# Patient Record
Sex: Female | Born: 1935
Health system: Southern US, Community
[De-identification: ages and names within clinical notes are randomized; demographics above are authoritative.]

## PROBLEM LIST (undated history)

## (undated) DIAGNOSIS — I10 Essential (primary) hypertension: Secondary | ICD-10-CM

## (undated) DIAGNOSIS — Z8619 Personal history of other infectious and parasitic diseases: Secondary | ICD-10-CM

## (undated) DIAGNOSIS — R351 Nocturia: Secondary | ICD-10-CM

## (undated) DIAGNOSIS — R3915 Urgency of urination: Secondary | ICD-10-CM

## (undated) DIAGNOSIS — E559 Vitamin D deficiency, unspecified: Secondary | ICD-10-CM

## (undated) DIAGNOSIS — E785 Hyperlipidemia, unspecified: Secondary | ICD-10-CM

## (undated) DIAGNOSIS — R102 Pelvic and perineal pain unspecified side: Secondary | ICD-10-CM

## (undated) DIAGNOSIS — M1990 Unspecified osteoarthritis, unspecified site: Secondary | ICD-10-CM

## (undated) DIAGNOSIS — Z973 Presence of spectacles and contact lenses: Secondary | ICD-10-CM

## (undated) DIAGNOSIS — Z860100 Personal history of colon polyps, unspecified: Secondary | ICD-10-CM

## (undated) DIAGNOSIS — M8580 Other specified disorders of bone density and structure, unspecified site: Secondary | ICD-10-CM

## (undated) DIAGNOSIS — R413 Other amnesia: Secondary | ICD-10-CM

## (undated) DIAGNOSIS — J309 Allergic rhinitis, unspecified: Secondary | ICD-10-CM

## (undated) DIAGNOSIS — I6523 Occlusion and stenosis of bilateral carotid arteries: Secondary | ICD-10-CM

## (undated) DIAGNOSIS — I480 Paroxysmal atrial fibrillation: Secondary | ICD-10-CM

## (undated) DIAGNOSIS — K219 Gastro-esophageal reflux disease without esophagitis: Secondary | ICD-10-CM

## (undated) DIAGNOSIS — K5790 Diverticulosis of intestine, part unspecified, without perforation or abscess without bleeding: Secondary | ICD-10-CM

## (undated) DIAGNOSIS — R350 Frequency of micturition: Secondary | ICD-10-CM

## (undated) DIAGNOSIS — Z8673 Personal history of transient ischemic attack (TIA), and cerebral infarction without residual deficits: Secondary | ICD-10-CM

## (undated) DIAGNOSIS — E611 Iron deficiency: Secondary | ICD-10-CM

## (undated) DIAGNOSIS — N3010 Interstitial cystitis (chronic) without hematuria: Secondary | ICD-10-CM

## (undated) HISTORY — PX: URETEROLITHOTOMY: SHX71

## (undated) HISTORY — DX: Iron deficiency: E61.1

## (undated) HISTORY — DX: Diverticulosis of intestine, part unspecified, without perforation or abscess without bleeding: K57.90

## (undated) HISTORY — DX: Other specified disorders of bone density and structure, unspecified site: M85.80

## (undated) HISTORY — DX: Vitamin D deficiency, unspecified: E55.9

## (undated) HISTORY — DX: Allergic rhinitis, unspecified: J30.9

## (undated) HISTORY — PX: OTHER SURGICAL HISTORY: SHX169

## (undated) HISTORY — PX: ABDOMINAL HYSTERECTOMY: SHX81

---

## 1997-07-25 ENCOUNTER — Emergency Department (HOSPITAL_COMMUNITY): Admission: EM | Admit: 1997-07-25 | Discharge: 1997-07-25 | Payer: Self-pay | Admitting: Emergency Medicine

## 1999-09-15 ENCOUNTER — Emergency Department (HOSPITAL_COMMUNITY): Admission: EM | Admit: 1999-09-15 | Discharge: 1999-09-15 | Payer: Self-pay | Admitting: Emergency Medicine

## 1999-09-15 ENCOUNTER — Encounter: Payer: Self-pay | Admitting: Emergency Medicine

## 2003-01-25 ENCOUNTER — Encounter: Payer: Self-pay | Admitting: Internal Medicine

## 2003-02-01 ENCOUNTER — Encounter: Payer: Self-pay | Admitting: Internal Medicine

## 2004-11-24 ENCOUNTER — Ambulatory Visit (HOSPITAL_COMMUNITY): Admission: RE | Admit: 2004-11-24 | Discharge: 2004-11-24 | Payer: Self-pay | Admitting: Urology

## 2004-11-24 ENCOUNTER — Ambulatory Visit (HOSPITAL_BASED_OUTPATIENT_CLINIC_OR_DEPARTMENT_OTHER): Admission: RE | Admit: 2004-11-24 | Discharge: 2004-11-24 | Payer: Self-pay | Admitting: Urology

## 2006-04-15 ENCOUNTER — Ambulatory Visit (HOSPITAL_BASED_OUTPATIENT_CLINIC_OR_DEPARTMENT_OTHER): Admission: RE | Admit: 2006-04-15 | Discharge: 2006-04-15 | Payer: Self-pay | Admitting: Urology

## 2006-04-15 HISTORY — PX: OTHER SURGICAL HISTORY: SHX169

## 2006-05-23 ENCOUNTER — Emergency Department (HOSPITAL_COMMUNITY): Admission: EM | Admit: 2006-05-23 | Discharge: 2006-05-23 | Payer: Self-pay | Admitting: Emergency Medicine

## 2006-05-23 ENCOUNTER — Encounter (INDEPENDENT_AMBULATORY_CARE_PROVIDER_SITE_OTHER): Payer: Self-pay | Admitting: *Deleted

## 2006-05-28 ENCOUNTER — Observation Stay (HOSPITAL_COMMUNITY): Admission: EM | Admit: 2006-05-28 | Discharge: 2006-05-29 | Payer: Self-pay | Admitting: Emergency Medicine

## 2007-01-06 HISTORY — PX: CATARACT EXTRACTION W/ INTRAOCULAR LENS  IMPLANT, BILATERAL: SHX1307

## 2007-01-17 ENCOUNTER — Encounter: Payer: Self-pay | Admitting: Internal Medicine

## 2007-01-17 ENCOUNTER — Ambulatory Visit (HOSPITAL_COMMUNITY): Admission: RE | Admit: 2007-01-17 | Discharge: 2007-01-17 | Payer: Self-pay | Admitting: Internal Medicine

## 2007-08-09 ENCOUNTER — Ambulatory Visit (HOSPITAL_COMMUNITY): Admission: RE | Admit: 2007-08-09 | Discharge: 2007-08-09 | Payer: Self-pay | Admitting: Internal Medicine

## 2007-12-06 ENCOUNTER — Telehealth: Payer: Self-pay | Admitting: Internal Medicine

## 2007-12-07 ENCOUNTER — Ambulatory Visit: Payer: Self-pay | Admitting: Gastroenterology

## 2007-12-07 DIAGNOSIS — R143 Flatulence: Secondary | ICD-10-CM

## 2007-12-07 DIAGNOSIS — K625 Hemorrhage of anus and rectum: Secondary | ICD-10-CM

## 2007-12-07 DIAGNOSIS — Z860100 Personal history of colon polyps, unspecified: Secondary | ICD-10-CM | POA: Insufficient documentation

## 2007-12-07 DIAGNOSIS — R141 Gas pain: Secondary | ICD-10-CM

## 2007-12-07 DIAGNOSIS — K644 Residual hemorrhoidal skin tags: Secondary | ICD-10-CM | POA: Insufficient documentation

## 2007-12-07 DIAGNOSIS — R634 Abnormal weight loss: Secondary | ICD-10-CM

## 2007-12-07 DIAGNOSIS — K219 Gastro-esophageal reflux disease without esophagitis: Secondary | ICD-10-CM | POA: Insufficient documentation

## 2007-12-07 DIAGNOSIS — K5900 Constipation, unspecified: Secondary | ICD-10-CM | POA: Insufficient documentation

## 2007-12-07 DIAGNOSIS — R110 Nausea: Secondary | ICD-10-CM

## 2007-12-07 DIAGNOSIS — R12 Heartburn: Secondary | ICD-10-CM

## 2007-12-07 DIAGNOSIS — R142 Eructation: Secondary | ICD-10-CM

## 2007-12-14 ENCOUNTER — Ambulatory Visit: Payer: Self-pay | Admitting: Internal Medicine

## 2007-12-16 ENCOUNTER — Encounter: Payer: Self-pay | Admitting: Internal Medicine

## 2007-12-21 ENCOUNTER — Telehealth: Payer: Self-pay | Admitting: Internal Medicine

## 2007-12-29 ENCOUNTER — Emergency Department (HOSPITAL_COMMUNITY): Admission: EM | Admit: 2007-12-29 | Discharge: 2007-12-29 | Payer: Self-pay | Admitting: Emergency Medicine

## 2008-01-17 DIAGNOSIS — K209 Esophagitis, unspecified without bleeding: Secondary | ICD-10-CM | POA: Insufficient documentation

## 2008-01-17 DIAGNOSIS — K648 Other hemorrhoids: Secondary | ICD-10-CM | POA: Insufficient documentation

## 2008-01-17 DIAGNOSIS — K5730 Diverticulosis of large intestine without perforation or abscess without bleeding: Secondary | ICD-10-CM | POA: Insufficient documentation

## 2008-01-17 DIAGNOSIS — D126 Benign neoplasm of colon, unspecified: Secondary | ICD-10-CM

## 2008-01-18 ENCOUNTER — Ambulatory Visit: Payer: Self-pay | Admitting: Internal Medicine

## 2008-01-18 DIAGNOSIS — R1013 Epigastric pain: Secondary | ICD-10-CM

## 2008-01-18 DIAGNOSIS — K3189 Other diseases of stomach and duodenum: Secondary | ICD-10-CM

## 2008-10-04 ENCOUNTER — Ambulatory Visit (HOSPITAL_BASED_OUTPATIENT_CLINIC_OR_DEPARTMENT_OTHER): Admission: RE | Admit: 2008-10-04 | Discharge: 2008-10-04 | Payer: Self-pay | Admitting: Urology

## 2009-03-26 DIAGNOSIS — D649 Anemia, unspecified: Secondary | ICD-10-CM | POA: Insufficient documentation

## 2009-05-02 ENCOUNTER — Ambulatory Visit (HOSPITAL_BASED_OUTPATIENT_CLINIC_OR_DEPARTMENT_OTHER): Admission: RE | Admit: 2009-05-02 | Discharge: 2009-05-02 | Payer: Self-pay | Admitting: Urology

## 2009-09-11 ENCOUNTER — Encounter (INDEPENDENT_AMBULATORY_CARE_PROVIDER_SITE_OTHER): Payer: Self-pay | Admitting: *Deleted

## 2009-09-17 ENCOUNTER — Observation Stay (HOSPITAL_COMMUNITY): Admission: RE | Admit: 2009-09-17 | Discharge: 2009-09-18 | Payer: Self-pay | Admitting: Urology

## 2009-09-17 HISTORY — PX: OTHER SURGICAL HISTORY: SHX169

## 2009-09-18 ENCOUNTER — Encounter (INDEPENDENT_AMBULATORY_CARE_PROVIDER_SITE_OTHER): Payer: Self-pay | Admitting: *Deleted

## 2010-01-03 ENCOUNTER — Telehealth: Payer: Self-pay | Admitting: Internal Medicine

## 2010-01-25 ENCOUNTER — Encounter: Payer: Self-pay | Admitting: Internal Medicine

## 2010-02-03 ENCOUNTER — Encounter: Payer: Self-pay | Admitting: Internal Medicine

## 2010-02-03 ENCOUNTER — Ambulatory Visit
Admission: RE | Admit: 2010-02-03 | Discharge: 2010-02-03 | Payer: Self-pay | Source: Home / Self Care | Attending: Internal Medicine | Admitting: Internal Medicine

## 2010-02-03 DIAGNOSIS — R109 Unspecified abdominal pain: Secondary | ICD-10-CM | POA: Insufficient documentation

## 2010-02-04 ENCOUNTER — Ambulatory Visit (HOSPITAL_COMMUNITY)
Admission: RE | Admit: 2010-02-04 | Discharge: 2010-02-04 | Payer: Self-pay | Source: Home / Self Care | Attending: Internal Medicine | Admitting: Internal Medicine

## 2010-02-06 NOTE — Progress Notes (Signed)
Summary: Sooner appt.  Phone Note Call from Patient Call back at [facility] Phone [phone]   Caller: Patient Call For: Dr. Marina Goodell Reason for Call: Talk to Nurse Summary of Call: having increased acid reflux...requesting sooner appt than 02-03-10 Initial call taken by: Karna Christmas,  January 03, 2010 11:39 AM  Follow-up for Phone Call        Spoke with patient to informed her she is scheduled for 02/03/10 @ 0830, but I will place her on Dr [facility] waiting list. Patient stated understanding. Follow-up by: Graciella Freer RN,  January 03, 2010 2:39 PM

## 2010-02-06 NOTE — Progress Notes (Signed)
Summary: "increased reflx"  Phone Note Call from Patient Call back at Home Phone [phone]   Caller: Patient Call For: Dr. Marina Goodell Reason for Call: Talk to Nurse Summary of Call: Burning and acid reflux....would like to discuss Initial call taken by: Karna Christmas,  January 03, 2010 8:19 AM  Follow-up for Phone Call        Dr.Perini put pt. on Protonix q.a.m. but it is not helping reflux.She stopped the Protonix and tried Zantac and it did not work so she resumed Protonix.Reflux increases  throughout the day but does   not bother her at night. Follow-up by: Teryl Lucy RN,  January 03, 2010 8:57 AM  Additional Follow-up for Phone Call Additional follow up Details #1::        Not sure that she even has reflux. Can make a routine f/u OV if she would like Additional Follow-up by: Hilarie Fredrickson MD,  January 03, 2010 9:30 AM    Additional Follow-up for Phone Call Additional follow up Details #2::    Given r.o.v. appt. Follow-up by: Teryl Lucy RN,  January 03, 2010 10:03 AM

## 2010-02-10 ENCOUNTER — Other Ambulatory Visit (AMBULATORY_SURGERY_CENTER): Payer: Medicare Other | Admitting: Internal Medicine

## 2010-02-10 ENCOUNTER — Telehealth (INDEPENDENT_AMBULATORY_CARE_PROVIDER_SITE_OTHER): Payer: Self-pay | Admitting: *Deleted

## 2010-02-10 ENCOUNTER — Encounter: Payer: Self-pay | Admitting: Internal Medicine

## 2010-02-10 DIAGNOSIS — K219 Gastro-esophageal reflux disease without esophagitis: Secondary | ICD-10-CM

## 2010-02-10 DIAGNOSIS — R109 Unspecified abdominal pain: Secondary | ICD-10-CM

## 2010-02-11 ENCOUNTER — Telehealth (INDEPENDENT_AMBULATORY_CARE_PROVIDER_SITE_OTHER): Payer: Self-pay | Admitting: *Deleted

## 2010-02-12 ENCOUNTER — Encounter: Payer: Self-pay | Admitting: Internal Medicine

## 2010-02-12 ENCOUNTER — Other Ambulatory Visit: Payer: Self-pay | Admitting: Internal Medicine

## 2010-02-12 NOTE — Assessment & Plan Note (Signed)
Summary: Abdominal burning   History of Present Illness Visit Type: Follow-up Consult Primary GI MD: Yancey Flemings MD Primary Provider: Rodrigo Ran, MD Requesting Provider: Rodrigo Ran, MD Chief Complaint: Patient c/o 2-3 months frequent reflux. She c/o excessive belching and epigastric burning. She c/o upper abdominal tenderness. History of Present Illness:   77 year old female with hypertension, cerebrovascular disease, kidney stones, anxiety disorder, and GERD. She presents today regarding "burning". She is accompanied by her daughter. The patient was seen in the office 2009/12/14for vague complaints of burning sensation, bloating, and constipation. She subsequently underwent colonoscopy and upper endoscopy. Colonoscopy revealed diverticulosis. MiraLax recommended for constipation. Upper endoscopy revealed mild gastritis. H. pylori positive, treated with Prevpac x1 week(stopped prematurely due to "funny feeling"). She now reports a several month history of burning sensation that begins in the chest and migrates to the abdomen and eventually resulting in dysuria. She sees a urologist for "cystitis". She states that the discomfort is worse after meals. She has lost 7 pounds since 12/19/22. Her husband died this past 09-19-2022. She also complains of bloating and belching. Constipation continues, but is managed with MiraLax. No bleeding. When asked about anxiety and depression, they state that she may have these symptoms due to not feeling well. She's been on multiple PPI is without improvement until most recently when Dexilant seems to help some. No dysphagia. Laboratories from 2022-09-19 revealed a normal basic embolic pellets of for glucose of 146 and normal CBC except for platelets 129,000.   GI Review of Systems    Reports abdominal pain, acid reflux, and  belching.     Location of  Abdominal pain: upper abdomen.    Denies bloating, chest pain, dysphagia with liquids, dysphagia with solids,  heartburn, loss of appetite, nausea, vomiting, vomiting blood, weight loss, and  weight gain.      Reports constipation.     Denies anal fissure, black tarry stools, change in bowel habit, diarrhea, diverticulosis, fecal incontinence, heme positive stool, hemorrhoids, irritable bowel syndrome, jaundice, light color stool, liver problems, rectal bleeding, and  rectal pain. Preventive Screening-Counseling & Management  Caffeine-Diet-Exercise     Does Patient Exercise: no    Current Medications (verified): 1)  Avapro 75 Mg Tabs (Irbesartan) .... Once Daily 2)  Premarin 1.25 Mg Tabs (Estrogens Conjugated) .... Once Daily 3)  Vitamin D 56387 Unit Caps (Ergocalciferol) .... Take 1 Tablet Weekly As Needed 4)  Vesicare 10 Mg Tabs (Solifenacin Succinate) .... Take 1 Tablet 5)  Dexilant 60 Mg Cpdr (Dexlansoprazole) .... Take 1 Tablet By Mouth Once A Day 6)  Phenazopyridine Hcl 200 Mg Tabs (Phenazopyridine Hcl) .... Take 1 Tablet By Mouth Every 6 Hours As Needed 7)  Lorazepam 1 Mg Tabs (Lorazepam) .... Take 1 Tablet By Mouth At Bedtime 8)  Tylenol Pm Extra Strength 500-25 Mg Tabs (Diphenhydramine-Apap (Sleep)) .... Take 1 Tab By Mouth At Bedtime As Needed For Sleep  Allergies (verified): 1)  ! * Gabapentin  Past History:  Past Medical History: Anxiety Disorder Hypertension Kidney Stones Stroke GERD  Past Surgical History: Hysterectomy Kidney stone removal Eye Surgery-Left  Social History: Occupation: Retired Patient has never smoked.  Alcohol Use - no Illicit Drug Use - no Patient does not get regular exercise.  Does Patient Exercise:  no  Review of Systems       The patient complains of allergy/sinus, sleeping problems, swelling of feet/legs, urination changes/pain, and voice change.  The patient denies anemia, anxiety-new, arthritis/joint pain, back pain, blood in urine, breast  changes/lumps, change in vision, confusion, cough, coughing up blood, depression-new, fainting,  fatigue, fever, headaches-new, hearing problems, heart murmur, heart rhythm changes, itching, menstrual pain, muscle pains/cramps, night sweats, pregnancy symptoms, shortness of breath, skin rash, sore throat, swollen lymph glands, thirst - excessive , urination - excessive , urine leakage, and vision changes.    Vital Signs:  Patient profile:   77 year old female Height:      64 inches Weight:      128.13 pounds BMI:     22.07 BSA:     1.62 Pulse rate:   84 / minute Pulse rhythm:   regular BP sitting:   132 / 76  (left arm)  Vitals Entered By: Lamona Curl CMA (AAMA) (February 03, 2010 10:12 AM)   Physical Exam  General:  Well developed, well nourished, no acute distress. Head:  Normocephalic and atraumatic. Eyes:  PERRLA, no icterus. Mouth:  No deformity or lesions. Neck:  Supple; no masses or thyromegaly. Lungs:  Clear throughout to auscultation. Heart:  Regular rate and rhythm; no murmurs, rubs,  or bruits. Abdomen:  Soft, nontender and nondistended. No masses, hepatosplenomegaly or hernias noted. Normal bowel sounds. Msk:  Symmetrical with no gross deformities. Normal posture. Pulses:  Normal pulses noted. Extremities:  No clubbing, cyanosis, edema or deformities noted. Neurologic:  Alert and  oriented x4. Skin:  Intact without significant lesions or rashes. Psych:  Alert and cooperative. Normal mood and affect.   Impression & Recommendations:  Problem # 1:  ABDOMINAL PAIN, UNSPECIFIED SITE (ICD-789.00) vague abdominal burning sensation, worse with meals. Etiology unclear. Maybe somewhat improved on Dexilant. Recent weight loss associated with food avoidance.  Plan: #1. Abdominal ultrasound to rule out gallstones #2. Upper endoscopy rule out gastric ulcer #3. Continue Dexilant as this seems to be helping some #4. Consider contrast enhanced CT scan with fine cuts through the pancreas if the above workup negative and symptoms continue  Problem # 2:  CONSTIPATION  (ICD-564.00) Assessment: Unchanged continue MiraLax p.r.n.  Problem # 3:  ADENOMATOUS COLONIC POLYP (ICD-211.3) negative most recent followup examination December 2009. Due for followup around December 2014 if medically fit  Other Orders: EGD (EGD) Ultrasound Abdomen (UAS)  Patient Instructions: 1)  EGD [facility] 02/10/10 10:30 am arrive at 9:30 am 2)  Upper Endoscopy brochure given.  3)  Abdominal Ultrasound scheduled at [facility] 02/04/10 9:00 am arrive at 8:45 am. 4)  Nothing to eat or drink after midnight. 5)  Copy sent to : Rodrigo Ran, MD 6)  The medication list was reviewed and reconciled.  All changed / newly prescribed medications were explained.  A complete medication list was provided to the patient / caregiver.

## 2010-02-12 NOTE — Consult Note (Signed)
Summary: [facility]   Imported By: Sherian Rein 02/04/2010 10:06:45  _____________________________________________________________________  External Attachment:    Type:   Image     Comment:   External Document

## 2010-02-12 NOTE — Letter (Signed)
Summary: EGD Instructions  [facility]  [address] [place], [place] [address]   Phone: [phone]  Fax: [phone]       Christine [name]    04-Jun-1933    MRN: [id number]       Procedure Day /Date:MONDAY, 02/10/10     Arrival Time: 9:30 AM     Procedure Time:10:30 AM     Location of Procedure:                    [facility] (4th Floor)   PREPARATION FOR ENDOSCOPY   On 02/10/10 THE DAY OF THE PROCEDURE:  1.   No solid foods, milk or milk products are allowed after midnight the night before your procedure.  2.   Do not drink anything colored red or purple.  Avoid juices with pulp.  No orange juice.  3.  You may drink clear liquids until 8:30 AM, which is 2 hours before your procedure.                                                                                                CLEAR LIQUIDS INCLUDE: Water Jello Ice Popsicles Tea (sugar ok, no milk/cream) Powdered fruit flavored drinks Coffee (sugar ok, no milk/cream) Gatorade Juice: apple, white grape, white cranberry  Lemonade Clear bullion, consomm, broth Carbonated beverages (any kind) Strained chicken noodle soup Hard Candy   MEDICATION INSTRUCTIONS  Unless otherwise instructed, you should take regular prescription medications with a small sip of water as early as possible the morning of your procedure.          OTHER INSTRUCTIONS  You will need a responsible adult at least 75 years of age to accompany you and drive you home.   This person must remain in the waiting room during your procedure.  Wear loose fitting clothing that is easily removed.  Leave jewelry and other valuables at home.  However, you may wish to bring a book to read or an iPod/MP3 player to listen to music as you wait for your procedure to start.  Remove all body piercing jewelry and leave at home.  Total time from sign-in until discharge is approximately 2-3 hours.  You should go home directly after  your procedure and rest.  You can resume normal activities the day after your procedure.  The day of your procedure you should not:   Drive   Make legal decisions   Operate machinery   Drink alcohol   Return to work  You will receive specific instructions about eating, activities and medications before you leave.    The above instructions have been reviewed and explained to me by   _______________________    I fully understand and can verbalize these instructions _____________________________ Date _________

## 2010-02-18 ENCOUNTER — Other Ambulatory Visit: Payer: Self-pay | Admitting: Internal Medicine

## 2010-02-18 ENCOUNTER — Other Ambulatory Visit: Payer: Medicare Other

## 2010-02-18 ENCOUNTER — Encounter (INDEPENDENT_AMBULATORY_CARE_PROVIDER_SITE_OTHER): Payer: Self-pay | Admitting: *Deleted

## 2010-02-18 ENCOUNTER — Ambulatory Visit (INDEPENDENT_AMBULATORY_CARE_PROVIDER_SITE_OTHER)
Admission: RE | Admit: 2010-02-18 | Discharge: 2010-02-18 | Disposition: A | Discharge status: Home / Self Care | Payer: Medicare Other | Source: Ambulatory Visit | Attending: Internal Medicine | Admitting: Internal Medicine

## 2010-02-18 DIAGNOSIS — R109 Unspecified abdominal pain: Secondary | ICD-10-CM

## 2010-02-18 MED ORDER — IOHEXOL 300 MG/ML  SOLN
100.0000 mL | Freq: Once | INTRAMUSCULAR | Status: AC | PRN
  Administered 2010-02-18: 90 mL via INTRAVENOUS

## 2010-02-20 NOTE — Miscellaneous (Signed)
Summary: C.T.  order for with and without contrast  Clinical Lists Changes  Orders: Added new Test order of CT Abdomen/Pelvis w & w/o Contrast (CT A/P w&w/o Cont) - Signed

## 2010-02-20 NOTE — Procedures (Signed)
Summary: Endoscopy   EGD  Procedure date:  02/10/2010  Findings:      Location: [facility]   ENDOSCOPY PROCEDURE REPORT  PATIENT:  Christine [name], Christine [name]  MR#:  [id number] BIRTHDATE:   December 01, 1933, 76 yrs. old   GENDER:   female  ENDOSCOPIST:   Wilhemina Bonito. Eda Keys, MD Referred by: Office   PROCEDURE DATE:  02/10/2010 PROCEDURE:  EGD, diagnostic 62952 ASA CLASS:   Class II INDICATIONS: abdominal pain   MEDICATIONS:    Fentanyl 50 mcg IV, Versed 6 mg IV TOPICAL ANESTHETIC:   Exactacain Spray  DESCRIPTION OF PROCEDURE:   After the risks benefits and alternatives of the procedure were thoroughly explained, informed consent was obtained.  The LB [id number] endoscope was introduced through the mouth and advanced to the second portion of the duodenum, without limitations.  The instrument was slowly withdrawn as the mucosa was fully examined. <<PROCEDUREIMAGES>>          <<OLD IMAGES>>  The upper, middle, and distal third of the esophagus were carefully inspected and no abnormalities were noted. The z-line was well seen at the GEJ. The endoscope was pushed into the fundus which was normal including a retroflexed view. The antrum,gastric body, first and second part of the duodenum were unremarkable.    Retroflexed views revealed no abnormalities.    The scope was then withdrawn from the patient and the procedure completed.  COMPLICATIONS:   None ENDOSCOPIC IMPRESSION:  1) Normal EGD  2) GERD  RECOMMENDATIONS:  1) Continue Dexilant  2) My office will arrange for you to have a contrast enhanced CT scan of your abdomen and pelvis with fine cuts through the pancreas "evaluate abdominal pain and weight loss".  _______________________________ Wilhemina Bonito. Eda Keys, MD    CC: Rodrigo Ran, MD; The Patient

## 2010-02-20 NOTE — Progress Notes (Signed)
  Phone Note Outgoing Call   Action Taken: Information Sent Reason for Call: Get patient information Details for Reason: Phone # of carepartner Summary of Call: Please call Gunnar Fusi..the patient's caretaker when scheduling CT as she needs to plan her schedule around it.    It is [phone]. Initial call taken by: Clide Cliff RN,  February 10, 2010 11:37 AM

## 2010-02-20 NOTE — Progress Notes (Signed)
  Phone Note Outgoing Call   Summary of Call: C.T.ordered after procedure yesterday and message left for pt's. caregiver Gunnar Fusi at [phone] to please call me back to schedule the c.t. Initial call taken by: Teryl Lucy RN,  February 11, 2010 10:06 AM  Follow-up for Phone Call        [name] (daughter) returning Taimur Fier's call from today. Going out of town on [place]. Call her back at [phone] Follow-up by: Karna Christmas,  February 11, 2010 4:00 PM  Additional Follow-up for Phone Call Additional follow up Details #1::        Pt. scheduled for C.T. of abd./pelvis at [facility] on2/14/12 with pancreatic protocol.Appt. is at 1:30 .Will have to be there at 1:00 p.m. N.p.o. 4 hrs. prior.Will need stat bun/creat. in the a.m. caregiver Gunnar Fusi ntfd. and instructed. Additional Follow-up by: Teryl Lucy RN,  February 12, 2010 9:09 AM

## 2010-03-03 DIAGNOSIS — R209 Unspecified disturbances of skin sensation: Secondary | ICD-10-CM | POA: Insufficient documentation

## 2010-03-20 LAB — ABO/RH: ABO/RH(D): A POS

## 2010-03-20 LAB — CBC
HCT: 40 % (ref 36.0–46.0)
Hemoglobin: 13.4 g/dL (ref 12.0–15.0)
MCH: 33 pg (ref 26.0–34.0)
MCHC: 33.5 g/dL (ref 30.0–36.0)
MCV: 98.5 fL (ref 78.0–100.0)

## 2010-03-20 LAB — TYPE AND SCREEN
ABO/RH(D): A POS
Antibody Screen: NEGATIVE

## 2010-03-20 LAB — BASIC METABOLIC PANEL
BUN: 11 mg/dL (ref 6–23)
BUN: 11 mg/dL (ref 6–23)
BUN: 15 mg/dL (ref 6–23)
CO2: 30 mEq/L (ref 19–32)
Calcium: 8.5 mg/dL (ref 8.4–10.5)
Chloride: 102 mEq/L (ref 96–112)
Chloride: 106 mEq/L (ref 96–112)
Chloride: 110 mEq/L (ref 96–112)
Creatinine, Ser: 1 mg/dL (ref 0.4–1.2)
Glucose, Bld: 146 mg/dL — ABNORMAL HIGH (ref 70–99)
Glucose, Bld: 96 mg/dL (ref 70–99)
Potassium: 4.1 mEq/L (ref 3.5–5.1)
Potassium: 4.4 mEq/L (ref 3.5–5.1)
Sodium: 137 mEq/L (ref 135–145)

## 2010-03-20 LAB — PROTIME-INR
INR: 0.95 (ref 0.00–1.49)
Prothrombin Time: 12.9 seconds (ref 11.6–15.2)

## 2010-03-20 LAB — APTT: aPTT: 30 seconds (ref 24–37)

## 2010-03-20 LAB — SURGICAL PCR SCREEN: MRSA, PCR: NEGATIVE

## 2010-03-20 LAB — HEMOGLOBIN AND HEMATOCRIT, BLOOD
HCT: 35.4 % — ABNORMAL LOW (ref 36.0–46.0)
Hemoglobin: 10.4 g/dL — ABNORMAL LOW (ref 12.0–15.0)

## 2010-03-25 LAB — POCT I-STAT 4, (NA,K, GLUC, HGB,HCT)
Glucose, Bld: 85 mg/dL (ref 70–99)
Potassium: 4.4 mEq/L (ref 3.5–5.1)

## 2010-04-11 LAB — POCT I-STAT 4, (NA,K, GLUC, HGB,HCT)
Hemoglobin: 12.9 g/dL (ref 12.0–15.0)
Sodium: 138 mEq/L (ref 135–145)

## 2010-05-20 NOTE — Op Note (Signed)
NAME:  [name], Christine               ACCOUNT NO.:  [id number]   MEDICAL RECORD NO.:  [id number]          PATIENT TYPE:  AMB   LOCATION:  DAY                          FACILITY:  [facility]   PHYSICIAN:  Sigmund I. Patsi Sears, M.D.DATE OF BIRTH:  12-23-1933   DATE OF PROCEDURE:  DATE OF DISCHARGE:  05/29/2006                               OPERATIVE REPORT   PREOPERATIVE DIAGNOSIS:  Right ureteral stone.   POSTOPERATIVE DIAGNOSIS:  Right ureteral stone.   OPERATION:  1. Cystourethroscopy.  2. Right retrograde pyelogram with interpretation.  3. Right ureteroscopy.  4. Basket extraction, right ureteral calculus.   SURGEON:  Sigmund I. Patsi Sears, M.D.   ANESTHESIA:  General LMA.   PREPARATION:  After appropriate preanesthesia, the patient was brought  to the operating room and placed on the operating table in the dorsal  supine position, where general LMA anesthesia was introduced.  She was  then replaced in dorsal lithotomy position, where the pubis was prepped  with Betadine solution and draped in the usual fashion.   REVIEW OF HISTORY:  Ms. Armstead is a 77 year old female with known right  ureteral calculus, which she has not able to pass.  Because of intense  right ureteral colic, the patient was admitted for observation to [facility] and is now for ureteroscopic basket extraction of stone  this morning.   PROCEDURE:  With the patient in supine position, general LMA anesthesia  was administered.  The patient was then replaced in dorsal lithotomy  position, where the pubis was prepped with Betadine solution and draped  in the usual fashion.   Cystourethroscopy was accomplished and shows a normal-appearing bladder  with an edematous right ureteral orifice and right hemitrigone.  The  right orifice was identified and a right retrograde pyelogram was  performed with no definite identification of stone, even under  magnification.  Right ureteroscopy was then accomplished  with the short  ureteroscope.  It is noted on retrograde pyelography that there was some  dilation of the upper ureter.  Ureteroscopy was accomplished and showed  that there was definite edema in the intramural portion of the ureter  and some blood-streaked ureter was also identified.  Mid ureter  ureteroscopy to upper ureter ureteroscopy yielded the stone, which  appeared as a spur, measuring 2-3 mm.  This was basket-extracted using  the ureteroscope.  It was elected to give the patient Toradol but to  avoid a double-J catheterization because once ureteroscope was  negotiated past the edematous area of the intramural ureter, the  ureteroscope passed freely.  Repeat ureteroscopy was  accomplished to ensure that there were no further stones, and none were  identified.  The ureteroscope passed freely past the intramural portion  of the ureter at this time.   The patient was awakened, given IV Toradol, and taken to the recovery  room in good condition.      Sigmund I. Patsi Sears, M.D.  Electronically Signed     SIT/MEDQ  D:  05/29/2006  T:  05/29/2006  Job:  [id number]   cc:   Bertram Millard. Dahlstedt, M.D.  Fax:  [phone]

## 2010-05-20 NOTE — H&P (Signed)
NAME:  Christine [name], Christine [name]               ACCOUNT NO.:  [id number]   MEDICAL RECORD NO.:  [id number]          PATIENT TYPE:  INP   LOCATION:  5741                         FACILITY:  [facility]   PHYSICIAN:  Bertram Millard. Dahlstedt, M.D.DATE OF BIRTH:  1933/10/28   DATE OF ADMISSION:  05/28/2006  DATE OF DISCHARGE:                              HISTORY & PHYSICAL   REASON FOR ADMISSION:  Kidney stone.   BRIEF HISTORY:  [age]-old female with a prior history of  urolithiasis, admitted this morning for persistent pain due to her right  distal ureteral stone.  She had been having intermittent pain for quite  a few weeks.  She has been on Flomax for medical expulsive therapy  with this not working.  She has had 2 emergency room visits over the  past week or so, and presents at this time for admission for pain  management.   PAST MEDICAL HISTORY:  Significant for:  1. Prior stone extractions.  2. A left ureteroscopy, which failed to show any stones within the      past 4 months.  3. Hypertension.  4. She has had a hysterectomy.  5. She has hypercholesterolemia.   MEDICATIONS:  Include Avapro, Cipro, Flomax, oxycodone, Premarin,  Vicodin, p.r.n. Zocor.   NO KNOWN DRUG ALLERGIES.   REVIEW OF SYSTEMS:  Is significant for intermittent flank pain.  No  fevers or chills.  She has intermittent low urinary tract symptoms.  No  gross hematuria.  No fever or chills.   PHYSICAL EXAMINATION:  GENERAL:  Revealed a pleasant, elderly female.  At the time of this examination, she was comfortable reclining.  VITAL SIGNS:  Blood pressure 149/80.  Pulse 73.  Temperature afebrile.  LUNGS:  Clear.  HEART:  Normal rate and rhythm.  ABDOMEN:  Mild right lower quadrant tenderness.  No rebound or guarding.  SKIN:  Warm and dry.   BMET revealed a glucose of 111, but otherwise normal.  Hemoglobin 15.0.  Urinalysis, large blood.  No evidence of infection.   I reviewed the patient's CT urogram from earlier  and the KUB.  This  shows a 1-2 mm right distal ureteral stone with moderate hydronephrosis.  There were no other renal or ureteral calculi noted.   IMPRESSION:  Persistent right ureteral calculus.   PLAN:  1. Admit for pain management.  2. I have talked with the patient this evening, and at this point,      with a longstanding history of the stone not passing, even with      medical expulsive therapy, we will proceed with ureteroscopy and      stone extraction in the morning.  3. We will get this arranged to be done at [facility], with the      [facility] set to transport the patient.  She should be able to go      home directly after that procedure.      Bertram Millard. Dahlstedt, M.D.  Electronically Signed     SMD/MEDQ  D:  05/28/2006  T:  05/29/2006  Job:  [id number]

## 2010-05-23 NOTE — Op Note (Signed)
NAME:  Christine [name], Christine [name]               ACCOUNT NO.:  [id number]   MEDICAL RECORD NO.:  [id number]          PATIENT TYPE:  AMB   LOCATION:  [facility]                         FACILITY:  [facility]   PHYSICIAN:  Bertram Millard. Dahlstedt, M.D.DATE OF BIRTH:  [birth year]   DATE OF PROCEDURE:  04/15/2006  DATE OF DISCHARGE:                               OPERATIVE REPORT   PREOPERATIVE DIAGNOSIS:  Left flank pain with history of stone.   POSTOPERATIVE DIAGNOSIS:  Left flank pain with history of stone.   PRINCIPAL PROCEDURE:  1. Left ureteroscopy.  2. Ureteral dilation.   SURGEON:  Bertram Millard. Dahlstedt, M.D.   ANESTHESIA:  General with LMA.   COMPLICATIONS:  None.   BRIEF HISTORY:  This 77 year old female has had a history of ureteral  stones.  She originally presented a few weeks ago with left flank pain  and bladder irritability.  She had a UTI at that time.  She was treated  for her UTI.  Symptoms did not resolve; she had persistent lower left-  sided discomfort and urgency and frequency.  Her last urinalysis was  clear.  CT urogram was performed earlier this week; this showed a  question of a left distal ureteral stone.  Although the final reading  revealed probable phlebolith, I reviewed this CT scan several times and  it looks like the one calcification is in a location consistent with the  distal ureter.  The patient was fairly insistent on having a procedure  done to alleviate her stone pain.  Risks and complications of the  procedure were discussed; alternatives of watchful waiting were also  discussed.  She has desire to proceed.   DESCRIPTION OF PROCEDURE:  The patient was identified in the holding  area, IV antibiotics were administered and the surgical side marked.  She was taken to the operating room, where a general anesthetic was  administered using the LMA.  She was placed in dorsal lithotomy  position.  Genitalia and perineum were prepped and draped.  Ureteroscope  was passed into  the bladder.  I was unable to enter the ureter.  I then  passed a guidewire and over the guidewire I dilated the distal ureter to  12-French with the ureteral dilator sheath (inner core).  I then passed  a 6-French ureteroscope to the upper ureter as far as it would go --  basically up to the renal pelvis.  No stones were encountered.  I did  see has a ureteral stricture towards the distal ureter; this was  obviously dilated with the dilator  sheath.  The scope was removed.  Rigid cystoscopy was then performed.  The bladder appeared normal.  The bladder was drained and the procedure  terminated.  She was given 15 mg of Toradol IV.  She will follow up with  me in approximately 1 week.  She was discharged on Urelle and Cipro 250  mg b.i.d. for 5 days.      Bertram Millard. Dahlstedt, M.D.  Electronically Signed     SMD/MEDQ  D:  04/15/2006  T:  04/15/2006  Job:  [id number]

## 2010-05-23 NOTE — Op Note (Signed)
NAME:  Christine [name], Christine [name]               ACCOUNT NO.:  [id number]   MEDICAL RECORD NO.:  [id number]          PATIENT TYPE:  AMB   LOCATION:  [facility]                         FACILITY:  [facility]   PHYSICIAN:  Bertram Millard. Dahlstedt, M.D.DATE OF BIRTH:  [birth year]   DATE OF PROCEDURE:  11/24/2004  DATE OF DISCHARGE:                                 OPERATIVE REPORT   PREOPERATIVE DIAGNOSIS:  History of left ureteral stone.   POSTOPERATIVE DIAGNOSIS:  Left ureteral stone.   SURGICAL PROCEDURES:  Cysto, left retrograde ureteral pyelogram, left  ureteroscopy with extraction of ureteral calculus.   SURGEON:  Bertram Millard. Dahlstedt, M.D.   ANESTHESIA:  General with LMA.   COMPLICATIONS:  None.   BRIEF HISTORY:  This middle-aged female presented to me several weeks ago  with a left ureteral stone. She was moderately symptomatic. We put her on  alpha blockers to help transit  the stone. Unfortunately, it is been unsuccessful and she remains  symptomatic. She called last week desiring stone extraction. She is aware of  the risks and complications of stone extraction/ureteroscopy and desires to  proceed.   DESCRIPTION OF PROCEDURE:  The patient was identified properly in the  holding area, administered IV antibiotics and taken to the operating room  where general anesthetic was administered using LMA. She was placed in the  dorsal lithotomy position. Genitalia and perineum were prepped and draped.  The cystoscope was advanced into her bladder which appeared normal. There  was moderate cystocele noted. The left ureteral orifice was cannulated with  a 6 open-ended catheter. Retrograde was performed. I saw no specific filling  defects, but she did have some widened ureter above the mid ureter. I then  passed a ureteroscope through her urethra and up into the left ureter. A  flat small stone was encountered in the mid ureter, grasped and extracted.  No other ureteral stones were seen. At this point, the  bladder was drained  and the procedure terminated. She was administered 30 mg of Toradol  intravenously. She was awakened and taken PACU in stable condition.      Bertram Millard. Dahlstedt, M.D.  Electronically Signed     SMD/MEDQ  D:  11/24/2004  T:  11/25/2004  Job:  [id number]

## 2010-10-09 LAB — COMPREHENSIVE METABOLIC PANEL
Alkaline Phosphatase: 32 U/L — ABNORMAL LOW (ref 39–117)
BUN: 10 mg/dL (ref 6–23)
CO2: 26 mEq/L (ref 19–32)
Chloride: 103 mEq/L (ref 96–112)
GFR calc non Af Amer: >60 mL/min (ref >60)
Glucose, Bld: 109 mg/dL — ABNORMAL HIGH (ref 70–99)
Potassium: 3.6 mEq/L (ref 3.5–5.1)
Total Bilirubin: 0.5 mg/dL (ref 0.3–1.2)
Total Protein: 5.8 g/dL — ABNORMAL LOW (ref 6.0–8.3)

## 2010-10-09 LAB — URINALYSIS, ROUTINE W REFLEX MICROSCOPIC
Nitrite: NEGATIVE
Protein, ur: NEGATIVE mg/dL
Specific Gravity, Urine: 1.006 (ref 1.005–1.030)
Urobilinogen, UA: 0.2 mg/dL (ref 0.0–1.0)

## 2010-10-09 LAB — DIFFERENTIAL
Basophils Absolute: 0 10*3/uL (ref 0.0–0.1)
Basophils Relative: 0 % (ref 0–1)
Eosinophils Absolute: 0 10*3/uL (ref 0.0–0.7)
Neutro Abs: 6.6 10*3/uL (ref 1.7–7.7)
Neutrophils Relative %: 74 % (ref 43–77)

## 2010-10-09 LAB — CBC
HCT: 39.2 % (ref 36.0–46.0)
Hemoglobin: 12.8 g/dL (ref 12.0–15.0)
RBC: 4.08 MIL/uL (ref 3.87–5.11)
RDW: 13.9 % (ref 11.5–15.5)

## 2010-10-09 LAB — POCT CARDIAC MARKERS: Myoglobin, poc: 33.3 ng/mL (ref 12–200)

## 2011-01-12 DIAGNOSIS — N3010 Interstitial cystitis (chronic) without hematuria: Secondary | ICD-10-CM | POA: Diagnosis not present

## 2011-01-13 DIAGNOSIS — N3010 Interstitial cystitis (chronic) without hematuria: Secondary | ICD-10-CM | POA: Diagnosis not present

## 2011-01-16 DIAGNOSIS — N3010 Interstitial cystitis (chronic) without hematuria: Secondary | ICD-10-CM | POA: Diagnosis not present

## 2011-01-19 DIAGNOSIS — L219 Seborrheic dermatitis, unspecified: Secondary | ICD-10-CM | POA: Diagnosis not present

## 2011-01-19 DIAGNOSIS — D485 Neoplasm of uncertain behavior of skin: Secondary | ICD-10-CM | POA: Diagnosis not present

## 2011-01-19 DIAGNOSIS — L570 Actinic keratosis: Secondary | ICD-10-CM | POA: Diagnosis not present

## 2011-01-28 DIAGNOSIS — N3010 Interstitial cystitis (chronic) without hematuria: Secondary | ICD-10-CM | POA: Diagnosis not present

## 2011-01-29 DIAGNOSIS — R3915 Urgency of urination: Secondary | ICD-10-CM | POA: Diagnosis not present

## 2011-01-29 DIAGNOSIS — R300 Dysuria: Secondary | ICD-10-CM | POA: Diagnosis not present

## 2011-01-29 DIAGNOSIS — N3010 Interstitial cystitis (chronic) without hematuria: Secondary | ICD-10-CM | POA: Diagnosis not present

## 2011-01-29 DIAGNOSIS — R109 Unspecified abdominal pain: Secondary | ICD-10-CM | POA: Diagnosis not present

## 2011-02-16 ENCOUNTER — Other Ambulatory Visit: Payer: Self-pay | Admitting: Urology

## 2011-02-18 ENCOUNTER — Encounter (HOSPITAL_BASED_OUTPATIENT_CLINIC_OR_DEPARTMENT_OTHER): Payer: Self-pay | Admitting: *Deleted

## 2011-02-19 ENCOUNTER — Encounter (HOSPITAL_BASED_OUTPATIENT_CLINIC_OR_DEPARTMENT_OTHER): Payer: Self-pay | Admitting: *Deleted

## 2011-02-20 ENCOUNTER — Encounter (HOSPITAL_BASED_OUTPATIENT_CLINIC_OR_DEPARTMENT_OTHER): Payer: Self-pay | Admitting: *Deleted

## 2011-02-20 NOTE — Progress Notes (Signed)
NPO AFTER MN. ARRIVES AT 1045. NEEDS ISTAT AND EKG. WILL TAKE PRILOSEC AM OF SURG. W/ SIP OF WATER

## 2011-02-24 ENCOUNTER — Ambulatory Visit (HOSPITAL_BASED_OUTPATIENT_CLINIC_OR_DEPARTMENT_OTHER)
Admission: RE | Admit: 2011-02-24 | Discharge: 2011-02-24 | Disposition: A | Discharge status: Home / Self Care | Payer: Medicare Other | Source: Ambulatory Visit | Attending: Urology | Admitting: Urology

## 2011-02-24 ENCOUNTER — Encounter (HOSPITAL_BASED_OUTPATIENT_CLINIC_OR_DEPARTMENT_OTHER): Payer: Self-pay | Admitting: Anesthesiology

## 2011-02-24 ENCOUNTER — Encounter (HOSPITAL_BASED_OUTPATIENT_CLINIC_OR_DEPARTMENT_OTHER)
Admission: RE | Disposition: A | Discharge status: Home / Self Care | Payer: Self-pay | Source: Ambulatory Visit | Attending: Urology

## 2011-02-24 ENCOUNTER — Other Ambulatory Visit: Payer: Self-pay

## 2011-02-24 ENCOUNTER — Encounter (HOSPITAL_BASED_OUTPATIENT_CLINIC_OR_DEPARTMENT_OTHER): Payer: Self-pay | Admitting: *Deleted

## 2011-02-24 ENCOUNTER — Ambulatory Visit (HOSPITAL_BASED_OUTPATIENT_CLINIC_OR_DEPARTMENT_OTHER): Payer: Medicare Other | Admitting: Anesthesiology

## 2011-02-24 DIAGNOSIS — Z8673 Personal history of transient ischemic attack (TIA), and cerebral infarction without residual deficits: Secondary | ICD-10-CM | POA: Diagnosis not present

## 2011-02-24 DIAGNOSIS — N3010 Interstitial cystitis (chronic) without hematuria: Secondary | ICD-10-CM | POA: Diagnosis not present

## 2011-02-24 DIAGNOSIS — I10 Essential (primary) hypertension: Secondary | ICD-10-CM | POA: Insufficient documentation

## 2011-02-24 DIAGNOSIS — N949 Unspecified condition associated with female genital organs and menstrual cycle: Secondary | ICD-10-CM | POA: Diagnosis not present

## 2011-02-24 DIAGNOSIS — K219 Gastro-esophageal reflux disease without esophagitis: Secondary | ICD-10-CM | POA: Insufficient documentation

## 2011-02-24 DIAGNOSIS — Z7982 Long term (current) use of aspirin: Secondary | ICD-10-CM | POA: Diagnosis not present

## 2011-02-24 DIAGNOSIS — Z79899 Other long term (current) drug therapy: Secondary | ICD-10-CM | POA: Insufficient documentation

## 2011-02-24 DIAGNOSIS — E7800 Pure hypercholesterolemia, unspecified: Secondary | ICD-10-CM | POA: Insufficient documentation

## 2011-02-24 HISTORY — DX: Interstitial cystitis (chronic) without hematuria: N30.10

## 2011-02-24 HISTORY — DX: Pelvic and perineal pain unspecified side: R10.20

## 2011-02-24 HISTORY — DX: Urgency of urination: R39.15

## 2011-02-24 HISTORY — DX: Unspecified osteoarthritis, unspecified site: M19.90

## 2011-02-24 HISTORY — DX: Gastro-esophageal reflux disease without esophagitis: K21.9

## 2011-02-24 HISTORY — DX: Nocturia: R35.1

## 2011-02-24 HISTORY — DX: Frequency of micturition: R35.0

## 2011-02-24 HISTORY — PX: CYSTO WITH HYDRODISTENSION: SHX5453

## 2011-02-24 HISTORY — DX: Pelvic and perineal pain: R10.2

## 2011-02-24 HISTORY — DX: Hyperlipidemia, unspecified: E78.5

## 2011-02-24 HISTORY — DX: Essential (primary) hypertension: I10

## 2011-02-24 LAB — POCT I-STAT 4, (NA,K, GLUC, HGB,HCT)
Glucose, Bld: 82 mg/dL (ref 70–99)
HCT: 33 % — ABNORMAL LOW (ref 36.0–46.0)
Hemoglobin: 11.2 g/dL — ABNORMAL LOW (ref 12.0–15.0)
Potassium: 3.7 mEq/L (ref 3.5–5.1)
Sodium: 145 mEq/L (ref 135–145)

## 2011-02-24 SURGERY — CYSTOSCOPY, WITH BLADDER HYDRODISTENSION
Anesthesia: General | Site: Bladder | Wound class: Clean Contaminated

## 2011-02-24 MED ORDER — FENTANYL CITRATE 0.05 MG/ML IJ SOLN
INTRAMUSCULAR | Status: DC | PRN
  Administered 2011-02-24: 50 ug via INTRAVENOUS

## 2011-02-24 MED ORDER — LACTATED RINGERS IV SOLN
INTRAVENOUS | Status: DC
  Administered 2011-02-24: 12:00:00 via INTRAVENOUS
  Administered 2011-02-24: 50 mL/h via INTRAVENOUS

## 2011-02-24 MED ORDER — PROMETHAZINE HCL 25 MG/ML IJ SOLN: 6.2500 mg | INTRAMUSCULAR | Status: DC | PRN

## 2011-02-24 MED ORDER — HYDROCODONE-ACETAMINOPHEN 5-500 MG PO TABS: 1.0000 | ORAL_TABLET | Freq: Four times a day (QID) | ORAL | Status: AC | PRN

## 2011-02-24 MED ORDER — LABETALOL HCL 5 MG/ML IV SOLN
INTRAVENOUS | Status: DC | PRN
  Administered 2011-02-24: 2.5 mg via INTRAVENOUS

## 2011-02-24 MED ORDER — STERILE WATER FOR IRRIGATION IR SOLN
Status: DC | PRN
  Administered 2011-02-24: 500 mL

## 2011-02-24 MED ORDER — PHENAZOPYRIDINE HCL 200 MG PO TABS
ORAL | Status: DC | PRN
  Administered 2011-02-24: 13:00:00 via INTRAVESICAL

## 2011-02-24 MED ORDER — ONDANSETRON HCL 4 MG/2ML IJ SOLN
INTRAMUSCULAR | Status: DC | PRN
  Administered 2011-02-24: 4 mg via INTRAVENOUS

## 2011-02-24 MED ORDER — STERILE WATER FOR IRRIGATION IR SOLN
Status: DC | PRN
  Administered 2011-02-24: 3000 mL

## 2011-02-24 MED ORDER — MEPERIDINE HCL 25 MG/ML IJ SOLN: 6.2500 mg | INTRAMUSCULAR | Status: DC | PRN

## 2011-02-24 MED ORDER — CIPROFLOXACIN IN D5W 400 MG/200ML IV SOLN
400.0000 mg | INTRAVENOUS | Status: AC
  Administered 2011-02-24: 400 mg via INTRAVENOUS

## 2011-02-24 MED ORDER — LACTATED RINGERS IV SOLN: INTRAVENOUS | Status: DC

## 2011-02-24 MED ORDER — LIDOCAINE HCL (CARDIAC) 20 MG/ML IV SOLN
INTRAVENOUS | Status: DC | PRN
  Administered 2011-02-24: 60 mg via INTRAVENOUS

## 2011-02-24 MED ORDER — CIPROFLOXACIN HCL 250 MG PO TABS: 250.0000 mg | ORAL_TABLET | Freq: Two times a day (BID) | ORAL | Status: AC

## 2011-02-24 MED ORDER — FENTANYL CITRATE 0.05 MG/ML IJ SOLN: 25.0000 ug | INTRAMUSCULAR | Status: DC | PRN

## 2011-02-24 MED ORDER — PROPOFOL 10 MG/ML IV EMUL
INTRAVENOUS | Status: DC | PRN
  Administered 2011-02-24: 150 mg via INTRAVENOUS

## 2011-02-24 SURGICAL SUPPLY — 23 items
BAG DRAIN URO-CYSTO SKYTR STRL (DRAIN) ×2 IMPLANT
CANISTER SUCT LVC 12 LTR MEDI- (MISCELLANEOUS) IMPLANT
CANISTER SUCTION 2500CC (MISCELLANEOUS) ×2 IMPLANT
CATH FOLEY 2WAY SLVR  5CC 18FR (CATHETERS)
CATH FOLEY 2WAY SLVR 5CC 18FR (CATHETERS) IMPLANT
CATH ROBINSON RED A/P 12FR (CATHETERS) IMPLANT
CATH ROBINSON RED A/P 14FR (CATHETERS) ×2 IMPLANT
CLOTH BEACON ORANGE TIMEOUT ST (SAFETY) ×2 IMPLANT
DRAPE CAMERA CLOSED 9X96 (DRAPES) ×2 IMPLANT
ELECT REM PT RETURN 9FT ADLT (ELECTROSURGICAL)
ELECTRODE REM PT RTRN 9FT ADLT (ELECTROSURGICAL) IMPLANT
GLOVE BIO SURGEON STRL SZ 6.5 (GLOVE) ×2 IMPLANT
GLOVE BIO SURGEON STRL SZ7.5 (GLOVE) ×2 IMPLANT
GLOVE ECLIPSE 6.5 STRL STRAW (GLOVE) ×2 IMPLANT
GOWN STRL REIN XL XLG (GOWN DISPOSABLE) ×2 IMPLANT
GOWN SURGICAL LARGE (GOWNS) ×2 IMPLANT
NDL SAFETY ECLIPSE 18X1.5 (NEEDLE) ×1 IMPLANT
NEEDLE HYPO 18GX1.5 SHARP (NEEDLE) ×1
PACK CYSTOSCOPY (CUSTOM PROCEDURE TRAY) ×2 IMPLANT
SUT SILK 0 TIES 10X30 (SUTURE) IMPLANT
SYR 20CC LL (SYRINGE) ×2 IMPLANT
WATER STERILE IRR 3000ML UROMA (IV SOLUTION) ×2 IMPLANT
WATER STERILE IRR 500ML POUR (IV SOLUTION) ×2 IMPLANT

## 2011-02-24 NOTE — H&P (Signed)
History of Present Illness   Christine [name] has had some infrequent urinary tract infections. Other times of course she has flare-ups of her interstitial cystitis that respond favorably to rescue treatments.  Today I repeated a urinalysis and she had a few bacteria and her urine was sent for culture.   Review of systems: No change in bowel or neurologic status.   Prolapse surgery went very well for her cystocele.   Christine [name] had an acute onset of suprapubic pain. She has some frequency. There is no other modifying factors or associated signs or symptoms. There is no other aggravating or relieving factors. She sometimes takes Art gallery manager. Rescue treatments usually help a lot. She thinks this is a flare-up and not an infection.    Past Medical History Problems  1. History of  Arthritis V13.4 2. History of  Bladder Irrigation 3. History of  Bladder Irrigation 4. History of  Esophageal Reflux 530.81 5. History of  Heartburn 787.1 6. History of  Hypercholesterolemia 272.0 7. History of  Hypertension 401.9 8. History of  Ischemic Stroke 434.90 9. History of  Nephrolithiasis V13.01  Surgical History Problems  1. History of  Anterior Colporrhaphy, Repair Of Cystocele 2. History of  Cystoscopy With Dilation Of Bladder 3. History of  Cystoscopy With Dilation Of Bladder 4. History of  Hysterectomy 5. History of  Sacrospinous Ligament Fixation For Posthysterectomy Prolapse 6. History of  Vaginal Surg Insertion Of Mesh For Pelvic Floor Repair  Current Meds 1. Aspirin 325 MG Oral Tablet; Therapy: (Recorded:22Oct2009) to 2. Avapro 150 MG Oral Tablet; Therapy: (Recorded:08Apr2008) to 3. Citalopram Hydrobromide 20 MG Oral Tablet; Therapy: 13Feb2012 to 4. Estrace 0.1 MG/GM Vaginal Cream; 1 gm 3x/week x 4 weeks, then 1x per week; Therapy:  16Sep2010 to (Last Rx:16Sep2010) 5. Gabapentin 300 MG Oral Capsule; Therapy: 01May2012 to 6. LORazepam TABS; Therapy: (Recorded:22Oct2009) to 7.  Losartan Potassium 50 MG Oral Tablet; Therapy: 22May2012 to 8. Omeprazole 20 MG Oral Capsule Delayed Release; Therapy: 01Aug2012 to 9. Phenazopyridine HCl 200 MG Oral Tablet; TAKE 1 TABLET BY MOUTH   EVERY 6 HOURS AS  NEEDED  FOR BLADDER PAIN; Therapy: 04May2010 to (Evaluate:14Dec2011); Last  Rx:17Jun2011 10. Pravastatin Sodium 40 MG Oral Tablet; Therapy: 14Dec2012 to 11. Premarin 1.25 MG Oral Tablet; Therapy: (Recorded:08Apr2008) to 12. Protonix PACK; Therapy: (Recorded:19Nov2009) to 13. Utira-C 81.6 MG Oral Tablet; TAKE 1 TABLET Every 8 hours prn urinary burning; Therapy:   10Sep2010 to (Last Rx:10Sep2010) 14. Vitamin D CAPS; Therapy: (Recorded:22Oct2009) to 15. Zolpidem Tartrate 10 MG Oral Tablet; Therapy: 20Sep2012 to  Allergies Medication  1. Gabapentin TABS 2. Gelnique GEL  Family History Problems  1. Family history of  Death In The Family Father 29 2. Family history of  Death In The Family Mother 45 3. Family history of  Family Health Status Number Of Children 1 son 1 daughter 4. Family history of  Urologic Disorder V18.7 Kidney stones  Social History Problems  1. Caffeine Use 1 per day 2. Exercise Habits Patient uses her Ab Lounger every other day and she walks to and from her mailbox daily. Note on a regular exercise. 3. Marital History - Currently Married 4. Never A Smoker 5. Occupation: Retired 6. Self-reliant In Usual Daily Activities Denied  7. Alcohol Use 8. Tobacco Use  Vitals Vital Signs [Data Includes: Last 1 Day]  21Dec2012 11:12AM  BMI Calculated: 23.32 BSA Calculated: 1.7 Height: 5 ft 5 in Weight: 140 lb  Blood Pressure: 136 / 78 Temperature: 97.9 F Heart  Rate: 64  Results/Data  Urine [Data Includes: Last 1 Day]   21Dec2012  COLOR ORANGE   APPEARANCE CLEAR   SPECIFIC GRAVITY 1.025   pH 6.0   GLUCOSE 100 mg/dL  BILIRUBIN NEG   KETONE TRACE mg/dL  BLOOD NEG   PROTEIN 30 mg/dL  UROBILINOGEN 4 mg/dL  NITRITE POS   LEUKOCYTE ESTERASE  NEG   SQUAMOUS EPITHELIAL/HPF FEW   WBC 0-3 WBC/hpf  RBC 0-3 RBC/hpf  BACTERIA FEW   CRYSTALS NONE SEEN   CASTS NONE SEEN   Other MICRO UNSPUN:QNS    Assessment Assessed  1. Abdominal Pain 789.00 2. Chronic Interstitial Cystitis 595.1  Plan   Discussion/Summary   Christine [name] lost her husband I believe in the last year. We talked about that briefly and hope Christmas goes well for her. She will have a treatment today and we will schedule one for Monday. She will call and cancel if she is feeling acutely better. We have been giving her Marcaine monotherapy.  After a thorough review of the management options for the patient's condition the patient  elected to proceed with surgical therapy as noted above. We have discussed the potential benefits and risks of the procedure, side effects of the proposed treatment, the likelihood of the patient achieving the goals of the procedure, and any potential problems that might occur during the procedure or recuperation. Informed consent has been obtained.

## 2011-02-24 NOTE — Anesthesia Preprocedure Evaluation (Addendum)
Anesthesia Evaluation  Patient identified by MRN, date of birth, ID band Patient awake    Reviewed: Allergy & Precautions, H&P , NPO status , Patient's Chart, lab work & pertinent test results  Airway Mallampati: II TM Distance: >3 FB Neck ROM: Full    Dental No notable dental hx.    Pulmonary neg pulmonary ROS,  clear to auscultation  Pulmonary exam normal       Cardiovascular hypertension, Pt. on medications neg cardio ROS Regular Normal    Neuro/Psych Negative Neurological ROS  Negative Psych ROS   GI/Hepatic negative GI ROS, Neg liver ROS, GERD-  Medicated,  Endo/Other  Negative Endocrine ROS  Renal/GU negative Renal ROS  Genitourinary negative   Musculoskeletal negative musculoskeletal ROS (+)   Abdominal   Peds negative pediatric ROS (+)  Hematology negative hematology ROS (+)   Anesthesia Other Findings Upper front cap   Reproductive/Obstetrics negative OB ROS                          Anesthesia Physical Anesthesia Plan  ASA: II  Anesthesia Plan: General   Post-op Pain Management:    Induction: Intravenous  Airway Management Planned:   Additional Equipment:   Intra-op Plan:   Post-operative Plan: Extubation in OR  Informed Consent: I have reviewed the patients History and Physical, chart, labs and discussed the procedure including the risks, benefits and alternatives for the proposed anesthesia with the patient or authorized representative who has indicated his/her understanding and acceptance.   Dental advisory given  Plan Discussed with: CRNA  Anesthesia Plan Comments:         Anesthesia Quick Evaluation
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
## 2011-02-24 NOTE — Transfer of Care (Signed)
Immediate Anesthesia Transfer of Care Note  Patient: Christine [name]  Procedure(s) Performed: Procedure(s) (LRB): CYSTOSCOPY/HYDRODISTENSION (N/A)  Patient Location: Patient transported to PACU with oxygen via face mask at 4 Liters / Min  Anesthesia Type: General  Level of Consciousness: awake and alert   Airway & Oxygen Therapy: Patient Spontanous Breathing and Patient connected to face mask oxygen  Post-op Assessment: Report given to PACU RN and Post -op Vital signs reviewed and stable  Post vital signs: Reviewed and stable  Dentition: Teeth and oropharynx remain in pre-op condition  Complications: No apparent anesthesia complications

## 2011-02-24 NOTE — Progress Notes (Signed)
Dr Sherron Monday notified of pt ready to be D/C. No new orders noted. Pt informed Dr MacDiarmid's nurse will call to check on pt w f/u appoint & to notify his office if complications. Pt's family voices understanding

## 2011-02-24 NOTE — Discharge Instructions (Signed)
I have reviewed discharge instructions in detail with the patient. They will follow-up with me or their physician as scheduled. My nurse will also be calling the patients as per protocol.   

## 2011-02-24 NOTE — Anesthesia Postprocedure Evaluation (Signed)
  Anesthesia Post-op Note  Patient: Christine [name]  Procedure(s) Performed: Procedure(s) (LRB): CYSTOSCOPY/HYDRODISTENSION (N/A)  Patient Location: PACU  Anesthesia Type: General  Level of Consciousness: awake and alert   Airway and Oxygen Therapy: Patient Spontanous Breathing  Post-op Pain: mild  Post-op Assessment: Post-op Vital signs reviewed, Patient's Cardiovascular Status Stable, Respiratory Function Stable, Patent Airway and No signs of Nausea or vomiting  Post-op Vital Signs: stable  Complications: No apparent anesthesia complications

## 2011-02-24 NOTE — Interval H&P Note (Signed)
History and Physical Interval Note:  02/24/2011 7:00 AM  Christine [name]  has presented today for surgery, with the diagnosis of PELVIC PAIN  The various methods of treatment have been discussed with the patient and family. After consideration of risks, benefits and other options for treatment, the patient has consented to  Procedure(s) (LRB): CYSTOSCOPY/HYDRODISTENSION (N/A) as a surgical intervention .  The patients' history has been reviewed, patient examined, no change in status, stable for surgery.  I have reviewed the patients' chart and labs.  Questions were answered to the patient's satisfaction.     Lamorris Knoblock A

## 2011-02-25 ENCOUNTER — Encounter (HOSPITAL_BASED_OUTPATIENT_CLINIC_OR_DEPARTMENT_OTHER): Payer: Self-pay | Admitting: Urology

## 2011-03-10 DIAGNOSIS — N8110 Cystocele, unspecified: Secondary | ICD-10-CM | POA: Diagnosis not present

## 2011-03-10 DIAGNOSIS — R109 Unspecified abdominal pain: Secondary | ICD-10-CM | POA: Diagnosis not present

## 2011-03-10 DIAGNOSIS — N3010 Interstitial cystitis (chronic) without hematuria: Secondary | ICD-10-CM | POA: Diagnosis not present

## 2011-03-19 DIAGNOSIS — N390 Urinary tract infection, site not specified: Secondary | ICD-10-CM | POA: Diagnosis not present

## 2011-04-20 DIAGNOSIS — E559 Vitamin D deficiency, unspecified: Secondary | ICD-10-CM | POA: Diagnosis not present

## 2011-04-20 DIAGNOSIS — M25569 Pain in unspecified knee: Secondary | ICD-10-CM | POA: Diagnosis not present

## 2011-04-20 DIAGNOSIS — I10 Essential (primary) hypertension: Secondary | ICD-10-CM | POA: Diagnosis not present

## 2011-04-20 DIAGNOSIS — M1710 Unilateral primary osteoarthritis, unspecified knee: Secondary | ICD-10-CM | POA: Diagnosis not present

## 2011-05-14 DIAGNOSIS — H5210 Myopia, unspecified eye: Secondary | ICD-10-CM | POA: Diagnosis not present

## 2011-05-14 DIAGNOSIS — H524 Presbyopia: Secondary | ICD-10-CM | POA: Diagnosis not present

## 2011-05-14 DIAGNOSIS — H52229 Regular astigmatism, unspecified eye: Secondary | ICD-10-CM | POA: Diagnosis not present

## 2011-05-14 DIAGNOSIS — H359 Unspecified retinal disorder: Secondary | ICD-10-CM | POA: Diagnosis not present

## 2011-05-26 DIAGNOSIS — Q828 Other specified congenital malformations of skin: Secondary | ICD-10-CM | POA: Diagnosis not present

## 2011-05-26 DIAGNOSIS — L851 Acquired keratosis [keratoderma] palmaris et plantaris: Secondary | ICD-10-CM | POA: Diagnosis not present

## 2011-06-02 ENCOUNTER — Other Ambulatory Visit: Payer: Self-pay | Admitting: *Deleted

## 2011-06-02 NOTE — Telephone Encounter (Signed)
Opened in Error.

## 2011-07-07 ENCOUNTER — Emergency Department (HOSPITAL_COMMUNITY): Payer: Medicare Other

## 2011-07-07 ENCOUNTER — Encounter (HOSPITAL_COMMUNITY): Payer: Self-pay | Admitting: *Deleted

## 2011-07-07 ENCOUNTER — Emergency Department (HOSPITAL_COMMUNITY)
Admission: EM | Admit: 2011-07-07 | Discharge: 2011-07-07 | Disposition: A | Discharge status: Home / Self Care | Payer: Medicare Other | Attending: Emergency Medicine | Admitting: Emergency Medicine

## 2011-07-07 DIAGNOSIS — M129 Arthropathy, unspecified: Secondary | ICD-10-CM | POA: Diagnosis not present

## 2011-07-07 DIAGNOSIS — I10 Essential (primary) hypertension: Secondary | ICD-10-CM | POA: Diagnosis not present

## 2011-07-07 DIAGNOSIS — K219 Gastro-esophageal reflux disease without esophagitis: Secondary | ICD-10-CM | POA: Insufficient documentation

## 2011-07-07 DIAGNOSIS — E785 Hyperlipidemia, unspecified: Secondary | ICD-10-CM | POA: Diagnosis not present

## 2011-07-07 DIAGNOSIS — R4182 Altered mental status, unspecified: Secondary | ICD-10-CM | POA: Diagnosis not present

## 2011-07-07 DIAGNOSIS — F29 Unspecified psychosis not due to a substance or known physiological condition: Secondary | ICD-10-CM | POA: Insufficient documentation

## 2011-07-07 DIAGNOSIS — N390 Urinary tract infection, site not specified: Secondary | ICD-10-CM | POA: Insufficient documentation

## 2011-07-07 DIAGNOSIS — Z043 Encounter for examination and observation following other accident: Secondary | ICD-10-CM | POA: Diagnosis not present

## 2011-07-07 DIAGNOSIS — R5381 Other malaise: Secondary | ICD-10-CM | POA: Insufficient documentation

## 2011-07-07 DIAGNOSIS — R5383 Other fatigue: Secondary | ICD-10-CM | POA: Diagnosis not present

## 2011-07-07 DIAGNOSIS — Z79899 Other long term (current) drug therapy: Secondary | ICD-10-CM | POA: Insufficient documentation

## 2011-07-07 DIAGNOSIS — R42 Dizziness and giddiness: Secondary | ICD-10-CM | POA: Diagnosis not present

## 2011-07-07 LAB — CBC WITH DIFFERENTIAL/PLATELET
Lymphocytes Relative: 27 % (ref 12–46)
Lymphs Abs: 1.7 10*3/uL (ref 0.7–4.0)
MCV: 93.3 fL (ref 78.0–100.0)
Neutro Abs: 3.9 10*3/uL (ref 1.7–7.7)
Neutrophils Relative %: 62 % (ref 43–77)
Platelets: 190 10*3/uL (ref 150–400)
RBC: 4.03 MIL/uL (ref 3.87–5.11)
WBC: 6.3 10*3/uL (ref 4.0–10.5)

## 2011-07-07 LAB — COMPREHENSIVE METABOLIC PANEL
ALT: 12 U/L (ref 0–35)
AST: 20 U/L (ref 0–37)
Alkaline Phosphatase: 53 U/L (ref 39–117)
CO2: 26 mEq/L (ref 19–32)
Chloride: 101 mEq/L (ref 96–112)
GFR calc Af Amer: 47 mL/min — ABNORMAL LOW (ref >90)
GFR calc non Af Amer: 40 mL/min — ABNORMAL LOW (ref >90)
Glucose, Bld: 80 mg/dL (ref 70–99)
Potassium: 3.5 mEq/L (ref 3.5–5.1)
Sodium: 136 mEq/L (ref 135–145)

## 2011-07-07 LAB — URINE MICROSCOPIC-ADD ON

## 2011-07-07 LAB — URINALYSIS, ROUTINE W REFLEX MICROSCOPIC
Bilirubin Urine: NEGATIVE
Hgb urine dipstick: NEGATIVE
Ketones, ur: NEGATIVE mg/dL
Nitrite: POSITIVE — AB
Urobilinogen, UA: 1 mg/dL (ref 0.0–1.0)

## 2011-07-07 MED ORDER — CIPROFLOXACIN HCL 500 MG PO TABS: 500.0000 mg | ORAL_TABLET | Freq: Two times a day (BID) | ORAL | Status: AC

## 2011-07-07 MED ORDER — DEXTROSE 5 % IV SOLN
1.0000 g | Freq: Once | INTRAVENOUS | Status: AC
  Administered 2011-07-07: 1 g via INTRAVENOUS
  Filled 2011-07-07: qty 10

## 2011-07-07 MED ORDER — SODIUM CHLORIDE 0.9 % IV BOLUS (SEPSIS): 1000.0000 mL | Freq: Once | INTRAVENOUS | Status: DC
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
## 2011-07-09 LAB — URINE CULTURE: Colony Count: NO GROWTH

## 2011-07-10 DIAGNOSIS — M5126 Other intervertebral disc displacement, lumbar region: Secondary | ICD-10-CM | POA: Diagnosis not present

## 2011-07-10 DIAGNOSIS — M47814 Spondylosis without myelopathy or radiculopathy, thoracic region: Secondary | ICD-10-CM | POA: Diagnosis not present

## 2011-07-10 DIAGNOSIS — IMO0002 Reserved for concepts with insufficient information to code with codable children: Secondary | ICD-10-CM | POA: Diagnosis not present

## 2011-07-13 DIAGNOSIS — R300 Dysuria: Secondary | ICD-10-CM | POA: Diagnosis not present

## 2011-07-13 DIAGNOSIS — R82998 Other abnormal findings in urine: Secondary | ICD-10-CM | POA: Diagnosis not present

## 2011-07-14 ENCOUNTER — Emergency Department (HOSPITAL_COMMUNITY)
Admission: EM | Admit: 2011-07-14 | Discharge: 2011-07-14 | Disposition: A | Discharge status: Home / Self Care | Payer: Medicare Other | Attending: Emergency Medicine | Admitting: Emergency Medicine

## 2011-07-14 ENCOUNTER — Emergency Department (HOSPITAL_COMMUNITY): Payer: Medicare Other

## 2011-07-14 ENCOUNTER — Encounter (HOSPITAL_COMMUNITY): Payer: Self-pay | Admitting: Emergency Medicine

## 2011-07-14 DIAGNOSIS — E785 Hyperlipidemia, unspecified: Secondary | ICD-10-CM | POA: Diagnosis not present

## 2011-07-14 DIAGNOSIS — Z79899 Other long term (current) drug therapy: Secondary | ICD-10-CM | POA: Insufficient documentation

## 2011-07-14 DIAGNOSIS — W19XXXA Unspecified fall, initial encounter: Secondary | ICD-10-CM

## 2011-07-14 DIAGNOSIS — R51 Headache: Secondary | ICD-10-CM | POA: Diagnosis not present

## 2011-07-14 DIAGNOSIS — I10 Essential (primary) hypertension: Secondary | ICD-10-CM | POA: Insufficient documentation

## 2011-07-14 DIAGNOSIS — S0180XA Unspecified open wound of other part of head, initial encounter: Secondary | ICD-10-CM | POA: Diagnosis not present

## 2011-07-14 DIAGNOSIS — M542 Cervicalgia: Secondary | ICD-10-CM | POA: Diagnosis not present

## 2011-07-14 DIAGNOSIS — N390 Urinary tract infection, site not specified: Secondary | ICD-10-CM | POA: Insufficient documentation

## 2011-07-14 DIAGNOSIS — W010XXA Fall on same level from slipping, tripping and stumbling without subsequent striking against object, initial encounter: Secondary | ICD-10-CM | POA: Insufficient documentation

## 2011-07-14 DIAGNOSIS — S0191XA Laceration without foreign body of unspecified part of head, initial encounter: Secondary | ICD-10-CM

## 2011-07-14 MED ORDER — TETANUS-DIPHTH-ACELL PERTUSSIS 5-2.5-18.5 LF-MCG/0.5 IM SUSP
0.5000 mL | Freq: Once | INTRAMUSCULAR | Status: AC
  Administered 2011-07-14: 0.5 mL via INTRAMUSCULAR

## 2011-07-14 MED ORDER — LIDOCAINE-EPINEPHRINE 2 %-1:100000 IJ SOLN
20.0000 mL | Freq: Once | INTRAMUSCULAR | Status: DC
  Filled 2011-07-14: qty 20

## 2011-07-14 MED ORDER — TETANUS-DIPHTH-ACELL PERTUSSIS 5-2.5-18.5 LF-MCG/0.5 IM SUSP
INTRAMUSCULAR | Status: AC
  Filled 2011-07-14: qty 0.5

## 2011-07-14 NOTE — ED Notes (Signed)
First contact with pt . Pt alert x4 respirations easy non labored. Skin w/d

## 2011-07-14 NOTE — ED Notes (Signed)
MD states that pt can have C-Collar removed due to negative c-spine CT.

## 2011-07-14 NOTE — ED Notes (Addendum)
Pt c/o fall x 2 today while walking outside; pt sts lost balance due to leaning to right which pt sts she has done for several years; pt denies any extremity weakness or neuro deficit; pt denies LOC; pt with abrasion to forehead and abrasion to hands; pt sts some neck pain; pt currently being treated for UTI
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
## 2011-07-14 NOTE — ED Notes (Signed)
Pt ambulated with resident.

## 2011-07-15 NOTE — ED Provider Notes (Signed)
I saw and evaluated the patient, reviewed the resident's note and I agree with the findings and plan.   Loren Racer, MD 07/15/11 [phone]

## 2011-07-28 DIAGNOSIS — R82998 Other abnormal findings in urine: Secondary | ICD-10-CM | POA: Diagnosis not present

## 2011-07-28 DIAGNOSIS — I10 Essential (primary) hypertension: Secondary | ICD-10-CM | POA: Diagnosis not present

## 2011-07-28 DIAGNOSIS — E559 Vitamin D deficiency, unspecified: Secondary | ICD-10-CM | POA: Diagnosis not present

## 2011-07-28 DIAGNOSIS — E785 Hyperlipidemia, unspecified: Secondary | ICD-10-CM | POA: Diagnosis not present

## 2011-07-29 DIAGNOSIS — D649 Anemia, unspecified: Secondary | ICD-10-CM | POA: Diagnosis not present

## 2011-08-04 DIAGNOSIS — I10 Essential (primary) hypertension: Secondary | ICD-10-CM | POA: Diagnosis not present

## 2011-08-04 DIAGNOSIS — Z124 Encounter for screening for malignant neoplasm of cervix: Secondary | ICD-10-CM | POA: Diagnosis not present

## 2011-08-04 DIAGNOSIS — E785 Hyperlipidemia, unspecified: Secondary | ICD-10-CM | POA: Diagnosis not present

## 2011-08-04 DIAGNOSIS — F411 Generalized anxiety disorder: Secondary | ICD-10-CM | POA: Diagnosis not present

## 2011-08-04 DIAGNOSIS — Z0000 Encounter for general adult medical examination without abnormal findings: Secondary | ICD-10-CM | POA: Diagnosis not present

## 2011-08-11 DIAGNOSIS — M899 Disorder of bone, unspecified: Secondary | ICD-10-CM | POA: Diagnosis not present

## 2011-08-11 DIAGNOSIS — M949 Disorder of cartilage, unspecified: Secondary | ICD-10-CM | POA: Diagnosis not present

## 2011-09-23 DIAGNOSIS — Z23 Encounter for immunization: Secondary | ICD-10-CM | POA: Diagnosis not present

## 2011-10-06 DIAGNOSIS — H43829 Vitreomacular adhesion, unspecified eye: Secondary | ICD-10-CM | POA: Diagnosis not present

## 2011-10-12 DIAGNOSIS — L578 Other skin changes due to chronic exposure to nonionizing radiation: Secondary | ICD-10-CM | POA: Diagnosis not present

## 2011-10-12 DIAGNOSIS — L570 Actinic keratosis: Secondary | ICD-10-CM | POA: Diagnosis not present

## 2011-10-30 DIAGNOSIS — R82998 Other abnormal findings in urine: Secondary | ICD-10-CM | POA: Diagnosis not present

## 2011-10-30 DIAGNOSIS — R300 Dysuria: Secondary | ICD-10-CM | POA: Diagnosis not present

## 2011-11-10 DIAGNOSIS — E785 Hyperlipidemia, unspecified: Secondary | ICD-10-CM | POA: Diagnosis not present

## 2011-11-10 DIAGNOSIS — D649 Anemia, unspecified: Secondary | ICD-10-CM | POA: Diagnosis not present

## 2011-11-18 DIAGNOSIS — R300 Dysuria: Secondary | ICD-10-CM | POA: Diagnosis not present

## 2011-11-18 DIAGNOSIS — R82998 Other abnormal findings in urine: Secondary | ICD-10-CM | POA: Diagnosis not present

## 2011-11-26 DIAGNOSIS — M722 Plantar fascial fibromatosis: Secondary | ICD-10-CM | POA: Diagnosis not present

## 2011-12-07 DIAGNOSIS — R209 Unspecified disturbances of skin sensation: Secondary | ICD-10-CM | POA: Diagnosis not present

## 2011-12-07 DIAGNOSIS — IMO0002 Reserved for concepts with insufficient information to code with codable children: Secondary | ICD-10-CM | POA: Diagnosis not present

## 2011-12-10 DIAGNOSIS — M4804 Spinal stenosis, thoracic region: Secondary | ICD-10-CM | POA: Diagnosis not present

## 2011-12-24 DIAGNOSIS — M722 Plantar fascial fibromatosis: Secondary | ICD-10-CM | POA: Diagnosis not present

## 2012-01-09 DIAGNOSIS — R110 Nausea: Secondary | ICD-10-CM | POA: Diagnosis not present

## 2012-01-09 DIAGNOSIS — R5381 Other malaise: Secondary | ICD-10-CM | POA: Diagnosis not present

## 2012-01-09 DIAGNOSIS — R300 Dysuria: Secondary | ICD-10-CM | POA: Diagnosis not present

## 2012-01-18 DIAGNOSIS — R197 Diarrhea, unspecified: Secondary | ICD-10-CM | POA: Diagnosis not present

## 2012-01-18 DIAGNOSIS — R110 Nausea: Secondary | ICD-10-CM | POA: Diagnosis not present

## 2012-01-18 DIAGNOSIS — I10 Essential (primary) hypertension: Secondary | ICD-10-CM | POA: Diagnosis not present

## 2012-01-18 DIAGNOSIS — R634 Abnormal weight loss: Secondary | ICD-10-CM | POA: Diagnosis not present

## 2012-01-18 DIAGNOSIS — R300 Dysuria: Secondary | ICD-10-CM | POA: Diagnosis not present

## 2012-01-28 DIAGNOSIS — N3010 Interstitial cystitis (chronic) without hematuria: Secondary | ICD-10-CM | POA: Diagnosis not present

## 2012-01-28 DIAGNOSIS — F411 Generalized anxiety disorder: Secondary | ICD-10-CM | POA: Diagnosis not present

## 2012-01-28 DIAGNOSIS — I10 Essential (primary) hypertension: Secondary | ICD-10-CM | POA: Diagnosis not present

## 2012-01-28 DIAGNOSIS — K644 Residual hemorrhoidal skin tags: Secondary | ICD-10-CM | POA: Diagnosis not present

## 2012-02-11 DIAGNOSIS — M5137 Other intervertebral disc degeneration, lumbosacral region: Secondary | ICD-10-CM | POA: Diagnosis not present

## 2012-02-11 DIAGNOSIS — M999 Biomechanical lesion, unspecified: Secondary | ICD-10-CM | POA: Diagnosis not present

## 2012-02-11 DIAGNOSIS — M25559 Pain in unspecified hip: Secondary | ICD-10-CM | POA: Diagnosis not present

## 2012-02-23 DIAGNOSIS — M25559 Pain in unspecified hip: Secondary | ICD-10-CM | POA: Diagnosis not present

## 2012-02-23 DIAGNOSIS — M999 Biomechanical lesion, unspecified: Secondary | ICD-10-CM | POA: Diagnosis not present

## 2012-02-23 DIAGNOSIS — M5137 Other intervertebral disc degeneration, lumbosacral region: Secondary | ICD-10-CM | POA: Diagnosis not present

## 2012-02-29 DIAGNOSIS — M5137 Other intervertebral disc degeneration, lumbosacral region: Secondary | ICD-10-CM | POA: Diagnosis not present

## 2012-02-29 DIAGNOSIS — M999 Biomechanical lesion, unspecified: Secondary | ICD-10-CM | POA: Diagnosis not present

## 2012-02-29 DIAGNOSIS — M25559 Pain in unspecified hip: Secondary | ICD-10-CM | POA: Diagnosis not present

## 2012-03-01 DIAGNOSIS — M5137 Other intervertebral disc degeneration, lumbosacral region: Secondary | ICD-10-CM | POA: Diagnosis not present

## 2012-03-01 DIAGNOSIS — M999 Biomechanical lesion, unspecified: Secondary | ICD-10-CM | POA: Diagnosis not present

## 2012-03-01 DIAGNOSIS — M25559 Pain in unspecified hip: Secondary | ICD-10-CM | POA: Diagnosis not present

## 2012-03-03 DIAGNOSIS — M999 Biomechanical lesion, unspecified: Secondary | ICD-10-CM | POA: Diagnosis not present

## 2012-03-03 DIAGNOSIS — M5137 Other intervertebral disc degeneration, lumbosacral region: Secondary | ICD-10-CM | POA: Diagnosis not present

## 2012-03-03 DIAGNOSIS — M25559 Pain in unspecified hip: Secondary | ICD-10-CM | POA: Diagnosis not present

## 2012-03-09 DIAGNOSIS — M999 Biomechanical lesion, unspecified: Secondary | ICD-10-CM | POA: Diagnosis not present

## 2012-03-09 DIAGNOSIS — M25559 Pain in unspecified hip: Secondary | ICD-10-CM | POA: Diagnosis not present

## 2012-03-09 DIAGNOSIS — M5137 Other intervertebral disc degeneration, lumbosacral region: Secondary | ICD-10-CM | POA: Diagnosis not present

## 2012-03-28 DIAGNOSIS — R634 Abnormal weight loss: Secondary | ICD-10-CM | POA: Diagnosis not present

## 2012-03-28 DIAGNOSIS — F411 Generalized anxiety disorder: Secondary | ICD-10-CM | POA: Diagnosis not present

## 2012-03-28 DIAGNOSIS — R82998 Other abnormal findings in urine: Secondary | ICD-10-CM | POA: Diagnosis not present

## 2012-03-28 DIAGNOSIS — I10 Essential (primary) hypertension: Secondary | ICD-10-CM | POA: Diagnosis not present

## 2012-03-28 DIAGNOSIS — R300 Dysuria: Secondary | ICD-10-CM | POA: Diagnosis not present

## 2012-03-28 DIAGNOSIS — G894 Chronic pain syndrome: Secondary | ICD-10-CM | POA: Diagnosis not present

## 2012-05-04 ENCOUNTER — Telehealth: Payer: Self-pay | Admitting: Internal Medicine

## 2012-05-04 NOTE — Telephone Encounter (Signed)
Pt scheduled to see Dr. Marina Goodell 05/18/12@8 :30am. Left message for Malachi Bonds to call back regarding appt.

## 2012-05-04 NOTE — Telephone Encounter (Signed)
Spoke with pt and she is aware. Malachi Bonds to fax notes and notify the pts daughter regarding the appt.

## 2012-05-17 DIAGNOSIS — IMO0002 Reserved for concepts with insufficient information to code with codable children: Secondary | ICD-10-CM | POA: Diagnosis not present

## 2012-05-18 ENCOUNTER — Encounter: Payer: Self-pay | Admitting: Internal Medicine

## 2012-05-18 ENCOUNTER — Ambulatory Visit (INDEPENDENT_AMBULATORY_CARE_PROVIDER_SITE_OTHER): Payer: Medicare Other | Admitting: Internal Medicine

## 2012-05-18 VITALS — BP 128/62 | HR 71 | Ht 64.0 in | Wt 130.0 lb

## 2012-05-18 DIAGNOSIS — Z86010 Personal history of colonic polyps: Secondary | ICD-10-CM

## 2012-05-18 DIAGNOSIS — K219 Gastro-esophageal reflux disease without esophagitis: Secondary | ICD-10-CM

## 2012-05-18 DIAGNOSIS — R1310 Dysphagia, unspecified: Secondary | ICD-10-CM | POA: Diagnosis not present

## 2012-05-18 DIAGNOSIS — R110 Nausea: Secondary | ICD-10-CM | POA: Diagnosis not present

## 2012-05-18 NOTE — Patient Instructions (Addendum)
You have been scheduled for an abdominal ultrasound at [facility] (1st floor of hospital) on 05-24-12 at 9:30am. Please arrive 15 minutes prior to your appointment for registration. Make certain not to have anything to eat or drink 6 hours prior to your appointment. Should you need to reschedule your appointment, please contact radiology at [phone]. This test typically takes about 30 minutes to perform.  You have been scheduled for an endoscopy with propofol. Please follow written instructions given to you at your visit today. If you use inhalers (even only as needed), please bring them with you on the day of your procedure.  Your physician has requested that you go to www.startemmi.com and enter the access code given to you at your visit today. This web site gives a general overview about your procedure. However, you should still follow specific instructions given to you by our office regarding your preparation for the procedure.  We have given you some samples of Dexilant to try, take one a day

## 2012-05-18 NOTE — Progress Notes (Signed)
HISTORY OF PRESENT ILLNESS:  Christine [name] is a 79 y.o. female with multiple medical problems as outlined below. She has been seen here previously for dyspeptic complaints and colon cancer screening. She does have a history of adenomatous colon polyps (index exam 2005). Patient was last seen in this office January 2012 regarding "burning". She apparently has burning sensation different parts of her body, without cause found. In any event, chief complaint today is constant nausea 2 months duration. The symptoms worse after meals. No vomiting. No weight loss. She was started on Lexapro January. Apparently stopped yesterday. She has been on daily Protonix until yesterday. She does describe burning sensation in the epigastric and substernal region. No regurgitation. She has been given Phenergan for nausea. Not sure she's tried it yet. She is accompanied by her daughter. They are pleasant, but not particularly good with details. Some vague dysphagia. Belching. Decreased appetite. GI review of systems is otherwise negative. Abdominal ultrasound January 2012 revealed 4 mm gallbladder polyp versus non-shadowing adherent stone. Otherwise negative. Contrast CT scan of the abdomen and pelvis February 2012 was unremarkable. Upper endoscopy February 2012 was normal. Her last colonoscopy in December 2009 revealed diverticulosis only. No polyps. She takes tramadol for pain, but states she's been on this for several years. Review of laboratories from March 2014 she is unremarkable CBC and comprehensive metabolic panel.  REVIEW OF SYSTEMS:  All non-GI ROS negative except for anxiety, back pain, fatigue, insomnia, excessive urination, urinary frequency  Past Medical History  Diagnosis Date  . Pelvic pain in female   . Hypertension   . Hyperlipidemia   . Arthritis   . GERD (gastroesophageal reflux disease)   . Interstitial cystitis   . Urgency of urination   . Frequency of urination   . Nocturia   . Colon polyps      adenomatous  . Diverticulosis   . H. pylori infection   . TIA (transient ischemic attack)   . Osteopenia   . Vitamin D deficiency   . Shingles   . Iron deficiency   . AR (allergic rhinitis)     Past Surgical History  Procedure Laterality Date  . Vault suspension with graft/ cystocele repair  09-17-2009  . Ureterolithotomy  2006  &  2008  . Abdominal hysterectomy    . Cysto/ hod/ instillation therapy  2010  &  2011  . Cataract extraction w/ intraocular lens  implant, bilateral  2009  . Cysto with hydrodistension  02/24/2011    Procedure: CYSTOSCOPY/HYDRODISTENSION;  Surgeon: Martina Sinner, MD;  Location: [facility];  Service: Urology;  Laterality: N/A;  INSTILLATION OF marcaine and  pyridium    Social History Christine [name]  reports that she has never smoked. She has never used smokeless tobacco. She reports that she does not drink alcohol or use illicit drugs.  family history is not on file.  Allergies  Allergen Reactions  . Gelnique (Oxybutynin) Other (See Comments)    UNKNOWN  . Pentosan Polysulfate Sodium Other (See Comments)    BLURRED VISION/ HEADACHE       PHYSICAL EXAMINATION: Vital signs: BP 128/62  Pulse 71  Ht 5\' 4"  (1.626 m)  Wt 130 lb (58.968 kg)  BMI 22.3 kg/m2 General: Well-developed, well-nourished, no acute distress HEENT: Sclerae are anicteric, conjunctiva pink. Oral mucosa intact Lungs: Clear Heart: Regular Abdomen: soft, nontender, nondistended, no obvious ascites, no peritoneal signs, normal bowel sounds. No organomegaly. No succussion splash Extremities: No edema Psychiatric: Flat affect,  alert and oriented x3. Cooperative   ASSESSMENT:  #1. Chronic nausea. Possible etiologies include medication, functional, primary GI disorder. #2. Chronic burning sensation. Etiology unclear. Not sure of substernal burning complaints are related to reflux refractory to Protonix. #3. Question gallbladder polyp versus stone 2012.  Reevaluate #4. History of adenomatous colon polyp 2005. Negative examination 2009. No lower GI complaints  PLAN:  #1. Provided samples of Dexilant 60 mg daily. #2. Abdominal ultrasound. Reassess gallbladder #3. Diagnostic upper endoscopy to evaluate chronic nausea, burning refractory to PPI, and vague dysphagia.The nature of the procedure, as well as the risks, benefits, and alternatives were carefully and thoroughly reviewed with the patient. Ample time for discussion and questions allowed. The patient understood, was satisfied, and agreed to proceed. #4. Okay to use antiemetics as needed. If Phenergan to sedating, consider Zofran. #5. If GI workup negative and no response to PPI then likely medication or functional cause for symptoms. If that is the case, she will return to Dr. Waynard Edwards for further management

## 2012-05-19 ENCOUNTER — Ambulatory Visit (AMBULATORY_SURGERY_CENTER): Payer: Medicare Other | Admitting: Internal Medicine

## 2012-05-19 ENCOUNTER — Encounter: Payer: Self-pay | Admitting: Internal Medicine

## 2012-05-19 VITALS — BP 135/71 | HR 67 | Temp 97.3°F | Resp 21 | Ht 64.0 in | Wt 130.0 lb

## 2012-05-19 DIAGNOSIS — R1110 Vomiting, unspecified: Secondary | ICD-10-CM | POA: Diagnosis not present

## 2012-05-19 DIAGNOSIS — R110 Nausea: Secondary | ICD-10-CM | POA: Diagnosis not present

## 2012-05-19 DIAGNOSIS — K219 Gastro-esophageal reflux disease without esophagitis: Secondary | ICD-10-CM | POA: Diagnosis not present

## 2012-05-19 DIAGNOSIS — R1310 Dysphagia, unspecified: Secondary | ICD-10-CM | POA: Diagnosis not present

## 2012-05-19 DIAGNOSIS — R109 Unspecified abdominal pain: Secondary | ICD-10-CM

## 2012-05-19 DIAGNOSIS — I10 Essential (primary) hypertension: Secondary | ICD-10-CM | POA: Diagnosis not present

## 2012-05-19 MED ORDER — SODIUM CHLORIDE 0.9 % IV SOLN: 500.0000 mL | INTRAVENOUS | Status: DC

## 2012-05-19 NOTE — Op Note (Signed)
[facility] [address] [place], [address]   ENDOSCOPY PROCEDURE REPORT  PATIENT: Christine [name], Christine [name]  MR#: [id number] BIRTHDATE: [birth year] , 79  yrs. old GENDER: Female ENDOSCOPIST: Roxy Cedar, MD REFERRED BY:  Rodrigo Ran, M.D. PROCEDURE DATE:  05/19/2012 PROCEDURE:  EGD, diagnostic ASA CLASS:     Class II INDICATIONS:  Nausea.   Epigastric "burning" pain.  Vague dysphagia MEDICATIONS: propofol (Diprivan) 80mg  IV TOPICAL ANESTHETIC: Cetacaine Spray  DESCRIPTION OF PROCEDURE: After the risks benefits and alternatives of the procedure were thoroughly explained, informed consent was obtained.  The [facility] [id number] endoscope was introduced through the mouth and advanced to the second portion of the duodenum. Without limitations.  The instrument was slowly withdrawn as the mucosa was fully examined.      EXAM: The upper, middle and distal third of the esophagus were carefully inspected and no abnormalities were noted.  The z-line was well seen at the GEJ.  The endoscope was pushed into the fundus which was normal including a retroflexed view.  The antrum, gastric body, first and second part of the duodenum were unremarkable. Retroflexed views revealed no abnormalities.     The scope was then withdrawn from the patient and the procedure completed.  COMPLICATIONS: There were no complications. ENDOSCOPIC IMPRESSION: 1. Normal EGD  RECOMMENDATIONS: 1.  Continue Dexilant (samples provided yesterday) 2.  Keep abdominal ultrasound appointment  REPEAT EXAM:  eSigned:  Roxy Cedar, MD 05/19/2012 10:58 AM   HQ:IONG Waynard Edwards, MD and The Patient

## 2012-05-19 NOTE — Progress Notes (Addendum)
Patient did not experience any of the following events: a burn prior to discharge; a fall within the facility; wrong site/side/patient/procedure/implant event; or a hospital transfer or hospital admission upon discharge from the facility. (G8907)Patient did not have preoperative order for IV antibiotic SSI prophylaxis. [phone])  Pt spent 10  mins in procedure room for recovery, no bay was open at time pt was ready to come to recovery, recovered in recovery for  mins-adm

## 2012-05-19 NOTE — Patient Instructions (Addendum)

## 2012-05-20 ENCOUNTER — Telehealth: Payer: Self-pay | Admitting: *Deleted

## 2012-05-20 NOTE — Telephone Encounter (Signed)
  Follow up Call-  Call back number 05/19/2012  Post procedure Call Back phone  # [phone]   Permission to leave phone message Yes     Patient questions:  Do you have a fever, pain , or abdominal swelling? no Pain Score  0 *  Have you tolerated food without any problems? yes  Have you been able to return to your normal activities? yes  Do you have any questions about your discharge instructions: Diet   no Medications  no Follow up visit  no  Do you have questions or concerns about your Care? no  Actions: * If pain score is 4 or above: No action needed, pain <4.  Pt stated that she was nauseated when she got up but that was happening to her before she came for the procedure

## 2012-05-24 ENCOUNTER — Ambulatory Visit (HOSPITAL_COMMUNITY)
Admission: RE | Admit: 2012-05-24 | Discharge: 2012-05-24 | Disposition: A | Discharge status: Home / Self Care | Payer: Medicare Other | Source: Ambulatory Visit | Attending: Internal Medicine | Admitting: Internal Medicine

## 2012-05-24 DIAGNOSIS — Q619 Cystic kidney disease, unspecified: Secondary | ICD-10-CM | POA: Diagnosis not present

## 2012-05-24 DIAGNOSIS — K7689 Other specified diseases of liver: Secondary | ICD-10-CM | POA: Insufficient documentation

## 2012-05-24 DIAGNOSIS — K824 Cholesterolosis of gallbladder: Secondary | ICD-10-CM | POA: Diagnosis not present

## 2012-05-24 DIAGNOSIS — R110 Nausea: Secondary | ICD-10-CM

## 2012-05-24 DIAGNOSIS — I700 Atherosclerosis of aorta: Secondary | ICD-10-CM | POA: Diagnosis not present

## 2012-05-24 DIAGNOSIS — R1310 Dysphagia, unspecified: Secondary | ICD-10-CM

## 2012-05-24 DIAGNOSIS — Z86010 Personal history of colonic polyps: Secondary | ICD-10-CM

## 2012-05-24 DIAGNOSIS — K219 Gastro-esophageal reflux disease without esophagitis: Secondary | ICD-10-CM

## 2012-06-01 DIAGNOSIS — N318 Other neuromuscular dysfunction of bladder: Secondary | ICD-10-CM | POA: Diagnosis not present

## 2012-06-01 DIAGNOSIS — K219 Gastro-esophageal reflux disease without esophagitis: Secondary | ICD-10-CM | POA: Diagnosis not present

## 2012-06-01 DIAGNOSIS — G894 Chronic pain syndrome: Secondary | ICD-10-CM | POA: Diagnosis not present

## 2012-06-01 DIAGNOSIS — I10 Essential (primary) hypertension: Secondary | ICD-10-CM | POA: Diagnosis not present

## 2012-06-01 DIAGNOSIS — IMO0002 Reserved for concepts with insufficient information to code with codable children: Secondary | ICD-10-CM | POA: Diagnosis not present

## 2012-06-01 DIAGNOSIS — F411 Generalized anxiety disorder: Secondary | ICD-10-CM | POA: Diagnosis not present

## 2012-06-16 DIAGNOSIS — IMO0002 Reserved for concepts with insufficient information to code with codable children: Secondary | ICD-10-CM | POA: Diagnosis not present

## 2012-06-20 DIAGNOSIS — L570 Actinic keratosis: Secondary | ICD-10-CM | POA: Diagnosis not present

## 2012-06-20 DIAGNOSIS — D485 Neoplasm of uncertain behavior of skin: Secondary | ICD-10-CM | POA: Diagnosis not present

## 2012-06-24 DIAGNOSIS — R300 Dysuria: Secondary | ICD-10-CM | POA: Diagnosis not present

## 2012-06-24 DIAGNOSIS — R82998 Other abnormal findings in urine: Secondary | ICD-10-CM | POA: Diagnosis not present

## 2012-07-04 DIAGNOSIS — IMO0002 Reserved for concepts with insufficient information to code with codable children: Secondary | ICD-10-CM | POA: Diagnosis not present

## 2012-07-04 DIAGNOSIS — G459 Transient cerebral ischemic attack, unspecified: Secondary | ICD-10-CM | POA: Diagnosis not present

## 2012-07-04 DIAGNOSIS — N318 Other neuromuscular dysfunction of bladder: Secondary | ICD-10-CM | POA: Diagnosis not present

## 2012-07-04 DIAGNOSIS — F411 Generalized anxiety disorder: Secondary | ICD-10-CM | POA: Diagnosis not present

## 2012-07-05 ENCOUNTER — Other Ambulatory Visit: Payer: Self-pay | Admitting: Internal Medicine

## 2012-07-05 DIAGNOSIS — G459 Transient cerebral ischemic attack, unspecified: Secondary | ICD-10-CM

## 2012-07-06 ENCOUNTER — Ambulatory Visit
Admission: RE | Admit: 2012-07-06 | Discharge: 2012-07-06 | Disposition: A | Discharge status: Home / Self Care | Payer: Medicare Other | Source: Ambulatory Visit | Attending: Internal Medicine | Admitting: Internal Medicine

## 2012-07-06 DIAGNOSIS — G459 Transient cerebral ischemic attack, unspecified: Secondary | ICD-10-CM

## 2012-07-07 ENCOUNTER — Other Ambulatory Visit (INDEPENDENT_AMBULATORY_CARE_PROVIDER_SITE_OTHER): Payer: Medicare Other | Admitting: *Deleted

## 2012-07-07 DIAGNOSIS — G459 Transient cerebral ischemic attack, unspecified: Secondary | ICD-10-CM | POA: Diagnosis not present

## 2012-07-12 ENCOUNTER — Encounter: Payer: Self-pay | Admitting: Internal Medicine

## 2012-08-01 DIAGNOSIS — L570 Actinic keratosis: Secondary | ICD-10-CM | POA: Diagnosis not present

## 2012-09-01 DIAGNOSIS — IMO0002 Reserved for concepts with insufficient information to code with codable children: Secondary | ICD-10-CM | POA: Diagnosis not present

## 2012-09-13 DIAGNOSIS — E785 Hyperlipidemia, unspecified: Secondary | ICD-10-CM | POA: Diagnosis not present

## 2012-09-13 DIAGNOSIS — E559 Vitamin D deficiency, unspecified: Secondary | ICD-10-CM | POA: Diagnosis not present

## 2012-09-13 DIAGNOSIS — I10 Essential (primary) hypertension: Secondary | ICD-10-CM | POA: Diagnosis not present

## 2012-09-18 DIAGNOSIS — N390 Urinary tract infection, site not specified: Secondary | ICD-10-CM | POA: Diagnosis not present

## 2012-09-20 DIAGNOSIS — M899 Disorder of bone, unspecified: Secondary | ICD-10-CM | POA: Diagnosis not present

## 2012-09-20 DIAGNOSIS — J309 Allergic rhinitis, unspecified: Secondary | ICD-10-CM | POA: Diagnosis not present

## 2012-09-20 DIAGNOSIS — Z0000 Encounter for general adult medical examination without abnormal findings: Secondary | ICD-10-CM | POA: Diagnosis not present

## 2012-09-20 DIAGNOSIS — D649 Anemia, unspecified: Secondary | ICD-10-CM | POA: Diagnosis not present

## 2012-09-20 DIAGNOSIS — Z23 Encounter for immunization: Secondary | ICD-10-CM | POA: Diagnosis not present

## 2012-09-20 DIAGNOSIS — R209 Unspecified disturbances of skin sensation: Secondary | ICD-10-CM | POA: Diagnosis not present

## 2012-09-20 DIAGNOSIS — E785 Hyperlipidemia, unspecified: Secondary | ICD-10-CM | POA: Diagnosis not present

## 2012-09-20 DIAGNOSIS — G459 Transient cerebral ischemic attack, unspecified: Secondary | ICD-10-CM | POA: Diagnosis not present

## 2012-09-20 DIAGNOSIS — G8929 Other chronic pain: Secondary | ICD-10-CM | POA: Diagnosis not present

## 2012-09-20 DIAGNOSIS — Z124 Encounter for screening for malignant neoplasm of cervix: Secondary | ICD-10-CM | POA: Diagnosis not present

## 2012-10-20 ENCOUNTER — Encounter: Payer: Self-pay | Admitting: Internal Medicine

## 2012-10-20 DIAGNOSIS — R0789 Other chest pain: Secondary | ICD-10-CM | POA: Diagnosis not present

## 2012-10-20 DIAGNOSIS — G609 Hereditary and idiopathic neuropathy, unspecified: Secondary | ICD-10-CM | POA: Diagnosis not present

## 2012-10-20 DIAGNOSIS — Z79899 Other long term (current) drug therapy: Secondary | ICD-10-CM | POA: Diagnosis not present

## 2012-10-20 DIAGNOSIS — G894 Chronic pain syndrome: Secondary | ICD-10-CM | POA: Diagnosis not present

## 2012-11-23 DIAGNOSIS — N318 Other neuromuscular dysfunction of bladder: Secondary | ICD-10-CM | POA: Diagnosis not present

## 2012-11-23 DIAGNOSIS — R300 Dysuria: Secondary | ICD-10-CM | POA: Diagnosis not present

## 2012-11-23 DIAGNOSIS — IMO0002 Reserved for concepts with insufficient information to code with codable children: Secondary | ICD-10-CM | POA: Diagnosis not present

## 2012-11-23 DIAGNOSIS — R809 Proteinuria, unspecified: Secondary | ICD-10-CM | POA: Diagnosis not present

## 2012-11-23 DIAGNOSIS — N3010 Interstitial cystitis (chronic) without hematuria: Secondary | ICD-10-CM | POA: Diagnosis not present

## 2012-11-28 DIAGNOSIS — Z23 Encounter for immunization: Secondary | ICD-10-CM | POA: Diagnosis not present

## 2012-12-08 DIAGNOSIS — Z961 Presence of intraocular lens: Secondary | ICD-10-CM | POA: Diagnosis not present

## 2012-12-08 DIAGNOSIS — H0230 Blepharochalasis unspecified eye, unspecified eyelid: Secondary | ICD-10-CM | POA: Diagnosis not present

## 2012-12-08 DIAGNOSIS — H40019 Open angle with borderline findings, low risk, unspecified eye: Secondary | ICD-10-CM | POA: Diagnosis not present

## 2012-12-08 DIAGNOSIS — H35379 Puckering of macula, unspecified eye: Secondary | ICD-10-CM | POA: Diagnosis not present

## 2012-12-08 DIAGNOSIS — H43399 Other vitreous opacities, unspecified eye: Secondary | ICD-10-CM | POA: Diagnosis not present

## 2013-01-03 DIAGNOSIS — M1990 Unspecified osteoarthritis, unspecified site: Secondary | ICD-10-CM | POA: Diagnosis not present

## 2013-01-03 DIAGNOSIS — G894 Chronic pain syndrome: Secondary | ICD-10-CM | POA: Diagnosis not present

## 2013-01-03 DIAGNOSIS — R109 Unspecified abdominal pain: Secondary | ICD-10-CM | POA: Diagnosis not present

## 2013-01-03 DIAGNOSIS — M79609 Pain in unspecified limb: Secondary | ICD-10-CM | POA: Diagnosis not present

## 2013-01-11 DIAGNOSIS — R82998 Other abnormal findings in urine: Secondary | ICD-10-CM | POA: Diagnosis not present

## 2013-01-17 ENCOUNTER — Other Ambulatory Visit: Payer: Self-pay | Admitting: Urology

## 2013-01-17 ENCOUNTER — Encounter (HOSPITAL_BASED_OUTPATIENT_CLINIC_OR_DEPARTMENT_OTHER): Payer: Self-pay | Admitting: *Deleted

## 2013-01-17 DIAGNOSIS — N3010 Interstitial cystitis (chronic) without hematuria: Secondary | ICD-10-CM | POA: Diagnosis not present

## 2013-01-17 DIAGNOSIS — R109 Unspecified abdominal pain: Secondary | ICD-10-CM | POA: Diagnosis not present

## 2013-01-17 DIAGNOSIS — N3000 Acute cystitis without hematuria: Secondary | ICD-10-CM | POA: Diagnosis not present

## 2013-01-17 DIAGNOSIS — R350 Frequency of micturition: Secondary | ICD-10-CM | POA: Diagnosis not present

## 2013-01-18 ENCOUNTER — Encounter (HOSPITAL_BASED_OUTPATIENT_CLINIC_OR_DEPARTMENT_OTHER): Payer: Self-pay | Admitting: *Deleted

## 2013-01-18 NOTE — Progress Notes (Signed)
PT HAS IMPAIRED MEMORY, LET DAUGHTER COME BACK WITH HER IN PRE-OP.  SPOKE W/ PT AND DAUGHTER WITH INSTRUCTIONS.  NPO AFTER MN. ARRIVE AT [phone]. NEEDS ISTAT AND EKG. WILL TAKE LOSARTAN AM DOS W/ SIP OF WATER.

## 2013-01-20 ENCOUNTER — Ambulatory Visit (HOSPITAL_BASED_OUTPATIENT_CLINIC_OR_DEPARTMENT_OTHER): Admission: RE | Admit: 2013-01-20 | Payer: Medicare Other | Source: Ambulatory Visit | Admitting: Urology

## 2013-01-20 HISTORY — DX: Personal history of colonic polyps: Z86.010

## 2013-01-20 HISTORY — DX: Personal history of transient ischemic attack (TIA), and cerebral infarction without residual deficits: Z86.73

## 2013-01-20 HISTORY — DX: Occlusion and stenosis of bilateral carotid arteries: I65.23

## 2013-01-20 HISTORY — DX: Personal history of other infectious and parasitic diseases: Z86.19

## 2013-01-20 HISTORY — DX: Presence of spectacles and contact lenses: Z97.3

## 2013-01-20 HISTORY — DX: Personal history of colon polyps, unspecified: Z86.0100

## 2013-01-20 SURGERY — CYSTOSCOPY, WITH BLADDER HYDRODISTENSION
Anesthesia: General

## 2013-01-24 DIAGNOSIS — K5900 Constipation, unspecified: Secondary | ICD-10-CM | POA: Diagnosis not present

## 2013-01-24 DIAGNOSIS — F411 Generalized anxiety disorder: Secondary | ICD-10-CM | POA: Diagnosis not present

## 2013-01-24 DIAGNOSIS — Z1212 Encounter for screening for malignant neoplasm of rectum: Secondary | ICD-10-CM | POA: Diagnosis not present

## 2013-01-24 DIAGNOSIS — G4700 Insomnia, unspecified: Secondary | ICD-10-CM | POA: Diagnosis not present

## 2013-01-24 DIAGNOSIS — N3010 Interstitial cystitis (chronic) without hematuria: Secondary | ICD-10-CM | POA: Diagnosis not present

## 2013-01-24 DIAGNOSIS — I10 Essential (primary) hypertension: Secondary | ICD-10-CM | POA: Diagnosis not present

## 2013-01-24 DIAGNOSIS — R413 Other amnesia: Secondary | ICD-10-CM | POA: Diagnosis not present

## 2013-01-24 DIAGNOSIS — IMO0002 Reserved for concepts with insufficient information to code with codable children: Secondary | ICD-10-CM | POA: Diagnosis not present

## 2013-01-24 DIAGNOSIS — M79609 Pain in unspecified limb: Secondary | ICD-10-CM | POA: Diagnosis not present

## 2013-03-10 DIAGNOSIS — Z79899 Other long term (current) drug therapy: Secondary | ICD-10-CM | POA: Diagnosis not present

## 2013-03-10 DIAGNOSIS — G894 Chronic pain syndrome: Secondary | ICD-10-CM | POA: Diagnosis not present

## 2013-03-10 DIAGNOSIS — G5790 Unspecified mononeuropathy of unspecified lower limb: Secondary | ICD-10-CM | POA: Diagnosis not present

## 2013-03-10 DIAGNOSIS — IMO0001 Reserved for inherently not codable concepts without codable children: Secondary | ICD-10-CM | POA: Diagnosis not present

## 2013-03-10 DIAGNOSIS — M2550 Pain in unspecified joint: Secondary | ICD-10-CM | POA: Diagnosis not present

## 2013-05-02 DIAGNOSIS — Z79899 Other long term (current) drug therapy: Secondary | ICD-10-CM | POA: Diagnosis not present

## 2013-05-02 DIAGNOSIS — M79609 Pain in unspecified limb: Secondary | ICD-10-CM | POA: Diagnosis not present

## 2013-05-02 DIAGNOSIS — G5790 Unspecified mononeuropathy of unspecified lower limb: Secondary | ICD-10-CM | POA: Diagnosis not present

## 2013-05-02 DIAGNOSIS — G894 Chronic pain syndrome: Secondary | ICD-10-CM | POA: Diagnosis not present

## 2013-05-11 DIAGNOSIS — R82998 Other abnormal findings in urine: Secondary | ICD-10-CM | POA: Diagnosis not present

## 2013-05-11 DIAGNOSIS — R300 Dysuria: Secondary | ICD-10-CM | POA: Diagnosis not present

## 2013-05-11 DIAGNOSIS — R809 Proteinuria, unspecified: Secondary | ICD-10-CM | POA: Diagnosis not present

## 2013-05-24 DIAGNOSIS — N3010 Interstitial cystitis (chronic) without hematuria: Secondary | ICD-10-CM | POA: Diagnosis not present

## 2013-05-24 DIAGNOSIS — R350 Frequency of micturition: Secondary | ICD-10-CM | POA: Diagnosis not present

## 2013-06-03 ENCOUNTER — Encounter: Payer: Self-pay | Admitting: Internal Medicine

## 2013-06-07 ENCOUNTER — Other Ambulatory Visit: Payer: Self-pay | Admitting: Urology

## 2013-06-14 ENCOUNTER — Encounter (HOSPITAL_BASED_OUTPATIENT_CLINIC_OR_DEPARTMENT_OTHER): Payer: Self-pay | Admitting: *Deleted

## 2013-06-15 ENCOUNTER — Encounter (HOSPITAL_BASED_OUTPATIENT_CLINIC_OR_DEPARTMENT_OTHER): Payer: Self-pay | Admitting: *Deleted

## 2013-06-15 NOTE — Progress Notes (Signed)
SPOKE W/ DAUGHTER, PAULA, PT HAS IMPAIRED MEMORY, SHE WILL NEED TO BE WITH PT IN PRE-OP.  NPO AFTER MN. ARRIVE AT 0930.  NEEDS ISTAT AND EKG. WILL TAKE LOSARTAN AM DOS W/ SIPS OF WATER.

## 2013-06-19 NOTE — H&P (Signed)
History of Present Illness   I saw Christine [name] in January for a flare of her interstitial cystitis. She has responded to hydrodistentions in the past. She was scheduled for a repeat hydrodistention, but I do not think it was performed. Her last hydrodistention was in February 2013. She had diffuse glomerulations and no ulcer.   Urinary frequency is stable.  Urinalysis: I reviewed, negative.  Review of systems: No change in bowel or neurologic systems.   Christine [name] in the last 2 weeks thinks she has a urinary tract infection, but she saw Dr Joylene Draft. Apparently she had a negative culture. She says it feels like an infection, a little different that her interstitial cystitis. Recently, she has been having some nighttime foot-on-floor syndrome.   There is no other modifying factors or associated signs or symptoms. There is no other aggravating or relieving factors. The presentation is moderate in severity and persisting.  Urinalysis: I reviewed, negative. Urine sent for culture.    Past Medical History Problems  1. History of Arthritis (V13.4) 2. History of esophageal reflux (V12.79) 3. History of heartburn (V12.79) 4. History of hypercholesterolemia (V12.29) 5. History of hypertension (V12.59) 6. History of kidney stones (V13.01) 7. History of Ischemic Stroke (434.90)  Surgical History Problems  1. History of Anterior Colporrhaphy, Repair Of Cystocele 2. History of Bladder Irrigation 3. History of Bladder Irrigation 4. History of Bladder Irrigation 5. History of Cystoscopy With Dilation Of Bladder 6. History of Cystoscopy With Dilation Of Bladder 7. History of Cystoscopy With Dilation Of Bladder 8. History of Hysterectomy 9. History of Sacrospinous Ligament Fixation For Posthysterectomy Prolapse 10. History of Vaginal Surg Insertion Of Mesh For Pelvic Floor Repair  Current Meds 1. Aspirin 325 MG Oral Tablet;  Therapy: (Recorded:22Oct2009) to Recorded 2. Avapro 150 MG Oral Tablet;   Therapy: (Recorded:08Apr2008) to Recorded 3. B-12 TABS;  Therapy: (Recorded:13Jan2015) to Recorded 4. Fluvastatin Sodium 40 MG Oral Capsule;  Therapy: [phone] to Recorded 5. LORazepam TABS;  Therapy: (Recorded:22Oct2009) to Recorded 6. Losartan Potassium 50 MG Oral Tablet;  Therapy: [id number] to Recorded 7. Lyrica 50 MG Oral Capsule;  Therapy: 17Sep2014 to Recorded 8. Nucynta ER 100 MG Oral Tablet Extended Release 12 Hour;  Therapy: [id number] to Recorded 9. Pantoprazole Sodium 40 MG Oral Tablet Delayed Release;  Therapy: 30Jun2014 to Recorded 10. Pyridium TABS;   Therapy: (Recorded:13Jan2015) to Recorded 11. VESIcare TABS;   Therapy: (Recorded:13Jan2015) to Recorded 12. Vitamin D CAPS;   Therapy: (Recorded:22Oct2009) to Recorded  Allergies Medication  1. Gabapentin TABS 2. Gelnique GEL  Family History Problems  1. Family history of Death In The Family Father   34 2. Family history of Death In The Family Mother   1973-03-17. Family history of Family Health Status Number Of Children   1 son 1 daughter 4. Family history of Urologic Disorder (V18.7)   Kidney stones  Social History Problems  1. Denied: Alcohol Use 2. Caffeine Use   1 per day 3. Exercise Habits   Patient uses her Ab Lounger every other day and she walks to and from her mailbox     daily. Note on a regular exercise. 4. Marital History - Currently Married 5. Never A Smoker 6. Occupation:   Retired 27. Self-reliant In Usual Daily Activities 8. Denied: Tobacco Use  Vitals Vital Signs [Data Includes: Last 1 Day]  Recorded: [id number] 01:16PM  Blood Pressure: 131 / 65 Temperature: 98.3 F Heart Rate: 71  Results/Data Urine [Data Includes: Last 1 Day]   [id number]  COLOR  YELLOW   APPEARANCE CLEAR   SPECIFIC GRAVITY <1.005   pH 6.0   GLUCOSE NEG mg/dL  BILIRUBIN NEG   KETONE NEG mg/dL  BLOOD NEG   PROTEIN NEG mg/dL  UROBILINOGEN 0.2 mg/dL  NITRITE NEG   LEUKOCYTE ESTERASE NEG     Assessment Assessed  1. Chronic interstitial cystitis without hematuria (595.1) 2. Increased urinary frequency (788.41)  Plan Chronic interstitial cystitis without hematuria  1. URINE CULTURE; Status:In Progress - Specimen/Data Collected;   Done: [id number] 2. Follow-up Week x 4 Office  Follow-up  Status: Hold For - Date of Service  Requested for:  [phone] Urinary urgency  3. Start: VESIcare 5 MG Oral Tablet; a tablet daily for 30 days  Discussion/Summary   Christine [name] likely has a flare of her interstitial cystitis. Treatment options are limited. Oral hydrodistention discussed.   I doubt that Christine [name] has an infection. She is bothered by the foot-on-floor syndrome and she understands it is likely due to her flare as well. I discussed rescue treatments that have had mixed results in the past versus hydrodistention versus watchful waiting versus antimuscarinics with hit and miss. I was a little bit surprised that Christine [name] perhaps thought there might be other options. I nicely asked her what her goals were, ie, pain or incontinence, and repeated our options today. She is going to think things over and try VESIcare 5 mg samples and prescription. She understands this generally does not help pain. She understands if we repeat the hydrodistention I cannot promise it would help a lot of her symptoms, but it has in the past. Reassess in a month.   After a thorough review of the management options for the patient's condition the patient  elected to proceed with surgical therapy as noted above. We have discussed the potential benefits and risks of the procedure, side effects of the proposed treatment, the likelihood of the patient achieving the goals of the procedure, and any potential problems that might occur during the procedure or recuperation. Informed consent has been obtained.

## 2013-06-20 ENCOUNTER — Encounter (HOSPITAL_BASED_OUTPATIENT_CLINIC_OR_DEPARTMENT_OTHER): Payer: Self-pay | Admitting: *Deleted

## 2013-06-20 ENCOUNTER — Encounter (HOSPITAL_BASED_OUTPATIENT_CLINIC_OR_DEPARTMENT_OTHER): Payer: Medicare Other | Admitting: Anesthesiology

## 2013-06-20 ENCOUNTER — Other Ambulatory Visit: Payer: Self-pay

## 2013-06-20 ENCOUNTER — Ambulatory Visit (HOSPITAL_BASED_OUTPATIENT_CLINIC_OR_DEPARTMENT_OTHER)
Admission: RE | Admit: 2013-06-20 | Discharge: 2013-06-20 | Disposition: A | Discharge status: Home / Self Care | Payer: Medicare Other | Source: Ambulatory Visit | Attending: Urology | Admitting: Urology

## 2013-06-20 ENCOUNTER — Encounter (HOSPITAL_BASED_OUTPATIENT_CLINIC_OR_DEPARTMENT_OTHER)
Admission: RE | Disposition: A | Discharge status: Home / Self Care | Payer: Self-pay | Source: Ambulatory Visit | Attending: Urology

## 2013-06-20 ENCOUNTER — Ambulatory Visit (HOSPITAL_BASED_OUTPATIENT_CLINIC_OR_DEPARTMENT_OTHER): Payer: Medicare Other | Admitting: Anesthesiology

## 2013-06-20 DIAGNOSIS — Z8673 Personal history of transient ischemic attack (TIA), and cerebral infarction without residual deficits: Secondary | ICD-10-CM | POA: Diagnosis not present

## 2013-06-20 DIAGNOSIS — M129 Arthropathy, unspecified: Secondary | ICD-10-CM | POA: Insufficient documentation

## 2013-06-20 DIAGNOSIS — Z7982 Long term (current) use of aspirin: Secondary | ICD-10-CM | POA: Diagnosis not present

## 2013-06-20 DIAGNOSIS — N3010 Interstitial cystitis (chronic) without hematuria: Secondary | ICD-10-CM | POA: Diagnosis not present

## 2013-06-20 DIAGNOSIS — K219 Gastro-esophageal reflux disease without esophagitis: Secondary | ICD-10-CM | POA: Insufficient documentation

## 2013-06-20 DIAGNOSIS — Z79899 Other long term (current) drug therapy: Secondary | ICD-10-CM | POA: Insufficient documentation

## 2013-06-20 DIAGNOSIS — E7800 Pure hypercholesterolemia, unspecified: Secondary | ICD-10-CM | POA: Diagnosis not present

## 2013-06-20 DIAGNOSIS — I10 Essential (primary) hypertension: Secondary | ICD-10-CM | POA: Diagnosis not present

## 2013-06-20 DIAGNOSIS — N949 Unspecified condition associated with female genital organs and menstrual cycle: Secondary | ICD-10-CM | POA: Diagnosis not present

## 2013-06-20 HISTORY — DX: Other amnesia: R41.3

## 2013-06-20 HISTORY — DX: Personal history of other infectious and parasitic diseases: Z86.19

## 2013-06-20 HISTORY — PX: CYSTO WITH HYDRODISTENSION: SHX5453

## 2013-06-20 LAB — POCT I-STAT 4, (NA,K, GLUC, HGB,HCT)
Glucose, Bld: 95 mg/dL (ref 70–99)
HCT: 41 % (ref 36.0–46.0)
Hemoglobin: 13.9 g/dL (ref 12.0–15.0)
POTASSIUM: 3.9 meq/L (ref 3.7–5.3)
SODIUM: 140 meq/L (ref 137–147)

## 2013-06-20 SURGERY — CYSTOSCOPY, WITH BLADDER HYDRODISTENSION
Anesthesia: General

## 2013-06-20 MED ORDER — FENTANYL CITRATE 0.05 MG/ML IJ SOLN
25.0000 ug | INTRAMUSCULAR | Status: DC | PRN
  Filled 2013-06-20: qty 1

## 2013-06-20 MED ORDER — BUPIVACAINE HCL 0.5 % IJ SOLN: INTRAMUSCULAR | Status: DC | PRN

## 2013-06-20 MED ORDER — PROPOFOL INFUSION 10 MG/ML OPTIME
INTRAVENOUS | Status: DC | PRN
  Administered 2013-06-20: 50 mL via INTRAVENOUS
  Administered 2013-06-20: 100 mL via INTRAVENOUS

## 2013-06-20 MED ORDER — DEXAMETHASONE SODIUM PHOSPHATE 10 MG/ML IJ SOLN
INTRAMUSCULAR | Status: DC | PRN
  Administered 2013-06-20: 10 mg via INTRAVENOUS

## 2013-06-20 MED ORDER — FENTANYL CITRATE 0.05 MG/ML IJ SOLN
INTRAMUSCULAR | Status: DC | PRN
Start: 2013-06-20 — End: 2013-06-20
  Administered 2013-06-20: 25 ug via INTRAVENOUS
  Administered 2013-06-20: 50 ug via INTRAVENOUS

## 2013-06-20 MED ORDER — CIPROFLOXACIN IN D5W 400 MG/200ML IV SOLN
400.0000 mg | INTRAVENOUS | Status: AC
  Administered 2013-06-20: 400 mg via INTRAVENOUS
  Filled 2013-06-20: qty 200

## 2013-06-20 MED ORDER — FENTANYL CITRATE 0.05 MG/ML IJ SOLN
INTRAMUSCULAR | Status: AC
Start: 2013-06-20 — End: 2013-06-20
  Filled 2013-06-20: qty 4

## 2013-06-20 MED ORDER — CEFTRIAXONE SODIUM 2 G IJ SOLR
INTRAMUSCULAR | Status: AC
  Filled 2013-06-20: qty 2

## 2013-06-20 MED ORDER — HYDROCODONE-ACETAMINOPHEN 5-325 MG PO TABS: 1.0000 | ORAL_TABLET | Freq: Four times a day (QID) | ORAL | Status: DC | PRN

## 2013-06-20 MED ORDER — LIDOCAINE HCL (CARDIAC) 20 MG/ML IV SOLN
INTRAVENOUS | Status: DC | PRN
  Administered 2013-06-20: 50 mg via INTRAVENOUS

## 2013-06-20 MED ORDER — STERILE WATER FOR IRRIGATION IR SOLN
Status: DC | PRN
  Administered 2013-06-20: 3000 mL

## 2013-06-20 MED ORDER — MIDAZOLAM HCL 2 MG/2ML IJ SOLN
INTRAMUSCULAR | Status: AC
  Filled 2013-06-20: qty 2

## 2013-06-20 MED ORDER — LACTATED RINGERS IV SOLN
INTRAVENOUS | Status: DC
  Administered 2013-06-20: 10:00:00 via INTRAVENOUS
  Filled 2013-06-20: qty 1000

## 2013-06-20 MED ORDER — BUPIVACAINE HCL (PF) 0.5 % IJ SOLN
INTRAMUSCULAR | Status: DC | PRN
  Administered 2013-06-20: 11:00:00 via INTRAVESICAL

## 2013-06-20 MED ORDER — ONDANSETRON HCL 4 MG/2ML IJ SOLN
INTRAMUSCULAR | Status: DC | PRN
Start: 2013-06-20 — End: 2013-06-20
  Administered 2013-06-20 (×2): 4 mg via INTRAVENOUS

## 2013-06-20 MED ORDER — KETOROLAC TROMETHAMINE 30 MG/ML IJ SOLN
15.0000 mg | Freq: Once | INTRAMUSCULAR | Status: DC | PRN
  Filled 2013-06-20: qty 1

## 2013-06-20 MED ORDER — PROMETHAZINE HCL 25 MG/ML IJ SOLN
6.2500 mg | INTRAMUSCULAR | Status: DC | PRN
  Filled 2013-06-20: qty 1

## 2013-06-20 MED ORDER — CIPROFLOXACIN HCL 250 MG PO TABS: 250.0000 mg | ORAL_TABLET | Freq: Two times a day (BID) | ORAL | Status: DC

## 2013-06-20 SURGICAL SUPPLY — 21 items
BAG DRAIN URO-CYSTO SKYTR STRL (DRAIN) ×3 IMPLANT
CANISTER SUCT LVC 12 LTR MEDI- (MISCELLANEOUS) ×3 IMPLANT
CATH FOLEY 2WAY SLVR  5CC 18FR (CATHETERS)
CATH FOLEY 2WAY SLVR 5CC 18FR (CATHETERS) IMPLANT
CATH ROBINSON RED A/P 12FR (CATHETERS) IMPLANT
CATH ROBINSON RED A/P 14FR (CATHETERS) ×3 IMPLANT
CLOTH BEACON ORANGE TIMEOUT ST (SAFETY) ×3 IMPLANT
DRAPE CAMERA CLOSED 9X96 (DRAPES) ×3 IMPLANT
ELECT REM PT RETURN 9FT ADLT (ELECTROSURGICAL)
ELECTRODE REM PT RTRN 9FT ADLT (ELECTROSURGICAL) IMPLANT
GLOVE BIO SURGEON STRL SZ7.5 (GLOVE) ×3 IMPLANT
GLOVE BIOGEL PI IND STRL 7.5 (GLOVE) ×2 IMPLANT
GLOVE BIOGEL PI INDICATOR 7.5 (GLOVE) ×4
GOWN STRL REIN XL XLG (GOWN DISPOSABLE) IMPLANT
GOWN STRL REUS W/TWL XL LVL3 (GOWN DISPOSABLE) ×3 IMPLANT
NDL SAFETY ECLIPSE 18X1.5 (NEEDLE) ×1 IMPLANT
NEEDLE HYPO 18GX1.5 SHARP (NEEDLE) ×2
PACK CYSTOSCOPY (CUSTOM PROCEDURE TRAY) ×3 IMPLANT
SUT SILK 0 TIES 10X30 (SUTURE) IMPLANT
SYR 20CC LL (SYRINGE) ×3 IMPLANT
WATER STERILE IRR 3000ML UROMA (IV SOLUTION) ×3 IMPLANT

## 2013-06-20 NOTE — Anesthesia Procedure Notes (Signed)
Procedure Name: LMA Insertion Date/Time: 06/20/2013 11:08 AM Performed by: Wanita Chamberlain Pre-anesthesia Checklist: Patient identified, Timeout performed, Emergency Drugs available, Suction available and Patient being monitored Patient Re-evaluated:Patient Re-evaluated prior to inductionOxygen Delivery Method: Circle system utilized Preoxygenation: Pre-oxygenation with 100% oxygen Intubation Type: IV induction LMA: LMA inserted LMA Size: 3.0 Number of attempts: 2 (#4 LMA too large for pt. mouth) Placement Confirmation: positive ETCO2 Tube secured with: Tape Dental Injury: Teeth and Oropharynx as per pre-operative assessment

## 2013-06-20 NOTE — Transfer of Care (Signed)
Immediate Anesthesia Transfer of Care Note  Patient: Christine [name]  Procedure(s) Performed: Procedure(s): CYSTOSCOPY/HYDRODISTENSION/INSTALLATION OF PYRIDIUM -MARCAINE 0.5%. (N/A)  Patient Location: PACU  Anesthesia Type:General  Level of Consciousness: awake, alert , oriented and patient cooperative  Airway & Oxygen Therapy: Patient Spontanous Breathing and Patient connected to nasal cannula oxygen  Post-op Assessment: Report given to PACU RN and Post -op Vital signs reviewed and stable  Post vital signs: Reviewed and stable  Complications: No apparent anesthesia complications

## 2013-06-20 NOTE — Discharge Instructions (Addendum)
I have reviewed discharge instructions in detail with the patient. They will follow-up with me or their physician as scheduled. My nurse will also be calling the patients as per protocol.  °Post Anesthesia Home Care Instructions ° °Activity: °Get plenty of rest for the remainder of the day. A responsible adult should stay with you for 24 hours following the procedure.  °For the next 24 hours, DO NOT: °-Drive a car °-Operate machinery °-Drink alcoholic beverages °-Take any medication unless instructed by your physician °-Make any legal decisions or sign important papers. ° °Meals: °Start with liquid foods such as gelatin or soup. Progress to regular foods as tolerated. Avoid greasy, spicy, heavy foods. If nausea and/or vomiting occur, drink only clear liquids until the nausea and/or vomiting subsides. Call your physician if vomiting continues. ° °Special Instructions/Symptoms: °Your throat may feel dry or sore from the anesthesia or the breathing tube placed in your throat during surgery. If this causes discomfort, gargle with warm salt water. The discomfort should disappear within 24 hours. °CYSTOSCOPY HOME CARE INSTRUCTIONS ° °Activity: °Rest for the remainder of the day.  Do not drive or operate equipment today.  You may resume normal activities in one to two days as instructed by your physician.  ° °Meals: °Drink plenty of liquids and eat light foods such as gelatin or soup this evening.  You may return to a normal meal plan tomorrow. ° °Return to Work: °You may return to work in one to two days or as instructed by your physician. ° °Special Instructions / Symptoms: °Call your physician if any of these symptoms occur: ° ° -persistent or heavy bleeding ° -bleeding which continues after first few urination ° -large blood clots that are difficult to pass ° -urine stream diminishes or stops completely ° -fever equal to or higher than 101 degrees Farenheit. ° -cloudy urine with a strong, foul odor ° -severe  pain ° °Females should always wipe from front to back after elimination.  You may feel some burning pain when you urinate.  This should disappear with time.  Applying moist heat to the lower abdomen or a hot tub bath may help relieve the pain. \ ° °Follow-Up / Date of Return Visit to Your Physician:  *** °Call for an appointment to arrange follow-up. ° °Patient Signature:  ________________________________________________________ ° °Nurse's Signature:  ________________________________________________________ ° °

## 2013-06-20 NOTE — Interval H&P Note (Signed)
History and Physical Interval Note:  06/20/2013 7:17 AM  Christine [name]  has presented today for surgery, with the diagnosis of PELVIC PAIN  The various methods of treatment have been discussed with the patient and family. After consideration of risks, benefits and other options for treatment, the patient has consented to  Procedure(s): CYSTOSCOPY/HYDRODISTENSION/INSTALLATION (N/A) as a surgical intervention .  The patient's history has been reviewed, patient examined, no change in status, stable for surgery.  I have reviewed the patient's chart and labs.  Questions were answered to the patient's satisfaction.     Joshia Kitchings A

## 2013-06-20 NOTE — Anesthesia Preprocedure Evaluation (Signed)
Anesthesia Evaluation  Patient identified by MRN, date of birth, ID band Patient awake    Reviewed: Allergy & Precautions, H&P , NPO status , Patient's Chart, lab work & pertinent test results  Airway Mallampati: II TM Distance: >3 FB Neck ROM: Full    Dental no notable dental hx.    Pulmonary neg pulmonary ROS,  breath sounds clear to auscultation  Pulmonary exam normal       Cardiovascular hypertension, Pt. on medications Rhythm:Regular Rate:Normal     Neuro/Psych negative neurological ROS  negative psych ROS   GI/Hepatic negative GI ROS, Neg liver ROS,   Endo/Other  negative endocrine ROS  Renal/GU negative Renal ROS  negative genitourinary   Musculoskeletal negative musculoskeletal ROS (+)   Abdominal   Peds negative pediatric ROS (+)  Hematology negative hematology ROS (+)   Anesthesia Other Findings   Reproductive/Obstetrics negative OB ROS                           Anesthesia Physical Anesthesia Plan  ASA: II  Anesthesia Plan: General   Post-op Pain Management:    Induction: Intravenous  Airway Management Planned: LMA  Additional Equipment:   Intra-op Plan:   Post-operative Plan:   Informed Consent: I have reviewed the patients History and Physical, chart, labs and discussed the procedure including the risks, benefits and alternatives for the proposed anesthesia with the patient or authorized representative who has indicated his/her understanding and acceptance.   Dental advisory given  Plan Discussed with: CRNA and Surgeon  Anesthesia Plan Comments:         Anesthesia Quick Evaluation

## 2013-06-20 NOTE — Op Note (Signed)
Preoperative diagnosis: Interstitial cystitis Postoperative diagnosis: Interstitial cystitis Surgery: Cystoscopy and bladder hydrodistention and bladder installation therapy Surgeon: Dr. Nicki Reaper MacDiarmid  The patient has the above diagnoses and consented above procedure. Leg position was excellent. Preoperative antibiotics given.  Bladder mucosa and trigone were normal. There is no stitch or foreign body or carcinoma utilizing a 21 French cystoscope. She was hydrodistended for 5 minutes to 600 mL. Bladder was emptied.  On reinspection there was diffuse glomerulations but no ulcers. There is no bladder injury. Bladder was emptied  A separate procedure a red rubber catheter was inserted and 15 cc of 0.5% Marcaine was 400 mg of peridium  Patient was taken recover room

## 2013-06-21 ENCOUNTER — Encounter (HOSPITAL_BASED_OUTPATIENT_CLINIC_OR_DEPARTMENT_OTHER): Payer: Self-pay | Admitting: Urology

## 2013-06-21 NOTE — Anesthesia Postprocedure Evaluation (Signed)
  Anesthesia Post-op Note  Patient: Christine [name]  Procedure(s) Performed: Procedure(s) (LRB): CYSTOSCOPY/HYDRODISTENSION/INSTALLATION OF PYRIDIUM -MARCAINE 0.5%. (N/A)  Patient Location: PACU  Anesthesia Type: General  Level of Consciousness: awake and alert   Airway and Oxygen Therapy: Patient Spontanous Breathing  Post-op Pain: mild  Post-op Assessment: Post-op Vital signs reviewed, Patient's Cardiovascular Status Stable, Respiratory Function Stable, Patent Airway and No signs of Nausea or vomiting  Last Vitals:  Filed Vitals:   06/20/13 1229  BP: 136/64  Pulse: 65  Temp: 36.6 C  Resp: 16    Post-op Vital Signs: stable   Complications: No apparent anesthesia complications

## 2013-07-16 ENCOUNTER — Emergency Department (HOSPITAL_COMMUNITY): Payer: Medicare Other

## 2013-07-16 ENCOUNTER — Observation Stay (HOSPITAL_COMMUNITY)
Admission: EM | Admit: 2013-07-16 | Discharge: 2013-07-17 | Disposition: A | Discharge status: Home / Self Care | Payer: Medicare Other | Attending: Internal Medicine | Admitting: Internal Medicine

## 2013-07-16 ENCOUNTER — Encounter (HOSPITAL_COMMUNITY): Payer: Self-pay | Admitting: Emergency Medicine

## 2013-07-16 DIAGNOSIS — Z86010 Personal history of colonic polyps: Secondary | ICD-10-CM

## 2013-07-16 DIAGNOSIS — K648 Other hemorrhoids: Secondary | ICD-10-CM

## 2013-07-16 DIAGNOSIS — Z79899 Other long term (current) drug therapy: Secondary | ICD-10-CM | POA: Insufficient documentation

## 2013-07-16 DIAGNOSIS — K5900 Constipation, unspecified: Secondary | ICD-10-CM

## 2013-07-16 DIAGNOSIS — K219 Gastro-esophageal reflux disease without esophagitis: Secondary | ICD-10-CM | POA: Insufficient documentation

## 2013-07-16 DIAGNOSIS — R1013 Epigastric pain: Secondary | ICD-10-CM

## 2013-07-16 DIAGNOSIS — K5730 Diverticulosis of large intestine without perforation or abscess without bleeding: Secondary | ICD-10-CM

## 2013-07-16 DIAGNOSIS — K209 Esophagitis, unspecified without bleeding: Secondary | ICD-10-CM

## 2013-07-16 DIAGNOSIS — I739 Peripheral vascular disease, unspecified: Secondary | ICD-10-CM | POA: Insufficient documentation

## 2013-07-16 DIAGNOSIS — R634 Abnormal weight loss: Secondary | ICD-10-CM

## 2013-07-16 DIAGNOSIS — D126 Benign neoplasm of colon, unspecified: Secondary | ICD-10-CM

## 2013-07-16 DIAGNOSIS — Z8673 Personal history of transient ischemic attack (TIA), and cerebral infarction without residual deficits: Secondary | ICD-10-CM | POA: Insufficient documentation

## 2013-07-16 DIAGNOSIS — R079 Chest pain, unspecified: Secondary | ICD-10-CM | POA: Diagnosis present

## 2013-07-16 DIAGNOSIS — I5030 Unspecified diastolic (congestive) heart failure: Secondary | ICD-10-CM | POA: Insufficient documentation

## 2013-07-16 DIAGNOSIS — K625 Hemorrhage of anus and rectum: Secondary | ICD-10-CM

## 2013-07-16 DIAGNOSIS — I480 Paroxysmal atrial fibrillation: Secondary | ICD-10-CM | POA: Diagnosis present

## 2013-07-16 DIAGNOSIS — R0789 Other chest pain: Principal | ICD-10-CM | POA: Insufficient documentation

## 2013-07-16 DIAGNOSIS — K644 Residual hemorrhoidal skin tags: Secondary | ICD-10-CM

## 2013-07-16 DIAGNOSIS — I4891 Unspecified atrial fibrillation: Secondary | ICD-10-CM | POA: Diagnosis not present

## 2013-07-16 DIAGNOSIS — R0602 Shortness of breath: Secondary | ICD-10-CM | POA: Diagnosis not present

## 2013-07-16 DIAGNOSIS — E785 Hyperlipidemia, unspecified: Secondary | ICD-10-CM | POA: Insufficient documentation

## 2013-07-16 DIAGNOSIS — N390 Urinary tract infection, site not specified: Secondary | ICD-10-CM | POA: Diagnosis present

## 2013-07-16 DIAGNOSIS — I517 Cardiomegaly: Secondary | ICD-10-CM | POA: Insufficient documentation

## 2013-07-16 DIAGNOSIS — I5041 Acute combined systolic (congestive) and diastolic (congestive) heart failure: Secondary | ICD-10-CM | POA: Diagnosis not present

## 2013-07-16 DIAGNOSIS — I10 Essential (primary) hypertension: Secondary | ICD-10-CM | POA: Diagnosis not present

## 2013-07-16 DIAGNOSIS — Z7982 Long term (current) use of aspirin: Secondary | ICD-10-CM | POA: Insufficient documentation

## 2013-07-16 DIAGNOSIS — I080 Rheumatic disorders of both mitral and aortic valves: Secondary | ICD-10-CM | POA: Insufficient documentation

## 2013-07-16 DIAGNOSIS — K3189 Other diseases of stomach and duodenum: Secondary | ICD-10-CM

## 2013-07-16 DIAGNOSIS — R12 Heartburn: Secondary | ICD-10-CM

## 2013-07-16 DIAGNOSIS — R143 Flatulence: Secondary | ICD-10-CM

## 2013-07-16 DIAGNOSIS — R109 Unspecified abdominal pain: Secondary | ICD-10-CM

## 2013-07-16 DIAGNOSIS — R110 Nausea: Secondary | ICD-10-CM

## 2013-07-16 DIAGNOSIS — R141 Gas pain: Secondary | ICD-10-CM

## 2013-07-16 DIAGNOSIS — R142 Eructation: Secondary | ICD-10-CM

## 2013-07-16 LAB — URINALYSIS, ROUTINE W REFLEX MICROSCOPIC
Bilirubin Urine: NEGATIVE
GLUCOSE, UA: NEGATIVE mg/dL
Hgb urine dipstick: NEGATIVE
Ketones, ur: 40 mg/dL — AB
LEUKOCYTES UA: NEGATIVE
Nitrite: POSITIVE — AB
PH: 7.5 (ref 5.0–8.0)
Protein, ur: NEGATIVE mg/dL
SPECIFIC GRAVITY, URINE: 1.009 (ref 1.005–1.030)
Urobilinogen, UA: 1 mg/dL (ref 0.0–1.0)

## 2013-07-16 LAB — COMPREHENSIVE METABOLIC PANEL
ALT: 8 U/L (ref 0–35)
AST: 17 U/L (ref 0–37)
Albumin: 4.2 g/dL (ref 3.5–5.2)
Alkaline Phosphatase: 64 U/L (ref 39–117)
Anion gap: 17 — ABNORMAL HIGH (ref 5–15)
BUN: 14 mg/dL (ref 6–23)
CALCIUM: 10 mg/dL (ref 8.4–10.5)
CHLORIDE: 102 meq/L (ref 96–112)
CO2: 23 meq/L (ref 19–32)
Creatinine, Ser: 0.95 mg/dL (ref 0.50–1.10)
GFR calc Af Amer: 65 mL/min — ABNORMAL LOW (ref >90)
GFR, EST NON AFRICAN AMERICAN: 56 mL/min — AB (ref >90)
Glucose, Bld: 123 mg/dL — ABNORMAL HIGH (ref 70–99)
Potassium: 3.9 mEq/L (ref 3.7–5.3)
Sodium: 142 mEq/L (ref 137–147)
Total Bilirubin: 0.7 mg/dL (ref 0.3–1.2)
Total Protein: 7.7 g/dL (ref 6.0–8.3)

## 2013-07-16 LAB — CBC WITH DIFFERENTIAL/PLATELET
BASOS ABS: 0 10*3/uL (ref 0.0–0.1)
Basophils Relative: 0 % (ref 0–1)
EOS PCT: 1 % (ref 0–5)
Eosinophils Absolute: 0.1 10*3/uL (ref 0.0–0.7)
HCT: 41.6 % (ref 36.0–46.0)
Hemoglobin: 14.1 g/dL (ref 12.0–15.0)
LYMPHS PCT: 11 % — AB (ref 12–46)
Lymphs Abs: 0.7 10*3/uL (ref 0.7–4.0)
MCH: 30.9 pg (ref 26.0–34.0)
MCHC: 33.9 g/dL (ref 30.0–36.0)
MCV: 91.2 fL (ref 78.0–100.0)
Monocytes Absolute: 0.7 10*3/uL (ref 0.1–1.0)
Monocytes Relative: 11 % (ref 3–12)
NEUTROS ABS: 4.9 10*3/uL (ref 1.7–7.7)
Neutrophils Relative %: 77 % (ref 43–77)
Platelets: 181 10*3/uL (ref 150–400)
RBC: 4.56 MIL/uL (ref 3.87–5.11)
RDW: 13.4 % (ref 11.5–15.5)
WBC: 6.4 10*3/uL (ref 4.0–10.5)

## 2013-07-16 LAB — APTT: aPTT: 30 seconds (ref 24–37)

## 2013-07-16 LAB — URINE MICROSCOPIC-ADD ON

## 2013-07-16 LAB — I-STAT CG4 LACTIC ACID, ED: Lactic Acid, Venous: 1.9 mmol/L (ref 0.5–2.2)

## 2013-07-16 LAB — TROPONIN I
Troponin I: <0.3 ng/mL (ref <0.30)
Troponin I: <0.3 ng/mL (ref <0.30)

## 2013-07-16 LAB — I-STAT TROPONIN, ED: Troponin i, poc: 0.01 ng/mL (ref 0.00–0.08)

## 2013-07-16 LAB — PROTIME-INR
INR: 1 (ref 0.00–1.49)
Prothrombin Time: 13.2 seconds (ref 11.6–15.2)

## 2013-07-16 LAB — HEPARIN LEVEL (UNFRACTIONATED): Heparin Unfractionated: 0.6 IU/mL (ref 0.30–0.70)

## 2013-07-16 MED ORDER — ACETAMINOPHEN 650 MG RE SUPP: 650.0000 mg | Freq: Four times a day (QID) | RECTAL | Status: DC | PRN

## 2013-07-16 MED ORDER — DARIFENACIN HYDROBROMIDE ER 7.5 MG PO TB24
7.5000 mg | ORAL_TABLET | Freq: Every day | ORAL | Status: DC
  Filled 2013-07-16: qty 1

## 2013-07-16 MED ORDER — DEXTROSE 5 % IV SOLN
2.0000 g | Freq: Once | INTRAVENOUS | Status: AC
  Administered 2013-07-16: 2 g via INTRAVENOUS
  Filled 2013-07-16: qty 2

## 2013-07-16 MED ORDER — ONDANSETRON HCL 4 MG/2ML IJ SOLN: 4.0000 mg | Freq: Four times a day (QID) | INTRAMUSCULAR | Status: DC | PRN

## 2013-07-16 MED ORDER — METOPROLOL TARTRATE 12.5 MG HALF TABLET
12.5000 mg | ORAL_TABLET | Freq: Two times a day (BID) | ORAL | Status: DC
  Administered 2013-07-16: 12.5 mg via ORAL
  Filled 2013-07-16 (×4): qty 1

## 2013-07-16 MED ORDER — POLYETHYLENE GLYCOL 3350 17 G PO PACK
17.0000 g | PACK | Freq: Every day | ORAL | Status: DC
  Filled 2013-07-16 (×2): qty 1

## 2013-07-16 MED ORDER — ASPIRIN EC 81 MG PO TBEC
81.0000 mg | DELAYED_RELEASE_TABLET | Freq: Every day | ORAL | Status: DC
  Administered 2013-07-16: 81 mg via ORAL
  Filled 2013-07-16 (×2): qty 1

## 2013-07-16 MED ORDER — ACETAMINOPHEN 325 MG PO TABS: 650.0000 mg | ORAL_TABLET | Freq: Four times a day (QID) | ORAL | Status: DC | PRN

## 2013-07-16 MED ORDER — SODIUM CHLORIDE 0.9 % IV BOLUS (SEPSIS)
1000.0000 mL | Freq: Once | INTRAVENOUS | Status: AC
  Administered 2013-07-16: 1000 mL via INTRAVENOUS

## 2013-07-16 MED ORDER — TRAMADOL HCL 50 MG PO TABS
50.0000 mg | ORAL_TABLET | Freq: Four times a day (QID) | ORAL | Status: DC | PRN
  Administered 2013-07-16: 50 mg via ORAL
  Filled 2013-07-16: qty 1

## 2013-07-16 MED ORDER — PREGABALIN 50 MG PO CAPS: 50.0000 mg | ORAL_CAPSULE | Freq: Every day | ORAL | Status: DC

## 2013-07-16 MED ORDER — ONDANSETRON HCL 4 MG PO TABS: 4.0000 mg | ORAL_TABLET | Freq: Four times a day (QID) | ORAL | Status: DC | PRN

## 2013-07-16 MED ORDER — LORAZEPAM 0.5 MG PO TABS: 0.5000 mg | ORAL_TABLET | Freq: Three times a day (TID) | ORAL | Status: DC | PRN

## 2013-07-16 MED ORDER — SODIUM CHLORIDE 0.9 % IJ SOLN: 3.0000 mL | Freq: Two times a day (BID) | INTRAMUSCULAR | Status: DC

## 2013-07-16 MED ORDER — HEPARIN BOLUS VIA INFUSION
1800.0000 [IU] | Freq: Once | INTRAVENOUS | Status: AC
  Administered 2013-07-16: 1800 [IU] via INTRAVENOUS
  Filled 2013-07-16: qty 1800

## 2013-07-16 MED ORDER — PANTOPRAZOLE SODIUM 40 MG PO TBEC
40.0000 mg | DELAYED_RELEASE_TABLET | Freq: Every day | ORAL | Status: DC
  Filled 2013-07-16: qty 1

## 2013-07-16 MED ORDER — DEXTROSE 5 % IV SOLN
5.0000 mg/h | Freq: Once | INTRAVENOUS | Status: DC
  Filled 2013-07-16: qty 100

## 2013-07-16 MED ORDER — SODIUM CHLORIDE 0.9 % IV BOLUS (SEPSIS)
30.0000 mL/kg | Freq: Once | INTRAVENOUS | Status: AC
  Administered 2013-07-16: 1878 mL via INTRAVENOUS

## 2013-07-16 MED ORDER — HEPARIN (PORCINE) IN NACL 100-0.45 UNIT/ML-% IJ SOLN
1100.0000 [IU]/h | INTRAMUSCULAR | Status: DC
  Administered 2013-07-16 (×2): 1100 [IU]/h via INTRAVENOUS
  Filled 2013-07-16 (×3): qty 250

## 2013-07-16 MED ORDER — PHENAZOPYRIDINE HCL 100 MG PO TABS
100.0000 mg | ORAL_TABLET | Freq: Three times a day (TID) | ORAL | Status: DC
  Administered 2013-07-16 – 2013-07-17 (×4): 100 mg via ORAL
  Filled 2013-07-16 (×8): qty 1

## 2013-07-16 MED ORDER — DEXTROSE 5 % IV SOLN
2.0000 g | INTRAVENOUS | Status: DC
  Administered 2013-07-17: 2 g via INTRAVENOUS
  Filled 2013-07-16: qty 2

## 2013-07-16 MED ORDER — SODIUM CHLORIDE 0.9 % IV SOLN
1000.0000 mL | INTRAVENOUS | Status: DC
  Administered 2013-07-16 – 2013-07-17 (×2): 1000 mL via INTRAVENOUS

## 2013-07-16 MED ORDER — SODIUM CHLORIDE 0.9 % IV BOLUS (SEPSIS): 250.0000 mL | INTRAVENOUS | Status: DC | PRN

## 2013-07-16 MED ORDER — ZOLPIDEM TARTRATE 5 MG PO TABS
5.0000 mg | ORAL_TABLET | Freq: Every evening | ORAL | Status: DC | PRN
  Administered 2013-07-16: 5 mg via ORAL
  Filled 2013-07-16: qty 1

## 2013-07-16 NOTE — ED Notes (Signed)
Pt able to urinate in bedpan after bladder scan.

## 2013-07-16 NOTE — ED Notes (Signed)
Pt had episode of urinary incontinence. Dry linens placed and pt placed back on bedpan.

## 2013-07-16 NOTE — Progress Notes (Signed)
ANTIBIOTIC CONSULT NOTE - INITIAL  Pharmacy Consult for Rocephin Indication: Urosepsis  Allergies  Allergen Reactions  . Gelnique [Oxybutynin] Other (See Comments)    UNKNOWN  . Pentosan Polysulfate Sodium Other (See Comments)    BLURRED VISION/ HEADACHE    Patient Measurements: Weight: 138 lb 0.1 oz (62.6 kg)  Vital Signs: Temp: 98 F (36.7 C) (07/12 0744) Temp src: Oral (07/12 0744) BP: 121/78 mmHg (07/12 0744) Pulse Rate: 113 (07/12 0747) Intake/Output from previous day:   Intake/Output from this shift:    Labs: No results found for this basename: WBC, HGB, PLT, LABCREA, CREATININE,  in the last 72 hours The CrCl is unknown because both a height and weight (above a minimum accepted value) are required for this calculation. No results found for this basename: VANCOTROUGH, VANCOPEAK, VANCORANDOM, GENTTROUGH, GENTPEAK, GENTRANDOM, TOBRATROUGH, TOBRAPEAK, TOBRARND, AMIKACINPEAK, AMIKACINTROU, AMIKACIN,  in the last 72 hours   Microbiology: No results found for this or any previous visit (from the past 720 hour(s)).  Medical History: Past Medical History  Diagnosis Date  . Pelvic pain in female   . Hypertension   . Hyperlipidemia   . Arthritis   . GERD (gastroesophageal reflux disease)   . Interstitial cystitis   . Urgency of urination   . Frequency of urination   . Nocturia   . Diverticulosis   . Osteopenia   . Vitamin D deficiency   . Iron deficiency   . AR (allergic rhinitis)   . History of TIA (transient ischemic attack)   . History of Helicobacter pylori infection   . History of colon polyps     ADENOMATOUS  . Bilateral carotid artery stenosis     MILD --  40% BILATERAL ICA PER DOPPLER JULY 2014  . Wears glasses   . History of shingles   . Impaired memory     Medications:  Scheduled:   Infusions:  . sodium chloride     Followed by  . sodium chloride    . cefTRIAXone (ROCEPHIN)  IV    . sodium chloride     PRN:  Assessment: 12 yof with hx of  interstitial cystitis & incontinence. S/p cystoscopy/hydrodistension/installation of pyridium on 6/16. In ED with c/o dysuria, abdominal burning, chest burning and tachycardic. Pharmacy to dose Rocephin for possible urosepsis.  Tmax: afebrile WBCs: pending Renal: pending  Goal of Therapy:  Appropriate antibiotic dosing for eradication of infection  Plan:  Rocephin 2g IV Q24H Follow up culture results  Kizzie Furnish, PharmD Pager: [phone] 07/16/2013 8:17 AM

## 2013-07-16 NOTE — ED Notes (Signed)
Attempted to call reports Charge RN on floor will call back.

## 2013-07-16 NOTE — ED Notes (Signed)
Portable CXR at bedside.

## 2013-07-16 NOTE — ED Notes (Signed)
Pt and family member report pt has issue of "burning all over her chest" for years now. Reports she has burning right now. Pt has bladder procedure on 6/16, hx of incontinence and urination. Pt reports dysuria started last night 5/10 pain. Reports she has been SOB since last night. Daughter reports this morning pt was gasping for breath, at present pt 96% on room air, able to speak in full sentences.

## 2013-07-16 NOTE — Progress Notes (Signed)
ANTICOAGULATION CONSULT NOTE - Initial Consult  Pharmacy Consult for Heparin Indication: atrial fibrillation  Allergies  Allergen Reactions  . Gabapentin Other (See Comments)    UNKNOWN  . Gelnique [Oxybutynin] Other (See Comments)    UNKNOWN  . Pentosan Polysulfate Sodium Other (See Comments)    BLURRED VISION/ HEADACHE    Patient Measurements: Height: 5' 4.17" (163 cm) Weight: 138 lb 0.1 oz (62.6 kg) IBW/kg (Calculated) : 55.1 Heparin Dosing Weight: 62.6 kg  Vital Signs: Temp: 99.4 F (37.4 C) (07/12 0842) Temp src: Rectal (07/12 0842) BP: 126/43 mmHg (07/12 1015) Pulse Rate: 68 (07/12 1015)  Labs:  Recent Labs  07/16/13 0807  HGB 14.1  HCT 41.6  PLT 181  CREATININE 0.95    Estimated Creatinine Clearance: 42.5 ml/min (by C-G formula based on Cr of 0.95).   Medical History: Past Medical History  Diagnosis Date  . Pelvic pain in female   . Hypertension   . Hyperlipidemia   . Arthritis   . GERD (gastroesophageal reflux disease)   . Interstitial cystitis   . Urgency of urination   . Frequency of urination   . Nocturia   . Diverticulosis   . Osteopenia   . Vitamin D deficiency   . Iron deficiency   . AR (allergic rhinitis)   . History of TIA (transient ischemic attack)   . History of Helicobacter pylori infection   . History of colon polyps     ADENOMATOUS  . Bilateral carotid artery stenosis     MILD --  40% BILATERAL ICA PER DOPPLER JULY 2014  . Wears glasses   . History of shingles   . Impaired memory     Medications:  Scheduled:  . phenazopyridine  100 mg Oral TID WC   Infusions:  . sodium chloride    . [START ON 07/17/2013] cefTRIAXone (ROCEPHIN)  IV     PRN:   Assessment: 80 y.o. female with PMH of HTN, HPL, GERD, h/o TIA presented with int remittent substernal burning pains overnight, associated with diaphoresis, SHOB, palpitations. ECG showed afib which is new, and hypotension. Pharmacy consulted to dose IV heparin.  Baseline PTT,  PT/INR pending  No anticoagulant use PTA other than aspirin 81mg  daily  CBC wnl  CrCl 42 ml/min  Heparin dosing weight 62.6 kg  Goal of Therapy:  Heparin level 0.3-0.7 units/ml Monitor platelets by anticoagulation protocol: Yes   Plan:   Heparin 1800 units IV bolus x 1  Heparin 1100 units/hr IV infusion  Heparin level in 8hrs  Daily heparin level, CBC  Follow up plan for long-term anticoagulation  Peggyann Juba, PharmD, BCPS Pager: [phone] 07/16/2013,11:17 AM

## 2013-07-16 NOTE — Consult Note (Signed)
Referring Physician: Lynlee Stratton is an 80 y.o. female.                       Chief Complaint: Chest pain  HPI: 80 y.o. female with PMH of HTN, HPL, GERD, h/o TIA presented with int remittent substernal burning pains overnight, associated with diaphoresis, SOB, palpitations; Pt reports having similar sympoms in the past related to GERD; This time acid reducer medications did not work.Pt denies exertional symptoms at baseline; She also reports chills, dysuria and found to have probable UTI in ED; no focal neuro symptoms, no n/v/d. ECG showed PAF/new, with hypotension.   Past Medical History  Diagnosis Date  . Pelvic pain in female   . Hypertension   . Hyperlipidemia   . Arthritis   . GERD (gastroesophageal reflux disease)   . Interstitial cystitis   . Urgency of urination   . Frequency of urination   . Nocturia   . Diverticulosis   . Osteopenia   . Vitamin D deficiency   . Iron deficiency   . AR (allergic rhinitis)   . History of TIA (transient ischemic attack)   . History of Helicobacter pylori infection   . History of colon polyps     ADENOMATOUS  . Bilateral carotid artery stenosis     MILD --  40% BILATERAL ICA PER DOPPLER JULY 2014  . Wears glasses   . History of shingles   . Impaired memory       Past Surgical History  Procedure Laterality Date  . Vault suspension with graft/ cystocele repair  09-17-2009  . Ureterolithotomy Bilateral 11-24-2004 LEFT  &  05-29-2006  RIGHT  . Abdominal hysterectomy    . Cysto/ hod/ instillation therapy  10-04-2008  &  05-02-2009  . Cataract extraction w/ intraocular lens  implant, bilateral  2009  . Cysto with hydrodistension  02/24/2011    Procedure: CYSTOSCOPY/HYDRODISTENSION;  Surgeon: Reece Packer, MD;  Location: [facility];  Service: Urology;  Laterality: N/A;  INSTILLATION OF marcaine and  pyridium  . Cysto/ left ureteroscopy w/ left ureteral dilation  04-15-2006  . Cysto with hydrodistension  N/A 06/20/2013    Procedure: CYSTOSCOPY/HYDRODISTENSION/INSTALLATION OF PYRIDIUM -MARCAINE 0.5%.;  Surgeon: Reece Packer, MD;  Location: [place];  Service: Urology;  Laterality: N/A;    History reviewed. No pertinent family history. Social History:  reports that she has never smoked. She has never used smokeless tobacco. She reports that she does not drink alcohol or use illicit drugs.  Allergies:  Allergies  Allergen Reactions  . Gabapentin Other (See Comments)    UNKNOWN  . Gelnique [Oxybutynin] Other (See Comments)    UNKNOWN  . Pentosan Polysulfate Sodium Other (See Comments)    BLURRED VISION/ HEADACHE     (Not in a hospital admission)  Results for orders placed during the hospital encounter of 07/16/13 (from the past 48 hour(s))  CBC WITH DIFFERENTIAL     Status: Abnormal   Collection Time    07/16/13  8:07 AM      Result Value Ref Range   WBC 6.4  4.0 - 10.5 K/uL   RBC 4.56  3.87 - 5.11 MIL/uL   Hemoglobin 14.1  12.0 - 15.0 g/dL   HCT 41.6  36.0 - 46.0 %   MCV 91.2  78.0 - 100.0 fL   MCH 30.9  26.0 - 34.0 pg   MCHC 33.9  30.0 - 36.0 g/dL  RDW 13.4  11.5 - 15.5 %   Platelets 181  150 - 400 K/uL   Neutrophils Relative % 77  43 - 77 %   Neutro Abs 4.9  1.7 - 7.7 K/uL   Lymphocytes Relative 11 (*) 12 - 46 %   Lymphs Abs 0.7  0.7 - 4.0 K/uL   Monocytes Relative 11  3 - 12 %   Monocytes Absolute 0.7  0.1 - 1.0 K/uL   Eosinophils Relative 1  0 - 5 %   Eosinophils Absolute 0.1  0.0 - 0.7 K/uL   Basophils Relative 0  0 - 1 %   Basophils Absolute 0.0  0.0 - 0.1 K/uL  COMPREHENSIVE METABOLIC PANEL     Status: Abnormal   Collection Time    07/16/13  8:07 AM      Result Value Ref Range   Sodium 142  137 - 147 mEq/L   Potassium 3.9  3.7 - 5.3 mEq/L   Chloride 102  96 - 112 mEq/L   CO2 23  19 - 32 mEq/L   Glucose, Bld 123 (*) 70 - 99 mg/dL   BUN 14  6 - 23 mg/dL   Creatinine, Ser 0.95  0.50 - 1.10 mg/dL   Calcium 10.0  8.4 - 10.5 mg/dL   Total  Protein 7.7  6.0 - 8.3 g/dL   Albumin 4.2  3.5 - 5.2 g/dL   AST 17  0 - 37 U/L   ALT 8  0 - 35 U/L   Alkaline Phosphatase 64  39 - 117 U/L   Total Bilirubin 0.7  0.3 - 1.2 mg/dL   GFR calc non Af Amer 56 (*) >90 mL/min   GFR calc Af Amer 65 (*) >90 mL/min   Comment: (NOTE)     The eGFR has been calculated using the CKD EPI equation.     This calculation has not been validated in all clinical situations.     eGFR's persistently <90 mL/min signify possible Chronic Kidney     Disease.   Anion gap 17 (*) 5 - 15  I-STAT CG4 LACTIC ACID, ED     Status: None   Collection Time    07/16/13  8:15 AM      Result Value Ref Range   Lactic Acid, Venous 1.90  0.5 - 2.2 mmol/L  URINALYSIS, ROUTINE W REFLEX MICROSCOPIC     Status: Abnormal   Collection Time    07/16/13  8:41 AM      Result Value Ref Range   Color, Urine ORANGE (*) YELLOW   Comment: BIOCHEMICALS MAY BE AFFECTED BY COLOR   APPearance CLEAR  CLEAR   Specific Gravity, Urine 1.009  1.005 - 1.030   pH 7.5  5.0 - 8.0   Glucose, UA NEGATIVE  NEGATIVE mg/dL   Hgb urine dipstick NEGATIVE  NEGATIVE   Bilirubin Urine NEGATIVE  NEGATIVE   Ketones, ur 40 (*) NEGATIVE mg/dL   Protein, ur NEGATIVE  NEGATIVE mg/dL   Urobilinogen, UA 1.0  0.0 - 1.0 mg/dL   Nitrite POSITIVE (*) NEGATIVE   Leukocytes, UA NEGATIVE  NEGATIVE  URINE MICROSCOPIC-ADD ON     Status: None   Collection Time    07/16/13  8:41 AM      Result Value Ref Range   Bacteria, UA RARE  RARE  Randolm Idol, ED     Status: None   Collection Time    07/16/13 11:09 AM      Result Value Ref  Range   Troponin i, poc 0.01  0.00 - 0.08 ng/mL   Comment 3            Comment: Due to the release kinetics of cTnI,     a negative result within the first hours     of the onset of symptoms does not rule out     myocardial infarction with certainty.     If myocardial infarction is still suspected,     repeat the test at appropriate intervals.   Dg Chest Port 1 View  07/16/2013    CLINICAL DATA:  Shortness of breath.  Chest pain.  EXAM: PORTABLE CHEST - 1 VIEW  COMPARISON:  Chest x-ray 09/30/ 2010.  FINDINGS: Mediastinum and hilar structures normal. Lungs are clear. Heart size normal. No pleural effusion or pneumothorax .  IMPRESSION: No active disease.   Electronically Signed   By: Hector   On: 07/16/2013 08:30    Review Of Systems Respiratory: Negative for cough and shortness of breath.  Cardiovascular: Positive for chest pain (burning).  Gastrointestinal: Positive for abdominal pain (burning). Negative for vomiting.  Genitourinary: Positive for dysuria.  All other systems reviewed and are negative.   Blood pressure 124/62, pulse 69, temperature 99.4 F (37.4 C), temperature source Rectal, resp. rate 17, height 5' 4.17" (1.63 m), weight 62.6 kg (138 lb 0.1 oz), SpO2 98.00%. Physical Exam  Nursing note and vitals reviewed.  Constitutional: She appears well-developed and well-nourished. No distress.  HENT: Normocephalic and atraumatic. Blue eyes, EOM are normal. Pupils are equal, round, and reactive to light.  Neck: Normal range of motion. Neck supple.  Cardiovascular: An irregularly irregular rhythm present. Tachycardia present on admission is now controlled response. Exam reveals no friction rub. II/VI systolic murmur heard.  Pulmonary/Chest: Effort normal and breath sounds normal. No respiratory distress.   Abdominal: Soft. She exhibits no distension. There is no tenderness.   Musculoskeletal: Normal range of motion. She exhibits trace edema.  Neurological: She is alert and oriented to person, place, and time. Moves all 4 extremities Skin: Warm and dry.  Psych: No anxiety.  Assessment/Plan Chest pain r/o MI Atrial fibrillation Hypertension Hyperlipidemia UTI GERD PVD  Agree with r/o MI Nuclear stress test or cardiac cath. 2-D echo. B-blocker as tolerated.  Birdie Riddle, MD  07/16/2013, 11:45 AM

## 2013-07-16 NOTE — ED Provider Notes (Signed)
CSN: [id number]     Arrival date & time 07/16/13  0720 History   First MD Initiated Contact with Patient 07/16/13 0757     Chief Complaint  Patient presents with  . Shortness of Breath  . Dysuria     (Consider location/radiation/quality/duration/timing/severity/associated sxs/prior Treatment) Patient is a 80 y.o. female presenting with shortness of breath and dysuria. The history is provided by the patient.  Shortness of Breath Severity:  Moderate Onset quality:  Gradual Timing:  Constant Progression:  Unchanged Chronicity:  New Relieved by:  Nothing Worsened by:  Nothing tried Associated symptoms: abdominal pain (burning) and chest pain (burning)   Associated symptoms: no cough, no fever and no vomiting   Dysuria Pain quality:  Burning Pain severity:  Moderate Onset quality:  Gradual Duration:  1 day Timing:  Constant Progression:  Unchanged Chronicity:  Recurrent Recent urinary tract infections: no   Relieved by:  Nothing Associated symptoms: abdominal pain (burning)   Associated symptoms: no fever and no vomiting     Past Medical History  Diagnosis Date  . Pelvic pain in female   . Hypertension   . Hyperlipidemia   . Arthritis   . GERD (gastroesophageal reflux disease)   . Interstitial cystitis   . Urgency of urination   . Frequency of urination   . Nocturia   . Diverticulosis   . Osteopenia   . Vitamin D deficiency   . Iron deficiency   . AR (allergic rhinitis)   . History of TIA (transient ischemic attack)   . History of Helicobacter pylori infection   . History of colon polyps     ADENOMATOUS  . Bilateral carotid artery stenosis     MILD --  40% BILATERAL ICA PER DOPPLER JULY 2014  . Wears glasses   . History of shingles   . Impaired memory    Past Surgical History  Procedure Laterality Date  . Vault suspension with graft/ cystocele repair  09-17-2009  . Ureterolithotomy Bilateral 11-24-2004 LEFT  &  05-29-2006  RIGHT  . Abdominal hysterectomy     . Cysto/ hod/ instillation therapy  10-04-2008  &  05-02-2009  . Cataract extraction w/ intraocular lens  implant, bilateral  2009  . Cysto with hydrodistension  02/24/2011    Procedure: CYSTOSCOPY/HYDRODISTENSION;  Surgeon: Reece Packer, MD;  Location: [facility];  Service: Urology;  Laterality: N/A;  INSTILLATION OF marcaine and  pyridium  . Cysto/ left ureteroscopy w/ left ureteral dilation  04-15-2006  . Cysto with hydrodistension N/A 06/20/2013    Procedure: CYSTOSCOPY/HYDRODISTENSION/INSTALLATION OF PYRIDIUM -MARCAINE 0.5%.;  Surgeon: Reece Packer, MD;  Location: [place];  Service: Urology;  Laterality: N/A;   History reviewed. No pertinent family history. History  Substance Use Topics  . Smoking status: Never Smoker   . Smokeless tobacco: Never Used  . Alcohol Use: No   OB History   Grav Para Term Preterm Abortions TAB SAB Ect Mult Living                 Review of Systems  Constitutional: Negative for fever.  Respiratory: Negative for cough and shortness of breath.   Cardiovascular: Positive for chest pain (burning).  Gastrointestinal: Positive for abdominal pain (burning). Negative for vomiting.  Genitourinary: Positive for dysuria.  All other systems reviewed and are negative.     Allergies  Gelnique and Pentosan polysulfate sodium  Home Medications   Prior to Admission medications   Medication Sig Start Date End  Date Taking? Authorizing Provider  aspirin EC 81 MG tablet Take 81 mg by mouth daily.    Historical Provider, MD  cetirizine (ZYRTEC) 10 MG tablet Take 10 mg by mouth daily.    Historical Provider, MD  ciprofloxacin (CIPRO) 250 MG tablet Take 1 tablet (250 mg total) by mouth 2 (two) times daily. 06/20/13   Reece Packer, MD  cyanocobalamin 500 MCG tablet Take 500 mcg by mouth. TAKES BUT NOT EVERY DAY    Historical Provider, MD  HYDROcodone-acetaminophen (NORCO) 5-325 MG per tablet Take 1 tablet by mouth  every 6 (six) hours as needed for moderate pain. 06/20/13   Reece Packer, MD  LORazepam (ATIVAN) 0.5 MG tablet Take 0.5 mg by mouth every 8 (eight) hours.    Historical Provider, MD  losartan (COZAAR) 50 MG tablet Take 50 mg by mouth daily.    Historical Provider, MD  pantoprazole sodium (PROTONIX) 40 mg/20 mL PACK Place 40 mg into feeding tube daily.    Historical Provider, MD  phenazopyridine (PYRIDIUM) 100 MG tablet Take 100 mg by mouth 3 (three) times daily as needed. bladder    Historical Provider, MD  polyethylene glycol (MIRALAX / GLYCOLAX) packet Take 17 g by mouth daily.     Historical Provider, MD  pregabalin (LYRICA) 50 MG capsule Take 50 mg by mouth daily.    Historical Provider, MD  solifenacin (VESICARE) 5 MG tablet Take 10 mg by mouth as needed. For bladder    Historical Provider, MD  Tapentadol HCl (NUCYNTA PO) Take 40 mg by mouth 1 day or 1 dose.    Historical Provider, MD  traMADol (ULTRAM) 50 MG tablet Take 50 mg by mouth every 6 (six) hours as needed for pain.    Historical Provider, MD  zolpidem (AMBIEN) 5 MG tablet Take 5 mg by mouth at bedtime as needed for sleep.    Historical Provider, MD   BP 121/78  Pulse 113  Temp(Src) 98 F (36.7 C) (Oral)  Resp 16  SpO2 96% Physical Exam  Nursing note and vitals reviewed. Constitutional: She is oriented to person, place, and time. She appears well-developed and well-nourished. No distress.  HENT:  Head: Normocephalic and atraumatic.  Eyes: EOM are normal. Pupils are equal, round, and reactive to light.  Neck: Normal range of motion. Neck supple.  Cardiovascular: An irregularly irregular rhythm present. Tachycardia present.  Exam reveals no friction rub.   No murmur heard. Pulmonary/Chest: Effort normal and breath sounds normal. No respiratory distress. She has no wheezes. She has no rales.  Abdominal: Soft. She exhibits no distension. There is no tenderness. There is no rebound.  Musculoskeletal: Normal range of motion.  She exhibits no edema.  Neurological: She is alert and oriented to person, place, and time.  Skin: She is not diaphoretic.    ED Course  Procedures (including critical care time) Labs Review Labs Reviewed  CULTURE, BLOOD (ROUTINE X 2)  CULTURE, BLOOD (ROUTINE X 2)  URINE CULTURE  CBC WITH DIFFERENTIAL  COMPREHENSIVE METABOLIC PANEL  URINALYSIS, ROUTINE W REFLEX MICROSCOPIC  I-STAT CG4 LACTIC ACID, ED    Imaging Review No results found.   EKG Interpretation   Date/Time:  Sunday July 16 2013 07:44:41 EDT Ventricular Rate:  148 PR Interval:    QRS Duration: 136 QT Interval:  351 QTC Calculation: 551 R Axis:   -35 Text Interpretation:  Atrial fibrillation with RVR, new Nonspecific  intraventricular conduction delay Confirmed by Mingo Amber  MD, Lorely Bubb ([id number]) on  07/16/2013  8:05:47 AM     CRITICAL CARE Performed by: Osvaldo Shipper   Total critical care time: 30 minutes  Critical care time was exclusive of separately billable procedures and treating other patients.  Critical care was necessary to treat or prevent imminent or life-threatening deterioration.  Critical care was time spent personally by me on the following activities: development of treatment plan with patient and/or surrogate as well as nursing, discussions with consultants, evaluation of patient's response to treatment, examination of patient, obtaining history from patient or surrogate, ordering and performing treatments and interventions, ordering and review of laboratory studies, ordering and review of radiographic studies, pulse oximetry and re-evaluation of patient's condition.  MDM   Final diagnoses:  UTI (lower urinary tract infection)  Other chest pain  PAF (paroxysmal atrial fibrillation)    56F presents with dysuria, abdominal buring, chest buring. Recent hydrodistention of the bladder for interstitial cystitis with cystoscopy.  Burning in urination and in her abdomen. No abdominal pain on  exam. Lungs clear. Afib on the monitor with RVR - rates in the 110s - 150s. Normotensive here, orally afebrile. No hx of afib. Concern for possible urosepsis with her dysuria and accompanying tachycardia. Will order labs, give fluids.  After fluids, tachycardia resolving, no need for dilt. UA shows UTI, admitted.   Osvaldo Shipper, MD 07/16/13 1536

## 2013-07-16 NOTE — ED Notes (Signed)
Pt c/o dysuria and unable to void when trying. Will notify MD.

## 2013-07-16 NOTE — Progress Notes (Signed)
ANTICOAGULATION CONSULT NOTE - Follow Up Consult  Pharmacy Consult for Heparin Indication: atrial fibrillation  Allergies  Allergen Reactions  . Gabapentin Other (See Comments)    UNKNOWN  . Gelnique [Oxybutynin] Other (See Comments)    UNKNOWN  . Pentosan Polysulfate Sodium Other (See Comments)    BLURRED VISION/ HEADACHE    Patient Measurements: Height: 5\' 4"  (162.6 cm) Weight: 141 lb 1.5 oz (64 kg) IBW/kg (Calculated) : 54.7 Heparin Dosing Weight: 62.6 kg  Vital Signs: Temp: 98.7 F (37.1 C) (07/12 2041) Temp src: Oral (07/12 2041) BP: 146/59 mmHg (07/12 2041) Pulse Rate: 64 (07/12 2041)  Labs:  Recent Labs  07/16/13 0807 07/16/13 1147 07/16/13 1317 07/16/13 2045  HGB 14.1  --   --   --   HCT 41.6  --   --   --   PLT 181  --   --   --   APTT  --  30  --   --   LABPROT  --  13.2  --   --   INR  --  1.00  --   --   HEPARINUNFRC  --   --   --  0.60  CREATININE 0.95  --   --   --   TROPONINI  --   --  <0.30  --     Estimated Creatinine Clearance: 42.1 ml/min (by C-G formula based on Cr of 0.95).   Medications:  Scheduled:  . aspirin EC  81 mg Oral Daily  . [START ON 07/17/2013] cefTRIAXone (ROCEPHIN)  IV  2 g Intravenous Q24H  . [START ON 07/17/2013] darifenacin  7.5 mg Oral Daily  . metoprolol tartrate  12.5 mg Oral BID  . [START ON 07/17/2013] pantoprazole  40 mg Oral Daily  . phenazopyridine  100 mg Oral TID WC  . polyethylene glycol  17 g Oral Daily  . [START ON 07/17/2013] pregabalin  50 mg Oral Daily  . sodium chloride  3 mL Intravenous Q12H   Infusions:  . sodium chloride 1,000 mL (07/16/13 1123)  . heparin 1,100 Units/hr (07/16/13 1427)   PRN: acetaminophen, acetaminophen, LORazepam, ondansetron (ZOFRAN) IV, ondansetron, sodium chloride, traMADol, zolpidem  Assessment: 80 y.o. female with PMH of HTN, HPL, GERD, h/o TIA presented with int remittent substernal burning pains overnight, associated with diaphoresis, SHOB, palpitations. ECG showed  afib which is new, and hypotension.  Heparin level =0.6 when drawn at 2045.  Goal of Therapy:  Heparin level 0.3-0.7 units/ml Monitor platelets by anticoagulation protocol: Yes   Plan:  1.  Heparin infusing at 1100 Units/hour.   2.  Continue at present rate. 3.  Continue to monitor H&H and platelets 4.  Follow up heparin level in am.  Gypsy Decant 07/16/2013,9:30 PM

## 2013-07-16 NOTE — H&P (Signed)
[facility] History and Physical  Christine [name] VOH:[id number] DOB: [birth year] DOA: 07/16/2013  Referring physician:  PCP: Ezequiel Kayser, MD  Specialists:   Chief Complaint: chest pain   HPI: Christine [name] is a 80 y.o. female with PMH of HTN, HPL, GERD, h/o TIA presented with int remittent substernal burning pains overnight, associated with diaphoresis, SOB, palpitations; Pt reports having similar sympoms in the past related to GERD; Pt denies exertional symptoms at baseline; She also reports chills, dysuria and found to have probable UTI in ED; no focal neuro symptoms, no n/v/d  -also ECG shoed PAF/new, with hypotension    Review of Systems: The patient denies anorexia, fever, weight loss,, vision loss, decreased hearing, hoarseness,  syncope, dyspnea on exertion, peripheral edema, balance deficits, hemoptysis, abdominal pain, melena, hematochezia, severe indigestion/heartburn, hematuria, incontinence, genital sores, muscle weakness, suspicious skin lesions, transient blindness, difficulty walking, depression, unusual weight change, abnormal bleeding, enlarged lymph nodes, angioedema, and breast masses.    Past Medical History  Diagnosis Date  . Pelvic pain in female   . Hypertension   . Hyperlipidemia   . Arthritis   . GERD (gastroesophageal reflux disease)   . Interstitial cystitis   . Urgency of urination   . Frequency of urination   . Nocturia   . Diverticulosis   . Osteopenia   . Vitamin D deficiency   . Iron deficiency   . AR (allergic rhinitis)   . History of TIA (transient ischemic attack)   . History of Helicobacter pylori infection   . History of colon polyps     ADENOMATOUS  . Bilateral carotid artery stenosis     MILD --  40% BILATERAL ICA PER DOPPLER JULY 2014  . Wears glasses   . History of shingles   . Impaired memory    Past Surgical History  Procedure Laterality Date  . Vault suspension with graft/ cystocele repair  09-17-2009  .  Ureterolithotomy Bilateral 11-24-2004 LEFT  &  05-29-2006  RIGHT  . Abdominal hysterectomy    . Cysto/ hod/ instillation therapy  10-04-2008  &  05-02-2009  . Cataract extraction w/ intraocular lens  implant, bilateral  2009  . Cysto with hydrodistension  02/24/2011    Procedure: CYSTOSCOPY/HYDRODISTENSION;  Surgeon: Martina Sinner, MD;  Location: [facility];  Service: Urology;  Laterality: N/A;  INSTILLATION OF marcaine and  pyridium  . Cysto/ left ureteroscopy w/ left ureteral dilation  04-15-2006  . Cysto with hydrodistension N/A 06/20/2013    Procedure: CYSTOSCOPY/HYDRODISTENSION/INSTALLATION OF PYRIDIUM -MARCAINE 0.5%.;  Surgeon: Martina Sinner, MD;  Location: [facility] [place];  Service: Urology;  Laterality: N/A;   Social History:  reports that she has never smoked. She has never used smokeless tobacco. She reports that she does not drink alcohol or use illicit drugs. Home;  where does patient live--home, ALF, SNF? and with whom if at home? Yes;  Can patient participate in ADLs?  Allergies  Allergen Reactions  . Gabapentin Other (See Comments)    UNKNOWN  . Gelnique [Oxybutynin] Other (See Comments)    UNKNOWN  . Pentosan Polysulfate Sodium Other (See Comments)    BLURRED VISION/ HEADACHE    History reviewed. No pertinent family history.  (be sure to complete)  Prior to Admission medications   Medication Sig Start Date End Date Taking? Authorizing Provider  aspirin EC 81 MG tablet Take 81 mg by mouth daily.   Yes Historical Provider, MD  LORazepam (ATIVAN) 0.5 MG tablet Take 0.5  mg by mouth every 8 (eight) hours as needed for anxiety.    Yes Historical Provider, MD  losartan (COZAAR) 50 MG tablet Take 25 mg by mouth every morning.    Yes Historical Provider, MD  pantoprazole (PROTONIX) 40 MG tablet Take 40 mg by mouth daily.   Yes Historical Provider, MD  phenazopyridine (PYRIDIUM) 100 MG tablet Take 100 mg by mouth 3 (three) times daily as  needed. bladder   Yes Historical Provider, MD  polyethylene glycol (MIRALAX / GLYCOLAX) packet Take 17 g by mouth daily.    Yes Historical Provider, MD  pregabalin (LYRICA) 50 MG capsule Take 50 mg by mouth daily.   Yes Historical Provider, MD  solifenacin (VESICARE) 10 MG tablet Take 10 mg by mouth daily.   Yes Historical Provider, MD  Tapentadol HCl (NUCYNTA) 100 MG TABS Take 100 mg by mouth daily.   Yes Historical Provider, MD  traMADol (ULTRAM) 50 MG tablet Take 50 mg by mouth every 6 (six) hours as needed for pain.   Yes Historical Provider, MD  zolpidem (AMBIEN) 5 MG tablet Take 5 mg by mouth at bedtime as needed for sleep.   Yes Historical Provider, MD   Physical Exam: Filed Vitals:   07/16/13 1015  BP: 126/43  Pulse: 68  Temp:   Resp: 15     General:  alert  Eyes: EOM-I  ENT: no oral ulcers   Neck: supple, no JVD  Cardiovascular: s1,s2 rrr  Respiratory: CTA BL  Abdomen: soft, nt,nd   Skin: no ras   Musculoskeletal: no LE edema  Psychiatric: no hallucinations   Neurologic: CN 2-12 intact, motor 5/5 BL  Labs on Admission:  Basic Metabolic Panel:  Recent Labs Lab 07/16/13 0807  NA 142  K 3.9  CL 102  CO2 23  GLUCOSE 123*  BUN 14  CREATININE 0.95  CALCIUM 10.0   Liver Function Tests:  Recent Labs Lab 07/16/13 0807  AST 17  ALT 8  ALKPHOS 64  BILITOT 0.7  PROT 7.7  ALBUMIN 4.2   No results found for this basename: LIPASE, AMYLASE,  in the last 168 hours No results found for this basename: AMMONIA,  in the last 168 hours CBC:  Recent Labs Lab 07/16/13 0807  WBC 6.4  NEUTROABS 4.9  HGB 14.1  HCT 41.6  MCV 91.2  PLT 181   Cardiac Enzymes: No results found for this basename: CKTOTAL, CKMB, CKMBINDEX, TROPONINI,  in the last 168 hours  BNP (last 3 results) No results found for this basename: PROBNP,  in the last 8760 hours CBG: No results found for this basename: GLUCAP,  in the last 168 hours  Radiological Exams on Admission: Dg  Chest Port 1 View  07/16/2013   CLINICAL DATA:  Shortness of breath.  Chest pain.  EXAM: PORTABLE CHEST - 1 VIEW  COMPARISON:  Chest x-ray 09/30/ 2010.  FINDINGS: Mediastinum and hilar structures normal. Lungs are clear. Heart size normal. No pleural effusion or pneumothorax .  IMPRESSION: No active disease.   Electronically Signed   By: Maisie Fus  Register   On: 07/16/2013 08:30    EKG: Independently reviewed.   Assessment/Plan Principal Problem:   UTI (lower urinary tract infection) Active Problems:   PAF (paroxysmal atrial fibrillation)   80 y.o. female with PMH of HTN, HPL, GERD, h/o TIA presented with int remittent substernal burning pains overnight, associated with diaphoresis, SOB, palpitations -found to have PAF, UTI, hypotension    1. PAF-new; rhythm is back to  sinus;  -check serial trop, ECG, obtain echo;  -start IV heparin; needs AC-high chads:h/o TIA; start low dose BB; consult cardiology  2. Chest pain, atypical; with new PAF; -obtain enzymes, monitor ECG; cont ASA, statin, added BB; pend echo, trop;  consult cardiology stress test eval  3. Probable UTI; afebrile; no leukocytosis; started IV atx; f/u cultures; IVF;  4. HTN on ARB; hold ABR due ot hypotension; monitor  5. H/o TIA no new neuro symptoms; cont monitor  6. GERD, cont PPI; EGD: (2014) normal   Cardiology;  if consultant consulted, please document name and whether formally or informally consulted  Code Status: full (must indicate code status--if unknown or must be presumed, indicate so) Family Communication:  D/w patient, her daughter  (indicate person spoken with, if applicable, with phone number if by telephone) Disposition Plan: home 24-48 hrs  (indicate anticipated LOS)  Time spent: >35 minutes   Esperanza Sheets [facility] Pager [phone]  If 7PM-7AM, please contact night-coverage [email or web address] Password [email or web address] 07/16/2013, 11:01 AM

## 2013-07-16 NOTE — ED Notes (Signed)
rn and tech tried to take pt immediately into triage room. Pt reported she had to urinate and wanted to go to the bathroom first. Pt and daughter in bathroom for extended time.

## 2013-07-17 DIAGNOSIS — N390 Urinary tract infection, site not specified: Secondary | ICD-10-CM | POA: Diagnosis not present

## 2013-07-17 DIAGNOSIS — I059 Rheumatic mitral valve disease, unspecified: Secondary | ICD-10-CM | POA: Diagnosis not present

## 2013-07-17 DIAGNOSIS — K219 Gastro-esophageal reflux disease without esophagitis: Secondary | ICD-10-CM | POA: Diagnosis not present

## 2013-07-17 DIAGNOSIS — R079 Chest pain, unspecified: Secondary | ICD-10-CM | POA: Diagnosis not present

## 2013-07-17 DIAGNOSIS — I5041 Acute combined systolic (congestive) and diastolic (congestive) heart failure: Secondary | ICD-10-CM | POA: Diagnosis not present

## 2013-07-17 DIAGNOSIS — I4891 Unspecified atrial fibrillation: Secondary | ICD-10-CM | POA: Diagnosis not present

## 2013-07-17 HISTORY — PX: TRANSTHORACIC ECHOCARDIOGRAM: SHX275

## 2013-07-17 LAB — CBC
HEMATOCRIT: 31.7 % — AB (ref 36.0–46.0)
HEMOGLOBIN: 10.5 g/dL — AB (ref 12.0–15.0)
MCH: 30.6 pg (ref 26.0–34.0)
MCHC: 33.1 g/dL (ref 30.0–36.0)
MCV: 92.4 fL (ref 78.0–100.0)
Platelets: 149 10*3/uL — ABNORMAL LOW (ref 150–400)
RBC: 3.43 MIL/uL — ABNORMAL LOW (ref 3.87–5.11)
RDW: 13.8 % (ref 11.5–15.5)
WBC: 4.4 10*3/uL (ref 4.0–10.5)

## 2013-07-17 LAB — URINE CULTURE
COLONY COUNT: NO GROWTH
CULTURE: NO GROWTH

## 2013-07-17 LAB — HEPARIN LEVEL (UNFRACTIONATED): Heparin Unfractionated: 0.82 IU/mL — ABNORMAL HIGH (ref 0.30–0.70)

## 2013-07-17 LAB — TROPONIN I: Troponin I: <0.3 ng/mL (ref <0.30)

## 2013-07-17 MED ORDER — RIVAROXABAN (XARELTO) VTE STARTER PACK (15 & 20 MG): ORAL_TABLET | ORAL | Status: DC

## 2013-07-17 MED ORDER — FUROSEMIDE 10 MG/ML IJ SOLN
40.0000 mg | Freq: Once | INTRAMUSCULAR | Status: AC
  Administered 2013-07-17: 40 mg via INTRAVENOUS
  Filled 2013-07-17: qty 4

## 2013-07-17 MED ORDER — LEVOFLOXACIN 500 MG PO TABS: 500.0000 mg | ORAL_TABLET | Freq: Every day | ORAL | Status: DC

## 2013-07-17 MED ORDER — POTASSIUM CHLORIDE CRYS ER 20 MEQ PO TBCR
40.0000 meq | EXTENDED_RELEASE_TABLET | Freq: Once | ORAL | Status: AC
  Administered 2013-07-17: 40 meq via ORAL
  Filled 2013-07-17: qty 2

## 2013-07-17 MED ORDER — RIVAROXABAN 15 MG PO TABS
15.0000 mg | ORAL_TABLET | Freq: Every day | ORAL | Status: DC
  Administered 2013-07-17: 15 mg via ORAL
  Filled 2013-07-17: qty 1

## 2013-07-17 MED ORDER — RIVAROXABAN 15 MG PO TABS: 15.0000 mg | ORAL_TABLET | Freq: Every day | ORAL | Status: AC

## 2013-07-17 MED ORDER — HEPARIN (PORCINE) IN NACL 100-0.45 UNIT/ML-% IJ SOLN
950.0000 [IU]/h | INTRAMUSCULAR | Status: DC
  Administered 2013-07-17: 950 [IU]/h via INTRAVENOUS
  Filled 2013-07-17: qty 250

## 2013-07-17 NOTE — Progress Notes (Addendum)
ANTICOAGULATION CONSULT NOTE - Initial Consult  Pharmacy Consult for Heparin --> Xarelto Indication: atrial fibrillation  Allergies  Allergen Reactions  . Gabapentin Other (See Comments)    UNKNOWN  . Gelnique [Oxybutynin] Other (See Comments)    UNKNOWN  . Pentosan Polysulfate Sodium Other (See Comments)    BLURRED VISION/ HEADACHE    Patient Measurements: Height: 5\' 4"  (162.6 cm) Weight: 141 lb 1.5 oz (64 kg) IBW/kg (Calculated) : 54.7 Heparin Dosing Weight: 62.6 kg  Vital Signs: Temp: 97.2 F (36.2 C) (07/13 1039) Temp src: Oral (07/13 1039) BP: 147/59 mmHg (07/13 1039) Pulse Rate: 60 (07/13 1039)  Labs:  Recent Labs  07/16/13 0807 07/16/13 1147 07/16/13 1317 07/16/13 1945 07/16/13 2045 07/17/13 0025 07/17/13 0443  HGB 14.1  --   --   --   --   --  10.5*  HCT 41.6  --   --   --   --   --  31.7*  PLT 181  --   --   --   --   --  149*  APTT  --  30  --   --   --   --   --   LABPROT  --  13.2  --   --   --   --   --   INR  --  1.00  --   --   --   --   --   HEPARINUNFRC  --   --   --   --  0.60  --  0.82*  CREATININE 0.95  --   --   --   --   --   --   TROPONINI  --   --  <0.30 <0.30  --  <0.30  --     Estimated Creatinine Clearance: 42.1 ml/min (by C-G formula based on Cr of 0.95).   Medical History: Past Medical History  Diagnosis Date  . Pelvic pain in female   . Hypertension   . Hyperlipidemia   . Arthritis   . GERD (gastroesophageal reflux disease)   . Interstitial cystitis   . Urgency of urination   . Frequency of urination   . Nocturia   . Diverticulosis   . Osteopenia   . Vitamin D deficiency   . Iron deficiency   . AR (allergic rhinitis)   . History of TIA (transient ischemic attack)   . History of Helicobacter pylori infection   . History of colon polyps     ADENOMATOUS  . Bilateral carotid artery stenosis     MILD --  40% BILATERAL ICA PER DOPPLER JULY 2014  . Wears glasses   . History of shingles   . Impaired memory      Medications:  Scheduled:  . aspirin EC  81 mg Oral Daily  . cefTRIAXone (ROCEPHIN)  IV  2 g Intravenous Q24H  . darifenacin  7.5 mg Oral Daily  . metoprolol tartrate  12.5 mg Oral BID  . pantoprazole  40 mg Oral Daily  . phenazopyridine  100 mg Oral TID WC  . polyethylene glycol  17 g Oral Daily  . pregabalin  50 mg Oral Daily  . sodium chloride  3 mL Intravenous Q12H   Infusions:  . sodium chloride 1,000 mL (07/17/13 0239)  . heparin 950 Units/hr (07/17/13 3845)   PRN:   Assessment: 80 y.o. female with PMH of HTN, HPL, GERD, h/o TIA presented with int remittent substernal burning pains, associated with diaphoresis, SHOB, palpitations.  ECG showed afib which is new, and hypotension. Pharmacy consulted to dose IV heparin.  Now transitioning to Xarelto in preparation of discharge.  Heparin infusion running at 950 units/hr.  Rate reduced this AM for supratherapeutic level of 0.82.  Hgb low, platelets slightly low  SCr 0.95, CrCl~49 ml/min  Goal of Therapy:  Prevention of stroke Monitor platelets by anticoagulation protocol: Yes   Plan:  1.  Start Xarelto 15 mg PO daily today for CrCl 15-50 mL/min for non-valvular afib.  Heparin drip stopped at ~1250 so will start first dose of Xarelto at 1300.  This has been communicated to the RN.  Patient's CrCl is at the cut-off for Xarelto dose reduction, chose to reduced dose to 15 mg daily given age and potential for slight SCr increase from dehydration after discharge.  Would recommend follow SCr as outpatient for potential dose adjustment since only able to evaluate one SCr level during this admission.   2.  Patient was on aspirin 81 mg daily PTA and this has been discontinued now that patient is starting Xarelto. 3.  Will provide Xarelto education.  Hershal Coria, PharmD, BCPS Pager: [phone] 07/17/2013 12:39 PM    ADDENDUM: 07/17/2013 1:17 PM Educated patient on Xarelto use and safety.  Patient was concerned about medication  and did not want to take her first Xarelto dose until her daughter approved.  Was able to reach daughter over phone and provided Xarelto education to her as well.  Hershal Coria, PharmD, BCPS Pager: [phone] 07/17/2013 1:18 PM

## 2013-07-17 NOTE — Discharge Instructions (Addendum)
Please follow up with primary care doctor in 1 week Please follow up with cardiology 2-3 weeks     Information on my medicine - XARELTO (Rivaroxaban)  This medication education was reviewed with me or my healthcare representative as part of my discharge preparation.  The pharmacist that spoke with me during my hospital stay was:  Hershal Coria, [facility]  Why was Xarelto prescribed for you? Xarelto was prescribed for you to reduce the risk of a blood clot forming that can cause a stroke if you have a medical condition called atrial fibrillation (a type of irregular heartbeat).  What do you need to know about xarelto ? Take your Xarelto ONCE DAILY at the same time every day with your evening meal. If you have difficulty swallowing the tablet whole, you may crush it and mix in applesauce just prior to taking your dose.  Take Xarelto exactly as prescribed by your doctor and DO NOT stop taking Xarelto without talking to the doctor who prescribed the medication.  Stopping without other stroke prevention medication to take the place of Xarelto may increase your risk of developing a clot that causes a stroke.  Refill your prescription before you run out.  After discharge, you should have regular check-up appointments with your healthcare provider that is prescribing your Xarelto.  In the future your dose may need to be changed if your kidney function or weight changes by a significant amount.  What do you do if you miss a dose? If you are taking Xarelto ONCE DAILY and you miss a dose, take it as soon as you remember on the same day then continue your regularly scheduled once daily regimen the next day. Do not take two doses of Xarelto at the same time or on the same day.   Important Safety Information A possible side effect of Xarelto is bleeding. You should call your healthcare provider right away if you experience any of the following:   Bleeding from an injury or your nose that does not  stop.   Unusual colored urine (red or dark brown) or unusual colored stools (red or black).   Unusual bruising for unknown reasons.   A serious fall or if you hit your head (even if there is no bleeding).  Some medicines may interact with Xarelto and might increase your risk of bleeding while on Xarelto. To help avoid this, consult your healthcare provider or pharmacist prior to using any new prescription or non-prescription medications, including herbals, vitamins, non-steroidal anti-inflammatory drugs (NSAIDs) and supplements.  This website has more information on Xarelto: [email or web address]

## 2013-07-17 NOTE — Discharge Summary (Signed)
Physician Discharge Summary  FE ESSENBURG DGU:[id number] DOB: [birth year] DOA: 07/16/2013  PCP: Ezequiel Kayser, MD  Admit date: 07/16/2013 Discharge date: 07/17/2013  Time spent: >35 minutes  Recommendations for Outpatient Follow-up:  F/u with PCP in 1 week  F/u with cardiologist in 2-3 week s Discharge Diagnoses:  Principal Problem:   UTI (lower urinary tract infection) Active Problems:   PAF (paroxysmal atrial fibrillation)   Chest pain   Discharge Condition: stable   Diet recommendation: heart healthy   Filed Weights   07/16/13 0807 07/16/13 1244  Weight: 62.6 kg (138 lb 0.1 oz) 64 kg (141 lb 1.5 oz)    History of present illness:  Principal Problem:  UTI (lower urinary tract infection)  Active Problems:  PAF (paroxysmal atrial fibrillation)   80 y.o. female with PMH of HTN, HPL, GERD, h/o TIA presented with int remittent substernal burning pains overnight, associated with diaphoresis, SOB, palpitations  -found to have PAF, UTI, hypotension   Hospital Course:  1. PAF-new; rhythm is back to sinus;  -neg serial trop; echo: LVEF 60-65 %, LVH, mild AR, MR; there were no regional wall motion abnormalities -Initially start on IV heparin; transition to xarelto due to high chads:h/o TIA;  2. Chest pain, atypical; with new PAF;  -enzymes neg, cont statin,; echo as above; consult cardiology stress test eval, but patient refused further evaluation  -Per cardiology mild fluid overload given Iv lasix x 1; no significant CHF symptoms; cont outpatient follow up next week for evaluation for diuretic therapy if indicated  3. Probable UTI; afebrile; no leukocytosis; started IV atx; blood cultures NGTD;  -pend urine cultures; Patient demanding to go home pend cultures; changed to PO atx, f/u with PCP this week with cultures  4. HTN on ARB;  5. H/o TIA no new neuro symptoms; but new PAF; started AC  6. GERD, cont PPI; EGD: (2014) normal   Cardiology; if consultant consulted, please  document name and whether formally or informally consulted   Procedures: Echo Study Conclusions  - Left ventricle: The cavity size was normal. There was mild concentric hypertrophy. Systolic function was normal. The estimated ejection fraction was in the range of 60% to 65%. Wall motion was normal; there were no regional wall motion abnormalities. Doppler parameters are consistent with abnormal left ventricular relaxation (grade 1 diastolic dysfunction). - Aortic valve: There was mild regurgitation. - Mitral valve: Calcified annulus. There was mild regurgitation.    (i.e. Studies not automatically included, echos, thoracentesis, etc; not x-rays)  Consultations:  Cardiology   Discharge Exam: Filed Vitals:   07/17/13 1039  BP: 147/59  Pulse: 60  Temp: 97.2 F (36.2 C)  Resp: 20    General: alert Cardiovascular: s1,s2 rrr Respiratory: CTA BL  Discharge Instructions  Discharge Instructions   Diet - low sodium heart healthy    Complete by:  As directed      Discharge instructions    Complete by:  As directed   Please follow up with cardiologist in 2-3 week s Please follow up with Primary care doctor in 1 week     Increase activity slowly    Complete by:  As directed             Medication List    STOP taking these medications       aspirin EC 81 MG tablet      TAKE these medications       levofloxacin 500 MG tablet  Commonly known as:  LEVAQUIN  Take  1 tablet (500 mg total) by mouth daily.     LORazepam 0.5 MG tablet  Commonly known as:  ATIVAN  Take 0.5 mg by mouth every 8 (eight) hours as needed for anxiety.     losartan 50 MG tablet  Commonly known as:  COZAAR  Take 25 mg by mouth every morning.     LYRICA 50 MG capsule  Generic drug:  pregabalin  Take 50 mg by mouth daily.     NUCYNTA 100 MG Tabs  Generic drug:  Tapentadol HCl  Take 100 mg by mouth daily.     pantoprazole 40 MG tablet  Commonly known as:  PROTONIX  Take 40 mg by mouth  daily.     phenazopyridine 100 MG tablet  Commonly known as:  PYRIDIUM  Take 100 mg by mouth 3 (three) times daily as needed. bladder     polyethylene glycol packet  Commonly known as:  MIRALAX / GLYCOLAX  Take 17 g by mouth daily.     Rivaroxaban 15 MG Tabs tablet  Commonly known as:  XARELTO  Take 1 tablet (15 mg total) by mouth daily with supper.     solifenacin 10 MG tablet  Commonly known as:  VESICARE  Take 10 mg by mouth daily.     traMADol 50 MG tablet  Commonly known as:  ULTRAM  Take 50 mg by mouth every 6 (six) hours as needed for pain.     zolpidem 5 MG tablet  Commonly known as:  AMBIEN  Take 5 mg by mouth at bedtime as needed for sleep.       Allergies  Allergen Reactions  . Gabapentin Other (See Comments)    UNKNOWN  . Gelnique [Oxybutynin] Other (See Comments)    UNKNOWN  . Pentosan Polysulfate Sodium Other (See Comments)    BLURRED VISION/ HEADACHE       Follow-up Information   Follow up with [facility] S, MD. Schedule an appointment as soon as possible for a visit in 2 weeks.   Specialty:  Cardiology   Contact information:   [address] [place] [address] [phone]       Follow up with Rodrigo Ran A, MD In 1 week.   Specialty:  Internal Medicine   Contact information:   [address] [place] [address] [phone]        The results of significant diagnostics from this hospitalization (including imaging, microbiology, ancillary and laboratory) are listed below for reference.    Significant Diagnostic Studies: Dg Chest Port 1 View  07/16/2013   CLINICAL DATA:  Shortness of breath.  Chest pain.  EXAM: PORTABLE CHEST - 1 VIEW  COMPARISON:  Chest x-ray 09/30/ 2010.  FINDINGS: Mediastinum and hilar structures normal. Lungs are clear. Heart size normal. No pleural effusion or pneumothorax .  IMPRESSION: No active disease.   Electronically Signed   By: Maisie Fus  Register   On: 07/16/2013 08:30    Microbiology: Recent  Results (from the past 240 hour(s))  CULTURE, BLOOD (ROUTINE X 2)     Status: None   Collection Time    07/16/13  8:39 AM      Result Value Ref Range Status   Specimen Description BLOOD RIGHT ANTECUBITAL   Final   Special Requests BOTTLES DRAWN AEROBIC ONLY 2CC   Final   Culture  Setup Time     Final   Value: 07/16/2013 15:29     Performed at [facility]  Final   Value:        BLOOD CULTURE RECEIVED NO GROWTH TO DATE CULTURE WILL BE HELD FOR 5 DAYS BEFORE ISSUING A FINAL NEGATIVE REPORT     Performed at [facility]   Report Status PENDING   Incomplete  CULTURE, BLOOD (ROUTINE X 2)     Status: None   Collection Time    07/16/13  8:39 AM      Result Value Ref Range Status   Specimen Description BLOOD RIGHT HAND   Final   Special Requests BOTTLES DRAWN AEROBIC AND ANAEROBIC 5CC   Final   Culture  Setup Time     Final   Value: 07/16/2013 15:29     Performed at [facility]   Culture     Final   Value:        BLOOD CULTURE RECEIVED NO GROWTH TO DATE CULTURE WILL BE HELD FOR 5 DAYS BEFORE ISSUING A FINAL NEGATIVE REPORT     Performed at [facility]   Report Status PENDING   Incomplete     Labs: Basic Metabolic Panel:  Recent Labs Lab 07/16/13 0807  NA 142  K 3.9  CL 102  CO2 23  GLUCOSE 123*  BUN 14  CREATININE 0.95  CALCIUM 10.0   Liver Function Tests:  Recent Labs Lab 07/16/13 0807  AST 17  ALT 8  ALKPHOS 64  BILITOT 0.7  PROT 7.7  ALBUMIN 4.2   No results found for this basename: LIPASE, AMYLASE,  in the last 168 hours No results found for this basename: AMMONIA,  in the last 168 hours CBC:  Recent Labs Lab 07/16/13 0807 07/17/13 0443  WBC 6.4 4.4  NEUTROABS 4.9  --   HGB 14.1 10.5*  HCT 41.6 31.7*  MCV 91.2 92.4  PLT 181 149*   Cardiac Enzymes:  Recent Labs Lab 07/16/13 1317 07/16/13 1945 07/17/13 0025  TROPONINI <0.30 <0.30 <0.30   BNP: BNP (last 3 results) No results found for this  basename: PROBNP,  in the last 8760 hours CBG: No results found for this basename: GLUCAP,  in the last 168 hours     Signed:  Esperanza Sheets  [facility] 07/17/2013, 12:44 PM

## 2013-07-17 NOTE — Progress Notes (Signed)
Patient discharged home with daughter, discharge instructions given and explained to patient/daughter and they verbalized understanding. Denies any pain/distress. Skin intact, no wound noted. Accompanied home by daughter, transported to the car by staff via wheelchair.

## 2013-07-17 NOTE — Progress Notes (Signed)
*  PRELIMINARY RESULTS* Echocardiogram 2D Echocardiogram has been performed.  Leavy Cella 07/17/2013, 10:33 AM

## 2013-07-17 NOTE — Consult Note (Signed)
Ref: Christine Ly, MD   Subjective:  Feeling better. No chest pain. Cardiac enzymes are normal x 3. Echocardiogram shows mil LVH and mild MR, TR, AI and PI. Patient and daughter prefer medical treatment only for now till the discuss with Christine [name].  Objective:  Vital Signs in the last 24 hours: Temp:  [97.2 F (36.2 C)-98.7 F (37.1 C)] 97.2 F (36.2 C) (07/13 1039) Pulse Rate:  [59-71] 60 (07/13 1039) Cardiac Rhythm:  [-] Normal sinus rhythm (07/13 1156) Resp:  [15-20] 20 (07/13 1039) BP: (130-154)/(53-61) 147/59 mmHg (07/13 1039) SpO2:  [94 %-100 %] 97 % (07/13 1039) Weight:  [64 kg (141 lb 1.5 oz)] 64 kg (141 lb 1.5 oz) (07/12 1244)  Physical Exam: BP Readings from Last 1 Encounters:  07/17/13 147/59    Wt Readings from Last 1 Encounters:  07/16/13 64 kg (141 lb 1.5 oz)    Weight change:   HEENT: /AT, Eyes-Blue, PERL, EOMI, Conjunctiva-Pale pink, Sclera-Non-icteric Neck: No JVD, No bruit, Trachea midline. Lungs:  Clear, Bilateral. Cardiac:  Irregular rhythm, normal S1 and S2, no S3. II/VI systolic murmur and diastolic murmur. Abdomen:  Soft, non-tender. Extremities:  Trace edema present. No cyanosis. No clubbing. CNS: AxOx3, Cranial nerves grossly intact, moves all 4 extremities. Right handed. Skin: Warm and dry.   Intake/Output from previous day: 07/12 0701 - 07/13 0700 In: 3042.1 [P.O.:480; I.V.:2562.1] Out: 1850 [Urine:1850]    Lab Results: BMET    Component Value Date/Time   NA 142 07/16/2013 0807   NA 140 06/20/2013 1016   NA 136 07/07/2011 1405   K 3.9 07/16/2013 0807   K 3.9 06/20/2013 1016   K 3.5 07/07/2011 1405   CL 102 07/16/2013 0807   CL 101 07/07/2011 1405   CL 106 09/18/2009 0430   CO2 23 07/16/2013 0807   CO2 26 07/07/2011 1405   CO2 27 09/18/2009 0430   GLUCOSE 123* 07/16/2013 0807   GLUCOSE 95 06/20/2013 1016   GLUCOSE 80 07/07/2011 1405   BUN 14 07/16/2013 0807   BUN 19 07/07/2011 1405   BUN 15 02/18/2010 1000   CREATININE 0.95 07/16/2013 0807   CREATININE 1.26* 07/07/2011 1405   CREATININE 0.8 02/18/2010 1000   CALCIUM 10.0 07/16/2013 0807   CALCIUM 9.4 07/07/2011 1405   CALCIUM 8.0* 09/18/2009 0430   GFRNONAA 56* 07/16/2013 0807   GFRNONAA 40* 07/07/2011 1405   GFRNONAA >60 09/18/2009 0430   GFRAA 65* 07/16/2013 0807   GFRAA 47* 07/07/2011 1405   GFRAA  Value: >60        The eGFR has been calculated using the MDRD equation. This calculation has not been validated in all clinical situations. eGFR's persistently <60 mL/min signify possible Chronic Kidney Disease. 09/18/2009 0430   CBC    Component Value Date/Time   WBC 4.4 07/17/2013 0443   RBC 3.43* 07/17/2013 0443   HGB 10.5* 07/17/2013 0443   HCT 31.7* 07/17/2013 0443   PLT 149* 07/17/2013 0443   MCV 92.4 07/17/2013 0443   MCH 30.6 07/17/2013 0443   MCHC 33.1 07/17/2013 0443   RDW 13.8 07/17/2013 0443   LYMPHSABS 0.7 07/16/2013 0807   MONOABS 0.7 07/16/2013 0807   EOSABS 0.1 07/16/2013 0807   BASOSABS 0.0 07/16/2013 0807   HEPATIC Function Panel  Recent Labs  07/16/13 0807  PROT 7.7   HEMOGLOBIN A1C No components found with this basename: HGA1C,  MPG   CARDIAC ENZYMES Lab Results  Component Value Date   TROPONINI <0.30 07/17/2013  TROPONINI <0.30 07/16/2013   TROPONINI <0.30 07/16/2013   BNP No results found for this basename: PROBNP,  in the last 8760 hours TSH No results found for this basename: TSH,  in the last 8760 hours CHOLESTEROL No results found for this basename: CHOL,  in the last 8760 hours  Scheduled Meds: . aspirin EC  81 mg Oral Daily  . cefTRIAXone (ROCEPHIN)  IV  2 g Intravenous Q24H  . darifenacin  7.5 mg Oral Daily  . furosemide  40 mg Intravenous Once  . metoprolol tartrate  12.5 mg Oral BID  . pantoprazole  40 mg Oral Daily  . phenazopyridine  100 mg Oral TID WC  . polyethylene glycol  17 g Oral Daily  . pregabalin  50 mg Oral Daily  . sodium chloride  3 mL Intravenous Q12H   Continuous Infusions: . sodium chloride 1,000 mL (07/17/13 0239)  .  heparin 950 Units/hr (07/17/13 0639)   PRN Meds:.acetaminophen, acetaminophen, LORazepam, ondansetron (ZOFRAN) IV, ondansetron, sodium chloride, traMADol, zolpidem  Assessment/Plan: Chest pain r/o MI  Atrial fibrillation  Hypertension  Hyperlipidemia  UTI  GERD  PVD Mild MR, TR, AI and PI Mild LV hypertrophy Early diastolic heart failure  One dose IV lasix now. Offered nuclear stress test-patient and daughter refused. May add Xarelto. Small dose lasix, (like 20 mg. daily) with potassium (10 meq. daily)as tolerated. F/U in 1 month.    LOS: 1 day    Dixie Dials  MD  07/17/2013, 12:00 PM

## 2013-07-17 NOTE — Progress Notes (Signed)
ANTICOAGULATION CONSULT NOTE - Follow Up Consult  Pharmacy Consult for Heparin Indication: atrial fibrillation  Allergies  Allergen Reactions  . Gabapentin Other (See Comments)    UNKNOWN  . Gelnique [Oxybutynin] Other (See Comments)    UNKNOWN  . Pentosan Polysulfate Sodium Other (See Comments)    BLURRED VISION/ HEADACHE    Patient Measurements: Height: 5\' 4"  (162.6 cm) Weight: 141 lb 1.5 oz (64 kg) IBW/kg (Calculated) : 54.7 Heparin Dosing Weight:   Vital Signs: Temp: 98.4 F (36.9 C) (07/13 0454) Temp src: Oral (07/13 0454) BP: 154/61 mmHg (07/13 0454) Pulse Rate: 59 (07/13 0454)  Labs:  Recent Labs  07/16/13 0807 07/16/13 1147 07/16/13 1317 07/16/13 1945 07/16/13 2045 07/17/13 0025 07/17/13 0443  HGB 14.1  --   --   --   --   --   --   HCT 41.6  --   --   --   --   --   --   PLT 181  --   --   --   --   --   --   APTT  --  30  --   --   --   --   --   LABPROT  --  13.2  --   --   --   --   --   INR  --  1.00  --   --   --   --   --   HEPARINUNFRC  --   --   --   --  0.60  --  0.82*  CREATININE 0.95  --   --   --   --   --   --   TROPONINI  --   --  <0.30 <0.30  --  <0.30  --     Estimated Creatinine Clearance: 42.1 ml/min (by C-G formula based on Cr of 0.95).   Medications:  Infusions:  . sodium chloride 1,000 mL (07/17/13 0239)  . heparin      Assessment: Patient with high heparin level.  No issues noted per RN.  Goal of Therapy:  Heparin level 0.3-0.7 units/ml Monitor platelets by anticoagulation protocol: Yes   Plan:  Decrease heparin to 950 units/hr Recheck level at [address], [place] 07/17/2013,6:15 AM

## 2013-07-22 LAB — CULTURE, BLOOD (ROUTINE X 2)
CULTURE: NO GROWTH
Culture: NO GROWTH

## 2013-07-27 DIAGNOSIS — R3915 Urgency of urination: Secondary | ICD-10-CM | POA: Diagnosis not present

## 2013-07-27 DIAGNOSIS — N390 Urinary tract infection, site not specified: Secondary | ICD-10-CM | POA: Diagnosis not present

## 2013-07-27 DIAGNOSIS — N3010 Interstitial cystitis (chronic) without hematuria: Secondary | ICD-10-CM | POA: Diagnosis not present

## 2013-07-31 DIAGNOSIS — R82998 Other abnormal findings in urine: Secondary | ICD-10-CM | POA: Diagnosis not present

## 2013-07-31 DIAGNOSIS — IMO0002 Reserved for concepts with insufficient information to code with codable children: Secondary | ICD-10-CM | POA: Diagnosis not present

## 2013-07-31 DIAGNOSIS — N3010 Interstitial cystitis (chronic) without hematuria: Secondary | ICD-10-CM | POA: Diagnosis not present

## 2013-07-31 DIAGNOSIS — I4891 Unspecified atrial fibrillation: Secondary | ICD-10-CM | POA: Diagnosis not present

## 2013-07-31 DIAGNOSIS — I10 Essential (primary) hypertension: Secondary | ICD-10-CM | POA: Diagnosis not present

## 2013-07-31 DIAGNOSIS — R809 Proteinuria, unspecified: Secondary | ICD-10-CM | POA: Diagnosis not present

## 2013-08-07 DIAGNOSIS — S99919A Unspecified injury of unspecified ankle, initial encounter: Secondary | ICD-10-CM | POA: Diagnosis not present

## 2013-08-07 DIAGNOSIS — W19XXXA Unspecified fall, initial encounter: Secondary | ICD-10-CM | POA: Insufficient documentation

## 2013-08-07 DIAGNOSIS — I4891 Unspecified atrial fibrillation: Secondary | ICD-10-CM | POA: Diagnosis not present

## 2013-08-07 DIAGNOSIS — IMO0002 Reserved for concepts with insufficient information to code with codable children: Secondary | ICD-10-CM | POA: Diagnosis not present

## 2013-08-07 DIAGNOSIS — S8990XA Unspecified injury of unspecified lower leg, initial encounter: Secondary | ICD-10-CM | POA: Diagnosis not present

## 2013-08-07 DIAGNOSIS — M79609 Pain in unspecified limb: Secondary | ICD-10-CM | POA: Diagnosis not present

## 2013-08-07 DIAGNOSIS — M79605 Pain in left leg: Secondary | ICD-10-CM | POA: Insufficient documentation

## 2013-09-08 DIAGNOSIS — R82998 Other abnormal findings in urine: Secondary | ICD-10-CM | POA: Diagnosis not present

## 2013-09-12 DIAGNOSIS — R109 Unspecified abdominal pain: Secondary | ICD-10-CM | POA: Diagnosis not present

## 2013-09-12 DIAGNOSIS — N3010 Interstitial cystitis (chronic) without hematuria: Secondary | ICD-10-CM | POA: Diagnosis not present

## 2013-09-15 DIAGNOSIS — R3915 Urgency of urination: Secondary | ICD-10-CM | POA: Diagnosis not present

## 2013-09-15 DIAGNOSIS — N3010 Interstitial cystitis (chronic) without hematuria: Secondary | ICD-10-CM | POA: Diagnosis not present

## 2013-09-15 DIAGNOSIS — R109 Unspecified abdominal pain: Secondary | ICD-10-CM | POA: Diagnosis not present

## 2013-09-19 DIAGNOSIS — N3010 Interstitial cystitis (chronic) without hematuria: Secondary | ICD-10-CM | POA: Diagnosis not present

## 2013-09-22 DIAGNOSIS — N3010 Interstitial cystitis (chronic) without hematuria: Secondary | ICD-10-CM | POA: Diagnosis not present

## 2013-09-26 DIAGNOSIS — N3010 Interstitial cystitis (chronic) without hematuria: Secondary | ICD-10-CM | POA: Diagnosis not present

## 2013-09-29 DIAGNOSIS — N3010 Interstitial cystitis (chronic) without hematuria: Secondary | ICD-10-CM | POA: Diagnosis not present

## 2013-09-29 DIAGNOSIS — N390 Urinary tract infection, site not specified: Secondary | ICD-10-CM | POA: Diagnosis not present

## 2013-10-03 DIAGNOSIS — R350 Frequency of micturition: Secondary | ICD-10-CM | POA: Diagnosis not present

## 2013-10-03 DIAGNOSIS — N390 Urinary tract infection, site not specified: Secondary | ICD-10-CM | POA: Diagnosis not present

## 2013-10-10 DIAGNOSIS — N3010 Interstitial cystitis (chronic) without hematuria: Secondary | ICD-10-CM | POA: Diagnosis not present

## 2013-10-10 DIAGNOSIS — E785 Hyperlipidemia, unspecified: Secondary | ICD-10-CM | POA: Diagnosis not present

## 2013-10-10 DIAGNOSIS — I10 Essential (primary) hypertension: Secondary | ICD-10-CM | POA: Diagnosis not present

## 2013-10-10 DIAGNOSIS — R3915 Urgency of urination: Secondary | ICD-10-CM | POA: Diagnosis not present

## 2013-10-10 DIAGNOSIS — M8580 Other specified disorders of bone density and structure, unspecified site: Secondary | ICD-10-CM | POA: Diagnosis not present

## 2013-10-10 DIAGNOSIS — E559 Vitamin D deficiency, unspecified: Secondary | ICD-10-CM | POA: Diagnosis not present

## 2013-10-10 DIAGNOSIS — R350 Frequency of micturition: Secondary | ICD-10-CM | POA: Diagnosis not present

## 2013-10-11 DIAGNOSIS — M859 Disorder of bone density and structure, unspecified: Secondary | ICD-10-CM | POA: Diagnosis not present

## 2013-10-11 DIAGNOSIS — M8580 Other specified disorders of bone density and structure, unspecified site: Secondary | ICD-10-CM | POA: Diagnosis not present

## 2013-10-19 DIAGNOSIS — D649 Anemia, unspecified: Secondary | ICD-10-CM | POA: Diagnosis not present

## 2013-10-19 DIAGNOSIS — I480 Paroxysmal atrial fibrillation: Secondary | ICD-10-CM | POA: Diagnosis not present

## 2013-10-19 DIAGNOSIS — M810 Age-related osteoporosis without current pathological fracture: Secondary | ICD-10-CM | POA: Diagnosis not present

## 2013-10-19 DIAGNOSIS — Z23 Encounter for immunization: Secondary | ICD-10-CM | POA: Diagnosis not present

## 2013-10-19 DIAGNOSIS — R413 Other amnesia: Secondary | ICD-10-CM | POA: Diagnosis not present

## 2013-10-19 DIAGNOSIS — F419 Anxiety disorder, unspecified: Secondary | ICD-10-CM | POA: Diagnosis not present

## 2013-10-19 DIAGNOSIS — N3010 Interstitial cystitis (chronic) without hematuria: Secondary | ICD-10-CM | POA: Diagnosis not present

## 2013-10-19 DIAGNOSIS — Z124 Encounter for screening for malignant neoplasm of cervix: Secondary | ICD-10-CM | POA: Diagnosis not present

## 2013-10-19 DIAGNOSIS — E559 Vitamin D deficiency, unspecified: Secondary | ICD-10-CM | POA: Diagnosis not present

## 2013-10-19 DIAGNOSIS — Z0000 Encounter for general adult medical examination without abnormal findings: Secondary | ICD-10-CM | POA: Diagnosis not present

## 2013-10-26 DIAGNOSIS — R8299 Other abnormal findings in urine: Secondary | ICD-10-CM | POA: Diagnosis not present

## 2013-10-26 DIAGNOSIS — R300 Dysuria: Secondary | ICD-10-CM | POA: Diagnosis not present

## 2013-11-01 DIAGNOSIS — Z23 Encounter for immunization: Secondary | ICD-10-CM | POA: Diagnosis not present

## 2013-11-02 DIAGNOSIS — M79604 Pain in right leg: Secondary | ICD-10-CM | POA: Diagnosis not present

## 2013-11-02 DIAGNOSIS — M8580 Other specified disorders of bone density and structure, unspecified site: Secondary | ICD-10-CM | POA: Diagnosis not present

## 2013-11-02 DIAGNOSIS — M25561 Pain in right knee: Secondary | ICD-10-CM | POA: Diagnosis not present

## 2013-11-02 DIAGNOSIS — M25562 Pain in left knee: Secondary | ICD-10-CM | POA: Diagnosis not present

## 2013-11-17 DIAGNOSIS — R8299 Other abnormal findings in urine: Secondary | ICD-10-CM | POA: Diagnosis not present

## 2013-11-17 DIAGNOSIS — R300 Dysuria: Secondary | ICD-10-CM | POA: Diagnosis not present

## 2013-12-06 DIAGNOSIS — M1990 Unspecified osteoarthritis, unspecified site: Secondary | ICD-10-CM | POA: Diagnosis not present

## 2013-12-06 DIAGNOSIS — Z79891 Long term (current) use of opiate analgesic: Secondary | ICD-10-CM | POA: Diagnosis not present

## 2013-12-06 DIAGNOSIS — G894 Chronic pain syndrome: Secondary | ICD-10-CM | POA: Diagnosis not present

## 2013-12-06 DIAGNOSIS — G5791 Unspecified mononeuropathy of right lower limb: Secondary | ICD-10-CM | POA: Diagnosis not present

## 2013-12-06 DIAGNOSIS — G5792 Unspecified mononeuropathy of left lower limb: Secondary | ICD-10-CM | POA: Diagnosis not present

## 2013-12-06 DIAGNOSIS — Z79899 Other long term (current) drug therapy: Secondary | ICD-10-CM | POA: Diagnosis not present

## 2013-12-11 DIAGNOSIS — N3010 Interstitial cystitis (chronic) without hematuria: Secondary | ICD-10-CM | POA: Diagnosis not present

## 2013-12-11 DIAGNOSIS — R350 Frequency of micturition: Secondary | ICD-10-CM | POA: Diagnosis not present

## 2013-12-22 DIAGNOSIS — R300 Dysuria: Secondary | ICD-10-CM | POA: Diagnosis not present

## 2013-12-22 DIAGNOSIS — R8299 Other abnormal findings in urine: Secondary | ICD-10-CM | POA: Diagnosis not present

## 2014-02-06 DIAGNOSIS — R300 Dysuria: Secondary | ICD-10-CM | POA: Diagnosis not present

## 2014-02-06 DIAGNOSIS — R8299 Other abnormal findings in urine: Secondary | ICD-10-CM | POA: Diagnosis not present

## 2014-02-21 DIAGNOSIS — N390 Urinary tract infection, site not specified: Secondary | ICD-10-CM | POA: Diagnosis not present

## 2014-02-21 DIAGNOSIS — N3010 Interstitial cystitis (chronic) without hematuria: Secondary | ICD-10-CM | POA: Diagnosis not present

## 2014-02-21 DIAGNOSIS — R350 Frequency of micturition: Secondary | ICD-10-CM | POA: Diagnosis not present

## 2014-02-23 DIAGNOSIS — N3010 Interstitial cystitis (chronic) without hematuria: Secondary | ICD-10-CM | POA: Diagnosis not present

## 2014-03-07 DIAGNOSIS — N3010 Interstitial cystitis (chronic) without hematuria: Secondary | ICD-10-CM | POA: Diagnosis not present

## 2014-03-08 ENCOUNTER — Other Ambulatory Visit: Payer: Self-pay | Admitting: Urology

## 2014-03-19 ENCOUNTER — Encounter (HOSPITAL_BASED_OUTPATIENT_CLINIC_OR_DEPARTMENT_OTHER): Payer: Self-pay | Admitting: *Deleted

## 2014-03-19 NOTE — Progress Notes (Signed)
SPOKE W/ DAUGHTER, PAULA, PT'S MEMORY IMPAIRED.  PAULA SHOULD BE IN PRE-OP WITH PT. NPO AFTER MN. ARRIVE AT 0600. NEEDS ISTAT. CURRENT EKG IN CHART AND EPIC.  WILL TAKE AM MEDS W/ SIPS OF WATER DOS.

## 2014-03-21 NOTE — H&P (Signed)
History of Present Illness   Christine [name] has interstitial cystitis. She told Christine [name] today she has some suprapubic discomfort and she thinks it is different than her IC. Her urine sent for culture.  She has interstitial cystitis with minimal response to her last hydrodistention. She has done well in the past. She has seen Dr Christine [name] for a lot of medical therapies. In the past she was 60% better with her frequency on VESIcare. She actually had called and canceled her last hydrodistention. Her feeling to urinate and suprapubic pain was much worse February 21, 2014 and we decided to do rescue treatments every 2 days and I was going to reevaluate her this week. I thought the research study or another hydrodistention would be prudent to consider.  Review of Systems: No change in bowel or neurologic systems.   Urinalysis: I reviewed, negative.    Past Medical History Problems  1. History of Arthritis 2. History of esophageal reflux (Z87.19) 3. History of heartburn (Z87.898) 4. History of hypercholesterolemia (Z86.39) 5. History of hypertension (Z86.79) 6. History of kidney stones (Z87.442) 7. History of Ischemic Stroke  Surgical History Problems  1. History of Anterior Colporrhaphy, Repair Of Cystocele 2. History of Bladder Irrigation 3. History of Bladder Irrigation 4. History of Bladder Irrigation 5. History of Bladder Irrigation 6. History of Cystoscopy With Dilation Of Bladder 7. History of Cystoscopy With Dilation Of Bladder 8. History of Cystoscopy With Dilation Of Bladder 9. History of Cystoscopy With Dilation Of Bladder 10. History of Hysterectomy 11. History of Sacrospinous Ligament Fixation For Posthysterectomy Prolapse 12. History of Vaginal Surg Insertion Of Mesh For Pelvic Floor Repair  Current Meds 1. LORazepam TABS;  Therapy: (Recorded:22Oct2009) to Recorded 2. Losartan Potassium 50 MG Oral Tablet;  Therapy: [id number] to Recorded 3. Pantoprazole Sodium 40 MG Oral Tablet  Delayed Release;  Therapy: 30Jun2014 to Recorded 4. Pyridium TABS;  Therapy: (Recorded:13Jan2015) to Recorded 5. TraMADol HCl TABS;  Therapy: (Recorded:17Feb2016) to Recorded 6. VESIcare 10 MG Oral Tablet; Take 1 tablet by mouth every day;  Therapy: 06Oct2015 to (Evaluate:30Sep2016); Last Rx:06Oct2015 Ordered 7. Vitamin D CAPS;  Therapy: (Recorded:22Oct2009) to Recorded 8. Xarelto TABS;  Therapy: (Recorded:23Jul2015) to Recorded  Allergies Medication  1. Gabapentin TABS 2. Gelnique GEL  Family History Problems  1. Family history of Death In The Family Father   33 2. Family history of Death In The Family Mother   1973/04/24. Family history of Family Health Status Number Of Children   1 son 1 daughter 4. No pertinent family history : Father 12. Family history of Urologic Disorder   Kidney stones  Social History Problems  1. Denied: Alcohol Use 2. Caffeine Use   1 per day 3. Exercise Habits   Patient uses her Ab Lounger every other day and she walks to and from her mailbox     daily. Note on a regular exercise. 4. Marital History - Currently Married 5. Never A Smoker 6. Occupation:   Retired 66. Self-reliant In Usual Daily Activities 8. Denied: Tobacco Use  Results/Data  Urine [Data Includes: Last 1 Day]   [id number]  COLOR YELLOW   APPEARANCE CLEAR   SPECIFIC GRAVITY 1.010   pH 7.0   GLUCOSE NEG mg/dL  BILIRUBIN NEG   KETONE NEG mg/dL  BLOOD NEG   PROTEIN NEG mg/dL  UROBILINOGEN 0.2 mg/dL  NITRITE POS   LEUKOCYTE ESTERASE NEGATIVE   SQUAMOUS EPITHELIAL/HPF RARE   WBC 0-2 WBC/hpf  RBC NONE SEEN RBC/hpf  BACTERIA RARE  CRYSTALS NONE SEEN   CASTS NONE SEEN    Plan Chronic interstitial cystitis without hematuria  1. Follow-up Schedule Surgery Office  Follow-up  Status: Complete  Done: [id number] Vaginal pain  2. Start: Hydrocodone-Acetaminophen 5-325 MG Oral Tablet; TAKE 1 TABLET EVERY 4 TO  6 HOURS AS NEEDED FOR PAIN  Discussion/Summary   Christine  [name]'s abdominal pain is worse. She is almost tearful. She has never had narcotics. I decided to give her 40 Vicodin today. We have decided to go ahead with a hydrodistention. I do not think she has an infection, but I will call in 2 days if she does. The rescue treatments do not appear to be helping, so I am not going to give her one today, just in case they may be irritating, though I do not think they are.   Hydrodistention scheduled and 40 Vicodin given.   In regards to her pain, there are no other modifying factors or associated signs or symptoms. She is still going frequently. Her pain is moderately severe. The pain is persisting and gradually getting worse.  After a thorough review of the management options for the patient's condition the patient  elected to proceed with surgical therapy as noted above. We have discussed the potential benefits and risks of the procedure, side effects of the proposed treatment, the likelihood of the patient achieving the goals of the procedure, and any potential problems that might occur during the procedure or recuperation. Informed consent has been obtained.

## 2014-03-21 NOTE — Anesthesia Preprocedure Evaluation (Signed)
Anesthesia Evaluation  Patient identified by MRN, date of birth, ID band Patient awake    Reviewed: Allergy & Precautions, NPO status , Patient's Chart, lab work & pertinent test results  History of Anesthesia Complications Negative for: history of anesthetic complications  Airway Mallampati: II  TM Distance: >3 FB Neck ROM: Full    Dental no notable dental hx. (+) Dental Advisory Given   Pulmonary neg pulmonary ROS,  breath sounds clear to auscultation  Pulmonary exam normal       Cardiovascular hypertension, Pt. on medications + Peripheral Vascular Disease + dysrhythmias Atrial Fibrillation Rhythm:Regular Rate:Normal     Neuro/Psych TIAnegative psych ROS   GI/Hepatic Neg liver ROS, GERD-  Medicated and Controlled,  Endo/Other  negative endocrine ROS  Renal/GU negative Renal ROS  negative genitourinary   Musculoskeletal  (+) Arthritis -, Osteoarthritis,    Abdominal   Peds negative pediatric ROS (+)  Hematology negative hematology ROS (+)   Anesthesia Other Findings   Reproductive/Obstetrics negative OB ROS                             Anesthesia Physical Anesthesia Plan  ASA: II  Anesthesia Plan: General   Post-op Pain Management:    Induction: Intravenous  Airway Management Planned: LMA  Additional Equipment:   Intra-op Plan:   Post-operative Plan: Extubation in OR  Informed Consent: I have reviewed the patients History and Physical, chart, labs and discussed the procedure including the risks, benefits and alternatives for the proposed anesthesia with the patient or authorized representative who has indicated his/her understanding and acceptance.   Dental advisory given  Plan Discussed with: CRNA  Anesthesia Plan Comments:         Anesthesia Quick Evaluation

## 2014-03-22 ENCOUNTER — Ambulatory Visit (HOSPITAL_BASED_OUTPATIENT_CLINIC_OR_DEPARTMENT_OTHER)
Admission: RE | Admit: 2014-03-22 | Discharge: 2014-03-22 | Disposition: A | Discharge status: Home / Self Care | Payer: Medicare Other | Source: Ambulatory Visit | Attending: Urology | Admitting: Urology

## 2014-03-22 ENCOUNTER — Ambulatory Visit (HOSPITAL_BASED_OUTPATIENT_CLINIC_OR_DEPARTMENT_OTHER): Payer: Medicare Other | Admitting: Anesthesiology

## 2014-03-22 ENCOUNTER — Encounter (HOSPITAL_BASED_OUTPATIENT_CLINIC_OR_DEPARTMENT_OTHER): Payer: Self-pay | Admitting: *Deleted

## 2014-03-22 ENCOUNTER — Encounter (HOSPITAL_BASED_OUTPATIENT_CLINIC_OR_DEPARTMENT_OTHER)
Admission: RE | Disposition: A | Discharge status: Home / Self Care | Payer: Self-pay | Source: Ambulatory Visit | Attending: Urology

## 2014-03-22 DIAGNOSIS — Z888 Allergy status to other drugs, medicaments and biological substances status: Secondary | ICD-10-CM | POA: Insufficient documentation

## 2014-03-22 DIAGNOSIS — Z90710 Acquired absence of both cervix and uterus: Secondary | ICD-10-CM | POA: Diagnosis not present

## 2014-03-22 DIAGNOSIS — N3010 Interstitial cystitis (chronic) without hematuria: Secondary | ICD-10-CM | POA: Insufficient documentation

## 2014-03-22 DIAGNOSIS — M1990 Unspecified osteoarthritis, unspecified site: Secondary | ICD-10-CM | POA: Diagnosis not present

## 2014-03-22 DIAGNOSIS — I739 Peripheral vascular disease, unspecified: Secondary | ICD-10-CM | POA: Insufficient documentation

## 2014-03-22 DIAGNOSIS — E780 Pure hypercholesterolemia: Secondary | ICD-10-CM | POA: Diagnosis not present

## 2014-03-22 DIAGNOSIS — K219 Gastro-esophageal reflux disease without esophagitis: Secondary | ICD-10-CM | POA: Insufficient documentation

## 2014-03-22 DIAGNOSIS — I10 Essential (primary) hypertension: Secondary | ICD-10-CM | POA: Insufficient documentation

## 2014-03-22 DIAGNOSIS — I4891 Unspecified atrial fibrillation: Secondary | ICD-10-CM | POA: Diagnosis not present

## 2014-03-22 DIAGNOSIS — Z8673 Personal history of transient ischemic attack (TIA), and cerebral infarction without residual deficits: Secondary | ICD-10-CM | POA: Diagnosis not present

## 2014-03-22 DIAGNOSIS — Z87442 Personal history of urinary calculi: Secondary | ICD-10-CM | POA: Insufficient documentation

## 2014-03-22 DIAGNOSIS — R102 Pelvic and perineal pain: Secondary | ICD-10-CM | POA: Diagnosis not present

## 2014-03-22 HISTORY — DX: Paroxysmal atrial fibrillation: I48.0

## 2014-03-22 HISTORY — PX: CYSTO WITH HYDRODISTENSION: SHX5453

## 2014-03-22 LAB — POCT I-STAT 4, (NA,K, GLUC, HGB,HCT)
Glucose, Bld: 83 mg/dL (ref 70–99)
HCT: 37 % (ref 36.0–46.0)
HEMOGLOBIN: 12.6 g/dL (ref 12.0–15.0)
Potassium: 3.5 mmol/L (ref 3.5–5.1)
Sodium: 141 mmol/L (ref 135–145)

## 2014-03-22 SURGERY — CYSTOSCOPY, WITH BLADDER HYDRODISTENSION
Anesthesia: General | Site: Bladder

## 2014-03-22 MED ORDER — FENTANYL CITRATE 0.05 MG/ML IJ SOLN
INTRAMUSCULAR | Status: AC
  Filled 2014-03-22: qty 4

## 2014-03-22 MED ORDER — ONDANSETRON HCL 4 MG/2ML IJ SOLN
4.0000 mg | Freq: Once | INTRAMUSCULAR | Status: DC | PRN
  Filled 2014-03-22: qty 2

## 2014-03-22 MED ORDER — STERILE WATER FOR IRRIGATION IR SOLN
Status: DC | PRN
  Administered 2014-03-22: 3000 mL via INTRAVESICAL

## 2014-03-22 MED ORDER — FENTANYL CITRATE 0.05 MG/ML IJ SOLN
25.0000 ug | INTRAMUSCULAR | Status: DC | PRN
  Filled 2014-03-22: qty 1

## 2014-03-22 MED ORDER — BUPIVACAINE HCL 0.5 % IJ SOLN: INTRAMUSCULAR | Status: DC | PRN

## 2014-03-22 MED ORDER — LACTATED RINGERS IV SOLN
INTRAVENOUS | Status: DC | PRN
  Administered 2014-03-22: 07:00:00 via INTRAVENOUS

## 2014-03-22 MED ORDER — FENTANYL CITRATE 0.05 MG/ML IJ SOLN
INTRAMUSCULAR | Status: DC | PRN
  Administered 2014-03-22: 12.5 ug via INTRAVENOUS
  Administered 2014-03-22 (×2): 6.25 ug via INTRAVENOUS

## 2014-03-22 MED ORDER — BUPIVACAINE HCL (PF) 0.5 % IJ SOLN
INTRAMUSCULAR | Status: DC | PRN
  Administered 2014-03-22: 08:00:00 via INTRAVESICAL

## 2014-03-22 MED ORDER — CIPROFLOXACIN IN D5W 400 MG/200ML IV SOLN
INTRAVENOUS | Status: AC
  Filled 2014-03-22: qty 200

## 2014-03-22 MED ORDER — PROPOFOL 10 MG/ML IV BOLUS
INTRAVENOUS | Status: DC | PRN
  Administered 2014-03-22: 100 mg via INTRAVENOUS

## 2014-03-22 MED ORDER — LACTATED RINGERS IV SOLN
INTRAVENOUS | Status: DC
  Administered 2014-03-22: 07:00:00 via INTRAVENOUS
  Filled 2014-03-22: qty 1000

## 2014-03-22 MED ORDER — HYDROCODONE-ACETAMINOPHEN 5-325 MG PO TABS: 1.0000 | ORAL_TABLET | Freq: Four times a day (QID) | ORAL | Status: DC | PRN

## 2014-03-22 MED ORDER — LIDOCAINE HCL (CARDIAC) 20 MG/ML IV SOLN
INTRAVENOUS | Status: DC | PRN
  Administered 2014-03-22: 40 mg via INTRAVENOUS

## 2014-03-22 MED ORDER — MIDAZOLAM HCL 2 MG/2ML IJ SOLN
INTRAMUSCULAR | Status: AC
  Filled 2014-03-22: qty 2

## 2014-03-22 MED ORDER — ONDANSETRON HCL 4 MG/2ML IJ SOLN
INTRAMUSCULAR | Status: DC | PRN
Start: 2014-03-22 — End: 2014-03-22
  Administered 2014-03-22: 4 mg via INTRAVENOUS

## 2014-03-22 MED ORDER — CIPROFLOXACIN IN D5W 400 MG/200ML IV SOLN
400.0000 mg | INTRAVENOUS | Status: AC
  Administered 2014-03-22: 400 mg via INTRAVENOUS
  Filled 2014-03-22: qty 200

## 2014-03-22 SURGICAL SUPPLY — 21 items
BAG DRAIN URO-CYSTO SKYTR STRL (DRAIN) ×3 IMPLANT
CANISTER SUCT LVC 12 LTR MEDI- (MISCELLANEOUS) ×3 IMPLANT
CATH FOLEY 2WAY SLVR  5CC 18FR (CATHETERS)
CATH FOLEY 2WAY SLVR 5CC 18FR (CATHETERS) IMPLANT
CATH ROBINSON RED A/P 12FR (CATHETERS) IMPLANT
CATH ROBINSON RED A/P 14FR (CATHETERS) ×3 IMPLANT
CLOTH BEACON ORANGE TIMEOUT ST (SAFETY) ×3 IMPLANT
ELECT REM PT RETURN 9FT ADLT (ELECTROSURGICAL)
ELECTRODE REM PT RTRN 9FT ADLT (ELECTROSURGICAL) IMPLANT
GLOVE BIO SURGEON STRL SZ7.5 (GLOVE) ×3 IMPLANT
GLOVE INDICATOR 7.5 STRL GRN (GLOVE) ×3 IMPLANT
GLOVE SURG SS PI 7.5 STRL IVOR (GLOVE) ×6 IMPLANT
GOWN STRL REIN XL XLG (GOWN DISPOSABLE) IMPLANT
GOWN STRL REUS W/ TWL XL LVL3 (GOWN DISPOSABLE) ×1 IMPLANT
GOWN STRL REUS W/TWL XL LVL3 (GOWN DISPOSABLE) ×5 IMPLANT
NDL SAFETY ECLIPSE 18X1.5 (NEEDLE) ×1 IMPLANT
NEEDLE HYPO 18GX1.5 SHARP (NEEDLE) ×2
PACK CYSTO (CUSTOM PROCEDURE TRAY) ×3 IMPLANT
SUT SILK 0 TIES 10X30 (SUTURE) IMPLANT
SYR 20CC LL (SYRINGE) ×3 IMPLANT
WATER STERILE IRR 3000ML UROMA (IV SOLUTION) ×3 IMPLANT

## 2014-03-22 NOTE — Anesthesia Procedure Notes (Signed)
Procedure Name: LMA Insertion Date/Time: 03/22/2014 7:49 AM Performed by: Justice Rocher Pre-anesthesia Checklist: Patient identified, Emergency Drugs available, Suction available and Patient being monitored Patient Re-evaluated:Patient Re-evaluated prior to inductionOxygen Delivery Method: Circle System Utilized Preoxygenation: Pre-oxygenation with 100% oxygen Intubation Type: IV induction Ventilation: Mask ventilation without difficulty LMA: LMA inserted LMA Size: 3.0 Number of attempts: 1 Airway Equipment and Method: Bite block Placement Confirmation: positive ETCO2 Tube secured with: Tape Dental Injury: Teeth and Oropharynx as per pre-operative assessment

## 2014-03-22 NOTE — Interval H&P Note (Signed)
History and Physical Interval Note:  03/22/2014 7:24 AM  Christine [name]  has presented today for surgery, with the diagnosis of pelvic pain   The various methods of treatment have been discussed with the patient and family. After consideration of risks, benefits and other options for treatment, the patient has consented to  Procedure(s): CYSTOSCOPY/HYDRODISTENSION INSTILLATION OF MARCAINE AND PYRDIUM  (N/A) as a surgical intervention .  The patient's history has been reviewed, patient examined, no change in status, stable for surgery.  I have reviewed the patient's chart and labs.  Questions were answered to the patient's satisfaction.     Donnalyn Juran A

## 2014-03-22 NOTE — Op Note (Signed)
Preoperative diagnosis: Interstitial cystitis Postoperative diagnosis: Interstitial cystitis Surgery: Cystoscopy and bladder installation therapy and hydrodistention Surgeon Dr. Nicki Reaper Zamora [place]  The patient has the above diagnoses and consented the above procedure. 21 French cystoscope was utilized. Bladder mucosa and trigone were normal. Her bladder is very spastic and she leaked around the scope at low volumes in spite of good anesthesia.  She was hydrodistended for 5 minutes at approxi-400 mL I had to occlude the urethra around the scope to ensure hydrodistention. Bladder was emptied. On reinspection she had mild glomerulations throughout her bladder. There was no ulcer to fulgurate  Hopefully the hydrodistention will help release some of her abdominal pain

## 2014-03-22 NOTE — Transfer of Care (Signed)
Immediate Anesthesia Transfer of Care Note  Patient: Christine [name]  Procedure(s) Performed: Procedure(s) (LRB): CYSTOSCOPY/HYDRODISTENSION INSTILLATION OF MARCAINE AND PYRDIUM  (N/A)  Patient Location: PACU  Anesthesia Type: General  Level of Consciousness: awake, sedated, patient cooperative and responds to stimulation  Airway & Oxygen Therapy: Patient Spontanous Breathing and Patient connected to face mask oxygen  Post-op Assessment: Report given to PACU RN, Post -op Vital signs reviewed and stable and Patient moving all extremities  Post vital signs: Reviewed and stable  Complications: No apparent anesthesia complications

## 2014-03-22 NOTE — Anesthesia Postprocedure Evaluation (Signed)
  Anesthesia Post-op Note  Patient: Christine [name]  Procedure(s) Performed: Procedure(s) (LRB): CYSTOSCOPY/HYDRODISTENSION INSTILLATION OF MARCAINE AND PYRDIUM  (N/A)  Patient Location: PACU  Anesthesia Type: General  Level of Consciousness: awake and alert   Airway and Oxygen Therapy: Patient Spontanous Breathing  Post-op Pain: mild  Post-op Assessment: Post-op Vital signs reviewed, Patient's Cardiovascular Status Stable, Respiratory Function Stable, Patent Airway and No signs of Nausea or vomiting  Last Vitals:  Filed Vitals:   03/22/14 0845  BP: 158/76  Pulse: 58  Temp:   Resp: 14    Post-op Vital Signs: stable   Complications: No apparent anesthesia complications

## 2014-03-22 NOTE — Discharge Instructions (Signed)
I have reviewed discharge instructions in detail with the patient. They will follow-up with me or their physician as scheduled. My nurse will also be calling the patients as per protocol.    CYSTOSCOPY HOME CARE INSTRUCTIONS  Activity: Rest for the remainder of the day.  Do not drive or operate equipment today.  You may resume normal activities in one to two days as instructed by your physician.   Meals: Drink plenty of liquids and eat light foods such as gelatin or soup this evening.  You may return to a normal meal plan tomorrow.  Return to Work: You may return to work in one to two days or as instructed by your physician.  Special Instructions / Symptoms: Call your physician if any of these symptoms occur:   -persistent or heavy bleeding  -bleeding which continues after first few urination  -large blood clots that are difficult to pass  -urine stream diminishes or stops completely  -fever equal to or higher than 101 degrees Farenheit.  -cloudy urine with a strong, foul odor  -severe pain  Females should always wipe from front to back after elimination.  You may feel some burning pain when you urinate.  This should disappear with time.  Applying moist heat to the lower abdomen or a hot tub bath may help relieve the pain. \     Post Anesthesia Home Care Instructions  Activity: Get plenty of rest for the remainder of the day. A responsible adult should stay with you for 24 hours following the procedure.  For the next 24 hours, DO NOT: -Drive a car -Paediatric nurse -Drink alcoholic beverages -Take any medication unless instructed by your physician -Make any legal decisions or sign important papers.  Meals: Start with liquid foods such as gelatin or soup. Progress to regular foods as tolerated. Avoid greasy, spicy, heavy foods. If nausea and/or vomiting occur, drink only clear liquids until the nausea and/or vomiting subsides. Call your physician if vomiting  continues.  Special Instructions/Symptoms: Your throat may feel dry or sore from the anesthesia or the breathing tube placed in your throat during surgery. If this causes discomfort, gargle with warm salt water. The discomfort should disappear within 24 hours.

## 2014-03-23 ENCOUNTER — Encounter (HOSPITAL_BASED_OUTPATIENT_CLINIC_OR_DEPARTMENT_OTHER): Payer: Self-pay | Admitting: Urology

## 2014-04-11 DIAGNOSIS — N3010 Interstitial cystitis (chronic) without hematuria: Secondary | ICD-10-CM | POA: Diagnosis not present

## 2014-04-11 DIAGNOSIS — R350 Frequency of micturition: Secondary | ICD-10-CM | POA: Diagnosis not present

## 2014-05-02 DIAGNOSIS — R300 Dysuria: Secondary | ICD-10-CM | POA: Diagnosis not present

## 2014-05-03 DIAGNOSIS — R3989 Other symptoms and signs involving the genitourinary system: Secondary | ICD-10-CM | POA: Diagnosis not present

## 2014-05-15 DIAGNOSIS — R300 Dysuria: Secondary | ICD-10-CM | POA: Diagnosis not present

## 2014-05-15 DIAGNOSIS — N390 Urinary tract infection, site not specified: Secondary | ICD-10-CM | POA: Diagnosis not present

## 2014-05-15 DIAGNOSIS — R8299 Other abnormal findings in urine: Secondary | ICD-10-CM | POA: Diagnosis not present

## 2014-06-18 DIAGNOSIS — N3010 Interstitial cystitis (chronic) without hematuria: Secondary | ICD-10-CM | POA: Diagnosis not present

## 2014-06-18 DIAGNOSIS — R300 Dysuria: Secondary | ICD-10-CM | POA: Diagnosis not present

## 2014-06-18 DIAGNOSIS — R350 Frequency of micturition: Secondary | ICD-10-CM | POA: Diagnosis not present

## 2014-06-25 ENCOUNTER — Other Ambulatory Visit: Payer: Self-pay | Admitting: Dermatology

## 2014-06-25 DIAGNOSIS — C44329 Squamous cell carcinoma of skin of other parts of face: Secondary | ICD-10-CM | POA: Diagnosis not present

## 2014-06-25 DIAGNOSIS — D0439 Carcinoma in situ of skin of other parts of face: Secondary | ICD-10-CM | POA: Diagnosis not present

## 2014-06-25 DIAGNOSIS — L570 Actinic keratosis: Secondary | ICD-10-CM | POA: Diagnosis not present

## 2014-07-03 DIAGNOSIS — N3010 Interstitial cystitis (chronic) without hematuria: Secondary | ICD-10-CM | POA: Diagnosis not present

## 2014-07-03 DIAGNOSIS — R300 Dysuria: Secondary | ICD-10-CM | POA: Diagnosis not present

## 2014-09-03 ENCOUNTER — Encounter (HOSPITAL_COMMUNITY): Payer: Self-pay

## 2014-09-03 ENCOUNTER — Emergency Department (HOSPITAL_COMMUNITY)
Admission: EM | Admit: 2014-09-03 | Discharge: 2014-09-03 | Disposition: A | Discharge status: Home / Self Care | Payer: Medicare Other | Attending: Emergency Medicine | Admitting: Emergency Medicine

## 2014-09-03 DIAGNOSIS — Z8673 Personal history of transient ischemic attack (TIA), and cerebral infarction without residual deficits: Secondary | ICD-10-CM | POA: Diagnosis not present

## 2014-09-03 DIAGNOSIS — I480 Paroxysmal atrial fibrillation: Secondary | ICD-10-CM | POA: Insufficient documentation

## 2014-09-03 DIAGNOSIS — I10 Essential (primary) hypertension: Secondary | ICD-10-CM | POA: Diagnosis not present

## 2014-09-03 DIAGNOSIS — Z79899 Other long term (current) drug therapy: Secondary | ICD-10-CM | POA: Insufficient documentation

## 2014-09-03 DIAGNOSIS — E785 Hyperlipidemia, unspecified: Secondary | ICD-10-CM | POA: Insufficient documentation

## 2014-09-03 DIAGNOSIS — E559 Vitamin D deficiency, unspecified: Secondary | ICD-10-CM | POA: Insufficient documentation

## 2014-09-03 DIAGNOSIS — R109 Unspecified abdominal pain: Secondary | ICD-10-CM | POA: Insufficient documentation

## 2014-09-03 DIAGNOSIS — M8580 Other specified disorders of bone density and structure, unspecified site: Secondary | ICD-10-CM | POA: Diagnosis not present

## 2014-09-03 DIAGNOSIS — R300 Dysuria: Secondary | ICD-10-CM | POA: Diagnosis present

## 2014-09-03 DIAGNOSIS — Z87448 Personal history of other diseases of urinary system: Secondary | ICD-10-CM | POA: Insufficient documentation

## 2014-09-03 DIAGNOSIS — M1990 Unspecified osteoarthritis, unspecified site: Secondary | ICD-10-CM | POA: Insufficient documentation

## 2014-09-03 DIAGNOSIS — K219 Gastro-esophageal reflux disease without esophagitis: Secondary | ICD-10-CM | POA: Diagnosis not present

## 2014-09-03 DIAGNOSIS — Z8619 Personal history of other infectious and parasitic diseases: Secondary | ICD-10-CM | POA: Diagnosis not present

## 2014-09-03 DIAGNOSIS — Z86010 Personal history of colonic polyps: Secondary | ICD-10-CM | POA: Insufficient documentation

## 2014-09-03 LAB — URINALYSIS, ROUTINE W REFLEX MICROSCOPIC
BILIRUBIN URINE: NEGATIVE
GLUCOSE, UA: NEGATIVE mg/dL
Hgb urine dipstick: NEGATIVE
Ketones, ur: NEGATIVE mg/dL
Leukocytes, UA: NEGATIVE
Nitrite: NEGATIVE
PROTEIN: NEGATIVE mg/dL
Specific Gravity, Urine: 1.004 — ABNORMAL LOW (ref 1.005–1.030)
Urobilinogen, UA: 0.2 mg/dL (ref 0.0–1.0)
pH: 7.5 (ref 5.0–8.0)

## 2014-09-03 LAB — COMPREHENSIVE METABOLIC PANEL
ALT: 12 U/L — ABNORMAL LOW (ref 14–54)
AST: 20 U/L (ref 15–41)
Albumin: 4.8 g/dL (ref 3.5–5.0)
Alkaline Phosphatase: 46 U/L (ref 38–126)
Anion gap: 8 (ref 5–15)
BILIRUBIN TOTAL: 0.6 mg/dL (ref 0.3–1.2)
BUN: 15 mg/dL (ref 6–20)
CO2: 27 mmol/L (ref 22–32)
CREATININE: 0.95 mg/dL (ref 0.44–1.00)
Calcium: 9.9 mg/dL (ref 8.9–10.3)
Chloride: 105 mmol/L (ref 101–111)
GFR calc Af Amer: >60 mL/min (ref >60)
GFR calc non Af Amer: 55 mL/min — ABNORMAL LOW (ref >60)
Glucose, Bld: 103 mg/dL — ABNORMAL HIGH (ref 65–99)
Potassium: 3.7 mmol/L (ref 3.5–5.1)
Sodium: 140 mmol/L (ref 135–145)
Total Protein: 7.6 g/dL (ref 6.5–8.1)

## 2014-09-03 LAB — CBC
HCT: 40 % (ref 36.0–46.0)
Hemoglobin: 13.4 g/dL (ref 12.0–15.0)
MCH: 30.9 pg (ref 26.0–34.0)
MCHC: 33.5 g/dL (ref 30.0–36.0)
MCV: 92.2 fL (ref 78.0–100.0)
PLATELETS: 198 10*3/uL (ref 150–400)
RBC: 4.34 MIL/uL (ref 3.87–5.11)
RDW: 13.6 % (ref 11.5–15.5)
WBC: 6.4 10*3/uL (ref 4.0–10.5)

## 2014-09-03 LAB — LIPASE, BLOOD: Lipase: 19 U/L — ABNORMAL LOW (ref 22–51)

## 2014-09-03 MED ORDER — HYDROMORPHONE HCL 1 MG/ML IJ SOLN
1.0000 mg | Freq: Once | INTRAMUSCULAR | Status: AC
  Administered 2014-09-03: 1 mg via INTRAMUSCULAR
  Filled 2014-09-03: qty 1

## 2014-09-03 NOTE — ED Notes (Signed)
Patient c/o bilateral lower abdominal pain and dysuria  X 1 month.

## 2014-09-03 NOTE — Discharge Instructions (Signed)

## 2014-09-04 LAB — URINE CULTURE: Culture: 5000

## 2014-09-05 NOTE — ED Provider Notes (Signed)
CSN: [id number]     Arrival date & time 09/03/14  1553 History   First MD Initiated Contact with Patient 09/03/14 1935     Chief Complaint  Patient presents with  . Abdominal Pain  . Dysuria     Patient is a 81 y.o. female presenting with abdominal pain and dysuria. The history is provided by the patient and a relative. No language interpreter was used.  Abdominal Pain Associated symptoms: dysuria   Dysuria Associated symptoms: abdominal pain    Ms. Suderman presents for dysuria. Symptoms have been ongoing for the last several years, worse for the last month. She has a history of interstitial cystitis and is followed by urology for this. She denies any fevers, vomiting, diarrhea. She states the pain is uncontrolled at home. Symptoms are severe, constant, worsening.  Past Medical History  Diagnosis Date  . Pelvic pain in female   . Hypertension   . Hyperlipidemia   . Arthritis   . GERD (gastroesophageal reflux disease)   . Interstitial cystitis   . Urgency of urination   . Frequency of urination   . Nocturia   . Diverticulosis   . Osteopenia   . Vitamin D deficiency   . Iron deficiency   . AR (allergic rhinitis)   . History of TIA (transient ischemic attack)   . History of Helicobacter pylori infection   . History of colon polyps     ADENOMATOUS  . Bilateral carotid artery stenosis     MILD --  40% BILATERAL ICA PER DOPPLER JULY 2014  . Wears glasses   . History of shingles   . Impaired memory   . PAF (paroxysmal atrial fibrillation)     dx 07/ 2015   Past Surgical History  Procedure Laterality Date  . Vault suspension with graft/ cystocele repair  09-17-2009  . Ureterolithotomy Bilateral 11-24-2004 LEFT  &  05-29-2006  RIGHT  . Abdominal hysterectomy    . Cysto/ hod/ instillation therapy  10-04-2008  &  05-02-2009  . Cataract extraction w/ intraocular lens  implant, bilateral  2009  . Cysto with hydrodistension  02/24/2011    Procedure: CYSTOSCOPY/HYDRODISTENSION;   Surgeon: Reece Packer, MD;  Location: [facility];  Service: Urology;  Laterality: N/A;  INSTILLATION OF marcaine and  pyridium  . Cysto/ left ureteroscopy w/ left ureteral dilation  04-15-2006  . Cysto with hydrodistension N/A 06/20/2013    Procedure: CYSTOSCOPY/HYDRODISTENSION/INSTALLATION OF PYRIDIUM -MARCAINE 0.5%.;  Surgeon: Reece Packer, MD;  Location: [place];  Service: Urology;  Laterality: N/A;  . Transthoracic echocardiogram  07-17-2013    grade I diastolic dysfuntion/  ef 23-53%/  mild AR and MR/  trivial TR  . Cysto with hydrodistension N/A 03/22/2014    Procedure: CYSTOSCOPY/HYDRODISTENSION INSTILLATION OF MARCAINE AND PYRDIUM ;  Surgeon: Bjorn Loser, MD;  Location: [place];  Service: Urology;  Laterality: N/A;   History reviewed. No pertinent family history. Social History  Substance Use Topics  . Smoking status: Never Smoker   . Smokeless tobacco: Never Used  . Alcohol Use: No   OB History    No data available     Review of Systems  Gastrointestinal: Positive for abdominal pain.  Genitourinary: Positive for dysuria.  All other systems reviewed and are negative.     Allergies  Gabapentin; Gelnique; and Pentosan polysulfate sodium  Home Medications   Prior to Admission medications   Medication Sig Start Date End Date Taking? Authorizing Provider  Cholecalciferol (VITAMIN D3) 1000 UNITS CAPS Take 1 capsule by mouth daily.    Historical Provider, MD  HYDROcodone-acetaminophen (NORCO) 5-325 MG per tablet Take 1 tablet by mouth every 6 (six) hours as needed for moderate pain. 03/22/14   Bjorn Loser, MD  LORazepam (ATIVAN) 0.5 MG tablet Take 0.5 mg by mouth every 8 (eight) hours as needed for anxiety.     Historical Provider, MD  losartan (COZAAR) 50 MG tablet Take 25 mg by mouth every morning.     Historical Provider, MD  pantoprazole (PROTONIX) 40 MG tablet Take 40 mg by mouth every morning.      Historical Provider, MD  phenazopyridine (PYRIDIUM) 100 MG tablet Take 100 mg by mouth 3 (three) times daily as needed. bladder    Historical Provider, MD  polyethylene glycol (MIRALAX / GLYCOLAX) packet Take 17 g by mouth daily.     Historical Provider, MD  Rivaroxaban (XARELTO) 15 MG TABS tablet Take 1 tablet (15 mg total) by mouth daily with supper. 07/17/13   Kinnie Feil, MD  solifenacin (VESICARE) 10 MG tablet Take 10 mg by mouth every morning.     Historical Provider, MD  traMADol (ULTRAM) 50 MG tablet Take 50 mg by mouth every 6 (six) hours as needed for pain.    Historical Provider, MD  zolpidem (AMBIEN) 5 MG tablet Take 5 mg by mouth at bedtime as needed for sleep.    Historical Provider, MD   BP 154/83 mmHg  Pulse 88  Temp(Src) 98 F (36.7 C) (Oral)  Resp 18  SpO2 98% Physical Exam  Constitutional: She is oriented to person, place, and time. She appears well-developed and well-nourished.  HENT:  Head: Normocephalic and atraumatic.  Cardiovascular: Normal rate and regular rhythm.   No murmur heard. Pulmonary/Chest: Effort normal and breath sounds normal. No respiratory distress.  Abdominal: Soft. There is no tenderness. There is no rebound and no guarding.  Musculoskeletal: She exhibits no edema or tenderness.  Neurological: She is alert and oriented to person, place, and time.  Skin: Skin is warm and dry.  Psychiatric: She has a normal mood and affect. Her behavior is normal.  Nursing note and vitals reviewed.   ED Course  Procedures (including critical care time) Labs Review Labs Reviewed  URINALYSIS, ROUTINE W REFLEX MICROSCOPIC (NOT AT [facility]) - Abnormal; Notable for the following:    Specific Gravity, Urine 1.004 (*)    All other components within normal limits  LIPASE, BLOOD - Abnormal; Notable for the following:    Lipase 19 (*)    All other components within normal limits  COMPREHENSIVE METABOLIC PANEL - Abnormal; Notable for the following:    Glucose, Bld  103 (*)    ALT 12 (*)    GFR calc non Af Amer 55 (*)    All other components within normal limits  URINE CULTURE  CBC    Imaging Review No results found. I have personally reviewed and evaluated these images and lab results as part of my medical decision-making.   EKG Interpretation None      MDM   Final diagnoses:  Dysuria    Patient with history of interstitial cystitis here for dysuria. She has no abdominal tenderness on examination. UA is not consistent with UTI. No evidence of acute kidney injury. Reviewed prior imaging, presentation is not consistent with AAA or renal colic. Discussed urology follow-up. Providing one-time dose of pain meds in the emergency department. Return precautions were discussed.  Quintella Reichert, MD 09/05/14 [phone]

## 2014-09-06 DIAGNOSIS — N3010 Interstitial cystitis (chronic) without hematuria: Secondary | ICD-10-CM | POA: Diagnosis not present

## 2014-09-11 DIAGNOSIS — N3010 Interstitial cystitis (chronic) without hematuria: Secondary | ICD-10-CM | POA: Diagnosis not present

## 2014-09-12 ENCOUNTER — Encounter (HOSPITAL_BASED_OUTPATIENT_CLINIC_OR_DEPARTMENT_OTHER): Payer: Self-pay | Admitting: *Deleted

## 2014-09-12 ENCOUNTER — Other Ambulatory Visit: Payer: Self-pay | Admitting: Urology

## 2014-09-12 NOTE — Progress Notes (Signed)
SPOKE W/ DAUGHTER, PAULA, PT IMPAIRED MEMORY.  SHE WILL NEED TO BE WITH PT IN PRE-OP.  NPO AFTER MN.  ARRIVE AT 0600.  CURRENT LAB RESULTS AND EKG IN CHART AND EPIC.  WILL TAKE AM MEDS W/ SIPS OF WATER.

## 2014-09-13 DIAGNOSIS — R3915 Urgency of urination: Secondary | ICD-10-CM | POA: Diagnosis not present

## 2014-09-13 DIAGNOSIS — N3010 Interstitial cystitis (chronic) without hematuria: Secondary | ICD-10-CM | POA: Diagnosis not present

## 2014-09-13 DIAGNOSIS — R300 Dysuria: Secondary | ICD-10-CM | POA: Diagnosis not present

## 2014-09-13 DIAGNOSIS — R350 Frequency of micturition: Secondary | ICD-10-CM | POA: Diagnosis not present

## 2014-09-13 NOTE — H&P (Signed)
History of Present Illness   Christine Christine [name] has interstitial cystitis. She told Christine [name] today she has some suprapubic discomfort and she thinks it is different than her IC. Her urine sent for culture.  She has interstitial cystitis with minimal response to her last hydrodistention. She has done well in the past. She has seen Dr Amalia Hailey for a lot of medical therapies. In the past she was 60% better with her frequency on VESIcare. She actually had called and canceled her last hydrodistention. Her feeling to urinate and suprapubic pain was much worse February 21, 2014 and we decided to do rescue treatments every 2 days and I was going to reevaluate her this week. I thought the research study or another hydrodistention would be prudent to consider.    I saw Christine [name] in June of 2016. She was improved. The hydrodistention in March of 2016 helped her which she was given Marcaine by and she also had Toradol. She called in once to try Elmiron which caused loose bowel movements in the past.   Frequency: Stable.   Urinalysis: Negative.   Christine [name] had worse pain and went to the Emergency Room recently. She feels a little bit better. Advil regular in the last 24 hours has helped some. Clinically, I do not think she is infected.   There are no modifying factors or associated signs or symptoms. There are no aggravating or alleviating factors. Presentation is moderate in severity and ongoing. When she was hydrodistended in March 2016, the bladder was very spastic, and she had mild glomerulations, and she was hydrodistended to 400 mL.    Past Medical History Problems  1. History of Arthritis 2. History of esophageal reflux (Z87.19) 3. History of heartburn (Z87.898) 4. History of hypercholesterolemia (Z86.39) 5. History of hypertension (Z86.79) 6. History of kidney stones (Z87.442) 7. History of Ischemic Stroke  Surgical History Problems  1. History of Anterior Colporrhaphy, Repair Of Cystocele 2. History of  Bladder Irrigation 3. History of Bladder Irrigation 4. History of Bladder Irrigation 5. History of Bladder Irrigation 6. History of Bladder Irrigation 7. History of Cystoscopy With Dilation Of Bladder 8. History of Cystoscopy With Dilation Of Bladder 9. History of Cystoscopy With Dilation Of Bladder 10. History of Cystoscopy With Dilation Of Bladder 11. History of Cystoscopy With Dilation Of Bladder 12. History of Hysterectomy 13. History of Sacrospinous Ligament Fixation For Posthysterectomy Prolapse 14. History of Vaginal Surg Insertion Of Mesh For Pelvic Floor Repair  Current Meds 1. Elmiron 100 MG Oral Capsule; TAKE 1 CAPSULE 3 TIMES A DAY;  Therapy: [id number] to (Evaluate:01Jul2017)  Requested for: [id number]; Last  Rx:06Jul2016 Ordered 2. Hydrocodone-Acetaminophen 5-325 MG Oral Tablet; TAKE 1 TABLET EVERY 4 TO 6  HOURS AS NEEDED FOR PAIN;  Therapy: [id number] to (Evaluate:09Mar2016); Last Rx:02Mar2016 Ordered 3. LORazepam TABS;  Therapy: (Recorded:22Oct2009) to Recorded 4. Losartan Potassium 50 MG Oral Tablet;  Therapy: [id number] to Recorded 5. Pantoprazole Sodium 40 MG Oral Tablet Delayed Release;  Therapy: 30Jun2014 to Recorded 6. Pyridium TABS;  Therapy: (Recorded:13Jan2015) to Recorded 7. TraMADol HCl TABS;  Therapy: (Recorded:17Feb2016) to Recorded 8. VESIcare 10 MG Oral Tablet; Take 1 tablet by mouth every day;  Therapy: 06Oct2015 to (Evaluate:03Jul2017)  Requested for: [id number]; Last  Rx:08Jul2016 Ordered 9. Vitamin D CAPS;  Therapy: (Recorded:22Oct2009) to Recorded 10. Xarelto TABS;   Therapy: (Recorded:23Jul2015) to Recorded  Allergies Medication  1. Gabapentin TABS 2. Gelnique GEL  Family History Problems  1. Family history of Death In The Family Father   8  2. Family history of Death In The Family Mother   73 3. Family history of Family Health Status Number Of Children   1 [name] 1 daughter 4. No pertinent family history : Father 50. Family history  of Urologic Disorder   Kidney stones  Social History Problems  1. Denied: Alcohol Use 2. Caffeine Use   1 per day 3. Exercise Habits   Patient uses her Ab Lounger every other day and she walks to and from her mailbox     daily. Note on a regular exercise. 4. Marital History - Currently Married 5. Never A Smoker 6. Occupation:   Retired 29. Self-reliant In Usual Daily Activities 8. Denied: Tobacco Use  Vitals Vital Signs [Data Includes: Last 1 Day]  Recorded: 01Sep2016 01:10PM  Blood Pressure: 166 / 77 Temperature: 98 F Heart Rate: 71  Results/Data  Urine [Data Includes: Last 1 Day]   01Sep2016  COLOR YELLOW   APPEARANCE CLEAR   SPECIFIC GRAVITY 1.010   pH 5.5   GLUCOSE NEGATIVE   BILIRUBIN NEGATIVE   KETONE NEGATIVE   BLOOD NEGATIVE   PROTEIN NEGATIVE   NITRITE NEGATIVE   LEUKOCYTE ESTERASE TRACE   SQUAMOUS EPITHELIAL/HPF 0-5 HPF  WBC 0-5 WBC/HPF  RBC NONE SEEN RBC/HPF  BACTERIA NONE SEEN HPF  CRYSTALS NONE SEEN HPF  CASTS NONE SEEN LPF  Yeast NONE SEEN HPF   Plan Chronic interstitial cystitis without hematuria  1. Follow-up Schedule Surgery Office  Follow-up  Status: Complete  Done: [id number] 2. URINE CULTURE; Status:In Progress - Specimen/Data Collected;   Done: 01Sep2016 Chronic interstitial cystitis without hematuria, Increased urinary frequency, Urinary urgency  3. Follow-up [place] Instillation Office  Follow-up  Status: Hold For - Date of Service  Requested  for: 22Sep2016  Discussion/Summary   Unfortunately, my treatment options are limited. Repeat hydrodistention versus rescue treatment versus referral to Dr Amalia Hailey.   Christine [name] would like to proceed with the hydrodistention. She will have Marcaine instillations in the short term and cancel the hydrodistention if it works. Utilizing sterile technique, the bladder was drained and 0.25 mL of Marcaine was instilled in her bladder without difficulty. We will do them 2-3 times a week for the next 2 weeks  or so and schedule hydrodistention now. She is going to hold off with a referral to Dr Amalia Hailey at this stage.  After a thorough review of the management options for the patient's condition the patient  elected to proceed with surgical therapy as noted above. We have discussed the potential benefits and risks of the procedure, side effects of the proposed treatment, the likelihood of the patient achieving the goals of the procedure, and any potential problems that might occur during the procedure or recuperation. Informed consent has been obtained.

## 2014-09-17 ENCOUNTER — Ambulatory Visit (HOSPITAL_BASED_OUTPATIENT_CLINIC_OR_DEPARTMENT_OTHER): Payer: Medicare Other | Admitting: Anesthesiology

## 2014-09-17 ENCOUNTER — Encounter (HOSPITAL_BASED_OUTPATIENT_CLINIC_OR_DEPARTMENT_OTHER)
Admission: RE | Disposition: A | Discharge status: Home / Self Care | Payer: Self-pay | Source: Ambulatory Visit | Attending: Urology

## 2014-09-17 ENCOUNTER — Ambulatory Visit (HOSPITAL_BASED_OUTPATIENT_CLINIC_OR_DEPARTMENT_OTHER)
Admission: RE | Admit: 2014-09-17 | Discharge: 2014-09-17 | Disposition: A | Discharge status: Home / Self Care | Payer: Medicare Other | Source: Ambulatory Visit | Attending: Urology | Admitting: Urology

## 2014-09-17 ENCOUNTER — Encounter (HOSPITAL_BASED_OUTPATIENT_CLINIC_OR_DEPARTMENT_OTHER): Payer: Self-pay | Admitting: *Deleted

## 2014-09-17 DIAGNOSIS — N3010 Interstitial cystitis (chronic) without hematuria: Secondary | ICD-10-CM | POA: Insufficient documentation

## 2014-09-17 DIAGNOSIS — E780 Pure hypercholesterolemia: Secondary | ICD-10-CM | POA: Insufficient documentation

## 2014-09-17 DIAGNOSIS — K219 Gastro-esophageal reflux disease without esophagitis: Secondary | ICD-10-CM | POA: Diagnosis not present

## 2014-09-17 DIAGNOSIS — M1990 Unspecified osteoarthritis, unspecified site: Secondary | ICD-10-CM | POA: Insufficient documentation

## 2014-09-17 DIAGNOSIS — Z7901 Long term (current) use of anticoagulants: Secondary | ICD-10-CM | POA: Insufficient documentation

## 2014-09-17 DIAGNOSIS — R102 Pelvic and perineal pain: Secondary | ICD-10-CM | POA: Diagnosis not present

## 2014-09-17 DIAGNOSIS — Z79891 Long term (current) use of opiate analgesic: Secondary | ICD-10-CM | POA: Diagnosis not present

## 2014-09-17 DIAGNOSIS — I10 Essential (primary) hypertension: Secondary | ICD-10-CM | POA: Diagnosis not present

## 2014-09-17 DIAGNOSIS — Z79899 Other long term (current) drug therapy: Secondary | ICD-10-CM | POA: Diagnosis not present

## 2014-09-17 DIAGNOSIS — Z8673 Personal history of transient ischemic attack (TIA), and cerebral infarction without residual deficits: Secondary | ICD-10-CM | POA: Insufficient documentation

## 2014-09-17 HISTORY — PX: CYSTO WITH HYDRODISTENSION: SHX5453

## 2014-09-17 SURGERY — CYSTOSCOPY, WITH BLADDER HYDRODISTENSION
Anesthesia: General

## 2014-09-17 MED ORDER — STERILE WATER FOR IRRIGATION IR SOLN
Status: DC | PRN
  Administered 2014-09-17: 3000 mL

## 2014-09-17 MED ORDER — LACTATED RINGERS IV SOLN
INTRAVENOUS | Status: DC
  Administered 2014-09-17 (×2): via INTRAVENOUS
  Filled 2014-09-17: qty 1000

## 2014-09-17 MED ORDER — FENTANYL CITRATE (PF) 100 MCG/2ML IJ SOLN
INTRAMUSCULAR | Status: DC | PRN
  Administered 2014-09-17 (×6): 12.5 ug via INTRAVENOUS
  Administered 2014-09-17: 25 ug via INTRAVENOUS

## 2014-09-17 MED ORDER — PROPOFOL 10 MG/ML IV BOLUS
INTRAVENOUS | Status: DC | PRN
  Administered 2014-09-17: 150 mg via INTRAVENOUS

## 2014-09-17 MED ORDER — CIPROFLOXACIN IN D5W 400 MG/200ML IV SOLN
INTRAVENOUS | Status: AC
  Filled 2014-09-17: qty 200

## 2014-09-17 MED ORDER — LIDOCAINE HCL (CARDIAC) 20 MG/ML IV SOLN
INTRAVENOUS | Status: DC | PRN
  Administered 2014-09-17: 60 mg via INTRAVENOUS

## 2014-09-17 MED ORDER — MIDAZOLAM HCL 2 MG/2ML IJ SOLN
INTRAMUSCULAR | Status: AC
  Filled 2014-09-17: qty 2

## 2014-09-17 MED ORDER — ONDANSETRON HCL 4 MG/2ML IJ SOLN
4.0000 mg | Freq: Once | INTRAMUSCULAR | Status: DC | PRN
  Filled 2014-09-17: qty 2

## 2014-09-17 MED ORDER — PHENAZOPYRIDINE HCL 200 MG PO TABS
ORAL_TABLET | ORAL | Status: DC | PRN
  Administered 2014-09-17: 15 mL via INTRAVESICAL

## 2014-09-17 MED ORDER — ONDANSETRON HCL 4 MG/2ML IJ SOLN
INTRAMUSCULAR | Status: DC | PRN
  Administered 2014-09-17: 4 mg via INTRAVENOUS

## 2014-09-17 MED ORDER — DEXAMETHASONE SODIUM PHOSPHATE 4 MG/ML IJ SOLN
INTRAMUSCULAR | Status: DC | PRN
  Administered 2014-09-17: 10 mg via INTRAVENOUS

## 2014-09-17 MED ORDER — HYDROMORPHONE HCL 1 MG/ML IJ SOLN
0.2500 mg | INTRAMUSCULAR | Status: DC | PRN
  Filled 2014-09-17: qty 1

## 2014-09-17 MED ORDER — MEPERIDINE HCL 25 MG/ML IJ SOLN
6.2500 mg | INTRAMUSCULAR | Status: DC | PRN
  Filled 2014-09-17: qty 1

## 2014-09-17 MED ORDER — ACETAMINOPHEN 10 MG/ML IV SOLN
INTRAVENOUS | Status: DC | PRN
  Administered 2014-09-17: 1000 mg via INTRAVENOUS

## 2014-09-17 MED ORDER — CIPROFLOXACIN IN D5W 400 MG/200ML IV SOLN
400.0000 mg | INTRAVENOUS | Status: AC
  Administered 2014-09-17: 400 mg via INTRAVENOUS
  Filled 2014-09-17: qty 200

## 2014-09-17 MED ORDER — FENTANYL CITRATE (PF) 100 MCG/2ML IJ SOLN
INTRAMUSCULAR | Status: AC
  Filled 2014-09-17: qty 4

## 2014-09-17 SURGICAL SUPPLY — 19 items
BAG DRAIN URO-CYSTO SKYTR STRL (DRAIN) ×3 IMPLANT
CANISTER SUCT LVC 12 LTR MEDI- (MISCELLANEOUS) IMPLANT
CATH FOLEY 2WAY SLVR  5CC 18FR (CATHETERS)
CATH FOLEY 2WAY SLVR 5CC 18FR (CATHETERS) IMPLANT
CATH ROBINSON RED A/P 12FR (CATHETERS) IMPLANT
CATH ROBINSON RED A/P 14FR (CATHETERS) ×3 IMPLANT
CLOTH BEACON ORANGE TIMEOUT ST (SAFETY) ×3 IMPLANT
ELECT REM PT RETURN 9FT ADLT (ELECTROSURGICAL)
ELECTRODE REM PT RTRN 9FT ADLT (ELECTROSURGICAL) IMPLANT
GLOVE BIO SURGEON STRL SZ7.5 (GLOVE) ×9 IMPLANT
GOWN STRL REUS W/ TWL XL LVL3 (GOWN DISPOSABLE) ×2 IMPLANT
GOWN STRL REUS W/TWL XL LVL3 (GOWN DISPOSABLE) ×4
MANIFOLD NEPTUNE II (INSTRUMENTS) ×3 IMPLANT
NDL SAFETY ECLIPSE 18X1.5 (NEEDLE) ×1 IMPLANT
NEEDLE HYPO 18GX1.5 SHARP (NEEDLE) ×2
PACK CYSTO (CUSTOM PROCEDURE TRAY) ×3 IMPLANT
SUT SILK 0 TIES 10X30 (SUTURE) IMPLANT
SYR 20CC LL (SYRINGE) ×3 IMPLANT
WATER STERILE IRR 3000ML UROMA (IV SOLUTION) ×3 IMPLANT

## 2014-09-17 NOTE — Anesthesia Procedure Notes (Signed)
Procedure Name: LMA Insertion Date/Time: 09/17/2014 7:44 AM Performed by: Justice Rocher Pre-anesthesia Checklist: Patient identified, Emergency Drugs available, Suction available and Patient being monitored Patient Re-evaluated:Patient Re-evaluated prior to inductionOxygen Delivery Method: Circle System Utilized Preoxygenation: Pre-oxygenation with 100% oxygen Intubation Type: IV induction Ventilation: Mask ventilation without difficulty LMA: LMA inserted LMA Size: 4.0 Number of attempts: 1 Airway Equipment and Method: Bite block Placement Confirmation: positive ETCO2 Tube secured with: Tape Dental Injury: Teeth and Oropharynx as per pre-operative assessment

## 2014-09-17 NOTE — Discharge Instructions (Signed)
I have reviewed discharge instructions in detail with the patient. They will follow-up with me or their physician as scheduled. My nurse will also be calling the patients as per protocol.  ° °CYSTOSCOPY HOME CARE INSTRUCTIONS ° °Activity: °Rest for the remainder of the day.  Do not drive or operate equipment today.  You may resume normal activities in one to two days as instructed by your physician.  ° °Meals: °Drink plenty of liquids and eat light foods such as gelatin or soup this evening.  You may return to a normal meal plan tomorrow. ° °Return to Work: °You may return to work in one to two days or as instructed by your physician. ° °Special Instructions / Symptoms: °Call your physician if any of these symptoms occur: ° ° -persistent or heavy bleeding ° -bleeding which continues after first few urination ° -large blood clots that are difficult to pass ° -urine stream diminishes or stops completely ° -fever equal to or higher than 101 degrees Farenheit. ° -cloudy urine with a strong, foul odor ° -severe pain ° °Females should always wipe from front to back after elimination.  You may feel some burning pain when you urinate.  This should disappear with time.  Applying moist heat to the lower abdomen or a hot tub bath may help relieve the pain. \ ° °Follow-Up / Date of Return Visit to Your Physician:  °Call for an appointment to arrange follow-up. ° °Patient Signature:  ________________________________________________________ ° °Nurse's Signature:  ________________________________________________________ ° °Post Anesthesia Home Care Instructions ° °Activity: °Get plenty of rest for the remainder of the day. A responsible adult should stay with you for 24 hours following the procedure.  °For the next 24 hours, DO NOT: °-Drive a car °-Operate machinery °-Drink alcoholic beverages °-Take any medication unless instructed by your physician °-Make any legal decisions or sign important papers. ° °Meals: °Start with liquid  foods such as gelatin or soup. Progress to regular foods as tolerated. Avoid greasy, spicy, heavy foods. If nausea and/or vomiting occur, drink only clear liquids until the nausea and/or vomiting subsides. Call your physician if vomiting continues. ° °Special Instructions/Symptoms: °Your throat may feel dry or sore from the anesthesia or the breathing tube placed in your throat during surgery. If this causes discomfort, gargle with warm salt water. The discomfort should disappear within 24 hours. ° °If you had a scopolamine patch placed behind your ear for the management of post- operative nausea and/or vomiting: ° °1. The medication in the patch is effective for 72 hours, after which it should be removed.  Wrap patch in a tissue and discard in the trash. Wash hands thoroughly with soap and water. °2. You may remove the patch earlier than 72 hours if you experience unpleasant side effects which may include dry mouth, dizziness or visual disturbances. °3. Avoid touching the patch. Wash your hands with soap and water after contact with the patch. °  ° °

## 2014-09-17 NOTE — Anesthesia Postprocedure Evaluation (Signed)
Anesthesia Post Note  Patient: Christine [name]  Procedure(s) Performed: Procedure(s) (LRB): CYSTOSCOPY HYDRODISTENSION INSTILLATION OF MARCAINE AND PYRIDIUM (N/A)  Anesthesia type: general  Patient location: PACU  Post pain: Pain level controlled  Post assessment: Patient's Cardiovascular Status Stable  Last Vitals:  Filed Vitals:   09/17/14 0955  BP: 147/53  Pulse: 59  Temp: 36.6 C  Resp: 18    Post vital signs: Reviewed and stable  Level of consciousness: sedated  Complications: No apparent anesthesia complications

## 2014-09-17 NOTE — Op Note (Signed)
Preoperative diagnosis: Interstitial cystitis Postoperative diagnosis: Interstitial cystitis Surgery: Cystoscopy and bladder installation therapy and hydrodistention Surgeon Dr. Nicki Reaper MacDiarmid  The patient has the above diagnoses and consented the above procedure. 21 French cystoscope was utilized. Bladder mucosa and trigone were normal. Her bladder is very spastic and she leaked around the scope at low volumes in spite of good anesthesia.  She was hydrodistended for 5 minutes at approxi-500 mL I had to occlude the urethra around the scope to ensure hydrodistention. Bladder was emptied. On reinspection she had mild glomerulations throughout her bladder. There was no ulcer to fulgurate  Hopefully the hydrodistention will help release some of her abdominal pain

## 2014-09-17 NOTE — Transfer of Care (Signed)
Immediate Anesthesia Transfer of Care Note  Patient: Christine [name]  Procedure(s) Performed: Procedure(s) (LRB): CYSTOSCOPY HYDRODISTENSION INSTILLATION OF MARCAINE AND PYRIDIUM (N/A)  Patient Location: PACU  Anesthesia Type: General  Level of Consciousness: awake, sedated, patient cooperative and responds to stimulation  Airway & Oxygen Therapy: Patient Spontanous Breathing and Patient connected to face mask oxygen  Post-op Assessment: Report given to PACU RN, Post -op Vital signs reviewed and stable and Patient moving all extremities  Post vital signs: Reviewed and stable  Complications: No apparent anesthesia complications

## 2014-09-17 NOTE — Anesthesia Preprocedure Evaluation (Addendum)
Anesthesia Evaluation  Patient identified by MRN, date of birth, ID band Patient awake    Reviewed: Allergy & Precautions, NPO status , Patient's Chart, lab work & pertinent test results  Airway Mallampati: I  TM Distance: >3 FB Neck ROM: Full    Dental   Pulmonary    Pulmonary exam normal        Cardiovascular hypertension, Pt. on medications Normal cardiovascular exam  ECHO 7/15 Study Conclusions  - Left ventricle: The cavity size was normal. There was mild concentric hypertrophy. Systolic function was normal. The estimated ejection fraction was in the range of 60% to 65%. Wall motion was normal; there were no regional wall motion abnormalities. Doppler parameters are consistent with abnormal left ventricular relaxation (grade 1 diastolic dysfunction). - Aortic valve: There was mild regurgitation. - Mitral valve: Calcified annulus. There was mild regurgitation.     Neuro/Psych    GI/Hepatic GERD  Medicated and Controlled,  Endo/Other    Renal/GU      Musculoskeletal   Abdominal   Peds  Hematology   Anesthesia Other Findings   Reproductive/Obstetrics                            Anesthesia Physical Anesthesia Plan  ASA: II  Anesthesia Plan: General   Post-op Pain Management:    Induction: Intravenous  Airway Management Planned: LMA  Additional Equipment:   Intra-op Plan:   Post-operative Plan: Extubation in OR  Informed Consent: I have reviewed the patients History and Physical, chart, labs and discussed the procedure including the risks, benefits and alternatives for the proposed anesthesia with the patient or authorized representative who has indicated his/her understanding and acceptance.     Plan Discussed with: CRNA and Surgeon  Anesthesia Plan Comments:         Anesthesia Quick Evaluation

## 2014-09-17 NOTE — Interval H&P Note (Signed)
History and Physical Interval Note:  09/17/2014 7:20 AM  Christine [name]  has presented today for surgery, with the diagnosis of PELVIC PAIN  The various methods of treatment have been discussed with the patient and family. After consideration of risks, benefits and other options for treatment, the patient has consented to  Procedure(s): CYSTOSCOPY HYDRODISTENSION INSTILLATION OF MARCAINE AND PYRIDIUM (N/A) as a surgical intervention .  The patient's history has been reviewed, patient examined, no change in status, stable for surgery.  I have reviewed the patient's chart and labs.  Questions were answered to the patient's satisfaction.     Kyisha Fowle A

## 2014-09-18 ENCOUNTER — Encounter (HOSPITAL_BASED_OUTPATIENT_CLINIC_OR_DEPARTMENT_OTHER): Payer: Self-pay | Admitting: Urology

## 2014-09-18 LAB — URINE CULTURE: CULTURE: NO GROWTH

## 2014-09-25 DIAGNOSIS — R3989 Other symptoms and signs involving the genitourinary system: Secondary | ICD-10-CM | POA: Diagnosis not present

## 2014-09-25 DIAGNOSIS — N3010 Interstitial cystitis (chronic) without hematuria: Secondary | ICD-10-CM | POA: Diagnosis not present

## 2014-10-03 DIAGNOSIS — R350 Frequency of micturition: Secondary | ICD-10-CM | POA: Diagnosis not present

## 2014-10-03 DIAGNOSIS — N3010 Interstitial cystitis (chronic) without hematuria: Secondary | ICD-10-CM | POA: Diagnosis not present

## 2014-10-25 DIAGNOSIS — N3010 Interstitial cystitis (chronic) without hematuria: Secondary | ICD-10-CM | POA: Diagnosis not present

## 2014-11-02 DIAGNOSIS — I480 Paroxysmal atrial fibrillation: Secondary | ICD-10-CM | POA: Diagnosis not present

## 2014-11-02 DIAGNOSIS — Z23 Encounter for immunization: Secondary | ICD-10-CM | POA: Diagnosis not present

## 2014-11-02 DIAGNOSIS — Z6821 Body mass index (BMI) 21.0-21.9, adult: Secondary | ICD-10-CM | POA: Diagnosis not present

## 2014-11-02 DIAGNOSIS — Z1389 Encounter for screening for other disorder: Secondary | ICD-10-CM | POA: Diagnosis not present

## 2014-11-02 DIAGNOSIS — I10 Essential (primary) hypertension: Secondary | ICD-10-CM | POA: Diagnosis not present

## 2014-11-02 DIAGNOSIS — R300 Dysuria: Secondary | ICD-10-CM | POA: Diagnosis not present

## 2014-11-02 DIAGNOSIS — R110 Nausea: Secondary | ICD-10-CM | POA: Diagnosis not present

## 2014-11-16 DIAGNOSIS — M79605 Pain in left leg: Secondary | ICD-10-CM | POA: Diagnosis not present

## 2014-11-16 DIAGNOSIS — R300 Dysuria: Secondary | ICD-10-CM | POA: Diagnosis not present

## 2014-11-16 DIAGNOSIS — I10 Essential (primary) hypertension: Secondary | ICD-10-CM | POA: Diagnosis not present

## 2014-11-16 DIAGNOSIS — Z6821 Body mass index (BMI) 21.0-21.9, adult: Secondary | ICD-10-CM | POA: Diagnosis not present

## 2014-11-30 DIAGNOSIS — I10 Essential (primary) hypertension: Secondary | ICD-10-CM | POA: Diagnosis not present

## 2014-11-30 DIAGNOSIS — Z0000 Encounter for general adult medical examination without abnormal findings: Secondary | ICD-10-CM | POA: Insufficient documentation

## 2014-11-30 DIAGNOSIS — E559 Vitamin D deficiency, unspecified: Secondary | ICD-10-CM | POA: Diagnosis not present

## 2014-11-30 DIAGNOSIS — E785 Hyperlipidemia, unspecified: Secondary | ICD-10-CM | POA: Diagnosis not present

## 2014-12-07 DIAGNOSIS — Z1231 Encounter for screening mammogram for malignant neoplasm of breast: Secondary | ICD-10-CM | POA: Diagnosis not present

## 2014-12-07 DIAGNOSIS — K644 Residual hemorrhoidal skin tags: Secondary | ICD-10-CM | POA: Diagnosis not present

## 2014-12-07 DIAGNOSIS — E559 Vitamin D deficiency, unspecified: Secondary | ICD-10-CM | POA: Diagnosis not present

## 2014-12-07 DIAGNOSIS — Z6821 Body mass index (BMI) 21.0-21.9, adult: Secondary | ICD-10-CM | POA: Diagnosis not present

## 2014-12-07 DIAGNOSIS — M859 Disorder of bone density and structure, unspecified: Secondary | ICD-10-CM | POA: Diagnosis not present

## 2014-12-07 DIAGNOSIS — M25562 Pain in left knee: Secondary | ICD-10-CM | POA: Diagnosis not present

## 2014-12-07 DIAGNOSIS — Z0000 Encounter for general adult medical examination without abnormal findings: Secondary | ICD-10-CM | POA: Diagnosis not present

## 2014-12-07 DIAGNOSIS — M25561 Pain in right knee: Secondary | ICD-10-CM | POA: Diagnosis not present

## 2014-12-07 DIAGNOSIS — G459 Transient cerebral ischemic attack, unspecified: Secondary | ICD-10-CM | POA: Diagnosis not present

## 2014-12-07 DIAGNOSIS — I480 Paroxysmal atrial fibrillation: Secondary | ICD-10-CM | POA: Diagnosis not present

## 2014-12-07 DIAGNOSIS — Z1389 Encounter for screening for other disorder: Secondary | ICD-10-CM | POA: Diagnosis not present

## 2014-12-07 DIAGNOSIS — R413 Other amnesia: Secondary | ICD-10-CM | POA: Diagnosis not present

## 2014-12-18 DIAGNOSIS — M25562 Pain in left knee: Secondary | ICD-10-CM | POA: Diagnosis not present

## 2014-12-18 DIAGNOSIS — M1712 Unilateral primary osteoarthritis, left knee: Secondary | ICD-10-CM | POA: Diagnosis not present

## 2014-12-25 DIAGNOSIS — G4700 Insomnia, unspecified: Secondary | ICD-10-CM | POA: Diagnosis not present

## 2014-12-25 DIAGNOSIS — N3010 Interstitial cystitis (chronic) without hematuria: Secondary | ICD-10-CM | POA: Diagnosis not present

## 2015-02-26 DIAGNOSIS — N3010 Interstitial cystitis (chronic) without hematuria: Secondary | ICD-10-CM | POA: Diagnosis not present

## 2015-03-08 ENCOUNTER — Emergency Department (HOSPITAL_COMMUNITY)
Admission: EM | Admit: 2015-03-08 | Discharge: 2015-03-08 | Disposition: A | Discharge status: Home / Self Care | Payer: Medicare Other | Attending: Emergency Medicine | Admitting: Emergency Medicine

## 2015-03-08 ENCOUNTER — Encounter (HOSPITAL_COMMUNITY): Payer: Self-pay | Admitting: *Deleted

## 2015-03-08 DIAGNOSIS — Z86010 Personal history of colonic polyps: Secondary | ICD-10-CM | POA: Insufficient documentation

## 2015-03-08 DIAGNOSIS — Z791 Long term (current) use of non-steroidal anti-inflammatories (NSAID): Secondary | ICD-10-CM | POA: Diagnosis not present

## 2015-03-08 DIAGNOSIS — K219 Gastro-esophageal reflux disease without esophagitis: Secondary | ICD-10-CM | POA: Diagnosis not present

## 2015-03-08 DIAGNOSIS — Z973 Presence of spectacles and contact lenses: Secondary | ICD-10-CM | POA: Diagnosis not present

## 2015-03-08 DIAGNOSIS — Z79899 Other long term (current) drug therapy: Secondary | ICD-10-CM | POA: Insufficient documentation

## 2015-03-08 DIAGNOSIS — N3010 Interstitial cystitis (chronic) without hematuria: Secondary | ICD-10-CM | POA: Insufficient documentation

## 2015-03-08 DIAGNOSIS — E559 Vitamin D deficiency, unspecified: Secondary | ICD-10-CM | POA: Insufficient documentation

## 2015-03-08 DIAGNOSIS — Z8639 Personal history of other endocrine, nutritional and metabolic disease: Secondary | ICD-10-CM | POA: Diagnosis not present

## 2015-03-08 DIAGNOSIS — Z8619 Personal history of other infectious and parasitic diseases: Secondary | ICD-10-CM | POA: Diagnosis not present

## 2015-03-08 DIAGNOSIS — M1990 Unspecified osteoarthritis, unspecified site: Secondary | ICD-10-CM | POA: Insufficient documentation

## 2015-03-08 DIAGNOSIS — Z8673 Personal history of transient ischemic attack (TIA), and cerebral infarction without residual deficits: Secondary | ICD-10-CM | POA: Diagnosis not present

## 2015-03-08 DIAGNOSIS — M8580 Other specified disorders of bone density and structure, unspecified site: Secondary | ICD-10-CM | POA: Insufficient documentation

## 2015-03-08 DIAGNOSIS — I10 Essential (primary) hypertension: Secondary | ICD-10-CM | POA: Insufficient documentation

## 2015-03-08 DIAGNOSIS — R1030 Lower abdominal pain, unspecified: Secondary | ICD-10-CM | POA: Diagnosis present

## 2015-03-08 LAB — URINE MICROSCOPIC-ADD ON

## 2015-03-08 LAB — URINALYSIS, ROUTINE W REFLEX MICROSCOPIC
GLUCOSE, UA: NEGATIVE mg/dL
HGB URINE DIPSTICK: NEGATIVE
Ketones, ur: 40 mg/dL — AB
NITRITE: POSITIVE — AB
PH: 5 (ref 5.0–8.0)
PROTEIN: NEGATIVE mg/dL
SPECIFIC GRAVITY, URINE: 1.038 — AB (ref 1.005–1.030)

## 2015-03-08 MED ORDER — HYDROCODONE-ACETAMINOPHEN 5-325 MG PO TABS: 2.0000 | ORAL_TABLET | ORAL | Status: DC | PRN

## 2015-03-08 MED ORDER — HYDROCODONE-ACETAMINOPHEN 5-325 MG PO TABS
2.0000 | ORAL_TABLET | Freq: Once | ORAL | Status: AC
  Administered 2015-03-08: 2 via ORAL
  Filled 2015-03-08: qty 2

## 2015-03-08 MED ORDER — ONDANSETRON 4 MG PO TBDP
4.0000 mg | ORAL_TABLET | Freq: Once | ORAL | Status: AC
  Administered 2015-03-08: 4 mg via ORAL
  Filled 2015-03-08: qty 1

## 2015-03-08 NOTE — ED Provider Notes (Signed)
CSN: [id number]     Arrival date & time 03/08/15  0510 History   First MD Initiated Contact with Patient 03/08/15 [phone]     Chief Complaint  Patient presents with  . Abdominal Pain     HPI  Zestril evaluation of suprapubic abdominal pain. She has a history of chronic interstitial cystitis. She follows with urology. She sees a Dr. Amalia Hailey, and "interstitial cystitis ". She does get occasional bladder dilutions with Marcaine and Azo. Taking Azo,, and tramadol. States occasionally she gets worsening symptoms. Presents here. No unusual symptoms. Constipated for 24 hours, but took a laxative last night. Not atypical for her. No fevers or nausea. No flank pain.  Past Medical History  Diagnosis Date  . Pelvic pain in female   . Hypertension   . Hyperlipidemia   . Arthritis   . GERD (gastroesophageal reflux disease)   . Interstitial cystitis   . Urgency of urination   . Frequency of urination   . Nocturia   . Diverticulosis   . Osteopenia   . Vitamin D deficiency   . Iron deficiency   . AR (allergic rhinitis)   . History of TIA (transient ischemic attack)   . History of Helicobacter pylori infection   . History of colon polyps     ADENOMATOUS  . Bilateral carotid artery stenosis     MILD --  40% BILATERAL ICA PER DOPPLER JULY 2014  . Wears glasses   . History of shingles   . Impaired memory   . PAF (paroxysmal atrial fibrillation) ([place])     dx 07/ 2015   Past Surgical History  Procedure Laterality Date  . Vault suspension with graft/ cystocele repair  09-17-2009  . Ureterolithotomy Bilateral 11-24-2004 LEFT  &  05-29-2006  RIGHT  . Abdominal hysterectomy    . Cysto/ hod/ instillation therapy  10-04-2008  &  05-02-2009  . Cataract extraction w/ intraocular lens  implant, bilateral  2009  . Cysto with hydrodistension  02/24/2011    Procedure: CYSTOSCOPY/HYDRODISTENSION;  Surgeon: Reece Packer, MD;  Location: [facility];  Service: Urology;  Laterality: N/A;   INSTILLATION OF marcaine and  pyridium  . Cysto/ left ureteroscopy w/ left ureteral dilation  04-15-2006  . Cysto with hydrodistension N/A 06/20/2013    Procedure: CYSTOSCOPY/HYDRODISTENSION/INSTALLATION OF PYRIDIUM -MARCAINE 0.5%.;  Surgeon: Reece Packer, MD;  Location: [place];  Service: Urology;  Laterality: N/A;  . Transthoracic echocardiogram  07-17-2013    grade I diastolic dysfuntion/  ef [id number]  mild AR and MR/  trivial TR  . Cysto with hydrodistension N/A 03/22/2014    Procedure: CYSTOSCOPY/HYDRODISTENSION INSTILLATION OF MARCAINE AND PYRDIUM ;  Surgeon: Bjorn Loser, MD;  Location: [place];  Service: Urology;  Laterality: N/A;  . Cysto with hydrodistension N/A 09/17/2014    Procedure: CYSTOSCOPY HYDRODISTENSION INSTILLATION OF MARCAINE AND PYRIDIUM;  Surgeon: Bjorn Loser, MD;  Location: [place];  Service: Urology;  Laterality: N/A;   History reviewed. No pertinent family history. Social History  Substance Use Topics  . Smoking status: Never Smoker   . Smokeless tobacco: Never Used  . Alcohol Use: No   OB History    No data available     Review of Systems  Constitutional: Negative for fever, chills, diaphoresis, appetite change and fatigue.  HENT: Negative for mouth sores, sore throat and trouble swallowing.   Eyes: Negative for visual disturbance.  Respiratory: Negative for cough, chest tightness, shortness of breath  and wheezing.   Cardiovascular: Negative for chest pain.  Gastrointestinal: Negative for nausea, vomiting, abdominal pain, diarrhea and abdominal distention.  Endocrine: Negative for polydipsia, polyphagia and polyuria.  Genitourinary: Positive for dysuria and difficulty urinating. Negative for frequency and hematuria.  Musculoskeletal: Negative for gait problem.  Skin: Negative for color change, pallor and rash.  Neurological: Negative for dizziness, syncope, light-headedness and headaches.   Hematological: Does not bruise/bleed easily.  Psychiatric/Behavioral: Negative for behavioral problems and confusion.      Allergies  Gabapentin  Home Medications   Prior to Admission medications   Medication Sig Start Date End Date Taking? Authorizing Provider  Cholecalciferol (VITAMIN D3) 1000 UNITS CAPS Take 1 capsule by mouth daily.   Yes Historical Provider, MD  docusate sodium (COLACE) 100 MG capsule Take 100 mg by mouth daily as needed for mild constipation.   Yes Historical Provider, MD  donepezil (ARICEPT) 10 MG tablet Take 10 mg by mouth at bedtime.   Yes Historical Provider, MD  loratadine (CLARITIN) 10 MG tablet Take 10 mg by mouth daily.   Yes Historical Provider, MD  LORazepam (ATIVAN) 0.5 MG tablet Take 0.5 mg by mouth every 8 (eight) hours as needed for anxiety.    Yes Historical Provider, MD  losartan (COZAAR) 50 MG tablet Take 50 mg by mouth every morning.    Yes Historical Provider, MD  mirabegron ER (MYRBETRIQ) 50 MG TB24 tablet Take 50 mg by mouth daily. 12/25/14  Yes Historical Provider, MD  naproxen sodium (ANAPROX) 220 MG tablet Take 220 mg by mouth 2 (two) times daily with a meal.   Yes Historical Provider, MD  pantoprazole (PROTONIX) 40 MG tablet Take 40 mg by mouth every morning.    Yes Historical Provider, MD  phenazopyridine (PYRIDIUM) 100 MG tablet Take 100 mg by mouth 3 (three) times daily as needed. bladder   Yes Historical Provider, MD  polyethylene glycol (MIRALAX / GLYCOLAX) packet Take 17 g by mouth daily.    Yes Historical Provider, MD  Rivaroxaban (XARELTO) 15 MG TABS tablet Take 1 tablet (15 mg total) by mouth daily with supper. 07/17/13  Yes Kinnie Feil, MD  traMADol (ULTRAM) 50 MG tablet Take 50 mg by mouth every 6 (six) hours as needed for pain.   Yes Historical Provider, MD  vitamin B-12 (CYANOCOBALAMIN) 1000 MCG tablet Take 1,000 mcg by mouth daily.   Yes Historical Provider, MD  HYDROcodone-acetaminophen (NORCO/VICODIN) 5-325 MG tablet  Take 2 tablets by mouth every 4 (four) hours as needed. 03/08/15   Tanna Furry, MD   BP 150/72 mmHg  Pulse 60  Temp(Src) 98 F (36.7 C) (Oral)  Resp 16  SpO2 96% Physical Exam  Constitutional: She is oriented to person, place, and time. She appears well-developed and well-nourished. No distress.  HENT:  Head: Normocephalic.  Eyes: Conjunctivae are normal. Pupils are equal, round, and reactive to light. No scleral icterus.  Neck: Normal range of motion. Neck supple. No thyromegaly present.  Cardiovascular: Normal rate and regular rhythm.  Exam reveals no gallop and no friction rub.   No murmur heard. Pulmonary/Chest: Effort normal and breath sounds normal. No respiratory distress. She has no wheezes. She has no rales.  Abdominal: Soft. Bowel sounds are normal. She exhibits no distension. There is no tenderness. There is no rebound.  Musculoskeletal: Normal range of motion.  Neurological: She is alert and oriented to person, place, and time.  Skin: Skin is warm and dry. No rash noted.  Psychiatric: She has a  normal mood and affect. Her behavior is normal.    ED Course  Procedures (including critical care time) Labs Review Labs Reviewed  URINALYSIS, ROUTINE W REFLEX MICROSCOPIC (NOT AT [facility]) - Abnormal; Notable for the following:    Color, Urine RED (*)    APPearance CLOUDY (*)    Specific Gravity, Urine 1.038 (*)    Bilirubin Urine SMALL (*)    Ketones, ur 40 (*)    Nitrite POSITIVE (*)    Leukocytes, UA LARGE (*)    All other components within normal limits  URINE MICROSCOPIC-ADD ON - Abnormal; Notable for the following:    Squamous Epithelial / LPF 0-5 (*)    Bacteria, UA RARE (*)    Crystals CA OXALATE CRYSTALS (*)    All other components within normal limits  URINE CULTURE    Imaging Review No results found. I have personally reviewed and evaluated these images and lab results as part of my medical decision-making.   EKG Interpretation None      MDM   Final  diagnoses:  IC (interstitial cystitis)    Pain is resolved. Urine shows a few cells. We'll culture her urine. Follow-up with primary care, and her urologist regarding her interstitial cystitis.    Tanna Furry, MD 03/08/15 [phone]

## 2015-03-08 NOTE — Discharge Instructions (Signed)
Interstitial Cystitis Interstitial cystitis is a condition that causes inflammation of the bladder. The bladder is a hollow organ in the lower part of your abdomen. It stores urine after the urine is made by your kidneys. With interstitial cystitis, you may have pain in the bladder area. You may also have a frequent and urgent need to urinate. The severity of interstitial cystitis can vary from person to person. You may have flare-ups of the condition, and then it may go away for a while. For many people who have this condition, it becomes a long-term problem. CAUSES The cause of this condition is not known. RISK FACTORS This condition is more likely to develop in women. SYMPTOMS Symptoms of interstitial cystitis vary, and they can change over time. Symptoms may include:  Discomfort or pain in the bladder area. This can range from mild to severe. The pain may change in intensity as the bladder fills with urine or as it empties.  Pelvic pain.  An urgent need to urinate.  Frequent urination.  Pain during sexual intercourse.  Pinpoint bleeding on the bladder wall. For women, the symptoms often get worse during menstruation. DIAGNOSIS This condition is diagnosed by evaluating your symptoms and ruling out other causes. A physical exam will be done. Various tests may be done to rule out other conditions. Common tests include:  Urine tests.  Cystoscopy. In this test, a tool that is like a very thin telescope is used to look into your bladder.  Biopsy. This involves taking a sample of tissue from the bladder wall to be examined under a microscope. TREATMENT There is no cure for interstitial cystitis, but treatment methods are available to control your symptoms. Work closely with your health care provider to find the treatments that will be most effective for you. Treatment options may include:  Medicines to relieve pain and to help reduce the number of times that you feel the need to  urinate.  Bladder training. This involves learning ways to control when you urinate, such as:  Urinating at scheduled times.  Training yourself to delay urination.  Doing exercises (Kegel exercises) to strengthen the muscles that control urine flow.  Lifestyle changes, such as changing your diet or taking steps to control stress.  Use of a device that provides electrical stimulation in order to reduce pain.  A procedure that stretches your bladder by filling it with air or fluid.  Surgery. This is rare. It is only done for extreme cases if other treatments do not help. HOME CARE INSTRUCTIONS  Take medicines only as directed by your health care provider.  Use bladder training techniques as directed.  Keep a bladder diary to find out which foods, liquids, or activities make your symptoms worse.  Use your bladder diary to schedule bathroom trips. If you are away from home, plan to be near a bathroom at each of your scheduled times.  Make sure you urinate just before you leave the house and just before you go to bed.  Do Kegel exercises as directed by your health care provider.  Do not drink alcohol.  Do not use any tobacco products, including cigarettes, chewing tobacco, or electronic cigarettes. If you need help quitting, ask your health care provider.  Make dietary changes as directed by your health care provider. You may need to avoid spicy foods and foods that contain a high amount of potassium.  Limit your drinking of beverages that stimulate urination. These include soda, coffee, and tea.  Keep all follow-up   visits as directed by your health care provider. This is important. SEEK MEDICAL CARE IF:  Your symptoms do not get better after treatment.  Your pain and discomfort are getting worse.  You have more frequent urges to urinate.  You have a fever. SEEK IMMEDIATE MEDICAL CARE IF:  You are not able to control your bladder at all.   This information is not  intended to replace advice given to you by your health care provider. Make sure you discuss any questions you have with your health care provider.   Document Released: 08/23/2003 Document Revised: 01/12/2014 Document Reviewed: 08/29/2013 Elsevier Interactive Patient Education 2016 [employer]  

## 2015-03-08 NOTE — ED Notes (Signed)
Patient is alert and oriented x4.  Patient has complaints of lower abdominal pain that started yesterday. Patient has been taking over the counter pain medications along with AZO.  Patient has chronic interstitial cystitis.  Currently she rates her pain 8 of 10.

## 2015-03-08 NOTE — ED Notes (Signed)
Pt ambulated to RR with 1 assist by family member

## 2015-03-09 LAB — URINE CULTURE
Culture: 2000
Special Requests: NORMAL

## 2015-03-15 DIAGNOSIS — Z0000 Encounter for general adult medical examination without abnormal findings: Secondary | ICD-10-CM | POA: Diagnosis not present

## 2015-03-15 DIAGNOSIS — N3010 Interstitial cystitis (chronic) without hematuria: Secondary | ICD-10-CM | POA: Diagnosis not present

## 2015-03-19 DIAGNOSIS — R350 Frequency of micturition: Secondary | ICD-10-CM | POA: Diagnosis not present

## 2015-03-19 DIAGNOSIS — N3010 Interstitial cystitis (chronic) without hematuria: Secondary | ICD-10-CM | POA: Diagnosis not present

## 2015-03-22 DIAGNOSIS — N3010 Interstitial cystitis (chronic) without hematuria: Secondary | ICD-10-CM | POA: Diagnosis not present

## 2015-03-26 DIAGNOSIS — N3010 Interstitial cystitis (chronic) without hematuria: Secondary | ICD-10-CM | POA: Diagnosis not present

## 2015-03-28 DIAGNOSIS — N3010 Interstitial cystitis (chronic) without hematuria: Secondary | ICD-10-CM | POA: Diagnosis not present

## 2015-04-01 DIAGNOSIS — L570 Actinic keratosis: Secondary | ICD-10-CM | POA: Diagnosis not present

## 2015-04-02 DIAGNOSIS — N3010 Interstitial cystitis (chronic) without hematuria: Secondary | ICD-10-CM | POA: Diagnosis not present

## 2015-04-04 DIAGNOSIS — N3010 Interstitial cystitis (chronic) without hematuria: Secondary | ICD-10-CM | POA: Diagnosis not present

## 2015-04-09 DIAGNOSIS — N3010 Interstitial cystitis (chronic) without hematuria: Secondary | ICD-10-CM | POA: Diagnosis not present

## 2015-04-11 DIAGNOSIS — N3010 Interstitial cystitis (chronic) without hematuria: Secondary | ICD-10-CM | POA: Diagnosis not present

## 2015-04-16 DIAGNOSIS — N3010 Interstitial cystitis (chronic) without hematuria: Secondary | ICD-10-CM | POA: Diagnosis not present

## 2015-04-18 DIAGNOSIS — N3010 Interstitial cystitis (chronic) without hematuria: Secondary | ICD-10-CM | POA: Diagnosis not present

## 2015-04-22 ENCOUNTER — Encounter: Payer: Self-pay | Admitting: Internal Medicine

## 2015-04-23 DIAGNOSIS — N3010 Interstitial cystitis (chronic) without hematuria: Secondary | ICD-10-CM | POA: Diagnosis not present

## 2015-04-25 DIAGNOSIS — N3010 Interstitial cystitis (chronic) without hematuria: Secondary | ICD-10-CM | POA: Diagnosis not present

## 2015-04-30 DIAGNOSIS — Z6821 Body mass index (BMI) 21.0-21.9, adult: Secondary | ICD-10-CM | POA: Diagnosis not present

## 2015-04-30 DIAGNOSIS — N3010 Interstitial cystitis (chronic) without hematuria: Secondary | ICD-10-CM | POA: Diagnosis not present

## 2015-04-30 DIAGNOSIS — M79605 Pain in left leg: Secondary | ICD-10-CM | POA: Diagnosis not present

## 2015-05-02 DIAGNOSIS — M4726 Other spondylosis with radiculopathy, lumbar region: Secondary | ICD-10-CM | POA: Diagnosis not present

## 2015-05-02 DIAGNOSIS — M25562 Pain in left knee: Secondary | ICD-10-CM | POA: Diagnosis not present

## 2015-05-13 DIAGNOSIS — M5416 Radiculopathy, lumbar region: Secondary | ICD-10-CM | POA: Diagnosis not present

## 2015-06-07 DIAGNOSIS — I10 Essential (primary) hypertension: Secondary | ICD-10-CM | POA: Diagnosis not present

## 2015-06-07 DIAGNOSIS — G894 Chronic pain syndrome: Secondary | ICD-10-CM | POA: Diagnosis not present

## 2015-06-07 DIAGNOSIS — N3010 Interstitial cystitis (chronic) without hematuria: Secondary | ICD-10-CM | POA: Diagnosis not present

## 2015-06-07 DIAGNOSIS — E784 Other hyperlipidemia: Secondary | ICD-10-CM | POA: Diagnosis not present

## 2015-06-07 DIAGNOSIS — F418 Other specified anxiety disorders: Secondary | ICD-10-CM | POA: Diagnosis not present

## 2015-06-07 DIAGNOSIS — Z6821 Body mass index (BMI) 21.0-21.9, adult: Secondary | ICD-10-CM | POA: Diagnosis not present

## 2015-06-07 DIAGNOSIS — Z1389 Encounter for screening for other disorder: Secondary | ICD-10-CM | POA: Diagnosis not present

## 2015-07-10 DIAGNOSIS — F329 Major depressive disorder, single episode, unspecified: Secondary | ICD-10-CM | POA: Diagnosis not present

## 2015-07-10 DIAGNOSIS — J301 Allergic rhinitis due to pollen: Secondary | ICD-10-CM | POA: Diagnosis not present

## 2015-07-10 DIAGNOSIS — Z6821 Body mass index (BMI) 21.0-21.9, adult: Secondary | ICD-10-CM | POA: Diagnosis not present

## 2015-07-10 DIAGNOSIS — D692 Other nonthrombocytopenic purpura: Secondary | ICD-10-CM | POA: Diagnosis not present

## 2015-07-10 DIAGNOSIS — L309 Dermatitis, unspecified: Secondary | ICD-10-CM | POA: Diagnosis not present

## 2015-07-31 DIAGNOSIS — M5416 Radiculopathy, lumbar region: Secondary | ICD-10-CM | POA: Diagnosis not present

## 2015-07-31 DIAGNOSIS — M4726 Other spondylosis with radiculopathy, lumbar region: Secondary | ICD-10-CM | POA: Diagnosis not present

## 2015-07-31 DIAGNOSIS — M25562 Pain in left knee: Secondary | ICD-10-CM | POA: Diagnosis not present

## 2015-08-01 ENCOUNTER — Other Ambulatory Visit: Payer: Self-pay | Admitting: Sports Medicine

## 2015-08-01 DIAGNOSIS — M545 Low back pain: Secondary | ICD-10-CM

## 2015-08-12 ENCOUNTER — Ambulatory Visit
Admission: RE | Admit: 2015-08-12 | Discharge: 2015-08-12 | Disposition: A | Discharge status: Home / Self Care | Payer: Medicare Other | Source: Ambulatory Visit | Attending: Sports Medicine | Admitting: Sports Medicine

## 2015-08-12 DIAGNOSIS — M4806 Spinal stenosis, lumbar region: Secondary | ICD-10-CM | POA: Diagnosis not present

## 2015-08-12 DIAGNOSIS — M545 Low back pain: Secondary | ICD-10-CM

## 2015-08-27 DIAGNOSIS — N3010 Interstitial cystitis (chronic) without hematuria: Secondary | ICD-10-CM | POA: Diagnosis not present

## 2015-09-02 DIAGNOSIS — M5416 Radiculopathy, lumbar region: Secondary | ICD-10-CM | POA: Diagnosis not present

## 2015-09-23 DIAGNOSIS — N3010 Interstitial cystitis (chronic) without hematuria: Secondary | ICD-10-CM | POA: Diagnosis not present

## 2015-09-23 DIAGNOSIS — R3915 Urgency of urination: Secondary | ICD-10-CM | POA: Diagnosis not present

## 2015-09-23 DIAGNOSIS — M5416 Radiculopathy, lumbar region: Secondary | ICD-10-CM | POA: Diagnosis not present

## 2015-09-23 DIAGNOSIS — N3090 Cystitis, unspecified without hematuria: Secondary | ICD-10-CM | POA: Diagnosis not present

## 2015-09-26 DIAGNOSIS — N3010 Interstitial cystitis (chronic) without hematuria: Secondary | ICD-10-CM | POA: Diagnosis not present

## 2015-09-26 DIAGNOSIS — N390 Urinary tract infection, site not specified: Secondary | ICD-10-CM | POA: Diagnosis not present

## 2015-09-26 DIAGNOSIS — Z23 Encounter for immunization: Secondary | ICD-10-CM | POA: Diagnosis not present

## 2015-09-26 DIAGNOSIS — N398 Other specified disorders of urinary system: Secondary | ICD-10-CM | POA: Diagnosis not present

## 2015-09-26 DIAGNOSIS — Z6823 Body mass index (BMI) 23.0-23.9, adult: Secondary | ICD-10-CM | POA: Diagnosis not present

## 2015-09-26 DIAGNOSIS — R300 Dysuria: Secondary | ICD-10-CM | POA: Diagnosis not present

## 2015-09-28 DIAGNOSIS — R399 Unspecified symptoms and signs involving the genitourinary system: Secondary | ICD-10-CM | POA: Diagnosis not present

## 2015-09-30 ENCOUNTER — Emergency Department (HOSPITAL_COMMUNITY): Payer: Medicare Other

## 2015-09-30 ENCOUNTER — Encounter (HOSPITAL_COMMUNITY): Payer: Self-pay | Admitting: Emergency Medicine

## 2015-09-30 ENCOUNTER — Inpatient Hospital Stay (HOSPITAL_COMMUNITY)
Admission: EM | Admit: 2015-09-30 | Discharge: 2015-10-02 | DRG: 689 | Disposition: A | Discharge status: Home / Self Care | Payer: Medicare Other | Attending: Internal Medicine | Admitting: Internal Medicine

## 2015-09-30 DIAGNOSIS — Z961 Presence of intraocular lens: Secondary | ICD-10-CM | POA: Diagnosis present

## 2015-09-30 DIAGNOSIS — N3010 Interstitial cystitis (chronic) without hematuria: Secondary | ICD-10-CM | POA: Diagnosis not present

## 2015-09-30 DIAGNOSIS — G9340 Encephalopathy, unspecified: Secondary | ICD-10-CM | POA: Diagnosis present

## 2015-09-30 DIAGNOSIS — M6282 Rhabdomyolysis: Secondary | ICD-10-CM | POA: Diagnosis present

## 2015-09-30 DIAGNOSIS — R4182 Altered mental status, unspecified: Secondary | ICD-10-CM | POA: Diagnosis not present

## 2015-09-30 DIAGNOSIS — Z8673 Personal history of transient ischemic attack (TIA), and cerebral infarction without residual deficits: Secondary | ICD-10-CM

## 2015-09-30 DIAGNOSIS — K219 Gastro-esophageal reflux disease without esophagitis: Secondary | ICD-10-CM | POA: Diagnosis present

## 2015-09-30 DIAGNOSIS — Z9842 Cataract extraction status, left eye: Secondary | ICD-10-CM

## 2015-09-30 DIAGNOSIS — Z90710 Acquired absence of both cervix and uterus: Secondary | ICD-10-CM

## 2015-09-30 DIAGNOSIS — I10 Essential (primary) hypertension: Secondary | ICD-10-CM | POA: Diagnosis present

## 2015-09-30 DIAGNOSIS — Z7901 Long term (current) use of anticoagulants: Secondary | ICD-10-CM

## 2015-09-30 DIAGNOSIS — N179 Acute kidney failure, unspecified: Secondary | ICD-10-CM | POA: Diagnosis not present

## 2015-09-30 DIAGNOSIS — T796XXA Traumatic ischemia of muscle, initial encounter: Secondary | ICD-10-CM | POA: Diagnosis not present

## 2015-09-30 DIAGNOSIS — M1990 Unspecified osteoarthritis, unspecified site: Secondary | ICD-10-CM | POA: Diagnosis present

## 2015-09-30 DIAGNOSIS — Z79899 Other long term (current) drug therapy: Secondary | ICD-10-CM

## 2015-09-30 DIAGNOSIS — Z9841 Cataract extraction status, right eye: Secondary | ICD-10-CM

## 2015-09-30 DIAGNOSIS — E860 Dehydration: Secondary | ICD-10-CM | POA: Diagnosis present

## 2015-09-30 DIAGNOSIS — I480 Paroxysmal atrial fibrillation: Secondary | ICD-10-CM | POA: Diagnosis present

## 2015-09-30 DIAGNOSIS — M25559 Pain in unspecified hip: Secondary | ICD-10-CM

## 2015-09-30 DIAGNOSIS — F0390 Unspecified dementia without behavioral disturbance: Secondary | ICD-10-CM | POA: Diagnosis present

## 2015-09-30 DIAGNOSIS — Y92009 Unspecified place in unspecified non-institutional (private) residence as the place of occurrence of the external cause: Secondary | ICD-10-CM

## 2015-09-30 DIAGNOSIS — Z888 Allergy status to other drugs, medicaments and biological substances status: Secondary | ICD-10-CM

## 2015-09-30 DIAGNOSIS — R402411 Glasgow coma scale score 13-15, in the field [EMT or ambulance]: Secondary | ICD-10-CM | POA: Diagnosis not present

## 2015-09-30 DIAGNOSIS — S299XXA Unspecified injury of thorax, initial encounter: Secondary | ICD-10-CM | POA: Diagnosis not present

## 2015-09-30 DIAGNOSIS — R740 Nonspecific elevation of levels of transaminase and lactic acid dehydrogenase [LDH]: Secondary | ICD-10-CM | POA: Diagnosis present

## 2015-09-30 DIAGNOSIS — W19XXXA Unspecified fall, initial encounter: Secondary | ICD-10-CM | POA: Diagnosis present

## 2015-09-30 LAB — COMPREHENSIVE METABOLIC PANEL
ALT: 65 U/L — ABNORMAL HIGH (ref 14–54)
AST: 234 U/L — ABNORMAL HIGH (ref 15–41)
Albumin: 4.1 g/dL (ref 3.5–5.0)
Alkaline Phosphatase: 42 U/L (ref 38–126)
Anion gap: 16 — ABNORMAL HIGH (ref 5–15)
BUN: 31 mg/dL — ABNORMAL HIGH (ref 6–20)
CO2: 16 mmol/L — ABNORMAL LOW (ref 22–32)
Calcium: 9.7 mg/dL (ref 8.9–10.3)
Chloride: 105 mmol/L (ref 101–111)
Creatinine, Ser: 1.4 mg/dL — ABNORMAL HIGH (ref 0.44–1.00)
GFR calc Af Amer: 40 mL/min — ABNORMAL LOW (ref >60)
GFR calc non Af Amer: 34 mL/min — ABNORMAL LOW (ref >60)
Glucose, Bld: 72 mg/dL (ref 65–99)
Potassium: 4.1 mmol/L (ref 3.5–5.1)
Sodium: 137 mmol/L (ref 135–145)
Total Bilirubin: 1.5 mg/dL — ABNORMAL HIGH (ref 0.3–1.2)
Total Protein: 6.6 g/dL (ref 6.5–8.1)

## 2015-09-30 LAB — URINALYSIS, ROUTINE W REFLEX MICROSCOPIC
Bilirubin Urine: NEGATIVE
Glucose, UA: NEGATIVE mg/dL
Ketones, ur: 15 mg/dL — AB
Leukocytes, UA: NEGATIVE
Nitrite: NEGATIVE
Protein, ur: NEGATIVE mg/dL
Specific Gravity, Urine: 1.015 (ref 1.005–1.030)
pH: 6.5 (ref 5.0–8.0)

## 2015-09-30 LAB — URINE MICROSCOPIC-ADD ON

## 2015-09-30 LAB — CBC
HCT: 41.8 % (ref 36.0–46.0)
Hemoglobin: 14 g/dL (ref 12.0–15.0)
MCH: 30.5 pg (ref 26.0–34.0)
MCHC: 33.5 g/dL (ref 30.0–36.0)
MCV: 91.1 fL (ref 78.0–100.0)
Platelets: 202 10*3/uL (ref 150–400)
RBC: 4.59 MIL/uL (ref 3.87–5.11)
RDW: 14 % (ref 11.5–15.5)
WBC: 11.3 10*3/uL — ABNORMAL HIGH (ref 4.0–10.5)

## 2015-09-30 LAB — CK: Total CK: 12050 U/L — ABNORMAL HIGH (ref 38–234)

## 2015-09-30 LAB — I-STAT CG4 LACTIC ACID, ED: Lactic Acid, Venous: 0.97 mmol/L (ref 0.5–1.9)

## 2015-09-30 LAB — CBG MONITORING, ED: Glucose-Capillary: 73 mg/dL (ref 65–99)

## 2015-09-30 MED ORDER — SODIUM CHLORIDE 0.9 % IV BOLUS (SEPSIS)
1000.0000 mL | Freq: Once | INTRAVENOUS | Status: AC
  Administered 2015-09-30: 1000 mL via INTRAVENOUS

## 2015-09-30 MED ORDER — SODIUM CHLORIDE 0.9 % IV SOLN
INTRAVENOUS | Status: DC
Start: 2015-09-30 — End: 2015-10-02
  Administered 2015-10-01: 1000 mL via INTRAVENOUS
  Administered 2015-10-01: 16:00:00 via INTRAVENOUS
  Administered 2015-10-02: 1000 mL via INTRAVENOUS

## 2015-09-30 NOTE — ED Notes (Signed)
Nurse drawing labs. 

## 2015-09-30 NOTE — ED Provider Notes (Signed)
[facility] DEPT Provider Note   CSN: [id number] Arrival date & time: 09/30/15  1959  By signing my name below, I, Evelene Croon, attest that this documentation has been prepared under the direction and in the presence of Virgel Manifold, MD . Electronically Signed: Evelene Croon, Scribe. 09/30/2015. 9:02 PM.  History   Chief Complaint Chief Complaint  Patient presents with  . Altered Mental Status   LEVEL 5 CAVEAT DUE TO CONFUSION   The history is provided by the patient and a relative. No language interpreter was used.     HPI Comments:  Christine [name] is a 82 y.o. female who presents to the Emergency Department complaining of diffuse back pain s/p fall today.  Pt's son states pt was found on the floor today and was fairly disoriented at that time. She was unable to say how she fell or ended up on the ground but did state that she had not been on the floor for long. Pt's son also notes h/o frequent UTIs which also causes confusion. He states pt was evaluated at an Urgent Care 2 days ago for urinary symptoms and started on an antibiotic; her first dose of this antibiotic was yesterday and pt became disoriented after taking it. Pt's urine today is blue in color; son notes pt's urine is not usually that color. At baseline, per son, pt is often forgetful but is not usually confused or disoriented. No alleviating factors noted. Pt has no other acute complaints or symptoms at this time.    Past Medical History:  Diagnosis Date  . AR (allergic rhinitis)   . Arthritis   . Bilateral carotid artery stenosis    MILD --  40% BILATERAL ICA PER DOPPLER JULY 2014  . Diverticulosis   . Frequency of urination   . GERD (gastroesophageal reflux disease)   . History of colon polyps    ADENOMATOUS  . History of Helicobacter pylori infection   . History of shingles   . History of TIA (transient ischemic attack)   . Hyperlipidemia   . Hypertension   . Impaired memory   . Interstitial cystitis     . Iron deficiency   . Nocturia   . Osteopenia   . PAF (paroxysmal atrial fibrillation) ([place])    dx 07/ 2015  . Pelvic pain in female   . Urgency of urination   . Vitamin D deficiency   . Wears glasses     Patient Active Problem List   Diagnosis Date Noted  . UTI (lower urinary tract infection) 07/16/2013  . PAF (paroxysmal atrial fibrillation) ([place]) 07/16/2013  . Chest pain 07/16/2013  . ABDOMINAL PAIN, UNSPECIFIED SITE 02/03/2010  . DYSPEPSIA 01/18/2008  . ADENOMATOUS COLONIC POLYP 01/17/2008  . HEMORRHOIDS, INTERNAL 01/17/2008  . ESOPHAGITIS 01/17/2008  . DIVERTICULOSIS, COLON 01/17/2008  . HEMORRHOIDS-EXTERNAL 12/07/2007  . GERD 12/07/2007  . CONSTIPATION 12/07/2007  . RECTAL BLEEDING 12/07/2007  . Loss of weight 12/07/2007  . Nausea alone 12/07/2007  . HEARTBURN 12/07/2007  . ABDOMINAL BLOATING 12/07/2007  . PERSONAL HX COLONIC POLYPS 12/07/2007    Past Surgical History:  Procedure Laterality Date  . ABDOMINAL HYSTERECTOMY    . CATARACT EXTRACTION W/ INTRAOCULAR LENS  IMPLANT, BILATERAL  2009  . CYSTO WITH HYDRODISTENSION  02/24/2011   Procedure: CYSTOSCOPY/HYDRODISTENSION;  Surgeon: Reece Packer, MD;  Location: [facility];  Service: Urology;  Laterality: N/A;  INSTILLATION OF marcaine and  pyridium  . CYSTO WITH HYDRODISTENSION N/A 06/20/2013   Procedure: CYSTOSCOPY/HYDRODISTENSION/INSTALLATION  OF PYRIDIUM -MARCAINE 0.5%.;  Surgeon: Reece Packer, MD;  Location: [place];  Service: Urology;  Laterality: N/A;  . CYSTO WITH HYDRODISTENSION N/A 03/22/2014   Procedure: CYSTOSCOPY/HYDRODISTENSION INSTILLATION OF MARCAINE AND PYRDIUM ;  Surgeon: Bjorn Loser, MD;  Location: [place];  Service: Urology;  Laterality: N/A;  . CYSTO WITH HYDRODISTENSION N/A 09/17/2014   Procedure: CYSTOSCOPY HYDRODISTENSION INSTILLATION OF MARCAINE AND PYRIDIUM;  Surgeon: Bjorn Loser, MD;  Location: [place];  Service: Urology;  Laterality: N/A;  . CYSTO/ HOD/ INSTILLATION THERAPY  10-04-2008  &  05-02-2009  . CYSTO/ LEFT URETEROSCOPY W/ LEFT URETERAL DILATION  04-15-2006  . TRANSTHORACIC ECHOCARDIOGRAM  07-17-2013   grade I diastolic dysfuntion/  ef [id number]  mild AR and MR/  trivial TR  . URETEROLITHOTOMY Bilateral 11-24-2004 LEFT  &  05-29-2006  RIGHT  . VAULT SUSPENSION WITH GRAFT/ CYSTOCELE REPAIR  09-17-2009    OB History    No data available       Home Medications    Prior to Admission medications   Medication Sig Start Date End Date Taking? Authorizing Provider  Cholecalciferol (VITAMIN D3) 1000 UNITS CAPS Take 1 capsule by mouth daily.    Historical Provider, MD  docusate sodium (COLACE) 100 MG capsule Take 100 mg by mouth daily as needed for mild constipation.    Historical Provider, MD  donepezil (ARICEPT) 10 MG tablet Take 10 mg by mouth at bedtime.    Historical Provider, MD  HYDROcodone-acetaminophen (NORCO/VICODIN) 5-325 MG tablet Take 2 tablets by mouth every 4 (four) hours as needed. 03/08/15   Tanna Furry, MD  loratadine (CLARITIN) 10 MG tablet Take 10 mg by mouth daily.    Historical Provider, MD  LORazepam (ATIVAN) 0.5 MG tablet Take 0.5 mg by mouth every 8 (eight) hours as needed for anxiety.     Historical Provider, MD  losartan (COZAAR) 50 MG tablet Take 50 mg by mouth every morning.     Historical Provider, MD  mirabegron ER (MYRBETRIQ) 50 MG TB24 tablet Take 50 mg by mouth daily. 12/25/14   Historical Provider, MD  naproxen sodium (ANAPROX) 220 MG tablet Take 220 mg by mouth 2 (two) times daily with a meal.    Historical Provider, MD  pantoprazole (PROTONIX) 40 MG tablet Take 40 mg by mouth every morning.     Historical Provider, MD  phenazopyridine (PYRIDIUM) 100 MG tablet Take 100 mg by mouth 3 (three) times daily as needed. bladder    Historical Provider, MD  polyethylene glycol (MIRALAX / GLYCOLAX) packet Take 17 g by mouth daily.     Historical Provider, MD   Rivaroxaban (XARELTO) 15 MG TABS tablet Take 1 tablet (15 mg total) by mouth daily with supper. 07/17/13   Kinnie Feil, MD  traMADol (ULTRAM) 50 MG tablet Take 50 mg by mouth every 6 (six) hours as needed for pain.    Historical Provider, MD  vitamin B-12 (CYANOCOBALAMIN) 1000 MCG tablet Take 1,000 mcg by mouth daily.    Historical Provider, MD    Family History History reviewed. No pertinent family history.  Social History Social History  Substance Use Topics  . Smoking status: Never Smoker  . Smokeless tobacco: Never Used  . Alcohol use No     Allergies   Gabapentin   Review of Systems Review of Systems  Unable to perform ROS: Mental status change   Physical Exam Updated Vital Signs There were no vitals taken for this visit.  Physical Exam  Constitutional: She appears well-developed and well-nourished. No distress.  HENT:  Head: Normocephalic and atraumatic.  Eyes: Conjunctivae are normal.  Cardiovascular: Normal rate.   Pulmonary/Chest: Effort normal.  Abdominal: Soft. She exhibits no distension. There is tenderness.  Suprapubic tenderness   Neurological: She is alert.  Mildly confused but pleasant   Skin: Skin is warm and dry.  Erythema and ecchymosis to bilateral elbows    Psychiatric: She has a normal mood and affect.  Nursing note and vitals reviewed.    ED Treatments / Results  DIAGNOSTIC STUDIES:     COORDINATION OF CARE:  8:58 PM Discussed treatment plan with pt at bedside and pt agreed to plan.  Labs (all labs ordered are listed, but only abnormal results are displayed) Labs Reviewed  COMPREHENSIVE METABOLIC PANEL - Abnormal; Notable for the following:       Result Value   CO2 16 (*)    BUN 31 (*)    Creatinine, Ser 1.40 (*)    AST 234 (*)    ALT 65 (*)    Total Bilirubin 1.5 (*)    GFR calc non Af Amer 34 (*)    GFR calc Af Amer 40 (*)    Anion gap 16 (*)    All other components within normal limits  CBC - Abnormal; Notable for  the following:    WBC 11.3 (*)    All other components within normal limits  CK - Abnormal; Notable for the following:    Total CK 12,050 (*)    All other components within normal limits  URINALYSIS, ROUTINE W REFLEX MICROSCOPIC (NOT AT [facility]) - Abnormal; Notable for the following:    Color, Urine GREEN (*)    Hgb urine dipstick LARGE (*)    Ketones, ur 15 (*)    All other components within normal limits  URINE MICROSCOPIC-ADD ON - Abnormal; Notable for the following:    Squamous Epithelial / LPF 0-5 (*)    Bacteria, UA FEW (*)    Casts HYALINE CASTS (*)    All other components within normal limits  CBG MONITORING, ED  I-STAT CG4 LACTIC ACID, ED    EKG  EKG Interpretation  Date/Time:  Monday September 30 2015 20:07:53 EDT Ventricular Rate:  81 PR Interval:    QRS Duration: 86 QT Interval:  408 QTC Calculation: 474 R Axis:   4 Text Interpretation:  Sinus rhythm Atrial premature complexes Low voltage, precordial leads Confirmed by Wilson Singer  MD, Cameo Schmiesing [id number]) on 09/30/2015 8:37:31 PM       Radiology Dg Chest 2 View  Result Date: 09/30/2015 CLINICAL DATA:  Altered mental status, fell today.  Back pain. EXAM: CHEST  2 VIEW COMPARISON:  Chest radiograph July 16, 2013 FINDINGS: Cardiomediastinal silhouette is normal. Mild calcific atherosclerosis of the aortic knob. No pleural effusions or focal consolidations. Trachea projects midline and there is no pneumothorax. Soft tissue planes and included osseous structures are non-suspicious. IMPRESSION: No acute cardiopulmonary process. Electronically Signed   By: Elon Alas M.D.   On: 09/30/2015 22:27    Procedures Procedures (including critical care time)  Medications Ordered in ED Medications - No data to display   Initial Impression / Assessment and Plan / ED Course  I have reviewed the triage vital signs and the nursing notes.  Pertinent labs & imaging results that were available during my care of the patient were  reviewed by me and considered in my medical decision making (see chart for details).  Clinical Course    82yF with confusion. She was down for longer than family initially thought. CK 12,000. Pressure marks and ecchymosis on elbows and buttocks. Mild AKI. Potassium ok. Her urine is emerald green. I'm not sure of the significance of this, if anything. Medication related?  Final Clinical Impressions(s) / ED Diagnoses   Final diagnoses:  Altered mental status, unspecified altered mental status type  Non-traumatic rhabdomyolysis  AKI (acute kidney injury) ([place])    New Prescriptions New Prescriptions   No medications on file   I personally preformed the services scribed in my presence. The recorded information has been reviewed is accurate. Virgel Manifold, MD.     Virgel Manifold, MD 09/30/15 [phone]

## 2015-09-30 NOTE — ED Triage Notes (Signed)
Pt here from home, lives alone. Family reports they found patient on the floor with AMS. Pt has questionable dementia at baseline. Pt also has hx of recurrent UTI and has RX for keflex that she has not been taking. Unknown if pt fell.

## 2015-10-01 ENCOUNTER — Observation Stay (HOSPITAL_COMMUNITY): Payer: Medicare Other

## 2015-10-01 DIAGNOSIS — Z8673 Personal history of transient ischemic attack (TIA), and cerebral infarction without residual deficits: Secondary | ICD-10-CM | POA: Diagnosis not present

## 2015-10-01 DIAGNOSIS — G9340 Encephalopathy, unspecified: Secondary | ICD-10-CM | POA: Diagnosis present

## 2015-10-01 DIAGNOSIS — Z888 Allergy status to other drugs, medicaments and biological substances status: Secondary | ICD-10-CM | POA: Diagnosis not present

## 2015-10-01 DIAGNOSIS — K219 Gastro-esophageal reflux disease without esophagitis: Secondary | ICD-10-CM | POA: Diagnosis present

## 2015-10-01 DIAGNOSIS — E860 Dehydration: Secondary | ICD-10-CM | POA: Diagnosis present

## 2015-10-01 DIAGNOSIS — M6282 Rhabdomyolysis: Secondary | ICD-10-CM | POA: Diagnosis present

## 2015-10-01 DIAGNOSIS — M1990 Unspecified osteoarthritis, unspecified site: Secondary | ICD-10-CM | POA: Diagnosis present

## 2015-10-01 DIAGNOSIS — R740 Nonspecific elevation of levels of transaminase and lactic acid dehydrogenase [LDH]: Secondary | ICD-10-CM | POA: Diagnosis present

## 2015-10-01 DIAGNOSIS — T796XXA Traumatic ischemia of muscle, initial encounter: Secondary | ICD-10-CM | POA: Diagnosis not present

## 2015-10-01 DIAGNOSIS — I10 Essential (primary) hypertension: Secondary | ICD-10-CM | POA: Diagnosis not present

## 2015-10-01 DIAGNOSIS — N179 Acute kidney failure, unspecified: Secondary | ICD-10-CM | POA: Diagnosis present

## 2015-10-01 DIAGNOSIS — Z961 Presence of intraocular lens: Secondary | ICD-10-CM | POA: Diagnosis present

## 2015-10-01 DIAGNOSIS — N3010 Interstitial cystitis (chronic) without hematuria: Secondary | ICD-10-CM | POA: Diagnosis present

## 2015-10-01 DIAGNOSIS — M25551 Pain in right hip: Secondary | ICD-10-CM | POA: Diagnosis not present

## 2015-10-01 DIAGNOSIS — W19XXXA Unspecified fall, initial encounter: Secondary | ICD-10-CM | POA: Diagnosis present

## 2015-10-01 DIAGNOSIS — Z9841 Cataract extraction status, right eye: Secondary | ICD-10-CM | POA: Diagnosis not present

## 2015-10-01 DIAGNOSIS — Z7901 Long term (current) use of anticoagulants: Secondary | ICD-10-CM | POA: Diagnosis not present

## 2015-10-01 DIAGNOSIS — M25552 Pain in left hip: Secondary | ICD-10-CM | POA: Diagnosis not present

## 2015-10-01 DIAGNOSIS — F0390 Unspecified dementia without behavioral disturbance: Secondary | ICD-10-CM | POA: Diagnosis present

## 2015-10-01 DIAGNOSIS — I480 Paroxysmal atrial fibrillation: Secondary | ICD-10-CM | POA: Diagnosis present

## 2015-10-01 DIAGNOSIS — Z79899 Other long term (current) drug therapy: Secondary | ICD-10-CM | POA: Diagnosis not present

## 2015-10-01 DIAGNOSIS — Z90710 Acquired absence of both cervix and uterus: Secondary | ICD-10-CM | POA: Diagnosis not present

## 2015-10-01 DIAGNOSIS — Z9842 Cataract extraction status, left eye: Secondary | ICD-10-CM | POA: Diagnosis not present

## 2015-10-01 DIAGNOSIS — Y92009 Unspecified place in unspecified non-institutional (private) residence as the place of occurrence of the external cause: Secondary | ICD-10-CM | POA: Diagnosis not present

## 2015-10-01 LAB — COMPREHENSIVE METABOLIC PANEL
ALT: 57 U/L — ABNORMAL HIGH (ref 14–54)
ANION GAP: 12 (ref 5–15)
AST: 176 U/L — AB (ref 15–41)
Albumin: 3.4 g/dL — ABNORMAL LOW (ref 3.5–5.0)
Alkaline Phosphatase: 38 U/L (ref 38–126)
BILIRUBIN TOTAL: 1.4 mg/dL — AB (ref 0.3–1.2)
BUN: 34 mg/dL — AB (ref 6–20)
CHLORIDE: 112 mmol/L — AB (ref 101–111)
CO2: 15 mmol/L — ABNORMAL LOW (ref 22–32)
Calcium: 8.2 mg/dL — ABNORMAL LOW (ref 8.9–10.3)
Creatinine, Ser: 1.26 mg/dL — ABNORMAL HIGH (ref 0.44–1.00)
GFR calc Af Amer: 45 mL/min — ABNORMAL LOW (ref >60)
GFR calc non Af Amer: 39 mL/min — ABNORMAL LOW (ref >60)
Glucose, Bld: 68 mg/dL (ref 65–99)
POTASSIUM: 4.3 mmol/L (ref 3.5–5.1)
Sodium: 139 mmol/L (ref 135–145)
TOTAL PROTEIN: 5.7 g/dL — AB (ref 6.5–8.1)

## 2015-10-01 LAB — AMMONIA: Ammonia: 23 umol/L (ref 9–35)

## 2015-10-01 LAB — CBC
HEMATOCRIT: 37 % (ref 36.0–46.0)
HEMOGLOBIN: 12 g/dL (ref 12.0–15.0)
MCH: 30 pg (ref 26.0–34.0)
MCHC: 32.4 g/dL (ref 30.0–36.0)
MCV: 92.5 fL (ref 78.0–100.0)
Platelets: 190 10*3/uL (ref 150–400)
RBC: 4 MIL/uL (ref 3.87–5.11)
RDW: 14.6 % (ref 11.5–15.5)
WBC: 8.5 10*3/uL (ref 4.0–10.5)

## 2015-10-01 LAB — CK: Total CK: 7134 U/L — ABNORMAL HIGH (ref 38–234)

## 2015-10-01 MED ORDER — DONEPEZIL HCL 5 MG PO TABS
5.0000 mg | ORAL_TABLET | Freq: Every day | ORAL | Status: DC
  Administered 2015-10-01 (×2): 5 mg via ORAL
  Filled 2015-10-01 (×2): qty 1

## 2015-10-01 MED ORDER — LORAZEPAM 1 MG PO TABS
1.0000 mg | ORAL_TABLET | Freq: Every day | ORAL | Status: DC
  Administered 2015-10-01: 1 mg via ORAL
  Filled 2015-10-01: qty 1

## 2015-10-01 MED ORDER — LORAZEPAM 1 MG PO TABS: 1.0000 mg | ORAL_TABLET | Freq: Every day | ORAL | Status: DC

## 2015-10-01 MED ORDER — VITAMIN D 1000 UNITS PO TABS
1000.0000 [IU] | ORAL_TABLET | Freq: Every day | ORAL | Status: DC
  Administered 2015-10-01 – 2015-10-02 (×2): 1000 [IU] via ORAL
  Filled 2015-10-01 (×2): qty 1

## 2015-10-01 MED ORDER — VITAMIN B-12 1000 MCG PO TABS
1000.0000 ug | ORAL_TABLET | Freq: Every day | ORAL | Status: DC
  Administered 2015-10-01 – 2015-10-02 (×2): 1000 ug via ORAL
  Filled 2015-10-01 (×2): qty 1

## 2015-10-01 MED ORDER — PENTOSAN POLYSULFATE SODIUM 100 MG PO CAPS
200.0000 mg | ORAL_CAPSULE | Freq: Two times a day (BID) | ORAL | Status: DC
  Administered 2015-10-01 – 2015-10-02 (×4): 200 mg via ORAL
  Filled 2015-10-01 (×5): qty 2

## 2015-10-01 MED ORDER — ONDANSETRON HCL 4 MG PO TABS: 4.0000 mg | ORAL_TABLET | Freq: Four times a day (QID) | ORAL | Status: DC | PRN

## 2015-10-01 MED ORDER — TRAMADOL HCL 50 MG PO TABS
50.0000 mg | ORAL_TABLET | Freq: Two times a day (BID) | ORAL | Status: DC | PRN
  Administered 2015-10-01 – 2015-10-02 (×3): 50 mg via ORAL
  Filled 2015-10-01 (×3): qty 1

## 2015-10-01 MED ORDER — PANTOPRAZOLE SODIUM 40 MG PO TBEC
40.0000 mg | DELAYED_RELEASE_TABLET | Freq: Every morning | ORAL | Status: DC
  Administered 2015-10-01 – 2015-10-02 (×2): 40 mg via ORAL
  Filled 2015-10-01 (×2): qty 1

## 2015-10-01 MED ORDER — ONDANSETRON HCL 4 MG/2ML IJ SOLN: 4.0000 mg | Freq: Four times a day (QID) | INTRAMUSCULAR | Status: DC | PRN

## 2015-10-01 MED ORDER — RIVAROXABAN 15 MG PO TABS
15.0000 mg | ORAL_TABLET | Freq: Every day | ORAL | Status: DC
  Administered 2015-10-01 – 2015-10-02 (×2): 15 mg via ORAL
  Filled 2015-10-01 (×2): qty 1

## 2015-10-01 MED ORDER — ACETAMINOPHEN 325 MG PO TABS
650.0000 mg | ORAL_TABLET | Freq: Four times a day (QID) | ORAL | Status: DC | PRN
  Administered 2015-10-01: 650 mg via ORAL
  Filled 2015-10-01: qty 2

## 2015-10-01 MED ORDER — ALBUTEROL SULFATE (2.5 MG/3ML) 0.083% IN NEBU: 2.5000 mg | INHALATION_SOLUTION | RESPIRATORY_TRACT | Status: DC | PRN

## 2015-10-01 NOTE — H&P (Signed)
History and Physical    ALEIJAH RUNNELS BJY:[id number] DOB: Aug 09, 1933 DOA: 09/30/2015  Referring MD/NP/PA: Dr. Juleen China PCP: Ezequiel Kayser, MD  Patient coming from: Home  Chief Complaint: Found down at home  HPI: Christine [name] is a 82 y.o. female with medical history significant of chronic interstitial cystitis, carotid stenosis, dementia, and arthritis; who presents after being found down at home by family.  History is obtained from the patient daughter, as the patient is currently confused and unable to give adequate history at this time. The patient lives alone, but family lives close by and normally checks on her daily. However, this weekend home with the family were out of town since Friday. Her daughter notes that she called routinely and had last spoke with her mother yesterday afternoon around 3 or 4 PM. At that time she noted that her mother had complained of back pain and seemed more confused than usual. She had been diagnosed with a urinary tract infection last Monday and had been started on antibiotics. At her baseline she has chronic interstitial cystitis and normally complains of some dysuria. 3 days ago patient had went to urgent care for continued pain and was given some medication that family is unsure of at this time. Family notes that today her urine was a dark blue/green color that it's never been previously before to their knowledge.   ED Course: Upon admission to the emergency department patient was evaluated and seen to have WBC of 11.3, BUN 31, creatinine 1.4, and CPK 12,050. Chest x-ray was negative for any acute abnormalities. UA was positive for green color large hemoglobin, 15 ketones, 6-30 RBCs, negative for nitrites, and negative for leukocytes.  Review of Systems: Not able to obtain secondary to patient's acute confusion.  Past Medical History:  Diagnosis Date  . AR (allergic rhinitis)   . Arthritis   . Bilateral carotid artery stenosis    MILD --  40% BILATERAL  ICA PER DOPPLER JULY 2014  . Diverticulosis   . Frequency of urination   . GERD (gastroesophageal reflux disease)   . History of colon polyps    ADENOMATOUS  . History of Helicobacter pylori infection   . History of shingles   . History of TIA (transient ischemic attack)   . Hyperlipidemia   . Hypertension   . Impaired memory   . Interstitial cystitis   . Iron deficiency   . Nocturia   . Osteopenia   . PAF (paroxysmal atrial fibrillation) (HCC)    dx 07/ 2015  . Pelvic pain in female   . Urgency of urination   . Vitamin D deficiency   . Wears glasses     Past Surgical History:  Procedure Laterality Date  . ABDOMINAL HYSTERECTOMY    . CATARACT EXTRACTION W/ INTRAOCULAR LENS  IMPLANT, BILATERAL  2009  . CYSTO WITH HYDRODISTENSION  02/24/2011   Procedure: CYSTOSCOPY/HYDRODISTENSION;  Surgeon: Martina Sinner, MD;  Location[facility];  Service: Urology;  Laterality: N/A;  INSTILLATION OF marcaine and  pyridium  . CYSTO WITH HYDRODISTENSION N/A 06/20/2013   Procedure: CYSTOSCOPY/HYDRODISTENSION/INSTALLATION OF PYRIDIUM -MARCAINE 0.5%.;  Surgeon: Martina Sinner, MD;  Location: [facility];  Service: Urology;  Laterality: N/A;  . CYSTO WITH HYDRODISTENSION N/A 03/22/2014   Procedure: CYSTOSCOPY/HYDRODISTENSION INSTILLATION OF MARCAINE AND PYRDIUM ;  Surgeon: Alfredo Martinez, MD;  Location: [facility];  Service: Urology;  Laterality: N/A;  . CYSTO WITH HYDRODISTENSION N/A 09/17/2014   Procedure: CYSTOSCOPY HYDRODISTENSION  INSTILLATION OF MARCAINE AND PYRIDIUM;  Surgeon: Alfredo Martinez, MD;  Location: [facility];  Service: Urology;  Laterality: N/A;  . CYSTO/ HOD/ INSTILLATION THERAPY  10-04-2008  &  05-02-2009  . CYSTO/ LEFT URETEROSCOPY W/ LEFT URETERAL DILATION  04-15-2006  . TRANSTHORACIC ECHOCARDIOGRAM  07-17-2013   grade I diastolic dysfuntion/  ef 60-65%/  mild AR and MR/  trivial TR  . URETEROLITHOTOMY  Bilateral 11-24-2004 LEFT  &  05-29-2006  RIGHT  . VAULT SUSPENSION WITH GRAFT/ CYSTOCELE REPAIR  09-17-2009     reports that she has never smoked. She has never used smokeless tobacco. She reports that she does not drink alcohol or use drugs.  Allergies  Allergen Reactions  . Gabapentin Other (See Comments)    UNKNOWN    History reviewed. No pertinent family history.  Prior to Admission medications   Medication Sig Start Date End Date Taking? Authorizing Provider  Cholecalciferol (VITAMIN D3) 1000 UNITS CAPS Take 1,000 Units by mouth daily.    Yes Historical Provider, MD  donepezil (ARICEPT) 5 MG tablet Take 5 mg by mouth at bedtime. 09/12/15  Yes Historical Provider, MD  hydrOXYzine (ATARAX/VISTARIL) 25 MG tablet Take 25-50 mg by mouth at bedtime.  08/27/15  Yes Historical Provider, MD  loratadine (CLARITIN) 10 MG tablet Take 10 mg by mouth daily.   Yes Historical Provider, MD  LORazepam (ATIVAN) 1 MG tablet Take 1-2 mg by mouth at bedtime.  08/27/15  Yes Historical Provider, MD  losartan (COZAAR) 50 MG tablet Take 50 mg by mouth every morning.    Yes Historical Provider, MD  mirabegron ER (MYRBETRIQ) 50 MG TB24 tablet Take 50 mg by mouth daily. 12/25/14  Yes Historical Provider, MD  pantoprazole (PROTONIX) 40 MG tablet Take 40 mg by mouth every morning.    Yes Historical Provider, MD  pentosan polysulfate (ELMIRON) 100 MG capsule Take 200 mg by mouth 2 (two) times daily. 08/27/15  Yes Historical Provider, MD  phenazopyridine (PYRIDIUM) 100 MG tablet Take 100 mg by mouth 3 (three) times daily as needed. bladder   Yes Historical Provider, MD  polyethylene glycol (MIRALAX / GLYCOLAX) packet Take 17 g by mouth daily.    Yes Historical Provider, MD  Rivaroxaban (XARELTO) 15 MG TABS tablet Take 1 tablet (15 mg total) by mouth daily with supper. Patient taking differently: Take 15 mg by mouth every morning.  07/17/13  Yes Esperanza Sheets, MD  traMADol (ULTRAM) 50 MG tablet Take 50 mg by mouth 2  (two) times daily as needed for moderate pain.    Yes Historical Provider, MD  vitamin B-12 (CYANOCOBALAMIN) 1000 MCG tablet Take 1,000 mcg by mouth daily.    Historical Provider, MD    Physical Exam:  Constitutional: NAD, calm, comfortable Vitals:   09/30/15 2213 09/30/15 2215 09/30/15 2230 09/30/15 2245  BP:  127/73 116/58 (!) 129/47  Pulse:  81 77 (!) 41  Resp:   21 24  Temp: 98.1 F (36.7 C)     TempSrc: Rectal     SpO2:  100% 100% 98%   Eyes: PERRL, lids and conjunctivae normal ENMT: Mucous membranes are moist. Posterior pharynx clear of any exudate or lesions.Normal dentition.  Neck: normal, supple, no masses, no thyromegaly Respiratory: clear to auscultation bilaterally, no wheezing, no crackles. Normal respiratory effort. No accessory muscle use.  Cardiovascular: Regular rate and rhythm, no murmurs / rubs / gallops. No extremity edema. 2+ pedal pulses. No carotid bruits.  Abdomen:mild lower abdominal tenderness, no masses  palpated. No hepatosplenomegaly. Bowel sounds positive.  Musculoskeletal: no clubbing / cyanosis. No joint deformity upper and lower extremities. Good ROM, no contractures. Normal muscle tone.  Skin: Bruising and erythema to the bilateral elbows Neurologic: CN 2-12 grossly intact. Sensation intact, DTR normal. Strength 5/5 in all 4.  Psychiatric: Patient confused Alert and oriented x 1.  Normal mood.     Labs on Admission: I have personally reviewed following labs and imaging studies  CBC:  Recent Labs Lab 09/30/15 2056  WBC 11.3*  HGB 14.0  HCT 41.8  MCV 91.1  PLT 202   Basic Metabolic Panel:  Recent Labs Lab 09/30/15 2056  NA 137  K 4.1  CL 105  CO2 16*  GLUCOSE 72  BUN 31*  CREATININE 1.40*  CALCIUM 9.7   GFR: CrCl cannot be calculated (Unknown ideal weight.). Liver Function Tests:  Recent Labs Lab 09/30/15 2056  AST 234*  ALT 65*  ALKPHOS 42  BILITOT 1.5*  PROT 6.6  ALBUMIN 4.1   No results for input(s): LIPASE,  AMYLASE in the last 168 hours. No results for input(s): AMMONIA in the last 168 hours. Coagulation Profile: No results for input(s): INR, PROTIME in the last 168 hours. Cardiac Enzymes:  Recent Labs Lab 09/30/15 2056  CKTOTAL 12,050*   BNP (last 3 results) No results for input(s): PROBNP in the last 8760 hours. HbA1C: No results for input(s): HGBA1C in the last 72 hours. CBG:  Recent Labs Lab 09/30/15 2105  GLUCAP 73   Lipid Profile: No results for input(s): CHOL, HDL, LDLCALC, TRIG, CHOLHDL, LDLDIRECT in the last 72 hours. Thyroid Function Tests: No results for input(s): TSH, T4TOTAL, FREET4, T3FREE, THYROIDAB in the last 72 hours. Anemia Panel: No results for input(s): VITAMINB12, FOLATE, FERRITIN, TIBC, IRON, RETICCTPCT in the last 72 hours. Urine analysis:    Component Value Date/Time   COLORURINE GREEN (A) 09/30/2015 2020   APPEARANCEUR CLEAR 09/30/2015 2020   LABSPEC 1.015 09/30/2015 2020   PHURINE 6.5 09/30/2015 2020   GLUCOSEU NEGATIVE 09/30/2015 2020   HGBUR LARGE (A) 09/30/2015 2020   BILIRUBINUR NEGATIVE 09/30/2015 2020   KETONESUR 15 (A) 09/30/2015 2020   PROTEINUR NEGATIVE 09/30/2015 2020   UROBILINOGEN 0.2 09/03/2014 1649   NITRITE NEGATIVE 09/30/2015 2020   LEUKOCYTESUR NEGATIVE 09/30/2015 2020   Sepsis Labs: No results found for this or any previous visit (from the past 240 hour(s)).   Radiological Exams on Admission: Dg Chest 2 View  Result Date: 09/30/2015 CLINICAL DATA:  Altered mental status, fell today.  Back pain. EXAM: CHEST  2 VIEW COMPARISON:  Chest radiograph July 16, 2013 FINDINGS: Cardiomediastinal silhouette is normal. Mild calcific atherosclerosis of the aortic knob. No pleural effusions or focal consolidations. Trachea projects midline and there is no pneumothorax. Soft tissue planes and included osseous structures are non-suspicious. IMPRESSION: No acute cardiopulmonary process. Electronically Signed   By: Awilda Metro M.D.   On:  09/30/2015 22:27    EKG: Independently reviewed. Sinus rhythm with premature atrial complexes  Assessment/Plan Rhabdomyolysis with acute kidney injury:  Acute. Patient's creatinine previously had been documented 0.95, but was acutely elevated at 1.4 with a BUN of 31. Patient was found to have elevated CPK of 12,000 - Admit to MedSurg bed - Continuous pulse oximetry - Normal saline IV fluids 150 ml/hour as tolerated - Physical therapy to eval and treat to start on 9/27   Acute encephalopathy: Question if secondary from recent medication given at urgent care versus dehydration versus infection. - Follow-up  with family members on recent medication given - check ammonia level - Continue to monitor  Leukocytosis: Acute. WBC 11.3 on admission. Chest x-ray appears to be clear and UA showed no acute signs of infection. - Repeat CBC in a.m. - Monitor off antibiotics for now   Interstitial cystitis: Chronic - Continue Elmiron  - Held mirabegron, restart when able   Elevated transaminases: Likely secondary to above. -  repeat CMP in am  Paroxysmal atrial fibrillation on chronic anticoagulation - Continue Xarelto  Essential hypertension - will need to restart Cozaar, when able  Dementia - Continue donepezil   DVT prophylaxis: on Xarelto Code Status: Full Family Communication: Discussed patient care with daughter present at bedside Disposition Plan: tbd Consults called: None   Admission status: Observation  Clydie Braun MD [facility] Pager [phone]  If 7PM-7AM, please contact night-coverage [email or web address] Password [email or web address]  10/01/2015, 12:09 AM

## 2015-10-01 NOTE — Progress Notes (Signed)
Late entry for 09/30/15 22:20  Patient arrived to the floor accompanied by daughter, on a stretcher. According to ED RN report patient was found at her residence, fallen with AMS. She reports not remembering if she fell, passed or what.  Her CK was 12,000, WBC 11.3. Patient does have a diagnosis of dementia, TIA, HLD, HTN,  And chronic UTI; also bladder cystitis. Takes pills whole, as long as they are not too big. Unit book given, safety explained, as well as how to use the telephone and remote.  Bed alarm is on, safety maintained.

## 2015-10-01 NOTE — Progress Notes (Signed)
Received report from [facility] on patient, who is not on the floor at this time.

## 2015-10-01 NOTE — Progress Notes (Signed)
PROGRESS NOTE    Christine [name]  NWG:[id number] DOB: 11/01/1933 DOA: 09/30/2015 PCP: Ezequiel Kayser, MD Brief Narrative:Christine [name] is a 82 y.o. female with medical history significant of chronic interstitial cystitis, carotid stenosis, dementia, and arthritis; who was brought to the ER after being found down at home by family, last seen about 24hours prior to that. Pt unable to recall the fall or events preceding it.The patient lives alone, but family lives close by and normally checks on her daily. However, this weekend home with the family were out of town since Friday. She had been diagnosed with a urinary tract infection last Monday and had been started on antibiotics. At her baseline she has chronic interstitial cystitis and normally complains of some dysuria. 3 days ago patient had went to urgent care for continued pain and was given a urinary Abx which made her urine blue ED Course: Upon admission to the emergency department patient was evaluated and seen to have WBC of 11.3, BUN 31, creatinine 1.4, and CPK 12,050.  Assessment & Plan: Rhabdomyolysis with acute kidney injury:  -baseline creatinine normal, was 1.4 on admission and CPK of 12,000, was likely -due to fall and being found down -continue IVF today, CK 7K today, will continue IVF for 24hours more -Pt eval  Encephalopathy/confusion -due to dementia/dehydration -resolved, mentation at baseline today -clinically no evidence of infectious process at this time, afebrile, WBC normal now -ammonia ok   Interstitial cystitis: Chronic - Continue Elmiron, mirabegron   Elevated transaminases: - due to rhabdo, monitor  L hip pain -post fall, check Xray -Pt eval if xray ok  Paroxysmal atrial fibrillation on chronic anticoagulation -CHADs2vasc score atleast 3 -Continue Xarelto  Essential hypertension - will need to restart Cozaar when creatinine normalizes  Dementia - Continue donepezil  DVT prophylaxis: on  Xarelto Code Status: Full Family Communication: daughter at bedside Disposition Plan: Home with HH vs SNF in 1-2days    Subjective: Feels ok, L hip pain  Objective: Vitals:   09/30/15 2245 10/01/15 0015 10/01/15 0100 10/01/15 0523  BP: (!) 129/47 123/71 (!) 108/38 (!) 116/41  Pulse:  88 75 69  Resp: 24 26 18 16   Temp:   98.1 F (36.7 C) 97.9 F (36.6 C)  TempSrc:   Oral Oral  SpO2: 98% 97% 97% 96%    Intake/Output Summary (Last 24 hours) at 10/01/15 1224 Last data filed at 10/01/15 0548  Gross per 24 hour  Intake             1750 ml  Output              150 ml  Net             1600 ml   There were no vitals filed for this visit.  Examination:  General exam: Appears calm and comfortable  Respiratory system: Clear to auscultation. Respiratory effort normal. Cardiovascular system: S1 & S2 heard, RRR. No JVD, murmurs, rubs, gallops or clicks. No pedal edema. Gastrointestinal system: Abdomen is nondistended, soft and nontender. No organomegaly or masses felt. Normal bowel sounds heard. Central nervous system: Alert and oriented. No focal neurological deficits. Extremities: Symmetric 5 x 5 power. Skin: No rashes, lesions or ulcers Psychiatry: Judgement and insight appear normal. Mood & affect appropriate.     Data Reviewed: I have personally reviewed following labs and imaging studies  CBC:  Recent Labs Lab 09/30/15 2056 10/01/15 0627  WBC 11.3* 8.5  HGB 14.0 12.0  HCT 41.8 37.0  MCV 91.1 92.5  PLT 202 190   Basic Metabolic Panel:  Recent Labs Lab 09/30/15 2056 10/01/15 0627  NA 137 139  K 4.1 4.3  CL 105 112*  CO2 16* 15*  GLUCOSE 72 68  BUN 31* 34*  CREATININE 1.40* 1.26*  CALCIUM 9.7 8.2*   GFR: CrCl cannot be calculated (Unknown ideal weight.). Liver Function Tests:  Recent Labs Lab 09/30/15 2056 10/01/15 0627  AST 234* 176*  ALT 65* 57*  ALKPHOS 42 38  BILITOT 1.5* 1.4*  PROT 6.6 5.7*  ALBUMIN 4.1 3.4*   No results for input(s):  LIPASE, AMYLASE in the last 168 hours.  Recent Labs Lab 10/01/15 0627  AMMONIA 23   Coagulation Profile: No results for input(s): INR, PROTIME in the last 168 hours. Cardiac Enzymes:  Recent Labs Lab 09/30/15 2056 10/01/15 0627  CKTOTAL 12,050* 7,134*   BNP (last 3 results) No results for input(s): PROBNP in the last 8760 hours. HbA1C: No results for input(s): HGBA1C in the last 72 hours. CBG:  Recent Labs Lab 09/30/15 2105  GLUCAP 73   Lipid Profile: No results for input(s): CHOL, HDL, LDLCALC, TRIG, CHOLHDL, LDLDIRECT in the last 72 hours. Thyroid Function Tests: No results for input(s): TSH, T4TOTAL, FREET4, T3FREE, THYROIDAB in the last 72 hours. Anemia Panel: No results for input(s): VITAMINB12, FOLATE, FERRITIN, TIBC, IRON, RETICCTPCT in the last 72 hours. Urine analysis:    Component Value Date/Time   COLORURINE GREEN (A) 09/30/2015 2020   APPEARANCEUR CLEAR 09/30/2015 2020   LABSPEC 1.015 09/30/2015 2020   PHURINE 6.5 09/30/2015 2020   GLUCOSEU NEGATIVE 09/30/2015 2020   HGBUR LARGE (A) 09/30/2015 2020   BILIRUBINUR NEGATIVE 09/30/2015 2020   KETONESUR 15 (A) 09/30/2015 2020   PROTEINUR NEGATIVE 09/30/2015 2020   UROBILINOGEN 0.2 09/03/2014 1649   NITRITE NEGATIVE 09/30/2015 2020   LEUKOCYTESUR NEGATIVE 09/30/2015 2020   Sepsis Labs: @LABRCNTIP (procalcitonin:4,lacticidven:4)  )No results found for this or any previous visit (from the past 240 hour(s)).       Radiology Studies: Dg Chest 2 View  Result Date: 09/30/2015 CLINICAL DATA:  Altered mental status, fell today.  Back pain. EXAM: CHEST  2 VIEW COMPARISON:  Chest radiograph July 16, 2013 FINDINGS: Cardiomediastinal silhouette is normal. Mild calcific atherosclerosis of the aortic knob. No pleural effusions or focal consolidations. Trachea projects midline and there is no pneumothorax. Soft tissue planes and included osseous structures are non-suspicious. IMPRESSION: No acute cardiopulmonary  process. Electronically Signed   By: Awilda Metro M.D.   On: 09/30/2015 22:27   Dg Hips Bilat With Pelvis 3-4 Views  Result Date: 10/01/2015 CLINICAL DATA:  Bilateral hip pain following possible unwitnessed fall at home yesterday EXAM: DG HIP (WITH OR WITHOUT PELVIS) 3-4V BILAT COMPARISON:  Coronal and sagittal CT images from a scan of December 19, 2010 FINDINGS: The bones are subjectively osteopenic. The pelvis exhibits no acute fracture. AP and lateral views of both hips reveal moderate symmetric joint space narrowing bilaterally. The articular surfaces of the femoral heads and acetabuli remains smoothly rounded. The femoral necks, intertrochanteric, and subtrochanteric regions appear normal. The overlying soft tissues exhibit no acute abnormalities. IMPRESSION: There is no acute bony abnormality of the pelvis or either hip. There is mild symmetric joint space narrowing of both hips consistent with osteoarthritis. Electronically Signed   By: David  [place] M.D.   On: 10/01/2015 10:26        Scheduled Meds: . cholecalciferol  1,000 Units Oral Daily  . donepezil  5 mg Oral QHS  . LORazepam  1 mg Oral QHS  . pantoprazole  40 mg Oral q morning - 10a  . pentosan polysulfate  200 mg Oral BID  . Rivaroxaban  15 mg Oral Q breakfast  . vitamin B-12  1,000 mcg Oral Daily   Continuous Infusions: . sodium chloride 100 mL/hr at 10/01/15 0913     LOS: 0 days    Time spent:    Zannie Cove, MD [facility] Pager [phone]  If 7PM-7AM, please contact night-coverage [email or web address] Password [facility] 10/01/2015, 12:24 PM

## 2015-10-01 NOTE — Progress Notes (Addendum)
Patient's blood pressure 116/41.  Paged MD.

## 2015-10-01 NOTE — Care Management Note (Addendum)
Case Management Note  Patient Details  Name: Christine [name] MRN: [id number] Date of Birth: 05-04-1933  Subjective/Objective:                 Spoke with patient and granddaughter in room. Patient has some dementia, lives alone, but family lives across driveway from her. They provide support and check in frequently. Daughter Donata Clay is primary support at home. Patient has cane at home, no other DME. Patient was found after falling, IVF for rhabdo. Pain to L hip, evaluating for Fx. PT eval pending. [facility] provider list given to patient's daughter Nevin Bloodgood. PCP [place], [facility]   Action/Plan:  Anticipate HH, and DME.   Patient and daughter would like to use [place] for [facility] PT. They declined other HH needs and DME. Referral made to [place] ([facility]).  Expected Discharge Date:                  Expected Discharge Plan:  [place]  In-House Referral:     Discharge planning Services  CM Consult  Post Acute Care Choice:    Choice offered to:     DME Arranged:    DME Agency:     HH Arranged:    [place] Agency:     Status of Service:  In process, will continue to follow  If discussed at Long Length of Stay Meetings, dates discussed:    Additional Comments:  Carles Collet, RN 10/01/2015, 11:49 AM

## 2015-10-01 NOTE — ED Notes (Signed)
Admitting at bedside 

## 2015-10-01 NOTE — Evaluation (Signed)
Physical Therapy Evaluation Patient Details Name: Christine [name] MRN: [id number] DOB: 02/18/1933 Today's Date: 10/01/2015   History of Present Illness  pt is a 82 y/o female with pmh of dementia, PAF, HTN, presents after being found down at home by family.  Pt found to be suffering from rhabdomyolysis with AKI and acute encephalopathy due to medication vs dehydration vs infection.  Clinical Impression  Pt admitted with/for unwitnessed fall with rhabdo and aki.  Pt currently limited functionally due to the problems listed below.  (see problems list.)  Pt will benefit from PT to maximize function and safety to be able to get home safely with available assist of daughter and family.     Follow Up Recommendations Home health PT;Supervision for mobility/OOB    Equipment Recommendations   (riser with handles or 3 in 1 dependent on space allowance)    Recommendations for Other Services       Precautions / Restrictions Precautions Precautions: Fall      Mobility  Bed Mobility Overal bed mobility: Needs Assistance Bed Mobility: Supine to Sit     Supine to sit: Min assist     General bed mobility comments: cues and assist to help pt bridge and sequence to EOB  Transfers Overall transfer level: Needs assistance   Transfers: Sit to/from Stand Sit to Stand: Min assist;Min guard (min initially and min guard second stand with more confidenc)         General transfer comment: assist to come forward and up  Ambulation/Gait Ambulation/Gait assistance: Min guard Ambulation Distance (Feet): 180 Feet Assistive device: Rolling walker (2 wheeled) Gait Pattern/deviations: Step-through pattern Gait velocity: moderate Gait velocity interpretation: at or above normal speed for age/gender General Gait Details: generally steady and improving as she "loosened" up  Stairs            Wheelchair Mobility    Modified Rankin (Stroke Patients Only)       Balance Overall balance  assessment: Needs assistance   Sitting balance-Leahy Scale: Fair Sitting balance - Comments: still guarded with soreness     Standing balance-Leahy Scale: Fair Standing balance comment: more confident with RW on eval                             Pertinent Vitals/Pain Pain Assessment: Faces Faces Pain Scale: Hurts little more Pain Location: soreness all over Pain Descriptors / Indicators: Sore;Guarding Pain Intervention(s): Monitored during session;Repositioned    Home Living Family/patient expects to be discharged to:: Private residence Living Arrangements: Alone Available Help at Discharge: Family;Available PRN/intermittently (daughter live on property just behind her) Type of Home: Mobile home Home Access: Stairs to enter Entrance Stairs-Rails: Right;Left Entrance Stairs-Number of Steps: 4 Home Layout: One level Home Equipment: Walker - 2 wheels;Cane - single point      Prior Function Level of Independence: Independent         Comments: drives and manages her home     Hand Dominance        Extremity/Trunk Assessment   Upper Extremity Assessment: Generalized weakness           Lower Extremity Assessment: Generalized weakness (guarded and painful)         Communication   Communication: No difficulties  Cognition Arousal/Alertness: Awake/alert Behavior During Therapy: WFL for tasks assessed/performed Overall Cognitive Status: No family/caregiver present to determine baseline cognitive functioning  General Comments      Exercises     Assessment/Plan    PT Assessment Patient needs continued PT services  PT Problem List Decreased strength;Decreased activity tolerance;Decreased balance;Decreased mobility;Pain          PT Treatment Interventions DME instruction;Gait training;Functional mobility training;Stair training;Therapeutic activities;Patient/family education    PT Goals (Current goals can be found  in the Care Plan section)  Acute Rehab PT Goals Patient Stated Goal: back home and able to be by myself PT Goal Formulation: With patient Time For Goal Achievement: 10/08/15 Potential to Achieve Goals: Good    Frequency Min 3X/week   Barriers to discharge        Co-evaluation               End of Session   Activity Tolerance: Patient tolerated treatment well Patient left: in chair;with call bell/phone within reach;with chair alarm set Nurse Communication: Mobility status         Time: 1610-9604 PT Time Calculation (min) (ACUTE ONLY): 29 min   Charges:   PT Evaluation $PT Eval Low Complexity: 1 Procedure PT Treatments $Gait Training: 8-22 mins   PT G Codes:        Hudsen Fei, Eliseo Gum 10/01/2015, 5:33 PM 10/01/2015  [place] Bing, PT [phone]  (pager)

## 2015-10-01 NOTE — ED Notes (Signed)
Pt/family updated on bed assignment

## 2015-10-02 DIAGNOSIS — G9340 Encephalopathy, unspecified: Secondary | ICD-10-CM

## 2015-10-02 DIAGNOSIS — N179 Acute kidney failure, unspecified: Secondary | ICD-10-CM

## 2015-10-02 DIAGNOSIS — T796XXA Traumatic ischemia of muscle, initial encounter: Secondary | ICD-10-CM

## 2015-10-02 DIAGNOSIS — I10 Essential (primary) hypertension: Secondary | ICD-10-CM

## 2015-10-02 LAB — BASIC METABOLIC PANEL
Anion gap: 7 (ref 5–15)
BUN: 24 mg/dL — ABNORMAL HIGH (ref 6–20)
CALCIUM: 7.9 mg/dL — AB (ref 8.9–10.3)
CO2: 22 mmol/L (ref 22–32)
CREATININE: 0.86 mg/dL (ref 0.44–1.00)
Chloride: 110 mmol/L (ref 101–111)
GFR calc non Af Amer: >60 mL/min (ref >60)
Glucose, Bld: 99 mg/dL (ref 65–99)
Potassium: 4 mmol/L (ref 3.5–5.1)
Sodium: 139 mmol/L (ref 135–145)

## 2015-10-02 LAB — CK
CK TOTAL: 3245 U/L — AB (ref 38–234)
CK TOTAL: 3131 U/L — AB (ref 38–234)

## 2015-10-02 NOTE — Discharge Summary (Signed)
Physician Discharge Summary  Christine [name] ZOX:[id number] DOB: 02/13/1933 DOA: 09/30/2015  PCP: Terriyah Westra Kayser, MD  Admit date: 09/30/2015 Discharge date: 10/02/2015  Time spent: 35 minutes  Recommendations for Outpatient Follow-up:  1. Please follow up on CPK in 2 days, she presented with rhabdomyolysis treated with IV fluids with CPK coming down from 12,050 to 7,134 to 3,131 on day of discharge 2. BMP on hospital follow up as she was found to have AKI, Cr trending down from 1.4 on admission to 0.86 on day of discharge 3. Prior to discharge she was set up with Home Health services   Discharge Diagnoses:  Principal Problem:   Rhabdomyolysis Active Problems:   PAF (paroxysmal atrial fibrillation) (HCC)   Acute kidney injury (HCC)   Acute encephalopathy   Interstitial cystitis   Essential hypertension   Discharge Condition: Stable/Improved  Diet recommendation: Regular  There were no vitals filed for this visit.  History of present illness:   Christine [name] is a 82 y.o. female with medical history significant of chronic interstitial cystitis, carotid stenosis, dementia, and arthritis; who presents after being found down at home by family.  History is obtained from the patient daughter, as the patient is currently confused and unable to give adequate history at this time. The patient lives alone, but family lives close by and normally checks on her daily. However, this weekend home with the family were out of town since Friday. Her daughter notes that she called routinely and had last spoke with her mother yesterday afternoon around 3 or 4 PM. At that time she noted that her mother had complained of back pain and seemed more confused than usual. She had been diagnosed with a urinary tract infection last Monday and had been started on antibiotics. At her baseline she has chronic interstitial cystitis and normally complains of some dysuria. 3 days ago patient had went to urgent care for  continued pain and was given some medication that family is unsure of at this time. Family notes that today her urine was a dark blue/green color that it's never been previously before to their knowledge.   Hospital Course:  Christine [name] is a pleasant 82 year old female, independent on her ADL's, residing in the community admitted to the medicine service on 09/30/2015. She had been found down by family. She was recently diagnosed with UTI and had been treated with antimicrobial therapy in the outpatient setting. She was found to be in rhabdomyolysis having a CPK of 12,050 along with AKI with Cr of 1.4. Labs did not show evidence of infection. WBC's were within normal limits, u/a did not show nitrates of leukocytes. CXR was unremarkable. She showed clinical improvement with IV fluids.   Rhabdomyolysiswith acute kidney injury:  -baseline creatinine normal, was 1.4 on admission and CPK of 12,000, was likely -due to fall and being found down -On 10/02/2015 CPK trending down to 3,131 and Cr trending down to 0.86 after receiving IV fluids. On this date she expressed her wishes to be discharged and follow up with her PCP   Encephalopathy/confusion -due to dementia/dehydration -resolved, mentation at baseline today -clinically no evidence of infectious process at this time, afebrile, WBC normal now -ammonia ok  Interstitial cystitis: Chronic - Continue Elmiron, mirabegron  Elevated transaminases: - due to rhabdo, monitor  L hip pain -post fall, check Xray -Pt eval if xray ok  Paroxysmal atrial fibrillation on chronic anticoagulation -CHADs2vasc score atleast 3 -Continue Xarelto  Essential hypertension - Restarted Cozaar  on discharge   Discharge Exam: Vitals:   10/02/15 0800 10/02/15 1542  BP: (!) 133/57 (!) 127/58  Pulse: 80 71  Resp: 16 17  Temp: 97.7 F (36.5 C) 98.4 F (36.9 C)    General exam: Appears calm and comfortable  Respiratory system: Clear to auscultation.  Respiratory effort normal. Cardiovascular system: S1 & S2 heard, RRR. No JVD, murmurs, rubs, gallops or clicks. No pedal edema. Gastrointestinal system: Abdomen is nondistended, soft and nontender. No organomegaly or masses felt. Normal bowel sounds heard. Central nervous system: Alert and oriented. No focal neurological deficits. Extremities: Symmetric 5 x 5 power. Skin: No rashes, lesions or ulcers Psychiatry: Judgement and insight appear normal. Mood & affect appropriate.   Discharge Instructions   Discharge Instructions    Call MD for:    Complete by:  As directed    Call MD for:  difficulty breathing, headache or visual disturbances    Complete by:  As directed    Call MD for:  extreme fatigue    Complete by:  As directed    Call MD for:  hives    Complete by:  As directed    Call MD for:  persistant dizziness or light-headedness    Complete by:  As directed    Call MD for:  persistant nausea and vomiting    Complete by:  As directed    Call MD for:  redness, tenderness, or signs of infection (pain, swelling, redness, odor or green/yellow discharge around incision site)    Complete by:  As directed    Call MD for:  severe uncontrolled pain    Complete by:  As directed    Call MD for:  temperature >100.4    Complete by:  As directed    Diet - low sodium heart healthy    Complete by:  As directed    Increase activity slowly    Complete by:  As directed      Current Discharge Medication List    CONTINUE these medications which have NOT CHANGED   Details  Cholecalciferol (VITAMIN D3) 1000 UNITS CAPS Take 1,000 Units by mouth daily.     donepezil (ARICEPT) 5 MG tablet Take 5 mg by mouth at bedtime.    hydrOXYzine (ATARAX/VISTARIL) 25 MG tablet Take 25-50 mg by mouth at bedtime.  Refills: 11    loratadine (CLARITIN) 10 MG tablet Take 10 mg by mouth daily.    LORazepam (ATIVAN) 1 MG tablet Take 1-2 mg by mouth at bedtime.     losartan (COZAAR) 50 MG tablet Take 50 mg  by mouth every morning.     mirabegron ER (MYRBETRIQ) 50 MG TB24 tablet Take 50 mg by mouth daily.    pantoprazole (PROTONIX) 40 MG tablet Take 40 mg by mouth every morning.     pentosan polysulfate (ELMIRON) 100 MG capsule Take 200 mg by mouth 2 (two) times daily.    phenazopyridine (PYRIDIUM) 100 MG tablet Take 100 mg by mouth 3 (three) times daily as needed. bladder    polyethylene glycol (MIRALAX / GLYCOLAX) packet Take 17 g by mouth daily.     Rivaroxaban (XARELTO) 15 MG TABS tablet Take 1 tablet (15 mg total) by mouth daily with supper. Qty: 30 tablet, Refills: 0    traMADol (ULTRAM) 50 MG tablet Take 50 mg by mouth 2 (two) times daily as needed for moderate pain.     vitamin B-12 (CYANOCOBALAMIN) 1000 MCG tablet Take 1,000 mcg by mouth daily.  Allergies  Allergen Reactions  . Gabapentin Other (See Comments)    UNKNOWN   Follow-up Information    [facility] .   Why:  for home health physical therapy they will contact you in 1-2 days to set up first home visit Contact information: [address] [place] [address] [phone]            The results of significant diagnostics from this hospitalization (including imaging, microbiology, ancillary and laboratory) are listed below for reference.    Significant Diagnostic Studies: Dg Chest 2 View  Result Date: 09/30/2015 CLINICAL DATA:  Altered mental status, fell today.  Back pain. EXAM: CHEST  2 VIEW COMPARISON:  Chest radiograph July 16, 2013 FINDINGS: Cardiomediastinal silhouette is normal. Mild calcific atherosclerosis of the aortic knob. No pleural effusions or focal consolidations. Trachea projects midline and there is no pneumothorax. Soft tissue planes and included osseous structures are non-suspicious. IMPRESSION: No acute cardiopulmonary process. Electronically Signed   By: Awilda Metro M.D.   On: 09/30/2015 22:27   Dg Hips Bilat With Pelvis 3-4 Views  Result Date:  10/01/2015 CLINICAL DATA:  Bilateral hip pain following possible unwitnessed fall at home yesterday EXAM: DG HIP (WITH OR WITHOUT PELVIS) 3-4V BILAT COMPARISON:  Coronal and sagittal CT images from a scan of December 19, 2010 FINDINGS: The bones are subjectively osteopenic. The pelvis exhibits no acute fracture. AP and lateral views of both hips reveal moderate symmetric joint space narrowing bilaterally. The articular surfaces of the femoral heads and acetabuli remains smoothly rounded. The femoral necks, intertrochanteric, and subtrochanteric regions appear normal. The overlying soft tissues exhibit no acute abnormalities. IMPRESSION: There is no acute bony abnormality of the pelvis or either hip. There is mild symmetric joint space narrowing of both hips consistent with osteoarthritis. Electronically Signed   By: David  [place] M.D.   On: 10/01/2015 10:26    Microbiology: No results found for this or any previous visit (from the past 240 hour(s)).   Labs: Basic Metabolic Panel:  Recent Labs Lab 09/30/15 2056 10/01/15 0627 10/02/15 0616  NA 137 139 139  K 4.1 4.3 4.0  CL 105 112* 110  CO2 16* 15* 22  GLUCOSE 72 68 99  BUN 31* 34* 24*  CREATININE 1.40* 1.26* 0.86  CALCIUM 9.7 8.2* 7.9*   Liver Function Tests:  Recent Labs Lab 09/30/15 2056 10/01/15 0627  AST 234* 176*  ALT 65* 57*  ALKPHOS 42 38  BILITOT 1.5* 1.4*  PROT 6.6 5.7*  ALBUMIN 4.1 3.4*   No results for input(s): LIPASE, AMYLASE in the last 168 hours.  Recent Labs Lab 10/01/15 0627  AMMONIA 23   CBC:  Recent Labs Lab 09/30/15 2056 10/01/15 0627  WBC 11.3* 8.5  HGB 14.0 12.0  HCT 41.8 37.0  MCV 91.1 92.5  PLT 202 190   Cardiac Enzymes:  Recent Labs Lab 09/30/15 2056 10/01/15 0627 10/02/15 0616 10/02/15 1435  CKTOTAL 12,050* 7,134* 3,245* 3,131*   BNP: BNP (last 3 results) No results for input(s): BNP in the last 8760 hours.  ProBNP (last 3 results) No results for input(s): PROBNP in the  last 8760 hours.  CBG:  Recent Labs Lab 09/30/15 2105  GLUCAP 73       Signed:  Jeralyn Bennett MD.  [facility] 10/02/2015, 4:26 PM

## 2015-10-02 NOTE — Progress Notes (Signed)
Physical Therapy Treatment Patient Details Name: Christine [name] MRN: [id number] DOB: 01-01-1934 Today's Date: 10/02/2015    History of Present Illness pt is a 82 y/o female with pmh of dementia, PAF, HTN, presents after being found down at home by family.  Pt found to be suffering frome fhabdomyolysis with AKI and acute encephalopathy due to medication vs dehydration vs infection.    PT Comments    Progressing well, steady gait.  Pt and daughter educated on gentle progression of mobility. Would benefit from short-term HHPT.   Follow Up Recommendations  Home health PT;Supervision for mobility/OOB     Equipment Recommendations  None recommended by PT    Recommendations for Other Services       Precautions / Restrictions Precautions Precautions: Fall    Mobility  Bed Mobility Overal bed mobility: Needs Assistance Bed Mobility: Supine to Sit;Sit to Supine     Supine to sit: Modified independent (Device/Increase time) Sit to supine: Modified independent (Device/Increase time)      Transfers Overall transfer level: Needs assistance   Transfers: Sit to/from Stand Sit to Stand: Supervision            Ambulation/Gait Ambulation/Gait assistance: Supervision Ambulation Distance (Feet): 200 Feet   Gait Pattern/deviations: Step-through pattern Gait velocity: moderate Gait velocity interpretation: at or above normal speed for age/gender General Gait Details: generally steady, stride improving with distance   Stairs Stairs: Yes Stairs assistance: Supervision Stair Management: One rail Right;Step to pattern;Forwards Number of Stairs: 5 General stair comments: difficulty getting each foot up to next higher step, but safe with rail and supervision  Wheelchair Mobility    Modified Rankin (Stroke Patients Only)       Balance Overall balance assessment: Needs assistance   Sitting balance-Leahy Scale: Good       Standing balance-Leahy Scale: Fair                       Cognition Arousal/Alertness: Awake/alert Behavior During Therapy: WFL for tasks assessed/performed Overall Cognitive Status: History of cognitive impairments - at baseline                      Exercises      General Comments        Pertinent Vitals/Pain Pain Assessment: Faces Faces Pain Scale: Hurts a little bit Pain Location: soreness all over Pain Descriptors / Indicators: Sore Pain Intervention(s): Monitored during session;Repositioned    Home Living                      Prior Function            PT Goals (current goals can now be found in the care plan section) Acute Rehab PT Goals PT Goal Formulation: With patient Time For Goal Achievement: 10/08/15 Potential to Achieve Goals: Good Progress towards PT goals: Progressing toward goals    Frequency    Min 3X/week      PT Plan Current plan remains appropriate    Co-evaluation             End of Session   Activity Tolerance: Patient tolerated treatment well Patient left: in bed;Other (comment);with call bell/phone within reach;with family/visitor present     Time: 1550-1615 PT Time Calculation (min) (ACUTE ONLY): 25 min  Charges:  $Gait Training: 8-22 mins $Therapeutic Activity: 8-22 mins                    G  Codes:      Keshon Markovitz, Eliseo Gum 10/02/2015, 4:25 PM  10/02/2015  [place] Bing, PT [phone]  (pager)

## 2015-10-04 DIAGNOSIS — M6282 Rhabdomyolysis: Secondary | ICD-10-CM | POA: Diagnosis not present

## 2015-10-07 DIAGNOSIS — M859 Disorder of bone density and structure, unspecified: Secondary | ICD-10-CM | POA: Diagnosis not present

## 2015-10-07 DIAGNOSIS — F0390 Unspecified dementia without behavioral disturbance: Secondary | ICD-10-CM | POA: Diagnosis not present

## 2015-10-07 DIAGNOSIS — M8580 Other specified disorders of bone density and structure, unspecified site: Secondary | ICD-10-CM | POA: Diagnosis not present

## 2015-10-07 DIAGNOSIS — I10 Essential (primary) hypertension: Secondary | ICD-10-CM | POA: Diagnosis not present

## 2015-10-07 DIAGNOSIS — I480 Paroxysmal atrial fibrillation: Secondary | ICD-10-CM | POA: Diagnosis not present

## 2015-10-07 DIAGNOSIS — M1990 Unspecified osteoarthritis, unspecified site: Secondary | ICD-10-CM | POA: Diagnosis not present

## 2015-10-07 DIAGNOSIS — N3010 Interstitial cystitis (chronic) without hematuria: Secondary | ICD-10-CM | POA: Diagnosis not present

## 2015-10-31 DIAGNOSIS — D6489 Other specified anemias: Secondary | ICD-10-CM | POA: Diagnosis not present

## 2015-10-31 DIAGNOSIS — N3010 Interstitial cystitis (chronic) without hematuria: Secondary | ICD-10-CM | POA: Diagnosis not present

## 2015-10-31 DIAGNOSIS — Z9181 History of falling: Secondary | ICD-10-CM | POA: Diagnosis not present

## 2015-10-31 DIAGNOSIS — I480 Paroxysmal atrial fibrillation: Secondary | ICD-10-CM | POA: Diagnosis not present

## 2015-10-31 DIAGNOSIS — G894 Chronic pain syndrome: Secondary | ICD-10-CM | POA: Diagnosis not present

## 2015-10-31 DIAGNOSIS — I10 Essential (primary) hypertension: Secondary | ICD-10-CM | POA: Diagnosis not present

## 2015-10-31 DIAGNOSIS — M6282 Rhabdomyolysis: Secondary | ICD-10-CM | POA: Diagnosis not present

## 2015-11-01 DIAGNOSIS — M6282 Rhabdomyolysis: Secondary | ICD-10-CM | POA: Diagnosis not present

## 2015-12-04 DIAGNOSIS — R300 Dysuria: Secondary | ICD-10-CM | POA: Diagnosis not present

## 2015-12-17 DIAGNOSIS — R102 Pelvic and perineal pain: Secondary | ICD-10-CM | POA: Diagnosis not present

## 2015-12-17 DIAGNOSIS — G4701 Insomnia due to medical condition: Secondary | ICD-10-CM | POA: Diagnosis not present

## 2015-12-17 DIAGNOSIS — R413 Other amnesia: Secondary | ICD-10-CM | POA: Insufficient documentation

## 2015-12-17 DIAGNOSIS — N3010 Interstitial cystitis (chronic) without hematuria: Secondary | ICD-10-CM | POA: Diagnosis not present

## 2015-12-17 DIAGNOSIS — G4700 Insomnia, unspecified: Secondary | ICD-10-CM | POA: Insufficient documentation

## 2015-12-21 ENCOUNTER — Encounter (HOSPITAL_COMMUNITY): Payer: Self-pay | Admitting: Emergency Medicine

## 2015-12-21 ENCOUNTER — Other Ambulatory Visit: Payer: Self-pay

## 2015-12-21 ENCOUNTER — Emergency Department (HOSPITAL_COMMUNITY)
Admission: EM | Admit: 2015-12-21 | Discharge: 2015-12-21 | Disposition: A | Discharge status: Home / Self Care | Payer: Medicare Other | Attending: Emergency Medicine | Admitting: Emergency Medicine

## 2015-12-21 ENCOUNTER — Emergency Department (HOSPITAL_COMMUNITY): Payer: Medicare Other

## 2015-12-21 DIAGNOSIS — I10 Essential (primary) hypertension: Secondary | ICD-10-CM | POA: Diagnosis not present

## 2015-12-21 DIAGNOSIS — Z79899 Other long term (current) drug therapy: Secondary | ICD-10-CM | POA: Insufficient documentation

## 2015-12-21 DIAGNOSIS — R270 Ataxia, unspecified: Secondary | ICD-10-CM | POA: Diagnosis not present

## 2015-12-21 DIAGNOSIS — Z7901 Long term (current) use of anticoagulants: Secondary | ICD-10-CM | POA: Insufficient documentation

## 2015-12-21 DIAGNOSIS — Z8673 Personal history of transient ischemic attack (TIA), and cerebral infarction without residual deficits: Secondary | ICD-10-CM | POA: Insufficient documentation

## 2015-12-21 DIAGNOSIS — R202 Paresthesia of skin: Secondary | ICD-10-CM | POA: Diagnosis present

## 2015-12-21 DIAGNOSIS — S0990XA Unspecified injury of head, initial encounter: Secondary | ICD-10-CM | POA: Diagnosis not present

## 2015-12-21 LAB — URINALYSIS, ROUTINE W REFLEX MICROSCOPIC
Bacteria, UA: NONE SEEN
Bilirubin Urine: NEGATIVE
Glucose, UA: NEGATIVE mg/dL
Hgb urine dipstick: NEGATIVE
Ketones, ur: 5 mg/dL — AB
Leukocytes, UA: NEGATIVE
Nitrite: NEGATIVE
PH: 5 (ref 5.0–8.0)
Protein, ur: 30 mg/dL — AB
RBC / HPF: NONE SEEN RBC/hpf (ref 0–5)
SPECIFIC GRAVITY, URINE: 1.026 (ref 1.005–1.030)

## 2015-12-21 LAB — DIFFERENTIAL
BASOS PCT: 1 %
Basophils Absolute: 0.1 10*3/uL (ref 0.0–0.1)
EOS PCT: 1 %
Eosinophils Absolute: 0 10*3/uL (ref 0.0–0.7)
Lymphocytes Relative: 20 %
Lymphs Abs: 1.2 10*3/uL (ref 0.7–4.0)
MONO ABS: 0.5 10*3/uL (ref 0.1–1.0)
MONOS PCT: 9 %
Neutro Abs: 4.3 10*3/uL (ref 1.7–7.7)
Neutrophils Relative %: 69 %

## 2015-12-21 LAB — COMPREHENSIVE METABOLIC PANEL
ALK PHOS: 46 U/L (ref 38–126)
ALT: 20 U/L (ref 14–54)
ANION GAP: 8 (ref 5–15)
AST: 30 U/L (ref 15–41)
Albumin: 4.3 g/dL (ref 3.5–5.0)
BUN: 20 mg/dL (ref 6–20)
CALCIUM: 9.3 mg/dL (ref 8.9–10.3)
CHLORIDE: 106 mmol/L (ref 101–111)
CO2: 27 mmol/L (ref 22–32)
CREATININE: 1.21 mg/dL — AB (ref 0.44–1.00)
GFR, EST AFRICAN AMERICAN: 48 mL/min — AB (ref >60)
GFR, EST NON AFRICAN AMERICAN: 41 mL/min — AB (ref >60)
Glucose, Bld: 87 mg/dL (ref 65–99)
Potassium: 3.8 mmol/L (ref 3.5–5.1)
SODIUM: 141 mmol/L (ref 135–145)
Total Bilirubin: 0.9 mg/dL (ref 0.3–1.2)
Total Protein: 7.2 g/dL (ref 6.5–8.1)

## 2015-12-21 LAB — PROTIME-INR
INR: 1.64
PROTHROMBIN TIME: 19.6 s — AB (ref 11.4–15.2)

## 2015-12-21 LAB — I-STAT TROPONIN, ED: Troponin i, poc: 0.01 ng/mL (ref 0.00–0.08)

## 2015-12-21 LAB — I-STAT CHEM 8, ED
BUN: 29 mg/dL — ABNORMAL HIGH (ref 6–20)
CALCIUM ION: 1.12 mmol/L — AB (ref 1.15–1.40)
CREATININE: 1.1 mg/dL — AB (ref 0.44–1.00)
Chloride: 105 mmol/L (ref 101–111)
GLUCOSE: 83 mg/dL (ref 65–99)
HCT: 41 % (ref 36.0–46.0)
HEMOGLOBIN: 13.9 g/dL (ref 12.0–15.0)
Potassium: 4.2 mmol/L (ref 3.5–5.1)
Sodium: 141 mmol/L (ref 135–145)
TCO2: 27 mmol/L (ref 0–100)

## 2015-12-21 LAB — CBC
HEMATOCRIT: 40.5 % (ref 36.0–46.0)
Hemoglobin: 13.6 g/dL (ref 12.0–15.0)
MCH: 30.4 pg (ref 26.0–34.0)
MCHC: 33.6 g/dL (ref 30.0–36.0)
MCV: 90.6 fL (ref 78.0–100.0)
PLATELETS: 242 10*3/uL (ref 150–400)
RBC: 4.47 MIL/uL (ref 3.87–5.11)
RDW: 14.1 % (ref 11.5–15.5)
WBC: 6.2 10*3/uL (ref 4.0–10.5)

## 2015-12-21 LAB — APTT: aPTT: 40 seconds — ABNORMAL HIGH (ref 24–36)

## 2015-12-21 NOTE — ED Notes (Signed)
ED Provider at bedside. Campos 

## 2015-12-21 NOTE — ED Provider Notes (Signed)
[facility] DEPT Provider Note   CSN: [id number] Arrival date & time: 12/21/15  1129     History   Chief Complaint Chief Complaint  Patient presents with  . Tingling    ambuating to the right,,    HPI Christine [name] is a 82 y.o. female.  HPI Patient presents the emergency department after waking this morning with more difficulty walking secondary to coordination.  She feels like she was falling towards the right.  No prior history of stroke.  This continued through the day and finally the members of her church contacted her daughter who recommended that she come to the ER.  Daughter reports no change in her speech.  Patient denies weakness isolated to her arms or legs.  She reports difficulty with ambulation.  No recent illness.  No fevers or chills.   Past Medical History:  Diagnosis Date  . AR (allergic rhinitis)   . Arthritis   . Bilateral carotid artery stenosis    MILD --  40% BILATERAL ICA PER DOPPLER JULY 2014  . Diverticulosis   . Frequency of urination   . GERD (gastroesophageal reflux disease)   . History of colon polyps    ADENOMATOUS  . History of Helicobacter pylori infection   . History of shingles   . History of TIA (transient ischemic attack)   . Hyperlipidemia   . Hypertension   . Impaired memory   . Interstitial cystitis   . Iron deficiency   . Nocturia   . Osteopenia   . PAF (paroxysmal atrial fibrillation) ([place])    dx 07/ 2015  . Pelvic pain in female   . Urgency of urination   . Vitamin D deficiency   . Wears glasses     Patient Active Problem List   Diagnosis Date Noted  . Acute kidney injury ([place]) 10/01/2015  . Acute encephalopathy 10/01/2015  . Interstitial cystitis 10/01/2015  . Essential hypertension 10/01/2015  . Rhabdomyolysis 09/30/2015  . UTI (lower urinary tract infection) 07/16/2013  . PAF (paroxysmal atrial fibrillation) ([place]) 07/16/2013  . Chest pain 07/16/2013  . ABDOMINAL PAIN, UNSPECIFIED SITE 02/03/2010  .  DYSPEPSIA 01/18/2008  . ADENOMATOUS COLONIC POLYP 01/17/2008  . HEMORRHOIDS, INTERNAL 01/17/2008  . ESOPHAGITIS 01/17/2008  . DIVERTICULOSIS, COLON 01/17/2008  . HEMORRHOIDS-EXTERNAL 12/07/2007  . GERD 12/07/2007  . CONSTIPATION 12/07/2007  . RECTAL BLEEDING 12/07/2007  . Loss of weight 12/07/2007  . Nausea alone 12/07/2007  . HEARTBURN 12/07/2007  . ABDOMINAL BLOATING 12/07/2007  . PERSONAL HX COLONIC POLYPS 12/07/2007    Past Surgical History:  Procedure Laterality Date  . ABDOMINAL HYSTERECTOMY    . CATARACT EXTRACTION W/ INTRAOCULAR LENS  IMPLANT, BILATERAL  2009  . CYSTO WITH HYDRODISTENSION  02/24/2011   Procedure: CYSTOSCOPY/HYDRODISTENSION;  Surgeon: Reece Packer, MD;  Location[facility];  Service: Urology;  Laterality: N/A;  INSTILLATION OF marcaine and  pyridium  . CYSTO WITH HYDRODISTENSION N/A 06/20/2013   Procedure: CYSTOSCOPY/HYDRODISTENSION/INSTALLATION OF PYRIDIUM -MARCAINE 0.5%.;  Surgeon: Reece Packer, MD;  Location: [facility];  Service: Urology;  Laterality: N/A;  . CYSTO WITH HYDRODISTENSION N/A 03/22/2014   Procedure: CYSTOSCOPY/HYDRODISTENSION INSTILLATION OF MARCAINE AND PYRDIUM ;  Surgeon: Bjorn Loser, MD;  Location: [facility];  Service: Urology;  Laterality: N/A;  . CYSTO WITH HYDRODISTENSION N/A 09/17/2014   Procedure: CYSTOSCOPY HYDRODISTENSION INSTILLATION OF MARCAINE AND PYRIDIUM;  Surgeon: Bjorn Loser, MD;  Location: [facility];  Service: Urology;  Laterality: N/A;  .  CYSTO/ HOD/ INSTILLATION THERAPY  10-04-2008  &  05-02-2009  . CYSTO/ LEFT URETEROSCOPY W/ LEFT URETERAL DILATION  04-15-2006  . TRANSTHORACIC ECHOCARDIOGRAM  07-17-2013   grade I diastolic dysfuntion/  ef [id number]  mild AR and MR/  trivial TR  . URETEROLITHOTOMY Bilateral 11-24-2004 LEFT  &  05-29-2006  RIGHT  . VAULT SUSPENSION WITH GRAFT/ CYSTOCELE REPAIR  09-17-2009    OB History    No data  available       Home Medications    Prior to Admission medications   Medication Sig Start Date End Date Taking? Authorizing Provider  alendronate (FOSAMAX) 70 MG tablet Take 1 tablet by mouth every 7 (seven) days. 10/20/15  Yes Historical Provider, MD  Cholecalciferol (VITAMIN D3) 1000 UNITS CAPS Take 1,000 Units by mouth daily.    Yes Historical Provider, MD  donepezil (ARICEPT) 5 MG tablet Take 5 mg by mouth at bedtime. 09/12/15  Yes Historical Provider, MD  hydrOXYzine (ATARAX/VISTARIL) 25 MG tablet Take 25-50 mg by mouth at bedtime.  08/27/15  Yes Historical Provider, MD  LORazepam (ATIVAN) 1 MG tablet Take 1-2 mg by mouth at bedtime.  08/27/15  Yes Historical Provider, MD  losartan (COZAAR) 50 MG tablet Take 50 mg by mouth every morning.    Yes Historical Provider, MD  mirabegron ER (MYRBETRIQ) 50 MG TB24 tablet Take 50 mg by mouth daily. 12/25/14  Yes Historical Provider, MD  naproxen sodium (ALEVE) 220 MG tablet Take 220 mg by mouth daily as needed.   Yes Historical Provider, MD  pantoprazole (PROTONIX) 40 MG tablet Take 40 mg by mouth every morning.    Yes Historical Provider, MD  pentosan polysulfate (ELMIRON) 100 MG capsule Take 200 mg by mouth 2 (two) times daily. 08/27/15  Yes Historical Provider, MD  phenazopyridine (PYRIDIUM) 100 MG tablet Take 100 mg by mouth 3 (three) times daily as needed. bladder   Yes Historical Provider, MD  polyethylene glycol (MIRALAX / GLYCOLAX) packet Take 17 g by mouth daily.    Yes Historical Provider, MD  Rivaroxaban (XARELTO) 15 MG TABS tablet Take 1 tablet (15 mg total) by mouth daily with supper. Patient taking differently: Take 15 mg by mouth every morning.  07/17/13  Yes Kinnie Feil, MD  traMADol (ULTRAM) 50 MG tablet Take 50 mg by mouth 2 (two) times daily as needed for moderate pain.    Yes Historical Provider, MD  vitamin B-12 (CYANOCOBALAMIN) 1000 MCG tablet Take 1,000 mcg by mouth daily.   Yes Historical Provider, MD  loratadine (CLARITIN)  10 MG tablet Take 10 mg by mouth daily.    Historical Provider, MD    Family History No family history on file.  Social History Social History  Substance Use Topics  . Smoking status: Never Smoker  . Smokeless tobacco: Never Used  . Alcohol use No     Allergies   Gabapentin   Review of Systems Review of Systems  All other systems reviewed and are negative.    Physical Exam Updated Vital Signs BP 127/80   Pulse 83   Temp 97.7 F (36.5 C)   Resp 19   SpO2 97%   Physical Exam  Constitutional: She is oriented to person, place, and time. She appears well-developed and well-nourished. No distress.  HENT:  Head: Normocephalic and atraumatic.  Eyes: EOM are normal.  Neck: Normal range of motion.  Cardiovascular: Normal rate, regular rhythm and normal heart sounds.   Pulmonary/Chest: Effort normal and breath sounds normal.  Abdominal: Soft. She exhibits no distension. There is no tenderness.  Musculoskeletal: Normal range of motion.  Neurological: She is alert and oriented to person, place, and time.  Abnormal finger to nose bilaterally.  5 out of 5 strength of bilateral lower and upper extremity major muscle groups.  Unable to tandem walk.  Ataxic gait  Skin: Skin is warm and dry.  Psychiatric: She has a normal mood and affect. Judgment normal.  Nursing note and vitals reviewed.    ED Treatments / Results  Labs (all labs ordered are listed, but only abnormal results are displayed) Labs Reviewed  PROTIME-INR - Abnormal; Notable for the following:       Result Value   Prothrombin Time 19.6 (*)    All other components within normal limits  APTT - Abnormal; Notable for the following:    aPTT 40 (*)    All other components within normal limits  COMPREHENSIVE METABOLIC PANEL - Abnormal; Notable for the following:    Creatinine, Ser 1.21 (*)    GFR calc non Af Amer 41 (*)    GFR calc Af Amer 48 (*)    All other components within normal limits  URINALYSIS, ROUTINE  W REFLEX MICROSCOPIC - Abnormal; Notable for the following:    Color, Urine AMBER (*)    APPearance CLOUDY (*)    Ketones, ur 5 (*)    Protein, ur 30 (*)    Squamous Epithelial / LPF 6-30 (*)    All other components within normal limits  I-STAT CHEM 8, ED - Abnormal; Notable for the following:    BUN 29 (*)    Creatinine, Ser 1.10 (*)    Calcium, Ion 1.12 (*)    All other components within normal limits  CBC  DIFFERENTIAL  I-STAT TROPOININ, ED    EKG  EKG Interpretation  Date/Time:  Saturday December 21 2015 11:41:22 EST Ventricular Rate:  96 PR Interval:  198 QRS Duration: 68 QT Interval:  374 QTC Calculation: 472 R Axis:   34 Text Interpretation:  Normal sinus rhythm Cannot rule out Anterior infarct , age undetermined Abnormal ECG No significant change was found Confirmed by Joren Rehm  MD, Lennette Bihari ([id number]) on 12/21/2015 2:12:44 PM       Radiology No results found.  Procedures Procedures (including critical care time)  Medications Ordered in ED Medications - No data to display   Initial Impression / Assessment and Plan / ED Course  I have reviewed the triage vital signs and the nursing notes.  Pertinent labs & imaging results that were available during my care of the patient were reviewed by me and considered in my medical decision making (see chart for details).  Clinical Course     Patient will undergo MRI to evaluate for cerebellar stroke.  If negative I think the patient can safely be discharged home.  Care to Dr Vanita Panda  Final Clinical Impressions(s) / ED Diagnoses   Final diagnoses:  None    New Prescriptions New Prescriptions   No medications on file     Jola Schmidt, MD 12/21/15 1523

## 2015-12-21 NOTE — ED Notes (Signed)
Pt returned from MRI and connected to the monitor 

## 2015-12-21 NOTE — ED Triage Notes (Signed)
Pt. Stated, I was going to the right when I woke up this morning, and went to my [employer] party and couldn't turn to right due to pain, using my eyes to look instead of my neck. Pt. Had no arm drip, equal smile, alert and oriented x 4 . Last normal when she went to bed.

## 2015-12-21 NOTE — ED Notes (Signed)
Pt assisted to use bedside commode

## 2015-12-21 NOTE — ED Notes (Signed)
Pt transported to MRI 

## 2015-12-24 DIAGNOSIS — R300 Dysuria: Secondary | ICD-10-CM | POA: Diagnosis not present

## 2015-12-24 DIAGNOSIS — M5416 Radiculopathy, lumbar region: Secondary | ICD-10-CM | POA: Diagnosis not present

## 2015-12-24 DIAGNOSIS — I480 Paroxysmal atrial fibrillation: Secondary | ICD-10-CM | POA: Diagnosis not present

## 2016-01-02 DIAGNOSIS — E559 Vitamin D deficiency, unspecified: Secondary | ICD-10-CM | POA: Diagnosis not present

## 2016-01-02 DIAGNOSIS — E784 Other hyperlipidemia: Secondary | ICD-10-CM | POA: Diagnosis not present

## 2016-01-02 DIAGNOSIS — I10 Essential (primary) hypertension: Secondary | ICD-10-CM | POA: Diagnosis not present

## 2016-01-07 DIAGNOSIS — Z1389 Encounter for screening for other disorder: Secondary | ICD-10-CM | POA: Diagnosis not present

## 2016-01-07 DIAGNOSIS — G894 Chronic pain syndrome: Secondary | ICD-10-CM | POA: Diagnosis not present

## 2016-01-07 DIAGNOSIS — R413 Other amnesia: Secondary | ICD-10-CM | POA: Diagnosis not present

## 2016-01-07 DIAGNOSIS — I480 Paroxysmal atrial fibrillation: Secondary | ICD-10-CM | POA: Diagnosis not present

## 2016-01-07 DIAGNOSIS — R300 Dysuria: Secondary | ICD-10-CM | POA: Diagnosis not present

## 2016-01-07 DIAGNOSIS — L309 Dermatitis, unspecified: Secondary | ICD-10-CM | POA: Diagnosis not present

## 2016-01-07 DIAGNOSIS — F329 Major depressive disorder, single episode, unspecified: Secondary | ICD-10-CM | POA: Diagnosis not present

## 2016-01-07 DIAGNOSIS — M5416 Radiculopathy, lumbar region: Secondary | ICD-10-CM | POA: Diagnosis not present

## 2016-01-07 DIAGNOSIS — Z1231 Encounter for screening mammogram for malignant neoplasm of breast: Secondary | ICD-10-CM | POA: Diagnosis not present

## 2016-01-07 DIAGNOSIS — Z6823 Body mass index (BMI) 23.0-23.9, adult: Secondary | ICD-10-CM | POA: Diagnosis not present

## 2016-01-07 DIAGNOSIS — Z0000 Encounter for general adult medical examination without abnormal findings: Secondary | ICD-10-CM | POA: Diagnosis not present

## 2016-01-07 DIAGNOSIS — G458 Other transient cerebral ischemic attacks and related syndromes: Secondary | ICD-10-CM | POA: Diagnosis not present

## 2016-02-18 DIAGNOSIS — R300 Dysuria: Secondary | ICD-10-CM | POA: Diagnosis not present

## 2016-02-18 DIAGNOSIS — N390 Urinary tract infection, site not specified: Secondary | ICD-10-CM | POA: Diagnosis not present

## 2016-02-19 DIAGNOSIS — Z6822 Body mass index (BMI) 22.0-22.9, adult: Secondary | ICD-10-CM | POA: Diagnosis not present

## 2016-02-19 DIAGNOSIS — N3010 Interstitial cystitis (chronic) without hematuria: Secondary | ICD-10-CM | POA: Diagnosis not present

## 2016-02-19 DIAGNOSIS — R300 Dysuria: Secondary | ICD-10-CM | POA: Diagnosis not present

## 2016-02-19 DIAGNOSIS — I10 Essential (primary) hypertension: Secondary | ICD-10-CM | POA: Diagnosis not present

## 2016-02-27 DIAGNOSIS — N3010 Interstitial cystitis (chronic) without hematuria: Secondary | ICD-10-CM | POA: Diagnosis not present

## 2016-03-23 DIAGNOSIS — L570 Actinic keratosis: Secondary | ICD-10-CM | POA: Diagnosis not present

## 2016-04-16 DIAGNOSIS — N3010 Interstitial cystitis (chronic) without hematuria: Secondary | ICD-10-CM | POA: Diagnosis not present

## 2016-04-20 DIAGNOSIS — N3010 Interstitial cystitis (chronic) without hematuria: Secondary | ICD-10-CM | POA: Diagnosis not present

## 2016-04-23 DIAGNOSIS — N3010 Interstitial cystitis (chronic) without hematuria: Secondary | ICD-10-CM | POA: Diagnosis not present

## 2016-04-27 DIAGNOSIS — N3010 Interstitial cystitis (chronic) without hematuria: Secondary | ICD-10-CM | POA: Diagnosis not present

## 2016-05-01 DIAGNOSIS — N3010 Interstitial cystitis (chronic) without hematuria: Secondary | ICD-10-CM | POA: Diagnosis not present

## 2016-05-04 DIAGNOSIS — N3010 Interstitial cystitis (chronic) without hematuria: Secondary | ICD-10-CM | POA: Diagnosis not present

## 2016-05-08 DIAGNOSIS — N3010 Interstitial cystitis (chronic) without hematuria: Secondary | ICD-10-CM | POA: Diagnosis not present

## 2016-05-27 ENCOUNTER — Emergency Department (HOSPITAL_COMMUNITY): Payer: Medicare Other

## 2016-05-27 ENCOUNTER — Encounter (HOSPITAL_COMMUNITY): Payer: Self-pay | Admitting: *Deleted

## 2016-05-27 ENCOUNTER — Inpatient Hospital Stay (HOSPITAL_COMMUNITY)
Admission: EM | Admit: 2016-05-27 | Discharge: 2016-06-02 | DRG: 470 | Disposition: A | Discharge status: Skilled Nursing Facility | Payer: Medicare Other | Attending: Internal Medicine | Admitting: Internal Medicine

## 2016-05-27 ENCOUNTER — Other Ambulatory Visit (INDEPENDENT_AMBULATORY_CARE_PROVIDER_SITE_OTHER): Payer: Self-pay | Admitting: Orthopedic Surgery

## 2016-05-27 ENCOUNTER — Inpatient Hospital Stay (HOSPITAL_COMMUNITY): Payer: Medicare Other

## 2016-05-27 DIAGNOSIS — S0990XA Unspecified injury of head, initial encounter: Secondary | ICD-10-CM | POA: Diagnosis not present

## 2016-05-27 DIAGNOSIS — S72012A Unspecified intracapsular fracture of left femur, initial encounter for closed fracture: Principal | ICD-10-CM | POA: Diagnosis present

## 2016-05-27 DIAGNOSIS — R278 Other lack of coordination: Secondary | ICD-10-CM | POA: Diagnosis not present

## 2016-05-27 DIAGNOSIS — I480 Paroxysmal atrial fibrillation: Secondary | ICD-10-CM | POA: Diagnosis not present

## 2016-05-27 DIAGNOSIS — F0390 Unspecified dementia without behavioral disturbance: Secondary | ICD-10-CM | POA: Diagnosis present

## 2016-05-27 DIAGNOSIS — D62 Acute posthemorrhagic anemia: Secondary | ICD-10-CM | POA: Diagnosis not present

## 2016-05-27 DIAGNOSIS — I10 Essential (primary) hypertension: Secondary | ICD-10-CM | POA: Diagnosis not present

## 2016-05-27 DIAGNOSIS — E559 Vitamin D deficiency, unspecified: Secondary | ICD-10-CM | POA: Diagnosis present

## 2016-05-27 DIAGNOSIS — Z79899 Other long term (current) drug therapy: Secondary | ICD-10-CM

## 2016-05-27 DIAGNOSIS — S72002A Fracture of unspecified part of neck of left femur, initial encounter for closed fracture: Secondary | ICD-10-CM

## 2016-05-27 DIAGNOSIS — Z5189 Encounter for other specified aftercare: Secondary | ICD-10-CM | POA: Diagnosis not present

## 2016-05-27 DIAGNOSIS — N3010 Interstitial cystitis (chronic) without hematuria: Secondary | ICD-10-CM | POA: Diagnosis not present

## 2016-05-27 DIAGNOSIS — M12552 Traumatic arthropathy, left hip: Secondary | ICD-10-CM | POA: Diagnosis not present

## 2016-05-27 DIAGNOSIS — Z888 Allergy status to other drugs, medicaments and biological substances status: Secondary | ICD-10-CM

## 2016-05-27 DIAGNOSIS — S299XXA Unspecified injury of thorax, initial encounter: Secondary | ICD-10-CM | POA: Diagnosis not present

## 2016-05-27 DIAGNOSIS — K219 Gastro-esophageal reflux disease without esophagitis: Secondary | ICD-10-CM | POA: Diagnosis present

## 2016-05-27 DIAGNOSIS — J90 Pleural effusion, not elsewhere classified: Secondary | ICD-10-CM | POA: Diagnosis not present

## 2016-05-27 DIAGNOSIS — W010XXA Fall on same level from slipping, tripping and stumbling without subsequent striking against object, initial encounter: Secondary | ICD-10-CM | POA: Diagnosis present

## 2016-05-27 DIAGNOSIS — Z7901 Long term (current) use of anticoagulants: Secondary | ICD-10-CM | POA: Diagnosis not present

## 2016-05-27 DIAGNOSIS — Z471 Aftercare following joint replacement surgery: Secondary | ICD-10-CM | POA: Diagnosis not present

## 2016-05-27 DIAGNOSIS — M8580 Other specified disorders of bone density and structure, unspecified site: Secondary | ICD-10-CM | POA: Diagnosis present

## 2016-05-27 DIAGNOSIS — Z8673 Personal history of transient ischemic attack (TIA), and cerebral infarction without residual deficits: Secondary | ICD-10-CM | POA: Diagnosis not present

## 2016-05-27 DIAGNOSIS — Z96642 Presence of left artificial hip joint: Secondary | ICD-10-CM | POA: Diagnosis not present

## 2016-05-27 DIAGNOSIS — R634 Abnormal weight loss: Secondary | ICD-10-CM | POA: Diagnosis not present

## 2016-05-27 DIAGNOSIS — G8911 Acute pain due to trauma: Secondary | ICD-10-CM | POA: Diagnosis not present

## 2016-05-27 DIAGNOSIS — R9431 Abnormal electrocardiogram [ECG] [EKG]: Secondary | ICD-10-CM | POA: Diagnosis not present

## 2016-05-27 DIAGNOSIS — M6281 Muscle weakness (generalized): Secondary | ICD-10-CM | POA: Diagnosis not present

## 2016-05-27 DIAGNOSIS — S72009A Fracture of unspecified part of neck of unspecified femur, initial encounter for closed fracture: Secondary | ICD-10-CM | POA: Diagnosis present

## 2016-05-27 DIAGNOSIS — S79911A Unspecified injury of right hip, initial encounter: Secondary | ICD-10-CM | POA: Diagnosis not present

## 2016-05-27 DIAGNOSIS — Z419 Encounter for procedure for purposes other than remedying health state, unspecified: Secondary | ICD-10-CM

## 2016-05-27 LAB — BASIC METABOLIC PANEL
Anion gap: 6 (ref 5–15)
BUN: 15 mg/dL (ref 6–20)
CO2: 26 mmol/L (ref 22–32)
CREATININE: 0.93 mg/dL (ref 0.44–1.00)
Calcium: 9 mg/dL (ref 8.9–10.3)
Chloride: 107 mmol/L (ref 101–111)
GFR calc non Af Amer: 56 mL/min — ABNORMAL LOW (ref >60)
Glucose, Bld: 106 mg/dL — ABNORMAL HIGH (ref 65–99)
Potassium: 3.7 mmol/L (ref 3.5–5.1)
SODIUM: 139 mmol/L (ref 135–145)

## 2016-05-27 LAB — CBC WITH DIFFERENTIAL/PLATELET
BASOS PCT: 0 %
Basophils Absolute: 0 10*3/uL (ref 0.0–0.1)
EOS ABS: 0 10*3/uL (ref 0.0–0.7)
EOS PCT: 0 %
HCT: 36.3 % (ref 36.0–46.0)
Hemoglobin: 12.3 g/dL (ref 12.0–15.0)
Lymphocytes Relative: 14 %
Lymphs Abs: 1.2 10*3/uL (ref 0.7–4.0)
MCH: 30.7 pg (ref 26.0–34.0)
MCHC: 33.9 g/dL (ref 30.0–36.0)
MCV: 90.5 fL (ref 78.0–100.0)
MONO ABS: 0.7 10*3/uL (ref 0.1–1.0)
MONOS PCT: 8 %
Neutro Abs: 6.7 10*3/uL (ref 1.7–7.7)
Neutrophils Relative %: 78 %
PLATELETS: 174 10*3/uL (ref 150–400)
RBC: 4.01 MIL/uL (ref 3.87–5.11)
RDW: 14.6 % (ref 11.5–15.5)
WBC: 8.7 10*3/uL (ref 4.0–10.5)

## 2016-05-27 LAB — PROTIME-INR
INR: 0.97
Prothrombin Time: 12.9 seconds (ref 11.4–15.2)

## 2016-05-27 LAB — I-STAT TROPONIN, ED: Troponin i, poc: 0 ng/mL (ref 0.00–0.08)

## 2016-05-27 MED ORDER — MIRABEGRON ER 25 MG PO TB24
50.0000 mg | ORAL_TABLET | Freq: Every day | ORAL | Status: DC
  Administered 2016-05-28 – 2016-06-02 (×5): 50 mg via ORAL
  Filled 2016-05-27 (×5): qty 2

## 2016-05-27 MED ORDER — PENTOSAN POLYSULFATE SODIUM 100 MG PO CAPS
200.0000 mg | ORAL_CAPSULE | Freq: Two times a day (BID) | ORAL | Status: DC
  Administered 2016-05-27 – 2016-06-02 (×11): 200 mg via ORAL
  Filled 2016-05-27 (×12): qty 2

## 2016-05-27 MED ORDER — PANTOPRAZOLE SODIUM 40 MG PO TBEC
40.0000 mg | DELAYED_RELEASE_TABLET | Freq: Every morning | ORAL | Status: DC
  Administered 2016-05-28 – 2016-06-02 (×5): 40 mg via ORAL
  Filled 2016-05-27 (×6): qty 1

## 2016-05-27 MED ORDER — HYDROCODONE-ACETAMINOPHEN 5-325 MG PO TABS
1.0000 | ORAL_TABLET | Freq: Four times a day (QID) | ORAL | Status: DC | PRN
  Administered 2016-05-27 – 2016-05-29 (×5): 2 via ORAL
  Filled 2016-05-27 (×6): qty 2

## 2016-05-27 MED ORDER — FENTANYL CITRATE (PF) 100 MCG/2ML IJ SOLN
50.0000 ug | Freq: Once | INTRAMUSCULAR | Status: AC
  Administered 2016-05-27: 50 ug via INTRAVENOUS
  Filled 2016-05-27: qty 2

## 2016-05-27 MED ORDER — VITAMIN B-12 1000 MCG PO TABS
1000.0000 ug | ORAL_TABLET | Freq: Every day | ORAL | Status: DC
  Administered 2016-05-28 – 2016-06-02 (×5): 1000 ug via ORAL
  Filled 2016-05-27 (×5): qty 1

## 2016-05-27 MED ORDER — VITAMIN D 1000 UNITS PO TABS
1000.0000 [IU] | ORAL_TABLET | Freq: Every day | ORAL | Status: DC
  Administered 2016-05-28 – 2016-06-02 (×5): 1000 [IU] via ORAL
  Filled 2016-05-27 (×5): qty 1

## 2016-05-27 MED ORDER — SODIUM CHLORIDE 0.9 % IV SOLN
INTRAVENOUS | Status: DC
  Administered 2016-05-28: 11:00:00 via INTRAVENOUS

## 2016-05-27 MED ORDER — DONEPEZIL HCL 5 MG PO TABS
5.0000 mg | ORAL_TABLET | Freq: Every day | ORAL | Status: DC
  Administered 2016-05-27 – 2016-06-01 (×6): 5 mg via ORAL
  Filled 2016-05-27 (×6): qty 1

## 2016-05-27 MED ORDER — LORAZEPAM 1 MG PO TABS
1.0000 mg | ORAL_TABLET | Freq: Every day | ORAL | Status: DC
  Administered 2016-05-27 – 2016-05-28 (×2): 2 mg via ORAL
  Administered 2016-05-29 – 2016-05-31 (×3): 1 mg via ORAL
  Administered 2016-06-01: 2 mg via ORAL
  Filled 2016-05-27: qty 1
  Filled 2016-05-27: qty 2
  Filled 2016-05-27 (×2): qty 1
  Filled 2016-05-27 (×2): qty 2

## 2016-05-27 MED ORDER — HYDROXYZINE HCL 25 MG PO TABS
50.0000 mg | ORAL_TABLET | Freq: Every day | ORAL | Status: DC
  Administered 2016-05-27 – 2016-06-01 (×4): 50 mg via ORAL
  Filled 2016-05-27 (×6): qty 2

## 2016-05-27 MED ORDER — SENNA 8.6 MG PO TABS
1.0000 | ORAL_TABLET | Freq: Two times a day (BID) | ORAL | Status: DC
  Administered 2016-05-27 – 2016-06-02 (×11): 8.6 mg via ORAL
  Filled 2016-05-27 (×10): qty 1

## 2016-05-27 MED ORDER — MORPHINE SULFATE (PF) 4 MG/ML IV SOLN
0.5000 mg | INTRAVENOUS | Status: DC | PRN
  Administered 2016-05-29: 0.52 mg via INTRAVENOUS
  Filled 2016-05-27: qty 1

## 2016-05-27 MED ORDER — LOSARTAN POTASSIUM 50 MG PO TABS
50.0000 mg | ORAL_TABLET | Freq: Every morning | ORAL | Status: DC
  Administered 2016-05-28 – 2016-06-02 (×3): 50 mg via ORAL
  Filled 2016-05-27 (×5): qty 1

## 2016-05-27 MED ORDER — SODIUM CHLORIDE 0.9 % IV SOLN
Freq: Once | INTRAVENOUS | Status: AC
  Administered 2016-05-27: 16:00:00 via INTRAVENOUS

## 2016-05-27 MED ORDER — MORPHINE SULFATE (PF) 2 MG/ML IV SOLN
0.5000 mg | INTRAVENOUS | Status: DC | PRN
  Administered 2016-05-27: 0.5 mg via INTRAVENOUS
  Filled 2016-05-27: qty 1

## 2016-05-27 NOTE — ED Provider Notes (Signed)
[facility] DEPT Provider Note   CSN: [id number] Arrival date & time: 05/27/16  1158     History   Chief Complaint Chief Complaint  Patient presents with  . Fall    HPI Christine [name] is a 83 y.o. female.  HPI   71 old female with past medical history as below here with mechanical fall. The patient was playing with her grandchildren today when she tripped. She fell directly onto her left hip. She had immediate onset of aching, severe left hip pain. The pain is worse with any movement and palpation. She has not been able to bear weight on it since the episode. She denies any head trauma. Denies any headache. No other injuries. She was in her usual state of health prior to the fall.  Past Medical History:  Diagnosis Date  . AR (allergic rhinitis)   . Arthritis   . Bilateral carotid artery stenosis    MILD --  40% BILATERAL ICA PER DOPPLER JULY 2014  . Diverticulosis   . Frequency of urination   . GERD (gastroesophageal reflux disease)   . History of colon polyps    ADENOMATOUS  . History of Helicobacter pylori infection   . History of shingles   . History of TIA (transient ischemic attack)   . Hyperlipidemia   . Hypertension   . Impaired memory   . Interstitial cystitis   . Iron deficiency   . Nocturia   . Osteopenia   . PAF (paroxysmal atrial fibrillation) ([place])    dx 07/ 2015  . Pelvic pain in female   . Urgency of urination   . Vitamin D deficiency   . Wears glasses     Patient Active Problem List   Diagnosis Date Noted  . Closed left hip fracture ([place]) 05/27/2016  . Acute kidney injury ([place]) 10/01/2015  . Acute encephalopathy 10/01/2015  . Interstitial cystitis 10/01/2015  . Essential hypertension 10/01/2015  . Rhabdomyolysis 09/30/2015  . UTI (lower urinary tract infection) 07/16/2013  . PAF (paroxysmal atrial fibrillation) ([place]) 07/16/2013  . Chest pain 07/16/2013  . ABDOMINAL PAIN, UNSPECIFIED SITE 02/03/2010  . DYSPEPSIA 01/18/2008  .  ADENOMATOUS COLONIC POLYP 01/17/2008  . HEMORRHOIDS, INTERNAL 01/17/2008  . ESOPHAGITIS 01/17/2008  . DIVERTICULOSIS, COLON 01/17/2008  . HEMORRHOIDS-EXTERNAL 12/07/2007  . GERD 12/07/2007  . CONSTIPATION 12/07/2007  . RECTAL BLEEDING 12/07/2007  . Loss of weight 12/07/2007  . Nausea alone 12/07/2007  . HEARTBURN 12/07/2007  . ABDOMINAL BLOATING 12/07/2007  . PERSONAL HX COLONIC POLYPS 12/07/2007    Past Surgical History:  Procedure Laterality Date  . ABDOMINAL HYSTERECTOMY    . CATARACT EXTRACTION W/ INTRAOCULAR LENS  IMPLANT, BILATERAL  2009  . CYSTO WITH HYDRODISTENSION  02/24/2011   Procedure: CYSTOSCOPY/HYDRODISTENSION;  Surgeon: Reece Packer, MD;  Location: [facility];  Service: Urology;  Laterality: N/A;  INSTILLATION OF marcaine and  pyridium  . CYSTO WITH HYDRODISTENSION N/A 06/20/2013   Procedure: CYSTOSCOPY/HYDRODISTENSION/INSTALLATION OF PYRIDIUM -MARCAINE 0.5%.;  Surgeon: Reece Packer, MD;  Location: [facility];  Service: Urology;  Laterality: N/A;  . CYSTO WITH HYDRODISTENSION N/A 03/22/2014   Procedure: CYSTOSCOPY/HYDRODISTENSION INSTILLATION OF MARCAINE AND PYRDIUM ;  Surgeon: Bjorn Loser, MD;  Location: [facility];  Service: Urology;  Laterality: N/A;  . CYSTO WITH HYDRODISTENSION N/A 09/17/2014   Procedure: CYSTOSCOPY HYDRODISTENSION INSTILLATION OF MARCAINE AND PYRIDIUM;  Surgeon: Bjorn Loser, MD;  Location: [facility];  Service: Urology;  Laterality: N/A;  . CYSTO/  HOD/ INSTILLATION THERAPY  10-04-2008  &  05-02-2009  . CYSTO/ LEFT URETEROSCOPY W/ LEFT URETERAL DILATION  04-15-2006  . TRANSTHORACIC ECHOCARDIOGRAM  07-17-2013   grade I diastolic dysfuntion/  ef 01-09%/  mild AR and MR/  trivial TR  . URETEROLITHOTOMY Bilateral 11-24-2004 LEFT  &  05-29-2006  RIGHT  . VAULT SUSPENSION WITH GRAFT/ CYSTOCELE REPAIR  09-17-2009    OB History    No data available       Home  Medications    Prior to Admission medications   Medication Sig Start Date End Date Taking? Authorizing Provider  alendronate (FOSAMAX) 70 MG tablet Take 1 tablet by mouth every 7 (seven) days. 10/20/15  Yes [provider]  Cholecalciferol (VITAMIN D3) 1000 UNITS CAPS Take 1,000 Units by mouth daily.    Yes [provider]  donepezil (ARICEPT) 5 MG tablet Take 5 mg by mouth at bedtime. 09/12/15  Yes [provider]  hydrOXYzine (ATARAX/VISTARIL) 25 MG tablet Take 50 mg by mouth at bedtime.  08/27/15  Yes [provider]  LORazepam (ATIVAN) 1 MG tablet Take 1-2 mg by mouth at bedtime.  08/27/15  Yes [provider]  losartan (COZAAR) 50 MG tablet Take 50 mg by mouth every morning.    Yes [provider]  mirabegron ER (MYRBETRIQ) 50 MG TB24 tablet Take 50 mg by mouth daily. 12/25/14  Yes [provider]  naproxen sodium (ALEVE) 220 MG tablet Take 440 mg by mouth daily as needed.    Yes [provider]  pantoprazole (PROTONIX) 40 MG tablet Take 40 mg by mouth every morning.    Yes [provider]  pentosan polysulfate (ELMIRON) 100 MG capsule Take 200 mg by mouth 2 (two) times daily. 08/27/15  Yes [provider]  Rivaroxaban (XARELTO) 15 MG TABS tablet Take 1 tablet (15 mg total) by mouth daily with supper. Patient taking differently: Take 15 mg by mouth every morning.  07/17/13  Yes Buriev, Arie Sabina, MD  traMADol (ULTRAM) 50 MG tablet Take 50 mg by mouth 2 (two) times daily as needed for moderate pain.    Yes [provider]  vitamin B-12 (CYANOCOBALAMIN) 1000 MCG tablet Take 1,000 mcg by mouth daily.   Yes [provider]    Family History History reviewed. No pertinent family history.  Social History Social History  Substance Use Topics  . Smoking status: Never Smoker  . Smokeless tobacco: Never Used  . Alcohol use No     Allergies   Gabapentin   Review of Systems Review of  Systems  Constitutional: Negative for chills, fatigue and fever.  HENT: Negative for congestion and rhinorrhea.   Eyes: Negative for visual disturbance.  Respiratory: Negative for cough, shortness of breath and wheezing.   Cardiovascular: Negative for chest pain and leg swelling.  Gastrointestinal: Negative for abdominal pain, diarrhea, nausea and vomiting.  Genitourinary: Negative for dysuria and flank pain.  Musculoskeletal: Positive for arthralgias and gait problem. Negative for neck pain and neck stiffness.  Skin: Negative for rash and wound.  Allergic/Immunologic: Negative for immunocompromised state.  Neurological: Negative for syncope, weakness and headaches.  All other systems reviewed and are negative.    Physical Exam Updated Vital Signs BP (!) 134/58 (BP Location: Right Arm)   Pulse (!) 58   Temp 98 F (36.7 C) (Oral)   Resp 18   SpO2 95%   Physical Exam  Constitutional: She is oriented to person, place, and time. She appears well-developed  and well-nourished. No distress.  HENT:  Head: Normocephalic and atraumatic.  Eyes: Conjunctivae are normal.  Neck: Neck supple.  Cardiovascular: Normal rate, regular rhythm and normal heart sounds.  Exam reveals no friction rub.   No murmur heard. Pulmonary/Chest: Effort normal and breath sounds normal. No respiratory distress. She has no wheezes. She has no rales.  Abdominal: She exhibits no distension.  Musculoskeletal: She exhibits no edema.  Neurological: She is alert and oriented to person, place, and time. She exhibits normal muscle tone.  Skin: Skin is warm. Capillary refill takes less than 2 seconds.  Psychiatric: She has a normal mood and affect.  Nursing note and vitals reviewed.   LOWER EXTREMITY EXAM: LEFT  INSPECTION & PALPATION: No gross deformity. No open wounds. Moderate TTP over left greater trochanter. No shortening but mild internal rotation. Significant TTP with log roll/rotation of leg at  hip.  SENSORY: sensation is intact to light touch in:  Superficial peroneal nerve distribution (over dorsum of foot) Deep peroneal nerve distribution (over first dorsal web space) Sural nerve distribution (over lateral aspect 5th metatarsal) Saphenous nerve distribution (over medial instep)  MOTOR:  + Motor EHL (great toe dorsiflexion) + FHL (great toe plantar flexion)  + TA (ankle dorsiflexion)  + GSC (ankle plantar flexion)  VASCULAR: 2+ dorsalis pedis and posterior tibialis pulses Capillary refill < 2 sec, toes warm and well-perfused  COMPARTMENTS: Soft, warm, well-perfused No pain with passive extension No parethesias    ED Treatments / Results  Labs (all labs ordered are listed, but only abnormal results are displayed) Labs Reviewed  BASIC METABOLIC PANEL - Abnormal; Notable for the following:       Result Value   Glucose, Bld 106 (*)    GFR calc non Af Amer 56 (*)    All other components within normal limits  SURGICAL PCR SCREEN  CBC WITH DIFFERENTIAL/PLATELET  PROTIME-INR  Randolm Idol, ED    EKG  EKG Interpretation None       Radiology Dg Chest 1 View  Result Date: 05/27/2016 CLINICAL DATA:  Hip fracture.  Fall. EXAM: CHEST 1 VIEW COMPARISON:  09/30/2015 FINDINGS: The heart size and mediastinal contours are within normal limits. Aortic atherosclerosis noted. Both lungs are clear. The visualized skeletal structures are unremarkable. IMPRESSION: No active disease. Aortic Atherosclerosis (ICD10-I70.0). Electronically Signed   By: Kerby Moors M.D.   On: 05/27/2016 13:27   Ct Head Wo Contrast  Result Date: 05/27/2016 CLINICAL DATA:  Patient is alert and oriented x4. She is being see post fall at home. Patient was playing with her grandchildren and fell. Patient is currently complaining left hip pain on movement. Pt. Unsure if she hit her head EXAM: CT HEAD WITHOUT CONTRAST TECHNIQUE: Contiguous axial images were obtained from the base of the skull  through the vertex without intravenous contrast. COMPARISON:  Brain MRI 12/21/2015 FINDINGS: Brain: No intracranial hemorrhage. No parenchymal contusion. No midline shift or mass effect. Basilar cisterns are patent. No skull base fracture. No fluid in the paranasal sinuses or mastoid air cells. Orbits are normal. There are periventricular and subcortical white matter hypodensities. Generalized cortical atrophy. Brain: No acute intracranial hemorrhage. No focal mass lesion. No CT evidence of acute infarction. No midline shift or mass effect. No hydrocephalus. Basilar cisterns are patent. Vascular: No hyperdense vessel or unexpected calcification. Skull: Normal. Negative for fracture or focal lesion. Sinuses/Orbits: Paranasal sinuses and mastoid air cells are clear. Orbits are clear. Other: None. IMPRESSION: 1. No acute intracranial findings. 2.  Atrophy and white matter microvascular disease. Electronically Signed   By: Suzy Bouchard M.D.   On: 05/27/2016 15:52   Dg Hip Unilat W Or Wo Pelvis 2-3 Views Left  Result Date: 05/27/2016 CLINICAL DATA:  Left hip pain since a fall today. Initial encounter. EXAM: DG HIP (WITH OR WITHOUT PELVIS) 2-3V LEFT COMPARISON:  None. FINDINGS: The patient has an acute subcapital fracture of the left hip. No other acute bony or joint abnormality is identified. Joint spaces are unremarkable. IMPRESSION: Acute subcapital left hip fracture. Electronically Signed   By: Inge Rise M.D.   On: 05/27/2016 13:25    Procedures Procedures (including critical care time)  Medications Ordered in ED Medications  donepezil (ARICEPT) tablet 5 mg (5 mg Oral Given 05/27/16 2136)  hydrOXYzine (ATARAX/VISTARIL) tablet 50 mg (50 mg Oral Given 05/27/16 2136)  LORazepam (ATIVAN) tablet 1-2 mg (2 mg Oral Given 05/27/16 2136)  pentosan polysulfate (ELMIRON) capsule 200 mg (200 mg Oral Given 05/27/16 2136)  pantoprazole (PROTONIX) EC tablet 40 mg (not administered)  cholecalciferol (VITAMIN D)  tablet 1,000 Units (not administered)  vitamin B-12 (CYANOCOBALAMIN) tablet 1,000 mcg (not administered)  mirabegron ER (MYRBETRIQ) tablet 50 mg (not administered)  losartan (COZAAR) tablet 50 mg (not administered)  HYDROcodone-acetaminophen (NORCO/VICODIN) 5-325 MG per tablet 1-2 tablet (2 tablets Oral Given 05/28/16 0535)  0.9 %  sodium chloride infusion (not administered)  senna (SENOKOT) tablet 8.6 mg (8.6 mg Oral Given 05/27/16 2136)  morphine 4 MG/ML injection 0.52 mg (not administered)  fentaNYL (SUBLIMAZE) injection 50 mcg (50 mcg Intravenous Given 05/27/16 1458)  0.9 %  sodium chloride infusion ( Intravenous New Bag/Given 05/27/16 1556)     Initial Impression / Assessment and Plan / ED Course  I have reviewed the triage vital signs and the nursing notes.  Pertinent labs & imaging results that were available during my care of the patient were reviewed by me and considered in my medical decision making (see chart for details).     83 year old female here with left subcapital hip fracture after mechanical fall. Discussed with Dr. Marlou Sa of [facility] orthopedics, will admit to the hospitalist service at [facility] for operative treatment, likely tomorrow. Patient is otherwise at her baseline state of health. Labs unremarkable. Of note, pt on Xarelto - medicine, ortho aware.  Final Clinical Impressions(s) / ED Diagnoses   Final diagnoses:  On anticoagulant therapy  Subcapital fracture of hip, left, closed, initial encounter [facility])    New Prescriptions Current Discharge Medication List       Duffy Bruce, MD 05/28/16 [phone]

## 2016-05-27 NOTE — ED Notes (Signed)
Bed: LN79 Expected date:  Expected time:  Means of arrival:  Comments: 83 yo f fall, left hip pain

## 2016-05-27 NOTE — ED Triage Notes (Signed)
Patient is alert and oriented x4.  She is being see post fall at home.  Patient was playing with her grandchildren and fell.  Patient is currently complaining left hip pain on movement.  Currently 10 of 10 with movement.

## 2016-05-27 NOTE — Consult Note (Addendum)
Reason for Consult:hip pain Referring Physician: Dr Christine [name] is an 83 y.o. female.  HPI: Christine [name] is an 83 year old patient with left hip pain.  She sustained a fall today.  She does take Xarelto.  She had that dose this morning.  She denies any other orthopedic complaints.  Radiographs show displaced subcapital femoral neck fracture.  Head CT negative.  She lives with her daughter in close proximity.  She is a Hydrographic surveyor.  Past Medical History:  Diagnosis Date  . AR (allergic rhinitis)   . Arthritis   . Bilateral carotid artery stenosis    MILD --  40% BILATERAL ICA PER DOPPLER JULY 2014  . Diverticulosis   . Frequency of urination   . GERD (gastroesophageal reflux disease)   . History of colon polyps    ADENOMATOUS  . History of Helicobacter pylori infection   . History of shingles   . History of TIA (transient ischemic attack)   . Hyperlipidemia   . Hypertension   . Impaired memory   . Interstitial cystitis   . Iron deficiency   . Nocturia   . Osteopenia   . PAF (paroxysmal atrial fibrillation) ([place])    dx 07/ 2015  . Pelvic pain in female   . Urgency of urination   . Vitamin D deficiency   . Wears glasses     Past Surgical History:  Procedure Laterality Date  . ABDOMINAL HYSTERECTOMY    . CATARACT EXTRACTION W/ INTRAOCULAR LENS  IMPLANT, BILATERAL  2009  . CYSTO WITH HYDRODISTENSION  02/24/2011   Procedure: CYSTOSCOPY/HYDRODISTENSION;  Surgeon: Reece Packer, MD;  Location: [facility];  Service: Urology;  Laterality: N/A;  INSTILLATION OF marcaine and  pyridium  . CYSTO WITH HYDRODISTENSION N/A 06/20/2013   Procedure: CYSTOSCOPY/HYDRODISTENSION/INSTALLATION OF PYRIDIUM -MARCAINE 0.5%.;  Surgeon: Reece Packer, MD;  Location: [place];  Service: Urology;  Laterality: N/A;  . CYSTO WITH HYDRODISTENSION N/A 03/22/2014   Procedure: CYSTOSCOPY/HYDRODISTENSION INSTILLATION OF MARCAINE AND PYRDIUM ;  Surgeon:  Bjorn Loser, MD;  Location: [place];  Service: Urology;  Laterality: N/A;  . CYSTO WITH HYDRODISTENSION N/A 09/17/2014   Procedure: CYSTOSCOPY HYDRODISTENSION INSTILLATION OF MARCAINE AND PYRIDIUM;  Surgeon: Bjorn Loser, MD;  Location: [place];  Service: Urology;  Laterality: N/A;  . CYSTO/ HOD/ INSTILLATION THERAPY  10-04-2008  &  05-02-2009  . CYSTO/ LEFT URETEROSCOPY W/ LEFT URETERAL DILATION  04-15-2006  . TRANSTHORACIC ECHOCARDIOGRAM  07-17-2013   grade I diastolic dysfuntion/  ef 03-47%/  mild AR and MR/  trivial TR  . URETEROLITHOTOMY Bilateral 11-24-2004 LEFT  &  05-29-2006  RIGHT  . VAULT SUSPENSION WITH GRAFT/ CYSTOCELE REPAIR  09-17-2009    History reviewed. No pertinent family history.  Social History:  reports that she has never smoked. She has never used smokeless tobacco. She reports that she does not drink alcohol or use drugs.  Allergies:  Allergies  Allergen Reactions  . Gabapentin Other (See Comments)    UNKNOWN    Medications: I have reviewed the patient's current medications.  Results for orders placed or performed during the hospital encounter of 05/27/16 (from the past 48 hour(s))  CBC with Differential     Status: None   Collection Time: 05/27/16  3:24 PM  Result Value Ref Range   WBC 8.7 4.0 - 10.5 K/uL   RBC 4.01 3.87 - 5.11 MIL/uL   Hemoglobin 12.3 12.0 - 15.0 g/dL   HCT  36.3 36.0 - 46.0 %   MCV 90.5 78.0 - 100.0 fL   MCH 30.7 26.0 - 34.0 pg   MCHC 33.9 30.0 - 36.0 g/dL   RDW 14.6 11.5 - 15.5 %   Platelets 174 150 - 400 K/uL   Neutrophils Relative % 78 %   Neutro Abs 6.7 1.7 - 7.7 K/uL   Lymphocytes Relative 14 %   Lymphs Abs 1.2 0.7 - 4.0 K/uL   Monocytes Relative 8 %   Monocytes Absolute 0.7 0.1 - 1.0 K/uL   Eosinophils Relative 0 %   Eosinophils Absolute 0.0 0.0 - 0.7 K/uL   Basophils Relative 0 %   Basophils Absolute 0.0 0.0 - 0.1 K/uL  Basic metabolic panel     Status: Abnormal   Collection  Time: 05/27/16  3:24 PM  Result Value Ref Range   Sodium 139 135 - 145 mmol/L   Potassium 3.7 3.5 - 5.1 mmol/L   Chloride 107 101 - 111 mmol/L   CO2 26 22 - 32 mmol/L   Glucose, Bld 106 (H) 65 - 99 mg/dL   BUN 15 6 - 20 mg/dL   Creatinine, Ser 0.93 0.44 - 1.00 mg/dL   Calcium 9.0 8.9 - 10.3 mg/dL   GFR calc non Af Amer 56 (L) >60 mL/min   GFR calc Af Amer >60 >60 mL/min    Comment: (NOTE) The eGFR has been calculated using the CKD EPI equation. This calculation has not been validated in all clinical situations. eGFR's persistently <60 mL/min signify possible Chronic Kidney Disease.    Anion gap 6 5 - 15  Protime-INR     Status: None   Collection Time: 05/27/16  3:24 PM  Result Value Ref Range   Prothrombin Time 12.9 11.4 - 15.2 seconds   INR 0.97   I-Stat Troponin, ED (not at [facility])     Status: None   Collection Time: 05/27/16  3:32 PM  Result Value Ref Range   Troponin i, poc 0.00 0.00 - 0.08 ng/mL   Comment 3            Comment: Due to the release kinetics of cTnI, a negative result within the first hours of the onset of symptoms does not rule out myocardial infarction with certainty. If myocardial infarction is still suspected, repeat the test at appropriate intervals.     Dg Chest 1 View  Result Date: 05/27/2016 CLINICAL DATA:  Hip fracture.  Fall. EXAM: CHEST 1 VIEW COMPARISON:  09/30/2015 FINDINGS: The heart size and mediastinal contours are within normal limits. Aortic atherosclerosis noted. Both lungs are clear. The visualized skeletal structures are unremarkable. IMPRESSION: No active disease. Aortic Atherosclerosis (ICD10-I70.0). Electronically Signed   By: Kerby Moors M.D.   On: 05/27/2016 13:27   Ct Head Wo Contrast  Result Date: 05/27/2016 CLINICAL DATA:  Patient is alert and oriented x4. She is being see post fall at home. Patient was playing with her grandchildren and fell. Patient is currently complaining left hip pain on movement. Pt. Unsure if she hit  her head EXAM: CT HEAD WITHOUT CONTRAST TECHNIQUE: Contiguous axial images were obtained from the base of the skull through the vertex without intravenous contrast. COMPARISON:  Brain MRI 12/21/2015 FINDINGS: Brain: No intracranial hemorrhage. No parenchymal contusion. No midline shift or mass effect. Basilar cisterns are patent. No skull base fracture. No fluid in the paranasal sinuses or mastoid air cells. Orbits are normal. There are periventricular and subcortical white matter hypodensities. Generalized cortical atrophy. Brain: No  acute intracranial hemorrhage. No focal mass lesion. No CT evidence of acute infarction. No midline shift or mass effect. No hydrocephalus. Basilar cisterns are patent. Vascular: No hyperdense vessel or unexpected calcification. Skull: Normal. Negative for fracture or focal lesion. Sinuses/Orbits: Paranasal sinuses and mastoid air cells are clear. Orbits are clear. Other: None. IMPRESSION: 1. No acute intracranial findings. 2. Atrophy and white matter microvascular disease. Electronically Signed   By: Suzy Bouchard M.D.   On: 05/27/2016 15:52   Dg Hip Unilat W Or Wo Pelvis 2-3 Views Left  Result Date: 05/27/2016 CLINICAL DATA:  Left hip pain since a fall today. Initial encounter. EXAM: DG HIP (WITH OR WITHOUT PELVIS) 2-3V LEFT COMPARISON:  None. FINDINGS: The patient has an acute subcapital fracture of the left hip. No other acute bony or joint abnormality is identified. Joint spaces are unremarkable. IMPRESSION: Acute subcapital left hip fracture. Electronically Signed   By: Inge Rise M.D.   On: 05/27/2016 13:25    Review of Systems  Musculoskeletal: Positive for joint pain.  All other systems reviewed and are negative.  Blood pressure (!) 160/60, pulse 70, temperature 98.3 F (36.8 C), temperature source Oral, resp. rate 14, SpO2 97 %. Physical Exam  Constitutional: She appears well-developed.  HENT:  Head: Normocephalic.  Eyes: Pupils are equal, round, and  reactive to light.  Neck: Normal range of motion.  Cardiovascular: Normal rate.   Respiratory: Effort normal.  Neurological: She is alert.  Skin: Skin is warm.  Psychiatric: She has a normal mood and affect.   examination of upper extremities demonstrates good range of motion of bilateral elbow shoulder and wrist.  Radial pulse intact.  Right lower chemise demonstrates palpable pedal pulses intact ankle dorsiflexion.  No groin pain with internal/external rotation of the right leg.  On the left side leg lengths approximately equal.  Pedal pulses palpable.  She has minimal swelling in the left hip region.  Does have a lot of pain with any attempted range of motion.  Assessment/Plan: Impression is left hip fracture in an 83 year old ambulatory patient who is on Xarelto.  Plan is for direct anterior hip replacement on that left side on Friday.  This will allow for slightly more than 48 hours after last Xarelto dose.  Risks and benefits are discussed with the patient including not limited to infection or vessel damage leg length inequality dislocation.  All questions answered.  SCDs applied.  Foley inserted.  All questions answered.  Landry Dyke Leniyah Martell 05/27/2016, 6:38 PM

## 2016-05-27 NOTE — ED Notes (Signed)
Attempted to call report.  Elizabeth RN asked to call back in a few minutes.

## 2016-05-27 NOTE — Progress Notes (Signed)
Admitting patient to 5N room 10. From [facility] ER. Report received at 1606 from [place], [place]. Report also received from EMS personnel. Placed safely in bed.

## 2016-05-27 NOTE — ED Notes (Signed)
Patient transported to CT 

## 2016-05-27 NOTE — H&P (Signed)
HISTORY AND PHYSICAL       PATIENT DETAILS Name: Christine [name] Age: 83 y.o. Sex: female Date of Birth: 03/01/1933 Admit Date: 05/27/2016 ZOX:WRUEAV, Loraine Leriche, MD   Patient coming from: Home   CHIEF COMPLAINT:  Mechanical fall with left hip pain  HPI: Christine [name] is a 83 y.o. female with medical history significant of mild dementia, paroxysmal atrial fibrillation on anticoagulation, hypertension brought to the ED for evaluation of the above-noted complaint. Patient was playing with her great grandchildren today, and unfortunately tripped and fell on her left side. She has severe hip pain following this fall, she was unable to ambulate. She was subsequently brought to the ED, where a x-ray confirmed left hip fracture.  Patient denied any loss of consciousness.  At baseline, patient is [name] is easily able to do her household chores, she is easily able to walk approximately 800 yards without any difficulty. Per family, she is easily able to climb a couple of flights of stairs without major issues.  There is no history of fever, headache, visual changes, neck pain, chest pain, shortness of breath, nausea, vomiting or diarrhea.  Please note-patient is on anticoagulation with Xarelto-last dose was on 5/23  ED Course:  In the emergency room where x-ray confirmed a left hip fracture-CT head, CBC, chemistry panel and all other relevant lab work were still pending at the time of this H&P. Hospitalist was subsequently asked to admit this patient. Note patient spoke with Dr. August Saucer from [facility] orthopedics, who requested patient be transferred to [facility] for orthopedic evaluation and hip repair.  Note: Lives at: Home Mobility:Independent Chronic Indwelling Foley:no   REVIEW OF SYSTEMS:  Constitutional:   No  weight loss, night sweats,  Fevers, chills, fatigue.  HEENT:    No headaches, Dysphagia,Tooth/dental problems,Sore throat  Cardio-vascular: No  chest pain,Orthopnea, PND,lower extremity edema, anasarca, palpitations  GI:  No heartburn, indigestion, abdominal pain, nausea, vomiting, diarrhea, melena or hematochezia  Resp: No shortness of breath, cough, hemoptysis,plueritic chest pain.   Skin:  No rash or lesions.  GU:  No dysuria, change in color of urine, no urgency or frequency.  No flank pain.  Musculoskeletal: No joint pain or swelling.  No decreased range of motion.  No back pain.  Endocrine: No heat intolerance, no cold intolerance, no polyuria, no polydipsia  Psych: No change in mood or affect. No depression or anxiety.    ALLERGIES:   Allergies  Allergen Reactions  . Gabapentin Other (See Comments)    UNKNOWN    PAST MEDICAL HISTORY: Past Medical History:  Diagnosis Date  . AR (allergic rhinitis)   . Arthritis   . Bilateral carotid artery stenosis    MILD --  40% BILATERAL ICA PER DOPPLER JULY 2014  . Diverticulosis   . Frequency of urination   . GERD (gastroesophageal reflux disease)   . History of colon polyps    ADENOMATOUS  . History of Helicobacter pylori infection   . History of shingles   . History of TIA (transient ischemic attack)   . Hyperlipidemia   . Hypertension   . Impaired memory   . Interstitial cystitis   . Iron deficiency   . Nocturia   . Osteopenia   . PAF (paroxysmal atrial fibrillation) ([facility])    dx 07/ 2015  . Pelvic pain in female   . Urgency of urination   . Vitamin D deficiency   . Wears glasses  PAST SURGICAL HISTORY: Past Surgical History:  Procedure Laterality Date  . ABDOMINAL HYSTERECTOMY    . CATARACT EXTRACTION W/ INTRAOCULAR LENS  IMPLANT, BILATERAL  2009  . CYSTO WITH HYDRODISTENSION  02/24/2011   Procedure: CYSTOSCOPY/HYDRODISTENSION;  Surgeon: Martina Sinner, MD;  Location: [facility];  Service: Urology;  Laterality: N/A;  INSTILLATION OF marcaine and  pyridium  . CYSTO WITH HYDRODISTENSION N/A 06/20/2013   Procedure:  CYSTOSCOPY/HYDRODISTENSION/INSTALLATION OF PYRIDIUM -MARCAINE 0.5%.;  Surgeon: Martina Sinner, MD;  Location: [facility];  Service: Urology;  Laterality: N/A;  . CYSTO WITH HYDRODISTENSION N/A 03/22/2014   Procedure: CYSTOSCOPY/HYDRODISTENSION INSTILLATION OF MARCAINE AND PYRDIUM ;  Surgeon: Alfredo Martinez, MD;  Location: [facility];  Service: Urology;  Laterality: N/A;  . CYSTO WITH HYDRODISTENSION N/A 09/17/2014   Procedure: CYSTOSCOPY HYDRODISTENSION INSTILLATION OF MARCAINE AND PYRIDIUM;  Surgeon: Alfredo Martinez, MD;  Location: [facility];  Service: Urology;  Laterality: N/A;  . CYSTO/ HOD/ INSTILLATION THERAPY  10-04-2008  &  05-02-2009  . CYSTO/ LEFT URETEROSCOPY W/ LEFT URETERAL DILATION  04-15-2006  . TRANSTHORACIC ECHOCARDIOGRAM  07-17-2013   grade I diastolic dysfuntion/  ef 60-65%/  mild AR and MR/  trivial TR  . URETEROLITHOTOMY Bilateral 11-24-2004 LEFT  &  05-29-2006  RIGHT  . VAULT SUSPENSION WITH GRAFT/ CYSTOCELE REPAIR  09-17-2009    MEDICATIONS AT HOME: Prior to Admission medications   Medication Sig Start Date End Date Taking? Authorizing Provider  alendronate (FOSAMAX) 70 MG tablet Take 1 tablet by mouth every 7 (seven) days. 10/20/15  Yes [provider]  Cholecalciferol (VITAMIN D3) 1000 UNITS CAPS Take 1,000 Units by mouth daily.    Yes [provider]  donepezil (ARICEPT) 5 MG tablet Take 5 mg by mouth at bedtime. 09/12/15  Yes [provider]  hydrOXYzine (ATARAX/VISTARIL) 25 MG tablet Take 50 mg by mouth at bedtime.  08/27/15  Yes [provider]  LORazepam (ATIVAN) 1 MG tablet Take 1-2 mg by mouth at bedtime.  08/27/15  Yes [provider]  losartan (COZAAR) 50 MG tablet Take 50 mg by mouth every morning.    Yes [provider]  mirabegron ER (MYRBETRIQ) 50 MG TB24 tablet Take 50 mg by mouth daily. 12/25/14  Yes [provider]  naproxen sodium (ALEVE) 220  MG tablet Take 440 mg by mouth daily as needed.    Yes [provider]  pantoprazole (PROTONIX) 40 MG tablet Take 40 mg by mouth every morning.    Yes [provider]  pentosan polysulfate (ELMIRON) 100 MG capsule Take 200 mg by mouth 2 (two) times daily. 08/27/15  Yes [provider]  Rivaroxaban (XARELTO) 15 MG TABS tablet Take 1 tablet (15 mg total) by mouth daily with supper. Patient taking differently: Take 15 mg by mouth every morning.  07/17/13  Yes Buriev, Isaiah Serge, MD  traMADol (ULTRAM) 50 MG tablet Take 50 mg by mouth 2 (two) times daily as needed for moderate pain.    Yes [provider]  vitamin B-12 (CYANOCOBALAMIN) 1000 MCG tablet Take 1,000 mcg by mouth daily.   Yes [provider]    FAMILY HISTORY: No family history of CAD  SOCIAL HISTORY:  reports that she has never smoked. She has never used smokeless tobacco. She reports that she does not drink alcohol or use drugs.  PHYSICAL EXAM: Blood pressure (!) 149/71, pulse 64, temperature 98.2 F (36.8 C), temperature source Oral, resp. rate 16, SpO2  93 %.  General appearance :Awake, alert, not in any distress. Speech Clear. Not toxic Looking Eyes:, pupils equally reactive to light and accomodation,no scleral icterus.Pink conjunctiva HEENT: Atraumatic and Normocephalic Neck: supple, no JVD. No cervical lymphadenopathy. No thyromegaly Resp:Good air entry bilaterally, no added sounds  CVS: S1 S2 regular, no murmurs.  GI: Bowel sounds present, Non tender and not distended with no gaurding, rigidity or rebound.No organomegaly Extremities: B/L Lower Ext shows no edema, both legs are warm to touch Neurology:  speech clear,Non focal, sensation is grossly intact. Psychiatric: Normal judgment and insight. Alert and oriented x 3. Normal mood. Musculoskeletal:gait appears to be normal.No digital cyanosis Skin:No Rash, warm and dry Wounds:N/A  LABS ON ADMISSION:  I have personally reviewed  following labs and imaging studies  CBC: No results for input(s): WBC, NEUTROABS, HGB, HCT, MCV, PLT in the last 168 hours.  Basic Metabolic Panel: No results for input(s): NA, K, CL, CO2, GLUCOSE, BUN, CREATININE, CALCIUM, MG, PHOS in the last 168 hours.  GFR: CrCl cannot be calculated (Patient's most recent lab result is older than the maximum 21 days allowed.).  Liver Function Tests: No results for input(s): AST, ALT, ALKPHOS, BILITOT, PROT, ALBUMIN in the last 168 hours. No results for input(s): LIPASE, AMYLASE in the last 168 hours. No results for input(s): AMMONIA in the last 168 hours.  Coagulation Profile: No results for input(s): INR, PROTIME in the last 168 hours.  Cardiac Enzymes: No results for input(s): CKTOTAL, CKMB, CKMBINDEX, TROPONINI in the last 168 hours.  BNP (last 3 results) No results for input(s): PROBNP in the last 8760 hours.  HbA1C: No results for input(s): HGBA1C in the last 72 hours.  CBG: No results for input(s): GLUCAP in the last 168 hours.  Lipid Profile: No results for input(s): CHOL, HDL, LDLCALC, TRIG, CHOLHDL, LDLDIRECT in the last 72 hours.  Thyroid Function Tests: No results for input(s): TSH, T4TOTAL, FREET4, T3FREE, THYROIDAB in the last 72 hours.  Anemia Panel: No results for input(s): VITAMINB12, FOLATE, FERRITIN, TIBC, IRON, RETICCTPCT in the last 72 hours.  Urine analysis:    Component Value Date/Time   COLORURINE AMBER (A) 12/21/2015 1334   APPEARANCEUR CLOUDY (A) 12/21/2015 1334   LABSPEC 1.026 12/21/2015 1334   PHURINE 5.0 12/21/2015 1334   GLUCOSEU NEGATIVE 12/21/2015 1334   HGBUR NEGATIVE 12/21/2015 1334   BILIRUBINUR NEGATIVE 12/21/2015 1334   KETONESUR 5 (A) 12/21/2015 1334   PROTEINUR 30 (A) 12/21/2015 1334   UROBILINOGEN 0.2 09/03/2014 1649   NITRITE NEGATIVE 12/21/2015 1334   LEUKOCYTESUR NEGATIVE 12/21/2015 1334    Sepsis Labs: Lactic Acid, Venous    Component Value Date/Time   LATICACIDVEN 0.97  09/30/2015 2223     Microbiology: No results found for this or any previous visit (from the past 240 hour(s)).    RADIOLOGIC STUDIES ON ADMISSION: Dg Chest 1 View  Result Date: 05/27/2016 CLINICAL DATA:  Hip fracture.  Fall. EXAM: CHEST 1 VIEW COMPARISON:  09/30/2015 FINDINGS: The heart size and mediastinal contours are within normal limits. Aortic atherosclerosis noted. Both lungs are clear. The visualized skeletal structures are unremarkable. IMPRESSION: No active disease. Aortic Atherosclerosis (ICD10-I70.0). Electronically Signed   By: Signa Kell M.D.   On: 05/27/2016 13:27   Dg Hip Unilat W Or Wo Pelvis 2-3 Views Left  Result Date: 05/27/2016 CLINICAL DATA:  Left hip pain since a fall today. Initial encounter. EXAM: DG HIP (WITH OR WITHOUT PELVIS) 2-3V LEFT COMPARISON:  None. FINDINGS: The patient has an acute  subcapital fracture of the left hip. No other acute bony or joint abnormality is identified. Joint spaces are unremarkable. IMPRESSION: Acute subcapital left hip fracture. Electronically Signed   By: Drusilla Kanner M.D.   On: 05/27/2016 13:25    I have personally reviewed images of Hip x-ray  EKG:  Personally reviewed.   ASSESSMENT AND PLAN: Left hip fracture: Following a mechanical fall, await orthopedic evaluation-being transferred to [facility] at the request of orthopedics. Although labs and x-rays are still pending, patient has good functional status-and probably does not require any further workup before proceeding with surgery. She is of moderate risk, this was discussed with both patient and daughter at bedside-both are agreeable to proceed with surgery accepting all risks. Per ED M.D., orthopedics tentatively planning on hip repair on 5/24-we will resume diet-and keep nothing by mouth.   Paroxysmal atrial fibrillation: Await twelve-lead EKG-heart sounds are regular on exam. Hold Xarelto-last dose was 5/23.  Hypertension: Continue losartan  Interstitial  cystitis: Continue usual home medication  History of dementia: Appears mild-continue Aricept  Await further lab data.  Further plan will depend as patient's clinical course evolves and further radiologic and laboratory data become available. Patient will be monitored closely.  Above noted plan was discussed with patient/spouse face to face at bedside, they were in agreement.   CONSULTS: None  DVT Prophylaxis: SCD's  Code Status: Full Code  Disposition Plan:  Discharge back home vs SNF possibly in 2-3 days, depending on clinical course  Admission status:  Inpatient  going tommedical floor  Total time spent  55 minutes.Greater than 50% of this time was spent in counseling, explanation of diagnosis, planning of further management, and coordination of care.  Jeoffrey Massed [facility] Pager [phone]  If 7PM-7AM, please contact night-coverage [email or web address] Password [facility] 05/27/2016, 3:25 PM

## 2016-05-28 ENCOUNTER — Inpatient Hospital Stay (HOSPITAL_COMMUNITY): Admission: RE | Admit: 2016-05-28 | Payer: Medicare Other | Source: Ambulatory Visit | Admitting: Orthopedic Surgery

## 2016-05-28 ENCOUNTER — Inpatient Hospital Stay (HOSPITAL_COMMUNITY): Payer: Medicare Other

## 2016-05-28 DIAGNOSIS — F0390 Unspecified dementia without behavioral disturbance: Secondary | ICD-10-CM

## 2016-05-28 DIAGNOSIS — S72012A Unspecified intracapsular fracture of left femur, initial encounter for closed fracture: Principal | ICD-10-CM

## 2016-05-28 LAB — CBC
HCT: 34.4 % — ABNORMAL LOW (ref 36.0–46.0)
Hemoglobin: 11.4 g/dL — ABNORMAL LOW (ref 12.0–15.0)
MCH: 29.9 pg (ref 26.0–34.0)
MCHC: 33.1 g/dL (ref 30.0–36.0)
MCV: 90.3 fL (ref 78.0–100.0)
PLATELETS: 152 10*3/uL (ref 150–400)
RBC: 3.81 MIL/uL — ABNORMAL LOW (ref 3.87–5.11)
RDW: 14.4 % (ref 11.5–15.5)
WBC: 6.8 10*3/uL (ref 4.0–10.5)

## 2016-05-28 LAB — URINALYSIS, ROUTINE W REFLEX MICROSCOPIC
Bilirubin Urine: NEGATIVE
GLUCOSE, UA: NEGATIVE mg/dL
HGB URINE DIPSTICK: NEGATIVE
KETONES UR: NEGATIVE mg/dL
Leukocytes, UA: NEGATIVE
Nitrite: NEGATIVE
PROTEIN: NEGATIVE mg/dL
Specific Gravity, Urine: 1.012 (ref 1.005–1.030)
pH: 6 (ref 5.0–8.0)

## 2016-05-28 LAB — BASIC METABOLIC PANEL
Anion gap: 6 (ref 5–15)
BUN: 12 mg/dL (ref 6–20)
CHLORIDE: 109 mmol/L (ref 101–111)
CO2: 23 mmol/L (ref 22–32)
CREATININE: 1.07 mg/dL — AB (ref 0.44–1.00)
Calcium: 8.6 mg/dL — ABNORMAL LOW (ref 8.9–10.3)
GFR calc Af Amer: 55 mL/min — ABNORMAL LOW (ref >60)
GFR calc non Af Amer: 47 mL/min — ABNORMAL LOW (ref >60)
GLUCOSE: 130 mg/dL — AB (ref 65–99)
Potassium: 4.1 mmol/L (ref 3.5–5.1)
Sodium: 138 mmol/L (ref 135–145)

## 2016-05-28 LAB — SURGICAL PCR SCREEN
MRSA, PCR: NEGATIVE
STAPHYLOCOCCUS AUREUS: NEGATIVE

## 2016-05-28 MED ORDER — CHLORHEXIDINE GLUCONATE 4 % EX LIQD
60.0000 mL | Freq: Once | CUTANEOUS | Status: AC
  Administered 2016-05-28: 4 via TOPICAL
  Filled 2016-05-28: qty 60

## 2016-05-28 MED ORDER — CHLORHEXIDINE GLUCONATE 4 % EX LIQD
60.0000 mL | Freq: Once | CUTANEOUS | Status: AC
  Administered 2016-05-29: 4 via TOPICAL
  Filled 2016-05-28: qty 15

## 2016-05-28 MED ORDER — CEFAZOLIN SODIUM-DEXTROSE 2-4 GM/100ML-% IV SOLN
2.0000 g | INTRAVENOUS | Status: AC
  Administered 2016-05-29: 2 g via INTRAVENOUS
  Filled 2016-05-28 (×2): qty 100

## 2016-05-28 NOTE — Progress Notes (Signed)
PROGRESS NOTE    Christine [name]  ZOX:[id number] DOB: December 16, 1933 DOA: 05/27/2016 PCP: Rodrigo Ran, MD     Brief Narrative:  83 yo female presented with the chief complain of left hip pain after a mechanical fall. Patient known to have mild dementia, paroxysmal atrial fibrillation and HTN. Patient tripped and fell, landing on her left side, developing severe pain on the left hip, unable to stand back on her feet. No head trauma or loss of consciousness. Before the fall patient physically active, able to walk about 800 yards with no difficulty. On the initial physical examination, blood pressure 149/71, with heart rate of 64, RR 16 and oxygen saturation 93%. Lungs clear to auscultation, rhythmic S1 and S2, soft abdomen, non focal. Imaging positive for left hip fracture. Admitted for further pain control and surgical evaluation.    Assessment & Plan:   Active Problems:   PAF (paroxysmal atrial fibrillation) (HCC)   Interstitial cystitis   Essential hypertension   Closed left hip fracture (HCC)   1. Left hip fracture. Will continue pain control with hydrocodone, dvt prophylaxis with scd. Patient medical fit for surgical procedure. Will continue gentle IV fluids.  2. HTN. Blood pressure control with losartan. Systolic 130 to 140.   3. Interstitial cystitis. Continue mirabegron and pentosan.   4. Dementia. No confusion or agitation, patient's family at the bedside, will continue as needed lorazepam and daily donepezil.   DVT prophylaxis: enoxaparin  Code Status: Full  Family Communication:  Disposition Plan: home vs snf  Consultants:   Orthopedic surgery      Procedures:      Antimicrobials:     Subjective: Patient with positive pain, worse with movement, improved with pain medications. Patient able to ambulate and do light physical work at home per her husband at the bedside. No nausea or vomiting. No chest pain or dyspnea.   Objective: Vitals:   05/28/16 0010  05/28/16 0425 05/28/16 0953 05/28/16 1302  BP: (!) 130/52 (!) 134/58 124/62 139/67  Pulse: 72 (!) 58 73 77  Resp: 18 18 18 16   Temp: 98.3 F (36.8 C) 98 F (36.7 C) 98.3 F (36.8 C) 100.2 F (37.9 C)  TempSrc: Oral Oral Oral Oral  SpO2: 92% 95% 96% 93%    Intake/Output Summary (Last 24 hours) at 05/28/16 1426 Last data filed at 05/28/16 1300  Gross per 24 hour  Intake              755 ml  Output             1150 ml  Net             -395 ml   There were no vitals filed for this visit.  Examination:  General exam: deconditioned E ENT: mild pallor, oral mucosa moist. No icterus Respiratory system: Clear to auscultation. Respiratory effort normal. No wheezing, rales or rhonchi.  Cardiovascular system: S1 & S2 heard, RRR. No JVD, murmurs, rubs, gallops or clicks. No pedal edema. Gastrointestinal system: Abdomen is nondistended, soft and nontender. No organomegaly or masses felt. Normal bowel sounds heard. Central nervous system: Alert and oriented. No focal neurological deficits. Extremities: Symmetric 5 x 5 power. Skin: No rashes, lesions or ulcers    Data Reviewed: I have personally reviewed following labs and imaging studies  CBC:  Recent Labs Lab 05/27/16 1524  WBC 8.7  NEUTROABS 6.7  HGB 12.3  HCT 36.3  MCV 90.5  PLT 174   Basic Metabolic Panel:  Recent  Labs Lab 05/27/16 1524  NA 139  K 3.7  CL 107  CO2 26  GLUCOSE 106*  BUN 15  CREATININE 0.93  CALCIUM 9.0   GFR: CrCl cannot be calculated (Unknown ideal weight.). Liver Function Tests: No results for input(s): AST, ALT, ALKPHOS, BILITOT, PROT, ALBUMIN in the last 168 hours. No results for input(s): LIPASE, AMYLASE in the last 168 hours. No results for input(s): AMMONIA in the last 168 hours. Coagulation Profile:  Recent Labs Lab 05/27/16 1524  INR 0.97   Cardiac Enzymes: No results for input(s): CKTOTAL, CKMB, CKMBINDEX, TROPONINI in the last 168 hours. BNP (last 3 results) No results for  input(s): PROBNP in the last 8760 hours. HbA1C: No results for input(s): HGBA1C in the last 72 hours. CBG: No results for input(s): GLUCAP in the last 168 hours. Lipid Profile: No results for input(s): CHOL, HDL, LDLCALC, TRIG, CHOLHDL, LDLDIRECT in the last 72 hours. Thyroid Function Tests: No results for input(s): TSH, T4TOTAL, FREET4, T3FREE, THYROIDAB in the last 72 hours. Anemia Panel: No results for input(s): VITAMINB12, FOLATE, FERRITIN, TIBC, IRON, RETICCTPCT in the last 72 hours. Sepsis Labs: No results for input(s): PROCALCITON, LATICACIDVEN in the last 168 hours.  Recent Results (from the past 240 hour(s))  Surgical pcr screen     Status: None   Collection Time: 05/27/16  9:47 PM  Result Value Ref Range Status   MRSA, PCR NEGATIVE NEGATIVE Final   Staphylococcus aureus NEGATIVE NEGATIVE Final    Comment:        The Xpert SA Assay (FDA approved for NASAL specimens in patients over 9 years of age), is one component of a comprehensive surveillance program.  Test performance has been validated by [facility] for patients greater than or equal to 72 year old. It is not intended to diagnose infection nor to guide or monitor treatment.          Radiology Studies: Dg Chest 1 View  Result Date: 05/27/2016 CLINICAL DATA:  Hip fracture.  Fall. EXAM: CHEST 1 VIEW COMPARISON:  09/30/2015 FINDINGS: The heart size and mediastinal contours are within normal limits. Aortic atherosclerosis noted. Both lungs are clear. The visualized skeletal structures are unremarkable. IMPRESSION: No active disease. Aortic Atherosclerosis (ICD10-I70.0). Electronically Signed   By: Signa Kell M.D.   On: 05/27/2016 13:27   Ct Head Wo Contrast  Result Date: 05/27/2016 CLINICAL DATA:  Patient is alert and oriented x4. She is being see post fall at home. Patient was playing with her grandchildren and fell. Patient is currently complaining left hip pain on movement. Pt. Unsure if she hit her  head EXAM: CT HEAD WITHOUT CONTRAST TECHNIQUE: Contiguous axial images were obtained from the base of the skull through the vertex without intravenous contrast. COMPARISON:  Brain MRI 12/21/2015 FINDINGS: Brain: No intracranial hemorrhage. No parenchymal contusion. No midline shift or mass effect. Basilar cisterns are patent. No skull base fracture. No fluid in the paranasal sinuses or mastoid air cells. Orbits are normal. There are periventricular and subcortical white matter hypodensities. Generalized cortical atrophy. Brain: No acute intracranial hemorrhage. No focal mass lesion. No CT evidence of acute infarction. No midline shift or mass effect. No hydrocephalus. Basilar cisterns are patent. Vascular: No hyperdense vessel or unexpected calcification. Skull: Normal. Negative for fracture or focal lesion. Sinuses/Orbits: Paranasal sinuses and mastoid air cells are clear. Orbits are clear. Other: None. IMPRESSION: 1. No acute intracranial findings. 2. Atrophy and white matter microvascular disease. Electronically Signed   By: Genevive Bi  M.D.   On: 05/27/2016 15:52   Dg Hip Unilat W Or Wo Pelvis 2-3 Views Left  Result Date: 05/27/2016 CLINICAL DATA:  Left hip pain since a fall today. Initial encounter. EXAM: DG HIP (WITH OR WITHOUT PELVIS) 2-3V LEFT COMPARISON:  None. FINDINGS: The patient has an acute subcapital fracture of the left hip. No other acute bony or joint abnormality is identified. Joint spaces are unremarkable. IMPRESSION: Acute subcapital left hip fracture. Electronically Signed   By: Drusilla Kanner M.D.   On: 05/27/2016 13:25        Scheduled Meds: . cholecalciferol  1,000 Units Oral Daily  . donepezil  5 mg Oral QHS  . hydrOXYzine  50 mg Oral QHS  . LORazepam  1-2 mg Oral QHS  . losartan  50 mg Oral q morning - 10a  . mirabegron ER  50 mg Oral Daily  . pantoprazole  40 mg Oral q morning - 10a  . pentosan polysulfate  200 mg Oral BID  . senna  1 tablet Oral BID  . vitamin  B-12  1,000 mcg Oral Daily   Continuous Infusions: . sodium chloride 50 mL/hr at 05/28/16 1050     LOS: 1 day        Alainna Stawicki Annett Gula, MD [facility] Pager [phone]  If 7PM-7AM, please contact night-coverage [email or web address] Password [email or web address] 05/28/2016, 2:26 PM

## 2016-05-28 NOTE — Clinical Social Work Note (Signed)
Clinical Social Work Assessment  Patient Details  Name: Christine [name] MRN: [id number] Date of Birth: 30-Sep-1933  Date of referral:  05/28/16               Reason for consult:  Facility Placement                Permission sought to share information with:  Chartered certified accountant granted to share information::  Yes, Verbal Permission Granted  Name::     Astronomer::  SNF  Relationship::  daughter  Contact Information:     Housing/Transportation Living arrangements for the past 2 months:  Single Family Home Source of Information:  Patient, Adult Children Patient Interpreter Needed:  None Criminal Activity/Legal Involvement Pertinent to Current Situation/Hospitalization:  No - Comment as needed Significant Relationships:  Adult Children, Other Family Members Lives with:  Self Do you feel safe going back to the place where you live?  No Need for family participation in patient care:  Yes (Comment)  Care giving concerns:  Patient resides home alone. Patient has good support system however no one can care for her after DC. Patient is unsafe to return home at this time.  Social Worker assessment / plan:  CSW met with patient and daughter at bedside. CSW explained her role. CSW discussed SNF plan and options at DC. Patient will have surgery tomorrow. Daughter indicated that patient will need SNF and wants to go to [facility] since it is close by her home. CSW received permission to send offers at appropriate time. FL2 will be completed once surgery completed. Passr obtained. Offers pending as surgery is tomorrow 05/29/16.  Employment status:  Retired Nurse, adult PT Recommendations:  [facility] / Referral to community resources:  [place]  Patient/Family's Response to care:  Patient and family appreciative of CSw following up on SNF placement prior to surgery. No issues or concerns at this  time.  Patient/Family's Understanding of and Emotional Response to Diagnosis, Current Treatment, and Prognosis:  Patient and family has good understanding of the diagnosis, current treatment and prognosis at DC. Family in agreement with SNF needs due to patient residing alone prior to hospitalization. No issues or concerns at this time.  Emotional Assessment Appearance:  Appears stated age Attitude/Demeanor/Rapport:   (Cooperative) Affect (typically observed):  Accepting, Appropriate Orientation:  Oriented to Self, Oriented to Place, Oriented to  Time, Oriented to Situation Alcohol / Substance use:  Not Applicable Psych involvement (Current and /or in the community):  No (Comment)  Discharge Needs  Concerns to be addressed:  Care Coordination Readmission within the last 30 days:    Current discharge risk:  Physical Impairment, Dependent with Mobility Barriers to Discharge:  No Barriers Identified   Normajean Baxter, LCSW 05/28/2016, 12:09 PM

## 2016-05-29 ENCOUNTER — Inpatient Hospital Stay (HOSPITAL_COMMUNITY): Payer: Medicare Other | Admitting: Anesthesiology

## 2016-05-29 ENCOUNTER — Encounter (HOSPITAL_COMMUNITY)
Admission: EM | Disposition: A | Discharge status: Skilled Nursing Facility | Payer: Self-pay | Source: Home / Self Care | Attending: Internal Medicine

## 2016-05-29 ENCOUNTER — Inpatient Hospital Stay (HOSPITAL_COMMUNITY): Payer: Medicare Other

## 2016-05-29 ENCOUNTER — Encounter (HOSPITAL_COMMUNITY): Payer: Self-pay | Admitting: Surgery

## 2016-05-29 DIAGNOSIS — M12552 Traumatic arthropathy, left hip: Secondary | ICD-10-CM

## 2016-05-29 DIAGNOSIS — S72002A Fracture of unspecified part of neck of left femur, initial encounter for closed fracture: Secondary | ICD-10-CM

## 2016-05-29 DIAGNOSIS — S72009A Fracture of unspecified part of neck of unspecified femur, initial encounter for closed fracture: Secondary | ICD-10-CM | POA: Diagnosis present

## 2016-05-29 HISTORY — PX: TOTAL HIP ARTHROPLASTY: SHX124

## 2016-05-29 LAB — BASIC METABOLIC PANEL
Anion gap: 9 (ref 5–15)
BUN: 10 mg/dL (ref 6–20)
CALCIUM: 8.7 mg/dL — AB (ref 8.9–10.3)
CO2: 22 mmol/L (ref 22–32)
CREATININE: 0.82 mg/dL (ref 0.44–1.00)
Chloride: 107 mmol/L (ref 101–111)
GFR calc non Af Amer: >60 mL/min (ref >60)
Glucose, Bld: 104 mg/dL — ABNORMAL HIGH (ref 65–99)
Potassium: 3.8 mmol/L (ref 3.5–5.1)
SODIUM: 138 mmol/L (ref 135–145)

## 2016-05-29 LAB — CBC WITH DIFFERENTIAL/PLATELET
BASOS PCT: 0 %
Basophils Absolute: 0 10*3/uL (ref 0.0–0.1)
Eosinophils Absolute: 0.1 10*3/uL (ref 0.0–0.7)
Eosinophils Relative: 1 %
HCT: 35.6 % — ABNORMAL LOW (ref 36.0–46.0)
Hemoglobin: 12.1 g/dL (ref 12.0–15.0)
Lymphocytes Relative: 12 %
Lymphs Abs: 0.9 10*3/uL (ref 0.7–4.0)
MCH: 30.3 pg (ref 26.0–34.0)
MCHC: 34 g/dL (ref 30.0–36.0)
MCV: 89.2 fL (ref 78.0–100.0)
MONO ABS: 1 10*3/uL (ref 0.1–1.0)
MONOS PCT: 13 %
Neutro Abs: 5.5 10*3/uL (ref 1.7–7.7)
Neutrophils Relative %: 74 %
PLATELETS: 149 10*3/uL — AB (ref 150–400)
RBC: 3.99 MIL/uL (ref 3.87–5.11)
RDW: 14.2 % (ref 11.5–15.5)
WBC: 7.5 10*3/uL (ref 4.0–10.5)

## 2016-05-29 SURGERY — ARTHROPLASTY, HIP, TOTAL, ANTERIOR APPROACH
Anesthesia: General | Laterality: Left

## 2016-05-29 MED ORDER — RIVAROXABAN 15 MG PO TABS
15.0000 mg | ORAL_TABLET | Freq: Every day | ORAL | Status: DC
  Administered 2016-05-30 – 2016-06-02 (×4): 15 mg via ORAL
  Filled 2016-05-29 (×4): qty 1

## 2016-05-29 MED ORDER — METOCLOPRAMIDE HCL 5 MG/ML IJ SOLN: 5.0000 mg | Freq: Three times a day (TID) | INTRAMUSCULAR | Status: DC | PRN

## 2016-05-29 MED ORDER — MENTHOL 3 MG MT LOZG: 1.0000 | LOZENGE | OROMUCOSAL | Status: DC | PRN

## 2016-05-29 MED ORDER — POTASSIUM CHLORIDE IN NACL 20-0.9 MEQ/L-% IV SOLN
INTRAVENOUS | Status: DC
  Administered 2016-05-29: 21:00:00 via INTRAVENOUS
  Filled 2016-05-29: qty 1000

## 2016-05-29 MED ORDER — LACTATED RINGERS IV SOLN
INTRAVENOUS | Status: DC
  Administered 2016-05-29 (×2): via INTRAVENOUS

## 2016-05-29 MED ORDER — HYDROCODONE-ACETAMINOPHEN 5-325 MG PO TABS
1.0000 | ORAL_TABLET | Freq: Four times a day (QID) | ORAL | Status: DC | PRN
  Administered 2016-05-29 – 2016-05-30 (×2): 1 via ORAL
  Administered 2016-05-30 – 2016-06-02 (×9): 2 via ORAL
  Filled 2016-05-29 (×4): qty 2
  Filled 2016-05-29: qty 1
  Filled 2016-05-29 (×4): qty 2
  Filled 2016-05-29: qty 1
  Filled 2016-05-29: qty 2

## 2016-05-29 MED ORDER — PHENYLEPHRINE HCL 10 MG/ML IJ SOLN
INTRAMUSCULAR | Status: DC | PRN
  Administered 2016-05-29 (×2): 80 ug via INTRAVENOUS

## 2016-05-29 MED ORDER — HYDROMORPHONE HCL 1 MG/ML IJ SOLN: 0.2500 mg | INTRAMUSCULAR | Status: DC | PRN

## 2016-05-29 MED ORDER — SODIUM CHLORIDE 0.9 % IV SOLN
2000.0000 mg | INTRAVENOUS | Status: AC
  Administered 2016-05-29: 2000 mg via TOPICAL
  Filled 2016-05-29: qty 20

## 2016-05-29 MED ORDER — SUGAMMADEX SODIUM 200 MG/2ML IV SOLN
INTRAVENOUS | Status: DC | PRN
  Administered 2016-05-29: 140 mg via INTRAVENOUS

## 2016-05-29 MED ORDER — CEFAZOLIN SODIUM-DEXTROSE 2-4 GM/100ML-% IV SOLN
2.0000 g | Freq: Four times a day (QID) | INTRAVENOUS | Status: AC
  Administered 2016-05-29 – 2016-05-30 (×2): 2 g via INTRAVENOUS
  Filled 2016-05-29 (×2): qty 100

## 2016-05-29 MED ORDER — ONDANSETRON HCL 4 MG/2ML IJ SOLN: 4.0000 mg | Freq: Four times a day (QID) | INTRAMUSCULAR | Status: DC | PRN

## 2016-05-29 MED ORDER — PROMETHAZINE HCL 25 MG/ML IJ SOLN
6.2500 mg | INTRAMUSCULAR | Status: DC | PRN
Start: 2016-05-29 — End: 2016-06-02

## 2016-05-29 MED ORDER — PHENOL 1.4 % MT LIQD: 1.0000 | OROMUCOSAL | Status: DC | PRN

## 2016-05-29 MED ORDER — MORPHINE SULFATE (PF) 4 MG/ML IV SOLN
0.5000 mg | INTRAVENOUS | Status: DC | PRN
  Administered 2016-05-30 (×4): 0.52 mg via INTRAVENOUS
  Filled 2016-05-29 (×4): qty 1

## 2016-05-29 MED ORDER — METOCLOPRAMIDE HCL 5 MG PO TABS: 5.0000 mg | ORAL_TABLET | Freq: Three times a day (TID) | ORAL | Status: DC | PRN

## 2016-05-29 MED ORDER — PROPOFOL 10 MG/ML IV BOLUS
INTRAVENOUS | Status: AC
  Filled 2016-05-29: qty 20

## 2016-05-29 MED ORDER — ONDANSETRON HCL 4 MG PO TABS: 4.0000 mg | ORAL_TABLET | Freq: Four times a day (QID) | ORAL | Status: DC | PRN

## 2016-05-29 MED ORDER — LIDOCAINE HCL (CARDIAC) 20 MG/ML IV SOLN
INTRAVENOUS | Status: DC | PRN
  Administered 2016-05-29: 60 mg via INTRATRACHEAL

## 2016-05-29 MED ORDER — ROCURONIUM BROMIDE 100 MG/10ML IV SOLN
INTRAVENOUS | Status: DC | PRN
  Administered 2016-05-29 (×2): 10 mg via INTRAVENOUS
  Administered 2016-05-29: 50 mg via INTRAVENOUS
  Administered 2016-05-29: 10 mg via INTRAVENOUS

## 2016-05-29 MED ORDER — PROPOFOL 10 MG/ML IV BOLUS
INTRAVENOUS | Status: DC | PRN
  Administered 2016-05-29: 80 mg via INTRAVENOUS

## 2016-05-29 MED ORDER — LABETALOL HCL 5 MG/ML IV SOLN
INTRAVENOUS | Status: DC | PRN
  Administered 2016-05-29: 5 mg via INTRAVENOUS

## 2016-05-29 MED ORDER — FENTANYL CITRATE (PF) 250 MCG/5ML IJ SOLN
INTRAMUSCULAR | Status: DC | PRN
  Administered 2016-05-29 (×2): 25 ug via INTRAVENOUS
  Administered 2016-05-29: 50 ug via INTRAVENOUS
  Administered 2016-05-29: 25 ug via INTRAVENOUS
  Administered 2016-05-29: 50 ug via INTRAVENOUS
  Administered 2016-05-29: 25 ug via INTRAVENOUS
  Administered 2016-05-29: 50 ug via INTRAVENOUS

## 2016-05-29 MED ORDER — ACETAMINOPHEN 650 MG RE SUPP: 650.0000 mg | Freq: Four times a day (QID) | RECTAL | Status: DC | PRN

## 2016-05-29 MED ORDER — FENTANYL CITRATE (PF) 250 MCG/5ML IJ SOLN
INTRAMUSCULAR | Status: AC
  Filled 2016-05-29: qty 5

## 2016-05-29 MED ORDER — ONDANSETRON HCL 4 MG/2ML IJ SOLN
INTRAMUSCULAR | Status: DC | PRN
  Administered 2016-05-29: 4 mg via INTRAVENOUS

## 2016-05-29 MED ORDER — ONDANSETRON HCL 4 MG/2ML IJ SOLN
INTRAMUSCULAR | Status: AC
  Filled 2016-05-29: qty 2

## 2016-05-29 MED ORDER — LIDOCAINE 2% (20 MG/ML) 5 ML SYRINGE
INTRAMUSCULAR | Status: AC
  Filled 2016-05-29: qty 5

## 2016-05-29 MED ORDER — ARTIFICIAL TEARS OPHTHALMIC OINT
TOPICAL_OINTMENT | OPHTHALMIC | Status: AC
  Filled 2016-05-29: qty 3.5

## 2016-05-29 MED ORDER — ACETAMINOPHEN 325 MG PO TABS
650.0000 mg | ORAL_TABLET | Freq: Four times a day (QID) | ORAL | Status: DC | PRN
  Administered 2016-06-01: 650 mg via ORAL
  Filled 2016-05-29: qty 2

## 2016-05-29 MED FILL — Fentanyl Citrate Preservative Free (PF) Inj 100 MCG/2ML: INTRAMUSCULAR | Qty: 2 | Status: AC

## 2016-05-29 SURGICAL SUPPLY — 47 items
BAG BILE T-TUBES STRL (MISCELLANEOUS) IMPLANT
BAG DECANTER FOR FLEXI CONT (MISCELLANEOUS) ×2 IMPLANT
BLADE CLIPPER SURG (BLADE) ×2 IMPLANT
BLADE SAW SGTL 18X1.27X75 (BLADE) ×2 IMPLANT
CAPT HIP TOTAL 2 ×2 IMPLANT
CELLS DAT CNTRL 66122 CELL SVR (MISCELLANEOUS) ×1 IMPLANT
COVER PERINEAL POST (MISCELLANEOUS) ×2 IMPLANT
COVER SURGICAL LIGHT HANDLE (MISCELLANEOUS) ×2 IMPLANT
DRAPE C-ARM 42X72 X-RAY (DRAPES) ×2 IMPLANT
DRAPE STERI IOBAN 125X83 (DRAPES) ×2 IMPLANT
DRAPE U-SHAPE 47X51 STRL (DRAPES) ×6 IMPLANT
DRSG AQUACEL AG ADV 3.5X10 (GAUZE/BANDAGES/DRESSINGS) ×2 IMPLANT
DURAPREP 26ML APPLICATOR (WOUND CARE) ×2 IMPLANT
ELECT BLADE 4.0 EZ CLEAN MEGAD (MISCELLANEOUS) ×2
ELECT REM PT RETURN 9FT ADLT (ELECTROSURGICAL) ×2
ELECTRODE BLDE 4.0 EZ CLN MEGD (MISCELLANEOUS) ×1 IMPLANT
ELECTRODE REM PT RTRN 9FT ADLT (ELECTROSURGICAL) ×1 IMPLANT
GLOVE BIOGEL PI IND STRL 8 (GLOVE) ×1 IMPLANT
GLOVE BIOGEL PI INDICATOR 8 (GLOVE) ×1
GLOVE BIOGEL PI ORTHO PRO SZ8 (GLOVE) ×2
GLOVE PI ORTHO PRO STRL SZ8 (GLOVE) ×2 IMPLANT
GOWN STRL REUS W/ TWL LRG LVL3 (GOWN DISPOSABLE) ×3 IMPLANT
GOWN STRL REUS W/TWL LRG LVL3 (GOWN DISPOSABLE) ×3
HANDPIECE INTERPULSE COAX TIP (DISPOSABLE) ×1
HOOD PEEL AWAY FLYTE STAYCOOL (MISCELLANEOUS) ×6 IMPLANT
KIT BASIN OR (CUSTOM PROCEDURE TRAY) ×2 IMPLANT
KIT ROOM TURNOVER OR (KITS) ×2 IMPLANT
NEEDLE HYPO 22GX1.5 SAFETY (NEEDLE) ×2 IMPLANT
NS IRRIG 1000ML POUR BTL (IV SOLUTION) ×2 IMPLANT
PACK TOTAL JOINT (CUSTOM PROCEDURE TRAY) ×2 IMPLANT
PAD ARMBOARD 7.5X6 YLW CONV (MISCELLANEOUS) ×2 IMPLANT
RTRCTR WOUND ALEXIS 18CM MED (MISCELLANEOUS) ×2
SET HNDPC FAN SPRY TIP SCT (DISPOSABLE) ×1 IMPLANT
STRIP CLOSURE SKIN 1/2X4 (GAUZE/BANDAGES/DRESSINGS) ×2 IMPLANT
SUT ETHIBOND NAB CT1 #1 30IN (SUTURE) ×4 IMPLANT
SUT MNCRL AB 3-0 PS2 18 (SUTURE) ×2 IMPLANT
SUT VIC AB 0 CT1 27 (SUTURE) ×4
SUT VIC AB 0 CT1 27XBRD ANBCTR (SUTURE) ×4 IMPLANT
SUT VIC AB 1 CT1 27 (SUTURE) ×6
SUT VIC AB 1 CT1 27XBRD ANBCTR (SUTURE) ×6 IMPLANT
SUT VIC AB 2-0 CT1 27 (SUTURE) ×2
SUT VIC AB 2-0 CT1 TAPERPNT 27 (SUTURE) ×2 IMPLANT
SYR 30ML LL (SYRINGE) ×2 IMPLANT
TOWEL OR 17X24 6PK STRL BLUE (TOWEL DISPOSABLE) ×2 IMPLANT
TOWEL OR 17X26 10 PK STRL BLUE (TOWEL DISPOSABLE) ×2 IMPLANT
TRAY FOLEY W/METER SILVER 16FR (SET/KITS/TRAYS/PACK) ×2 IMPLANT
WATER STERILE IRR 1000ML POUR (IV SOLUTION) ×2 IMPLANT

## 2016-05-29 NOTE — Brief Op Note (Signed)
05/27/2016 - 05/29/2016  6:22 PM  PATIENT:  Christine [name]  83 y.o. female  PRE-OPERATIVE DIAGNOSIS:  LEFT HIP FRACTURE  POST-OPERATIVE DIAGNOSIS:  LEFT HIP FRACTURE  PROCEDURE:  Procedure(s): TOTAL HIP ARTHROPLASTY ANTERIOR APPROACH  SURGEON:  Surgeon(s): Meredith Pel, MD  ASSISTANT: Laure Kidney rnfa  ANESTHESIA:   general  EBL: 250 ml    Total I/O In: 1400 [I.V.:1400] Out: 1000 [Urine:800; Blood:200]  BLOOD ADMINISTERED: none  DRAINS: none   LOCAL MEDICATIONS USED:  none  SPECIMEN:  No Specimen  COUNTS:  YES  TOURNIQUET:  * No tourniquets in log *  DICTATION: .Other Dictation: Dictation Number [id number]  PLAN OF CARE: Admit to inpatient   PATIENT DISPOSITION:  PACU - hemodynamically stable

## 2016-05-29 NOTE — Anesthesia Procedure Notes (Signed)
Procedure Name: Intubation Date/Time: 05/29/2016 3:33 PM Performed by: Trixie Deis A Pre-anesthesia Checklist: Patient identified, Emergency Drugs available, Suction available and Patient being monitored Patient Re-evaluated:Patient Re-evaluated prior to inductionOxygen Delivery Method: Circle System Utilized Preoxygenation: Pre-oxygenation with 100% oxygen Intubation Type: IV induction Ventilation: Mask ventilation without difficulty Laryngoscope Size: Mac and 3 Grade View: Grade II Tube type: Oral Tube size: 7.0 mm Number of attempts: 1 Airway Equipment and Method: Stylet and Oral airway Placement Confirmation: ETT inserted through vocal cords under direct vision,  positive ETCO2 and breath sounds checked- equal and bilateral Secured at: 21 cm Tube secured with: Tape Dental Injury: Teeth and Oropharynx as per pre-operative assessment

## 2016-05-29 NOTE — Progress Notes (Addendum)
PROGRESS NOTE    Christine [name]  ZHY:[id number] DOB: 1933-07-21 DOA: 05/27/2016 PCP: Rodrigo Ran, MD    Brief Narrative:  83 yo female presented with the chief complain of left hip pain after a mechanical fall. Patient known to have mild dementia, paroxysmal atrial fibrillation and HTN. Patient tripped and fell, landing on her left side, developing severe pain on the left hip, unable to stand back on her feet. No head trauma or loss of consciousness. Before the fall patient physically active, able to walk about 800 yards with no difficulty. On the initial physical examination, blood pressure 149/71, with heart rate of 64, RR 16 and oxygen saturation 93%. Lungs clear to auscultation, rhythmic S1 and S2, soft abdomen, non focal. Imaging positive for left hip fracture. Admitted for further pain control and surgical evaluation. For surgical intervention 05/25.    Assessment & Plan:   Active Problems:   PAF (paroxysmal atrial fibrillation) (HCC)   Interstitial cystitis   Essential hypertension   Closed left hip fracture (HCC)   1. Left hip fracture. Pain control with hydrocodone, dvt prophylaxis with scd. Patient will go to surgical procedure today, will follow on post op recommendations from orthopedics.  2. HTN. Continue losartan 50 mg daily. Systolic 120 to 160.   3. Interstitial cystitis. On mirabegron and pentosan.   4. Dementia. As needed lorazepam and daily donepezil.   5. Paroxysmal atrial fibrillation. EKG personally reviewed noted sinus rhythm. Will continue to hold on rivaroxaban due to surgical procedure, with resumption of anticoagulation when possible.   DVT prophylaxis: enoxaparin  Code Status: Full  Family Communication: Patient's family at the bedside and all questions were addressed.  Disposition Plan: home vs snf  Consultants:   Orthopedic surgery      Procedures:      Antimicrobials:    Procedures:   Antimicrobials:    Subjective: Left  lower extremity pain remained well controlled overnight, no nausea or vomiting, no chest pain or dyspnea. No confusion or agitation.   Objective: Vitals:   05/28/16 0953 05/28/16 1302 05/28/16 2009 05/29/16 0420  BP: 124/62 139/67 124/60 (!) 161/70  Pulse: 73 77 83 88  Resp: 18 16 18 18   Temp: 98.3 F (36.8 C) 100.2 F (37.9 C) 100 F (37.8 C) 100.1 F (37.8 C)  TempSrc: Oral Oral Oral Oral  SpO2: 96% 93% 95% 96%    Intake/Output Summary (Last 24 hours) at 05/29/16 0839 Last data filed at 05/29/16 0500  Gross per 24 hour  Intake          1903.33 ml  Output             1850 ml  Net            53.33 ml   There were no vitals filed for this visit.  Examination:  General exam: not in pain or dyspnea E ENT: mild pallor, no icterus.  Respiratory system: Clear to auscultation. Respiratory effort normal. No wheezing, rales or rhonchi.  Cardiovascular system: S1 & S2 heard, RRR. No JVD, murmurs, rubs, gallops or clicks. No pedal edema. Gastrointestinal system: Abdomen is nondistended, soft and nontender. No organomegaly or masses felt. Normal bowel sounds heard. Central nervous system: Alert and oriented. No focal neurological deficits. Extremities: Symmetric 5 x 5 power. Skin: No rashes, lesions or ulcers     Data Reviewed: I have personally reviewed following labs and imaging studies  CBC:  Recent Labs Lab 05/27/16 1524 05/28/16 2036 05/29/16 0630  WBC 8.7  6.8 7.5  NEUTROABS 6.7  --  5.5  HGB 12.3 11.4* 12.1  HCT 36.3 34.4* 35.6*  MCV 90.5 90.3 89.2  PLT 174 152 149*   Basic Metabolic Panel:  Recent Labs Lab 05/27/16 1524 05/28/16 2036 05/29/16 0630  NA 139 138 138  K 3.7 4.1 3.8  CL 107 109 107  CO2 26 23 22   GLUCOSE 106* 130* 104*  BUN 15 12 10   CREATININE 0.93 1.07* 0.82  CALCIUM 9.0 8.6* 8.7*   GFR: CrCl cannot be calculated (Unknown ideal weight.). Liver Function Tests: No results for input(s): AST, ALT, ALKPHOS, BILITOT, PROT, ALBUMIN in the  last 168 hours. No results for input(s): LIPASE, AMYLASE in the last 168 hours. No results for input(s): AMMONIA in the last 168 hours. Coagulation Profile:  Recent Labs Lab 05/27/16 1524  INR 0.97   Cardiac Enzymes: No results for input(s): CKTOTAL, CKMB, CKMBINDEX, TROPONINI in the last 168 hours. BNP (last 3 results) No results for input(s): PROBNP in the last 8760 hours. HbA1C: No results for input(s): HGBA1C in the last 72 hours. CBG: No results for input(s): GLUCAP in the last 168 hours. Lipid Profile: No results for input(s): CHOL, HDL, LDLCALC, TRIG, CHOLHDL, LDLDIRECT in the last 72 hours. Thyroid Function Tests: No results for input(s): TSH, T4TOTAL, FREET4, T3FREE, THYROIDAB in the last 72 hours. Anemia Panel: No results for input(s): VITAMINB12, FOLATE, FERRITIN, TIBC, IRON, RETICCTPCT in the last 72 hours. Sepsis Labs: No results for input(s): PROCALCITON, LATICACIDVEN in the last 168 hours.  Recent Results (from the past 240 hour(s))  Surgical pcr screen     Status: None   Collection Time: 05/27/16  9:47 PM  Result Value Ref Range Status   MRSA, PCR NEGATIVE NEGATIVE Final   Staphylococcus aureus NEGATIVE NEGATIVE Final    Comment:        The Xpert SA Assay (FDA approved for NASAL specimens in patients over 61 years of age), is one component of a comprehensive surveillance program.  Test performance has been validated by [facility] for patients greater than or equal to 38 year old. It is not intended to diagnose infection nor to guide or monitor treatment.          Radiology Studies: Dg Chest 1 View  Result Date: 05/27/2016 CLINICAL DATA:  Hip fracture.  Fall. EXAM: CHEST 1 VIEW COMPARISON:  09/30/2015 FINDINGS: The heart size and mediastinal contours are within normal limits. Aortic atherosclerosis noted. Both lungs are clear. The visualized skeletal structures are unremarkable. IMPRESSION: No active disease. Aortic Atherosclerosis (ICD10-I70.0).  Electronically Signed   By: Signa Kell M.D.   On: 05/27/2016 13:27   Dg Chest 2 View  Result Date: 05/28/2016 CLINICAL DATA:  Broken hip scheduled for surgery EXAM: CHEST  2 VIEW COMPARISON:  05/27/2016 FINDINGS: Tiny left pleural effusion. No focal consolidation. Stable cardiomediastinal silhouette. No pneumothorax. IMPRESSION: Tiny left pleural effusion.  No infiltrate or edema. Electronically Signed   By: Jasmine Pang M.D.   On: 05/28/2016 20:58   Ct Head Wo Contrast  Result Date: 05/27/2016 CLINICAL DATA:  Patient is alert and oriented x4. She is being see post fall at home. Patient was playing with her grandchildren and fell. Patient is currently complaining left hip pain on movement. Pt. Unsure if she hit her head EXAM: CT HEAD WITHOUT CONTRAST TECHNIQUE: Contiguous axial images were obtained from the base of the skull through the vertex without intravenous contrast. COMPARISON:  Brain MRI 12/21/2015 FINDINGS: Brain: No intracranial  hemorrhage. No parenchymal contusion. No midline shift or mass effect. Basilar cisterns are patent. No skull base fracture. No fluid in the paranasal sinuses or mastoid air cells. Orbits are normal. There are periventricular and subcortical white matter hypodensities. Generalized cortical atrophy. Brain: No acute intracranial hemorrhage. No focal mass lesion. No CT evidence of acute infarction. No midline shift or mass effect. No hydrocephalus. Basilar cisterns are patent. Vascular: No hyperdense vessel or unexpected calcification. Skull: Normal. Negative for fracture or focal lesion. Sinuses/Orbits: Paranasal sinuses and mastoid air cells are clear. Orbits are clear. Other: None. IMPRESSION: 1. No acute intracranial findings. 2. Atrophy and white matter microvascular disease. Electronically Signed   By: Genevive Bi M.D.   On: 05/27/2016 15:52   Dg Hip Unilat W Or Wo Pelvis 2-3 Views Left  Result Date: 05/27/2016 CLINICAL DATA:  Left hip pain since a fall  today. Initial encounter. EXAM: DG HIP (WITH OR WITHOUT PELVIS) 2-3V LEFT COMPARISON:  None. FINDINGS: The patient has an acute subcapital fracture of the left hip. No other acute bony or joint abnormality is identified. Joint spaces are unremarkable. IMPRESSION: Acute subcapital left hip fracture. Electronically Signed   By: Drusilla Kanner M.D.   On: 05/27/2016 13:25        Scheduled Meds: . chlorhexidine  60 mL Topical Once  . cholecalciferol  1,000 Units Oral Daily  . donepezil  5 mg Oral QHS  . hydrOXYzine  50 mg Oral QHS  . LORazepam  1-2 mg Oral QHS  . losartan  50 mg Oral q morning - 10a  . mirabegron ER  50 mg Oral Daily  . pantoprazole  40 mg Oral q morning - 10a  . pentosan polysulfate  200 mg Oral BID  . senna  1 tablet Oral BID  . vitamin B-12  1,000 mcg Oral Daily   Continuous Infusions: . sodium chloride 50 mL/hr at 05/29/16 0500  .  ceFAZolin (ANCEF) IV       LOS: 2 days      Aksel Bencomo Annett Gula, MD [facility] Pager [phone]  If 7PM-7AM, please contact night-coverage [email or web address] Password [facility] 05/29/2016, 8:39 AM

## 2016-05-29 NOTE — Anesthesia Postprocedure Evaluation (Signed)
Anesthesia Post Note  Patient: Christine [name]  Procedure(s) Performed: Procedure(s) (LRB): TOTAL HIP ARTHROPLASTY ANTERIOR APPROACH (Left)  Patient location during evaluation: PACU Anesthesia Type: General Level of consciousness: awake and alert, patient cooperative and oriented Pain management: pain level controlled Vital Signs Assessment: post-procedure vital signs reviewed and stable Respiratory status: spontaneous breathing, nonlabored ventilation, respiratory function stable and patient connected to nasal cannula oxygen Cardiovascular status: blood pressure returned to baseline and stable Postop Assessment: no signs of nausea or vomiting Anesthetic complications: no       Last Vitals:  Vitals:   05/29/16 1923 05/29/16 1934  BP: (!) 151/64 (!) 140/52  Pulse: 74 81  Resp: 17 16  Temp:  37 C    Last Pain:  Vitals:   05/29/16 1934  TempSrc: Oral  PainSc:                  Shayden Bobier,E. Haleigh Desmith

## 2016-05-29 NOTE — Transfer of Care (Signed)
Immediate Anesthesia Transfer of Care Note  Patient: Christine [name]  Procedure(s) Performed: Procedure(s): TOTAL HIP ARTHROPLASTY ANTERIOR APPROACH (Left)  Patient Location: PACU  Anesthesia Type:General  Level of Consciousness: awake, alert  and oriented  Airway & Oxygen Therapy: Patient Spontanous Breathing and Patient connected to nasal cannula oxygen  Post-op Assessment: Report given to RN, Post -op Vital signs reviewed and stable and Patient moving all extremities  Post vital signs: Reviewed and stable  Last Vitals:  Vitals:   05/29/16 0420 05/29/16 1831  BP: (!) 161/70 (P) 115/61  Pulse: 88 (P) 73  Resp: 18 (P) 14  Temp: 37.8 C (P) 36.4 C    Last Pain:  Vitals:   05/29/16 0707  TempSrc:   PainSc: Asleep      Patients Stated Pain Goal: 3 (49/67/59 1638)  Complications: No apparent anesthesia complications

## 2016-05-29 NOTE — Anesthesia Preprocedure Evaluation (Addendum)
Anesthesia Evaluation  Patient identified by MRN, date of birth, ID band Patient awake    Reviewed: Allergy & Precautions, NPO status , Patient's Chart, lab work & pertinent test results  Airway Mallampati: II  TM Distance: >3 FB Neck ROM: Full    Dental no notable dental hx.    Pulmonary neg pulmonary ROS,    Pulmonary exam normal breath sounds clear to auscultation       Cardiovascular hypertension, Normal cardiovascular exam+ dysrhythmias Atrial Fibrillation  Rhythm:Regular Rate:Normal  H/O PAF   Neuro/Psych TIAnegative psych ROS   GI/Hepatic negative GI ROS, Neg liver ROS,   Endo/Other  negative endocrine ROS  Renal/GU negative Renal ROS  negative genitourinary   Musculoskeletal negative musculoskeletal ROS (+)   Abdominal   Peds negative pediatric ROS (+)  Hematology negative hematology ROS (+)   Anesthesia Other Findings   Reproductive/Obstetrics negative OB ROS                             Anesthesia Physical Anesthesia Plan  ASA: II  Anesthesia Plan: General   Post-op Pain Management:    Induction: Intravenous  Airway Management Planned: Oral ETT  Additional Equipment:   Intra-op Plan:   Post-operative Plan: Extubation in OR  Informed Consent: I have reviewed the patients History and Physical, chart, labs and discussed the procedure including the risks, benefits and alternatives for the proposed anesthesia with the patient or authorized representative who has indicated his/her understanding and acceptance.   Dental advisory given  Plan Discussed with: CRNA and Surgeon  Anesthesia Plan Comments:         Anesthesia Quick Evaluation

## 2016-05-30 LAB — CBC WITH DIFFERENTIAL/PLATELET
BASOS ABS: 0 10*3/uL (ref 0.0–0.1)
Basophils Relative: 0 %
EOS PCT: 0 %
Eosinophils Absolute: 0 10*3/uL (ref 0.0–0.7)
HEMATOCRIT: 30.6 % — AB (ref 36.0–46.0)
Hemoglobin: 10 g/dL — ABNORMAL LOW (ref 12.0–15.0)
LYMPHS ABS: 0.8 10*3/uL (ref 0.7–4.0)
LYMPHS PCT: 10 %
MCH: 29.4 pg (ref 26.0–34.0)
MCHC: 32.7 g/dL (ref 30.0–36.0)
MCV: 90 fL (ref 78.0–100.0)
MONO ABS: 1.1 10*3/uL — AB (ref 0.1–1.0)
Monocytes Relative: 14 %
NEUTROS ABS: 6.2 10*3/uL (ref 1.7–7.7)
Neutrophils Relative %: 76 %
PLATELETS: 140 10*3/uL — AB (ref 150–400)
RBC: 3.4 MIL/uL — AB (ref 3.87–5.11)
RDW: 14.4 % (ref 11.5–15.5)
WBC: 8.1 10*3/uL (ref 4.0–10.5)

## 2016-05-30 LAB — BASIC METABOLIC PANEL
Anion gap: 8 (ref 5–15)
BUN: 14 mg/dL (ref 6–20)
CALCIUM: 7.8 mg/dL — AB (ref 8.9–10.3)
CHLORIDE: 106 mmol/L (ref 101–111)
CO2: 22 mmol/L (ref 22–32)
Creatinine, Ser: 0.95 mg/dL (ref 0.44–1.00)
GFR calc Af Amer: >60 mL/min (ref >60)
GFR calc non Af Amer: 55 mL/min — ABNORMAL LOW (ref >60)
Glucose, Bld: 135 mg/dL — ABNORMAL HIGH (ref 65–99)
Potassium: 4.3 mmol/L (ref 3.5–5.1)
Sodium: 136 mmol/L (ref 135–145)

## 2016-05-30 LAB — PROTIME-INR
INR: 1.24
Prothrombin Time: 15.7 seconds — ABNORMAL HIGH (ref 11.4–15.2)

## 2016-05-30 NOTE — Progress Notes (Signed)
PROGRESS NOTE    Christine [name]  UEA:[id number] DOB: 31-Jan-1933 DOA: 05/27/2016 PCP: Rodrigo Ran, MD    Brief Narrative:  83 yo female presented with the chief complain of left hip pain after a mechanical fall. Patient known to have mild dementia, paroxysmal atrial fibrillation and HTN. Patient tripped and fell, landing on her left side, developing severe pain on the left hip, unable to stand back on her feet. No head trauma or loss of consciousness. Before the fall patient physically active, able to walk about 800 yards with no difficulty. On the initial physical examination, blood pressure 149/71, with heart rate of 64, RR 16 and oxygen saturation 93%. Lungs clear to auscultation, rhythmic S1 and S2, soft abdomen, non focal. Imaging positive for left hip fracture. Admitted for further pain control and surgical evaluation. Surgical intervention 05/25. LEFT TOTAL HIP ARTHROPLASTY ANTERIOR APPROACH.    Assessment & Plan:   Active Problems:   PAF (paroxysmal atrial fibrillation) (HCC)   Interstitial cystitis   Essential hypertension   Closed left hip fracture (HCC)   Hip fracture (HCC)   1. Left hip fracture. Continue hydrocodone and IV hydromorphone/ morphine  for pain control. Physical therapy evaluation, follow on orthopedic recommendations. Bowel regimen with senna.   2. HTN.  On losartan 50 mg daily. Systolic 120 to 150.   3. Interstitial cystitis. Continue on home regimen with mirabegron and pentosan.   4. Dementia. Tolerating well lorazepam prn and daily donepezil.   5. Paroxysmal atrial fibrillation. Patient back on anticoagulation with rivaroxaban.   DVT prophylaxis:enoxaparin  Code Status:Full  Family Communication:Patient's family at the bedside and all questions were addressed.  Disposition Plan:home vs snf  Consultants:  Orthopedic surgery     Procedures:  LEFT TOTAL HIP ARTHROPLASTY ANTERIOR APPROACH.    Antimicrobials:      Subjective: Patient with pain at the left lower extremity, worse with movement and improved with analgesics, no nausea or vomiting. No dyspnea or chest pain.   Objective: Vitals:   05/29/16 2028 05/29/16 2200 05/30/16 0029 05/30/16 0522  BP: (!) 142/63 (!) 143/72 117/82 (!) 157/80  Pulse: 74 (!) 104 83 98  Resp: 18  18 18   Temp: 99.1 F (37.3 C) 98.7 F (37.1 C) 98.9 F (37.2 C) 99 F (37.2 C)  TempSrc: Oral Oral Oral Oral  SpO2: 98% 97% 92% 96%  Weight:      Height:        Intake/Output Summary (Last 24 hours) at 05/30/16 4782 Last data filed at 05/30/16 9562  Gross per 24 hour  Intake          2750.42 ml  Output             1400 ml  Net          1350.42 ml   Filed Weights   05/29/16 0900  Weight: 67.1 kg (148 lb)    Examination:  General exam: Not in pain or dyspnea E ENT: no pallor or icterus, oral mucosa moist. Respiratory system: Clear to auscultation. Respiratory effort normal. No wheezing, rales or rhonchi.  Cardiovascular system: S1 & S2 heard, RRR. No JVD, murmurs, rubs, gallops or clicks. No pedal edema. Gastrointestinal system: Abdomen is nondistended, soft and nontender. No organomegaly or masses felt. Normal bowel sounds heard. Central nervous system: Alert and oriented. No focal neurological deficits. Extremities: Symmetric 5 x 5 power. Skin: No rashes, lesions or ulcers.     Data Reviewed: I have personally reviewed following labs and imaging  studies  CBC:  Recent Labs Lab 05/27/16 1524 05/28/16 2036 05/29/16 0630 05/30/16 0343  WBC 8.7 6.8 7.5 8.1  NEUTROABS 6.7  --  5.5 6.2  HGB 12.3 11.4* 12.1 10.0*  HCT 36.3 34.4* 35.6* 30.6*  MCV 90.5 90.3 89.2 90.0  PLT 174 152 149* 140*   Basic Metabolic Panel:  Recent Labs Lab 05/27/16 1524 05/28/16 2036 05/29/16 0630 05/30/16 0343  NA 139 138 138 136  K 3.7 4.1 3.8 4.3  CL 107 109 107 106  CO2 26 23 22 22   GLUCOSE 106* 130* 104* 135*  BUN 15 12 10 14   CREATININE 0.93 1.07*  0.82 0.95  CALCIUM 9.0 8.6* 8.7* 7.8*   GFR: Estimated Creatinine Clearance: 43.8 mL/min (by C-G formula based on SCr of 0.95 mg/dL). Liver Function Tests: No results for input(s): AST, ALT, ALKPHOS, BILITOT, PROT, ALBUMIN in the last 168 hours. No results for input(s): LIPASE, AMYLASE in the last 168 hours. No results for input(s): AMMONIA in the last 168 hours. Coagulation Profile:  Recent Labs Lab 05/27/16 1524 05/30/16 0343  INR 0.97 1.24   Cardiac Enzymes: No results for input(s): CKTOTAL, CKMB, CKMBINDEX, TROPONINI in the last 168 hours. BNP (last 3 results) No results for input(s): PROBNP in the last 8760 hours. HbA1C: No results for input(s): HGBA1C in the last 72 hours. CBG: No results for input(s): GLUCAP in the last 168 hours. Lipid Profile: No results for input(s): CHOL, HDL, LDLCALC, TRIG, CHOLHDL, LDLDIRECT in the last 72 hours. Thyroid Function Tests: No results for input(s): TSH, T4TOTAL, FREET4, T3FREE, THYROIDAB in the last 72 hours. Anemia Panel: No results for input(s): VITAMINB12, FOLATE, FERRITIN, TIBC, IRON, RETICCTPCT in the last 72 hours. Sepsis Labs: No results for input(s): PROCALCITON, LATICACIDVEN in the last 168 hours.  Recent Results (from the past 240 hour(s))  Surgical pcr screen     Status: None   Collection Time: 05/27/16  9:47 PM  Result Value Ref Range Status   MRSA, PCR NEGATIVE NEGATIVE Final   Staphylococcus aureus NEGATIVE NEGATIVE Final    Comment:        The Xpert SA Assay (FDA approved for NASAL specimens in patients over 37 years of age), is one component of a comprehensive surveillance program.  Test performance has been validated by [facility] for patients greater than or equal to 84 year old. It is not intended to diagnose infection nor to guide or monitor treatment.   Urine culture     Status: Abnormal (Preliminary result)   Collection Time: 05/28/16 10:24 PM  Result Value Ref Range Status   Specimen Description  URINE, CATHETERIZED  Final   Special Requests NONE  Final   Culture 80,000 COLONIES/mL UNIDENTIFIED ORGANISM (A)  Final   Report Status PENDING  Incomplete         Radiology Studies: Dg Chest 2 View  Result Date: 05/28/2016 CLINICAL DATA:  Broken hip scheduled for surgery EXAM: CHEST  2 VIEW COMPARISON:  05/27/2016 FINDINGS: Tiny left pleural effusion. No focal consolidation. Stable cardiomediastinal silhouette. No pneumothorax. IMPRESSION: Tiny left pleural effusion.  No infiltrate or edema. Electronically Signed   By: Jasmine Pang M.D.   On: 05/28/2016 20:58   Pelvis Portable  Result Date: 05/29/2016 CLINICAL DATA:  Postoperative radiograph, status post left hip arthroplasty. Initial encounter. EXAM: PORTABLE PELVIS 1-2 VIEWS COMPARISON:  Left hip radiographs performed earlier today at 3:49 p.m. FINDINGS: The patient's left total hip arthroplasty is grossly unremarkable in appearance, without evidence of loosening.  No new fractures are seen. The right hip joint is grossly unremarkable. Mild sclerosis is noted at the sacroiliac joints. Postoperative change is noted about the left hip. The visualized bowel gas pattern is grossly unremarkable. IMPRESSION: Left total hip arthroplasty is grossly unremarkable in appearance, without evidence of loosening. Electronically Signed   By: Roanna Raider M.D.   On: 05/29/2016 19:06   Dg C-arm 61-120 Min  Result Date: 05/29/2016 CLINICAL DATA:  Left total hip arthroplasty.  Initial encounter. EXAM: OPERATIVE LEFT HIP WITH PELVIS COMPARISON:  Left hip radiographs performed 05/27/2016 FINDINGS: The patient is status post left total hip arthroplasty. There is no evidence of loosening or new fracture. Overlying postoperative soft tissue air is noted. IMPRESSION: Status post left total hip arthroplasty, without evidence of loosening or new fracture. Electronically Signed   By: Roanna Raider M.D.   On: 05/29/2016 18:22   Dg Hip Operative Unilat W Or W/o Pelvis  Left  Result Date: 05/29/2016 CLINICAL DATA:  Left total hip arthroplasty.  Initial encounter. EXAM: OPERATIVE LEFT HIP WITH PELVIS COMPARISON:  Left hip radiographs performed 05/27/2016 FINDINGS: The patient is status post left total hip arthroplasty. There is no evidence of loosening or new fracture. Overlying postoperative soft tissue air is noted. IMPRESSION: Status post left total hip arthroplasty, without evidence of loosening or new fracture. Electronically Signed   By: Roanna Raider M.D.   On: 05/29/2016 18:22        Scheduled Meds: . cholecalciferol  1,000 Units Oral Daily  . donepezil  5 mg Oral QHS  . hydrOXYzine  50 mg Oral QHS  . LORazepam  1-2 mg Oral QHS  . losartan  50 mg Oral q morning - 10a  . mirabegron ER  50 mg Oral Daily  . pantoprazole  40 mg Oral q morning - 10a  . pentosan polysulfate  200 mg Oral BID  . rivaroxaban  15 mg Oral Daily  . senna  1 tablet Oral BID  . vitamin B-12  1,000 mcg Oral Daily   Continuous Infusions: . sodium chloride 50 mL/hr at 05/29/16 0500  . 0.9 % NaCl with KCl 20 mEq / L 75 mL/hr at 05/29/16 2103  . lactated ringers       LOS: 3 days     Kassadi Presswood Annett Gula, MD [facility] Pager [phone]  If 7PM-7AM, please contact night-coverage [email or web address] Password [email or web address] 05/30/2016, 9:27 AM

## 2016-05-30 NOTE — Evaluation (Signed)
Occupational Therapy Evaluation Patient Details Name: Christine [name] MRN: [id number] DOB: 09-06-1933 Today's Date: 05/30/2016    History of Present Illness Patient is S/P left anterior THA following a fall and sub capital fx. She was active and indepdenent prior to fall. PMH: HTN, GERD, TIA, osteopenia, PAF, arthritis, GERD,     Clinical Impression   PTA, pt was living alone and independent with ADLs, IADLs, and driving. Currently, pt requires Min A for functional mobility and Min-Mod for LB ADLs. Pt would benefit from acute OT to facilitate safe dc and increase pt's occupational performance. Recommend dc to SNF for further OT to increase independence and safety with ADLs and functional mobility.     Follow Up Recommendations  SNF;Supervision/Assistance - 24 hour    Equipment Recommendations  Other (comment) (Defer to next venue)    Recommendations for Other Services PT consult     Precautions / Restrictions Precautions Precautions: Fall Restrictions Weight Bearing Restrictions: Yes LLE Weight Bearing: Weight bearing as tolerated      Mobility Bed Mobility Overal bed mobility: Needs Assistance Bed Mobility: Sit to Supine;Supine to Sit     Supine to sit: Mod assist Sit to supine: Mod assist;+2 for safety/equipment   General bed mobility comments: Mod A to manage BLE and A with trunk elevation. Pt utilized bed rails and benefited from VCs for sequencing  Transfers Overall transfer level: Needs assistance   Transfers: Sit to/from [employer] Transfers Sit to Stand: Min assist Stand pivot transfers: Min assist       General transfer comment: Min A with Min verbal cuing  for hand placement and weight shift.     Balance Overall balance assessment: Needs assistance Sitting-balance support: Single extremity supported Sitting balance-Leahy Scale: Fair     Standing balance support: Bilateral upper extremity supported Standing balance-Leahy Scale: Poor Standing  balance comment: needed bilateral upper extremitys to remain sitting                            ADL either performed or assessed with clinical judgement   ADL Overall ADL's : Needs assistance/impaired Eating/Feeding: Set up;Sitting   Grooming: Set up;Sitting   Upper Body Bathing: Set up;Min guard;Sitting Upper Body Bathing Details (indicate cue type and reason): Pt performed UB bathing while seated on BSC Lower Body Bathing: Minimal assistance;Sit to/from stand   Upper Body Dressing : Set up;Supervision/safety;Sitting Upper Body Dressing Details (indicate cue type and reason): Donned new gown Lower Body Dressing: Moderate assistance;Sit to/from stand   Toilet Transfer: Minimal assistance;Cueing for sequencing;RW;BSC;Stand-pivot Toilet Transfer Details (indicate cue type and reason): Pt took several steps to stand and turn to [facility]. Required tactile cues for RW management and VCs to sequence steps Toileting- Clothing Manipulation and Hygiene: Minimal assistance;+2 for safety/equipment;Sit to/from stand (Daughter acted as +2) Lobbyist - Architect Details (indicate cue type and reason): Pt performed peri care while in standing     Functional mobility during ADLs: Minimal assistance;+2 for safety/equipment;Cueing for sequencing;Rolling walker General ADL Comments: Pt standing tolerance limited by pain. She was motivated to participate in functional tasks and her daughter acted as a +2 throughout Arboriculturist      Pertinent Vitals/Pain Pain Assessment: 0-10 Pain Score: 4  Faces Pain Scale: Hurts even more Pain Location: left hip  Pain Descriptors / Indicators: Aching Pain Intervention(s): Monitored during session;Limited  activity within patient's tolerance;Patient requesting pain meds-RN notified     Hand Dominance Right   Extremity/Trunk Assessment Upper Extremity Assessment Upper Extremity Assessment: Overall WFL  for tasks assessed   Lower Extremity Assessment Lower Extremity Assessment: Defer to PT evaluation LLE: Unable to fully assess due to pain       Communication Communication Communication: No difficulties   Cognition Arousal/Alertness: Awake/alert Behavior During Therapy: WFL for tasks assessed/performed;Impulsive Overall Cognitive Status: Within Functional Limits for tasks assessed                                 General Comments: required frequent cuing with simple tasks but had recently had dilaudid.    General Comments  Daughter present throughout session and acted as +2 for safety. Daughter wanted to be involved and was supportive    Exercises     Shoulder Instructions      Home Living Family/patient expects to be discharged to:: Private residence Living Arrangements: Alone Available Help at Discharge: Family;Available PRN/intermittently Type of Home: Mobile home Home Access: Stairs to enter Entrance Stairs-Number of Steps: 4 Entrance Stairs-Rails: Right;Left Home Layout: One level     Bathroom Shower/Tub: Tub only;Walk-in shower   Bathroom Toilet: Standard     Home Equipment: Environmental consultant - 2 wheels;Cane - single point   Additional Comments: Pt's children live close by - along the same road      Prior Functioning/Environment Level of Independence: Independent        Comments: ADLs, IADLs, driving. did not use DME prior        OT Problem List: Decreased strength;Decreased range of motion;Decreased activity tolerance;Impaired balance (sitting and/or standing);Decreased safety awareness;Decreased knowledge of use of DME or AE;Decreased knowledge of precautions;Pain      OT Treatment/Interventions: Self-care/ADL training;Therapeutic exercise;DME and/or AE instruction;Energy conservation;Therapeutic activities;Patient/family education    OT Goals(Current goals can be found in the care plan section) Acute Rehab OT Goals Patient Stated Goal:  unstated ADL Goals Pt Will Perform Grooming: with min guard assist;standing Pt Will Perform Upper Body Dressing: with set-up;with supervision;sitting Pt Will Perform Lower Body Dressing: with min assist;with adaptive equipment;sit to/from stand Pt Will Transfer to Toilet: with min guard assist;bedside commode;ambulating  OT Frequency: Min 2X/week   Barriers to D/C:            Co-evaluation              AM-PAC PT "6 Clicks" Daily Activity     Outcome Measure Help from another person eating meals?: None Help from another person taking care of personal grooming?: A Little Help from another person toileting, which includes using toliet, bedpan, or urinal?: A Little Help from another person bathing (including washing, rinsing, drying)?: A Lot Help from another person to put on and taking off regular upper body clothing?: None Help from another person to put on and taking off regular lower body clothing?: A Lot 6 Click Score: 18   End of Session Equipment Utilized During Treatment: Gait belt;Rolling walker Nurse Communication: Mobility status;Patient requests pain meds  Activity Tolerance: Patient tolerated treatment well;Patient limited by pain;Patient limited by fatigue Patient left: in bed;with call bell/phone within reach;with family/visitor present  OT Visit Diagnosis: Unsteadiness on feet (R26.81);Other abnormalities of gait and mobility (R26.89);Muscle weakness (generalized) (M62.81);Pain Pain - Right/Left: Left Pain - part of body: Hip;Leg                Time:  7829-5621 OT Time Calculation (min): 32 min Charges:  OT General Charges $OT Visit: 1 Procedure OT Evaluation $OT Eval Low Complexity: 1 Procedure OT Treatments $Self Care/Home Management : 8-22 mins G-Codes:     [employer], OTR/L [phone]  Theodoro Grist Orlyn Odonoghue 05/30/2016, 4:03 PM

## 2016-05-30 NOTE — Progress Notes (Signed)
Patient ID: Christine [name], female   DOB: 1933-12-30, 83 y.o.   MRN: [id number] Postoperative day 1 total hip arthroplasty for femoral neck fracture. Patient is alert and comfortable this morning. Plan for physical therapy weightbearing as tolerated.

## 2016-05-30 NOTE — Evaluation (Signed)
Physical Therapy Evaluation Patient Details Name: Christine [name] MRN: [id number] DOB: Nov 28, 1933 Today's Date: 05/30/2016   History of Present Illness  Patient is S/P left anterior THA following a fall and sub capital fx. She was active and indepdenent prior to fall. PMH: HTN, GERD, TIA, osteopenia, PAF, arthritis, GERD,    Clinical Impression  Patient presents with decreased ability to ambulate, decreased left hip strength, and pain/difficulty performing all transfers. She lives alone. She would benefit from skilled therapy to improve function and her ability to live on her own. She would benefit from rehab at a SNF to improve her ability to go home safely. Acute therapy will continue to follow the patient.     Follow Up Recommendations SNF    Equipment Recommendations  None recommended by PT    Recommendations for Other Services Rehab consult     Precautions / Restrictions Precautions Precautions: Fall Restrictions Weight Bearing Restrictions: Yes LLE Weight Bearing: Weight bearing as tolerated      Mobility  Bed Mobility Overal bed mobility: Needs Assistance Bed Mobility: Sit to Supine       Sit to supine: Mod assist   General bed mobility comments: mod a to get to the edge of the bed   Transfers Overall transfer level: Needs assistance   Transfers: Sit to/from Stand Sit to Stand: Min assist         General transfer comment: Min A Min verbal cuing  for hand placement and weight shift.   Ambulation/Gait Ambulation/Gait assistance: Min assist Ambulation Distance (Feet): 5 Feet Assistive device: Rolling walker (2 wheeled) Gait Pattern/deviations: Step-to pattern;Antalgic;Decreased stance time - left;Decreased weight shift to right   Gait velocity interpretation: Below normal speed for age/gender General Gait Details: required frequent cuing to move the walker and to stay in the medille of the walker. She was alos hesitant to lift her left leg and was advised  to to shuffle the leg.   Stairs            Wheelchair Mobility    Modified Rankin (Stroke Patients Only)       Balance Overall balance assessment: Needs assistance Sitting-balance support: Single extremity supported Sitting balance-Leahy Scale: Fair     Standing balance support: Bilateral upper extremity supported Standing balance-Leahy Scale: Poor Standing balance comment: needed bilateral upper extremitys to remain sitting                              Pertinent Vitals/Pain Pain Assessment: 0-10 Pain Score: 7  Pain Location: left hip  Pain Descriptors / Indicators: Aching Pain Intervention(s): Limited activity within patient's tolerance;Monitored during session;Premedicated before session;Utilized relaxation techniques    Home Living Family/patient expects to be discharged to:: Private residence Living Arrangements: Alone Available Help at Discharge: Family;Available PRN/intermittently Type of Home: Mobile home Home Access: Stairs to enter Entrance Stairs-Rails: Right;Left Entrance Stairs-Number of Steps: 4 Home Layout: One level Home Equipment: Walker - 2 wheels;Cane - single point Additional Comments: has equipment but does not use it per patient     Prior Function Level of Independence: Independent         Comments: drives and manages her home     Hand Dominance        Extremity/Trunk Assessment   Upper Extremity Assessment Upper Extremity Assessment: Defer to OT evaluation    Lower Extremity Assessment Lower Extremity Assessment: LLE deficits/detail LLE: Unable to fully assess due to pain  Communication   Communication: No difficulties  Cognition Arousal/Alertness: Awake/alert Behavior During Therapy: WFL for tasks assessed/performed;Impulsive Overall Cognitive Status: Within Functional Limits for tasks assessed                                 General Comments: required frequent cuing with simple tasks  but had recently had dilaudid.       General Comments      Exercises     Assessment/Plan    PT Assessment Patient needs continued PT services  PT Problem List Decreased strength;Decreased range of motion;Decreased activity tolerance;Decreased balance;Decreased mobility;Decreased knowledge of use of DME;Decreased safety awareness;Pain       PT Treatment Interventions DME instruction;Gait training;Stair training;Functional mobility training;Therapeutic activities;Therapeutic exercise;Manual techniques    PT Goals (Current goals can be found in the Care Plan section)  Acute Rehab PT Goals Patient Stated Goal: unstated PT Goal Formulation: With patient/family Time For Goal Achievement: 06/06/16    Frequency 7X/week   Barriers to discharge Decreased caregiver support Paitent will likely go to SNF     Co-evaluation               AM-PAC PT "6 Clicks" Daily Activity  Outcome Measure Difficulty turning over in bed (including adjusting bedclothes, sheets and blankets)?: A Lot Difficulty moving from lying on back to sitting on the side of the bed? : A Lot Difficulty sitting down on and standing up from a chair with arms (e.g., wheelchair, bedside commode, etc,.)?: A Lot Help needed moving to and from a bed to chair (including a wheelchair)?: A Lot Help needed walking in hospital room?: A Lot Help needed climbing 3-5 steps with a railing? : A Lot 6 Click Score: 12    End of Session Equipment Utilized During Treatment: Gait belt Activity Tolerance: Patient tolerated treatment well Patient left: in chair;with call bell/phone within reach;with family/visitor present Nurse Communication: Mobility status PT Visit Diagnosis: Unsteadiness on feet (R26.81);Muscle weakness (generalized) (M62.81);Pain Pain - Right/Left: Left Pain - part of body: Hip    Time: 4403-4742 PT Time Calculation (min) (ACUTE ONLY): 18 min   Charges:   PT Evaluation $PT Eval Low Complexity: 1  Procedure     PT G Codes:          Dessie Coma PT DPT  05/30/2016, 3:29 PM

## 2016-05-31 LAB — PROTIME-INR
INR: 1.73
Prothrombin Time: 20.4 seconds — ABNORMAL HIGH (ref 11.4–15.2)

## 2016-05-31 LAB — URINE CULTURE

## 2016-05-31 LAB — CBC
HEMATOCRIT: 25.7 % — AB (ref 36.0–46.0)
Hemoglobin: 8.5 g/dL — ABNORMAL LOW (ref 12.0–15.0)
MCH: 29.4 pg (ref 26.0–34.0)
MCHC: 33.1 g/dL (ref 30.0–36.0)
MCV: 88.9 fL (ref 78.0–100.0)
PLATELETS: 145 10*3/uL — AB (ref 150–400)
RBC: 2.89 MIL/uL — ABNORMAL LOW (ref 3.87–5.11)
RDW: 14.3 % (ref 11.5–15.5)
WBC: 7.9 10*3/uL (ref 4.0–10.5)

## 2016-05-31 LAB — BASIC METABOLIC PANEL
ANION GAP: 6 (ref 5–15)
BUN: 20 mg/dL (ref 6–20)
CO2: 23 mmol/L (ref 22–32)
Calcium: 7.9 mg/dL — ABNORMAL LOW (ref 8.9–10.3)
Chloride: 106 mmol/L (ref 101–111)
Creatinine, Ser: 0.89 mg/dL (ref 0.44–1.00)
GFR calc Af Amer: >60 mL/min (ref >60)
GFR calc non Af Amer: 59 mL/min — ABNORMAL LOW (ref >60)
GLUCOSE: 134 mg/dL — AB (ref 65–99)
Potassium: 3.9 mmol/L (ref 3.5–5.1)
Sodium: 135 mmol/L (ref 135–145)

## 2016-05-31 MED ORDER — POLYETHYLENE GLYCOL 3350 17 G PO PACK
17.0000 g | PACK | Freq: Two times a day (BID) | ORAL | Status: DC
  Administered 2016-05-31 – 2016-06-01 (×3): 17 g via ORAL
  Filled 2016-05-31 (×4): qty 1

## 2016-05-31 NOTE — NC FL2 (Signed)
[place] MEDICAID FL2 LEVEL OF CARE SCREENING TOOL     IDENTIFICATION  Patient Name: Christine [name] Birthdate: [birth year] Sex: female Admission Date (Current Location): 05/27/2016  [facility] and [place] Number:  Producer, television/film/video and Address:  [facility], [address] [place], [place] [address]      Provider Number: [id number]  Attending Physician Name and Address:  Coralie Keens,*  Relative Name and Phone Number:  Dtr Gunnar Fusi [phone]    Current Level of Care: Hospital Recommended Level of Care: Skilled Nursing Facility Prior Approval Number:    Date Approved/Denied: 05/28/16 PASRR Number: [id number]  Discharge Plan: SNF    Current Diagnoses: Patient Active Problem List   Diagnosis Date Noted  . Hip fracture (HCC) 05/29/2016  . Closed left hip fracture (HCC) 05/27/2016  . Acute kidney injury (HCC) 10/01/2015  . Acute encephalopathy 10/01/2015  . Interstitial cystitis 10/01/2015  . Essential hypertension 10/01/2015  . Rhabdomyolysis 09/30/2015  . UTI (lower urinary tract infection) 07/16/2013  . PAF (paroxysmal atrial fibrillation) (HCC) 07/16/2013  . Chest pain 07/16/2013  . ABDOMINAL PAIN, UNSPECIFIED SITE 02/03/2010  . DYSPEPSIA 01/18/2008  . ADENOMATOUS COLONIC POLYP 01/17/2008  . HEMORRHOIDS, INTERNAL 01/17/2008  . ESOPHAGITIS 01/17/2008  . DIVERTICULOSIS, COLON 01/17/2008  . HEMORRHOIDS-EXTERNAL 12/07/2007  . GERD 12/07/2007  . CONSTIPATION 12/07/2007  . RECTAL BLEEDING 12/07/2007  . Loss of weight 12/07/2007  . Nausea alone 12/07/2007  . HEARTBURN 12/07/2007  . ABDOMINAL BLOATING 12/07/2007  . PERSONAL HX COLONIC POLYPS 12/07/2007    Orientation RESPIRATION BLADDER Height & Weight     Self, Time, Situation, Place  Normal Continent Weight: 148 lb (67.1 kg) Height:  5\' 4"  (162.6 cm)  BEHAVIORAL SYMPTOMS/MOOD NEUROLOGICAL BOWEL NUTRITION STATUS      Continent Diet (SEE DC SUMMARY)  AMBULATORY STATUS  COMMUNICATION OF NEEDS Skin   Limited Assist Verbally Normal                       Personal Care Assistance Level of Assistance  Bathing, Feeding, Dressing Bathing Assistance: Limited assistance Feeding assistance: Limited assistance Dressing Assistance: Limited assistance     Functional Limitations Info  Sight, Hearing, Speech Sight Info: Adequate Hearing Info: Adequate Speech Info: Adequate    SPECIAL CARE FACTORS FREQUENCY  PT (By licensed PT), OT (By licensed OT)     PT Frequency: 5x wk OT Frequency: 5x wk            Contractures Contractures Info: Not present    Additional Factors Info  Code Status, Allergies, Psychotropic Code Status Info: Full Allergies Info: Gabapentin Psychotropic Info: Ativan         Current Medications (05/31/2016):  This is the current hospital active medication list Current Facility-Administered Medications  Medication Dose Route Frequency Provider Last Rate Last Dose  . acetaminophen (TYLENOL) tablet 650 mg  650 mg Oral Q6H PRN Cammy Copa, MD       Or  . acetaminophen (TYLENOL) suppository 650 mg  650 mg Rectal Q6H PRN Cammy Copa, MD      . cholecalciferol (VITAMIN D) tablet 1,000 Units  1,000 Units Oral Daily Maretta Bees, MD   1,000 Units at 05/31/16 0831  . donepezil (ARICEPT) tablet 5 mg  5 mg Oral QHS Ghimire, Werner Lean, MD   5 mg at 05/30/16 2200  . HYDROcodone-acetaminophen (NORCO/VICODIN) 5-325 MG per tablet 1-2 tablet  1-2 tablet Oral Q6H PRN Cammy Copa, MD  2 tablet at 05/31/16 1434  . HYDROmorphone (DILAUDID) injection 0.25-0.5 mg  0.25-0.5 mg Intravenous Q5 min PRN Eilene Ghazi, MD      . hydrOXYzine (ATARAX/VISTARIL) tablet 50 mg  50 mg Oral QHS Maretta Bees, MD   50 mg at 05/29/16 2104  . lactated ringers infusion   Intravenous Continuous Eilene Ghazi, MD      . LORazepam (ATIVAN) tablet 1-2 mg  1-2 mg Oral QHS Maretta Bees, MD   1 mg at 05/30/16 2200  . losartan (COZAAR)  tablet 50 mg  50 mg Oral q morning - 10a Maretta Bees, MD   50 mg at 05/30/16 0941  . menthol-cetylpyridinium (CEPACOL) lozenge 3 mg  1 lozenge Oral PRN Cammy Copa, MD       Or  . phenol (CHLORASEPTIC) mouth spray 1 spray  1 spray Mouth/Throat PRN Cammy Copa, MD      . metoCLOPramide (REGLAN) tablet 5-10 mg  5-10 mg Oral Q8H PRN Cammy Copa, MD       Or  . metoCLOPramide (REGLAN) injection 5-10 mg  5-10 mg Intravenous Q8H PRN Cammy Copa, MD      . mirabegron [facility]) tablet 50 mg  50 mg Oral Daily Maretta Bees, MD   50 mg at 05/31/16 4010  . morphine 4 MG/ML injection 0.52 mg  0.52 mg Intravenous Q2H PRN Cammy Copa, MD   0.52 mg at 05/30/16 1947  . ondansetron (ZOFRAN) tablet 4 mg  4 mg Oral Q6H PRN Cammy Copa, MD       Or  . ondansetron [facility]) injection 4 mg  4 mg Intravenous Q6H PRN Cammy Copa, MD      . pantoprazole (PROTONIX) EC tablet 40 mg  40 mg Oral q morning - 10a Maretta Bees, MD   40 mg at 05/31/16 0831  . pentosan polysulfate (ELMIRON) capsule 200 mg  200 mg Oral BID Maretta Bees, MD   200 mg at 05/31/16 0831  . polyethylene glycol (MIRALAX / GLYCOLAX) packet 17 g  17 g Oral BID Coralie Keens, MD   17 g at 05/31/16 1433  . promethazine (PHENERGAN) injection 6.25-12.5 mg  6.25-12.5 mg Intravenous Q15 min PRN Eilene Ghazi, MD      . Rivaroxaban Carlena Hurl) tablet 15 mg  15 mg Oral Daily Cammy Copa, MD   15 mg at 05/31/16 0831  . senna (SENOKOT) tablet 8.6 mg  1 tablet Oral BID Maretta Bees, MD   8.6 mg at 05/31/16 0831  . vitamin B-12 (CYANOCOBALAMIN) tablet 1,000 mcg  1,000 mcg Oral Daily Maretta Bees, MD   1,000 mcg at 05/31/16 0830     Discharge Medications: Please see discharge summary for a list of discharge medications.  Relevant Imaging Results:  Relevant Lab Results:   Additional Information [phone] SHELBY B, LCSWA

## 2016-05-31 NOTE — Progress Notes (Signed)
Physical Therapy Treatment Patient Details Name: Christine [name] MRN: [id number] DOB: 08-28-1933 Today's Date: 05/31/2016    History of Present Illness Patient is S/P left anterior THA following a fall and sub capital fx. She was active and indepdenent prior to fall. PMH: HTN, GERD, TIA, osteopenia, PAF, arthritis, GERD,      PT Comments    Pt progressing slowly towards PT goals.  Amb 5' x 2 limited by nausea & pain.  Pt stating she felt she needed to have BM.  Assisted pt into bathroom but had to bring 3-in-1 up to her before due to pt stating she needed to sit.  Unsuccessful with BM.  Required min assist for transfers and amb, max assist for sit>supine.  Cont to recommend SNF at d/c to maximize functional mobility prior to d/c home.     Follow Up Recommendations  SNF     Equipment Recommendations  None recommended by PT    Recommendations for Other Services       Precautions / Restrictions Precautions Precautions: Fall Restrictions LLE Weight Bearing: Weight bearing as tolerated    Mobility  Bed Mobility Overal bed mobility: Needs Assistance Bed Mobility: Sit to Supine     Supine to sit: Max assist Sit to supine: +2 for physical assistance   General bed mobility comments: (A) to manage LE's and shoulders/trunk to supine position.  use of draw pad to scoot pt up towards HOB.   Transfers Overall transfer level: Needs assistance Equipment used: Rolling walker (2 wheeled) Transfers: Sit to/from Stand Sit to Stand: Min assist         General transfer comment: cues for hand placement.    Ambulation/Gait Ambulation/Gait assistance: Min assist Ambulation Distance (Feet): 5 Feet (x 2) Assistive device: Rolling walker (2 wheeled) Gait Pattern/deviations: Step-through pattern;Decreased weight shift to left;Decreased step length - right;Decreased step length - left;Trunk flexed Gait velocity: decr   General Gait Details: mod cues for sequencing, RW advancement, and  upright posture. c/o nausea.  Lt knee rolls inwards with standing.    Stairs            Wheelchair Mobility    Modified Rankin (Stroke Patients Only)       Balance                                            Cognition Arousal/Alertness: Awake/alert Behavior During Therapy: WFL for tasks assessed/performed;Impulsive                                          Exercises Total Joint Exercises Ankle Circles/Pumps: AROM;Both;10 reps Gluteal Sets: AROM;Strengthening;Both;10 reps Heel Slides: AAROM;Strengthening;Left;10 reps Long Arc Quad: AAROM;Left;10 reps    General Comments General comments (skin integrity, edema, etc.): daughter present entire session.  pt c/o nausea with amb.        Pertinent Vitals/Pain Pain Assessment: 0-10 Pain Score: 8  Pain Location: left hip  Pain Descriptors / Indicators: Aching;Discomfort;Grimacing;Guarding Pain Intervention(s): Limited activity within patient's tolerance;Monitored during session;Premedicated before session;Repositioned    Home Living                      Prior Function            PT Goals (current goals can now  be found in the care plan section) Acute Rehab PT Goals PT Goal Formulation: With patient/family Time For Goal Achievement: 06/06/16    Frequency    7X/week      PT Plan Current plan remains appropriate    Co-evaluation              AM-PAC PT "6 Clicks" Daily Activity  Outcome Measure                   End of Session Equipment Utilized During Treatment: Gait belt Activity Tolerance: Patient limited by lethargy;Patient limited by pain Patient left: with call bell/phone within reach;in bed;with family/visitor present Nurse Communication: Mobility status       Time: 6962-9528 PT Time Calculation (min) (ACUTE ONLY): 49 min  Charges:  $Gait Training: 8-22 mins $Therapeutic Exercise: 8-22 mins $Therapeutic Activity: 8-22 mins                     G CodesVerdell Face, [place] [phone] 05/31/2016    Lara Mulch 05/31/2016, 12:49 PM

## 2016-05-31 NOTE — Progress Notes (Signed)
CSW responsed to consult pt's family wants to D/C pt on 5/27, despite pt's provider, per family, stating pt will D/C on Tuesday due to [place] D/C will be difficult on Monday 5/28 due to the holidays. Pt and family states they prefer [facility]   CSW contacted Rollene Fare at [facility] who stated Monday 06/01/16 admittance into [facility] can be processed by [facility].   CSW completing assessment, FL-2 and PASSR.   Christine [name]. Christine [name], [place], LCAS Clinical Social Worker Ph: [phone]

## 2016-05-31 NOTE — Clinical Social Work Placement (Addendum)
CLINICAL SOCIAL WORK PLACEMENT  NOTE  Date:  05/31/2016  Patient Details  Name: Christine [name] MRN: [id number] Date of Birth: Aug 14, 1933  Clinical Social Work is seeking post-discharge placement for this patient at the Skilled  Nursing Facility level of care (*CSW will initial, date and re-position this form in  chart as items are completed):  Yes   Patient/family provided with [facility] Clinical Social Work Department's list of facilities offering this level of care within the geographic area requested by the patient (or if unable, by the patient's family).  Yes   Patient/family informed of their freedom to choose among providers that offer the needed level of care, that participate in Medicare, Medicaid or managed care program needed by the patient, have an available bed and are willing to accept the patient.  Yes   Patient/family informed of [facility]'s ownership interest in [facility] and [facility], as well as of the fact that they are under no obligation to receive care at these facilities.  PASRR submitted to EDS on 05/28/16     PASRR number received on 05/28/16     Existing PASRR number confirmed on 05/31/16     FL2 transmitted to all facilities in geographic area requested by pt/family on 05/31/16     FL2 transmitted to all facilities within larger geographic area on   05/31/16   Patient informed that his/her managed care company has contracts with or will negotiate with certain facilities, including the following:         Yes  Patient/family informed of bed offers received.  Patient chooses bed at     [facility]  Physician recommends and patient chooses bed at      Patient to be transferred to  [facility] on   06/02/16.  Patient to be transferred to facility by    [facility].  Patient family notified on   06/02/2016 of transfer.  Name of family member notified:     daughter   PHYSICIAN Please sign FL2     Additional Comment:     _______________________________________________ Norlene Duel, LCSWA 05/31/2016, 4:12 PM

## 2016-05-31 NOTE — Progress Notes (Signed)
PROGRESS NOTE    Christine [name]  NGE:[id number] DOB: [birth year] DOA: 05/27/2016 PCP: Rodrigo Ran, MD     Brief Narrative:  83 yo female presented with the chief complain of left hip pain after a mechanical fall. Patient known to have mild dementia, paroxysmal atrial fibrillation and HTN. Patient tripped and fell, landing on her left side, developing severe pain on the left hip, unable to stand back on her feet. No head trauma or loss of consciousness. Before the fall patient physically active, able to walk about 800 yards with no difficulty. On the initial physical examination, blood pressure 149/71, with heart rate of 64, RR 16 and oxygen saturation 93%. Lungs clear to auscultation, rhythmic S1 and S2, soft abdomen, non focal. Imaging positive for left hip fracture. Admitted for further pain control and surgical evaluation. Surgical intervention 05/25. LEFT TOTAL HIP ARTHROPLASTY ANTERIOR APPROACH.    Assessment & Plan:   Active Problems:   PAF (paroxysmal atrial fibrillation) (HCC)   Interstitial cystitis   Essential hypertension   Closed left hip fracture (HCC)   Hip fracture (HCC)  1. Left hip fracture. Continue pain control with hydrocodone and IV hydromorphone/ morphine. Physical therapy evaluation, with recommendations for SNF.   2. HTN. Continue losartan 50 mg daily, for blood pressure control.   3. Interstitial cystitis. home regimen with mirabegron and pentosan.   4. Dementia. On lorazepam prn and daily donepezil.   5. Paroxysmal atrial fibrillation. Patient tolerating well anticoagulation with rivaroxaban, per home regimen. Not on av blocking agents.   DVT prophylaxis:enoxaparin  Code Status:Full  Family Communication:Patient's family at the bedside and all questions were addressed.  Disposition Plan:home vs snf  Consultants:  Orthopedic surgery     Procedures:  LEFT TOTAL HIP ARTHROPLASTY ANTERIOR APPROACH.    Antimicrobials:    Subjective: Patient with leg pain improved, no nausea or vomiting, no chest pain or dyspnea, has been working with physical therapy.   Objective: Vitals:   05/30/16 1648 05/30/16 1715 05/30/16 2009 05/31/16 0419  BP: (!) 128/47  (!) 126/43 (!) 113/51  Pulse: 99  95 92  Resp:   16 16  Temp: (!) 100.8 F (38.2 C) 100.2 F (37.9 C) (!) 100.7 F (38.2 C) 99.9 F (37.7 C)  TempSrc: Oral Oral Oral Oral  SpO2: 92%  92% 94%  Weight:      Height:        Intake/Output Summary (Last 24 hours) at 05/31/16 1108 Last data filed at 05/30/16 1500  Gross per 24 hour  Intake                0 ml  Output              700 ml  Net             -700 ml   Filed Weights   05/29/16 0900  Weight: 67.1 kg (148 lb)    Examination:  General exam: Not in pain or dyspnea E ENT: mild pallor, no icterus, oral mucosa moist.   Respiratory system: Clear to auscultation. Respiratory effort normal. Cardiovascular system: S1 & S2 heard No JVD, murmurs, rubs, gallops or clicks. No pedal edema. Irregular -irregular.  Gastrointestinal system: Abdomen is nondistended, soft and nontender. No organomegaly or masses felt. Normal bowel sounds heard. Central nervous system: Alert and oriented. No focal neurological deficits. Extremities: Symmetric 5 x 5 power. Skin: No rashes, lesions or ulcers     Data Reviewed: I have personally reviewed following labs  and imaging studies  CBC:  Recent Labs Lab 05/27/16 1524 05/28/16 2036 05/29/16 0630 05/30/16 0343 05/31/16 0323  WBC 8.7 6.8 7.5 8.1 7.9  NEUTROABS 6.7  --  5.5 6.2  --   HGB 12.3 11.4* 12.1 10.0* 8.5*  HCT 36.3 34.4* 35.6* 30.6* 25.7*  MCV 90.5 90.3 89.2 90.0 88.9  PLT 174 152 149* 140* 145*   Basic Metabolic Panel:  Recent Labs Lab 05/27/16 1524 05/28/16 2036 05/29/16 0630 05/30/16 0343 05/31/16 0323  NA 139 138 138 136 135  K 3.7 4.1 3.8 4.3 3.9  CL 107 109 107 106 106  CO2 26 23 22 22 23   GLUCOSE 106* 130* 104* 135* 134*  BUN 15  12 10 14 20   CREATININE 0.93 1.07* 0.82 0.95 0.89  CALCIUM 9.0 8.6* 8.7* 7.8* 7.9*   GFR: Estimated Creatinine Clearance: 46.7 mL/min (by C-G formula based on SCr of 0.89 mg/dL). Liver Function Tests: No results for input(s): AST, ALT, ALKPHOS, BILITOT, PROT, ALBUMIN in the last 168 hours. No results for input(s): LIPASE, AMYLASE in the last 168 hours. No results for input(s): AMMONIA in the last 168 hours. Coagulation Profile:  Recent Labs Lab 05/27/16 1524 05/30/16 0343 05/31/16 0323  INR 0.97 1.24 1.73   Cardiac Enzymes: No results for input(s): CKTOTAL, CKMB, CKMBINDEX, TROPONINI in the last 168 hours. BNP (last 3 results) No results for input(s): PROBNP in the last 8760 hours. HbA1C: No results for input(s): HGBA1C in the last 72 hours. CBG: No results for input(s): GLUCAP in the last 168 hours. Lipid Profile: No results for input(s): CHOL, HDL, LDLCALC, TRIG, CHOLHDL, LDLDIRECT in the last 72 hours. Thyroid Function Tests: No results for input(s): TSH, T4TOTAL, FREET4, T3FREE, THYROIDAB in the last 72 hours. Anemia Panel: No results for input(s): VITAMINB12, FOLATE, FERRITIN, TIBC, IRON, RETICCTPCT in the last 72 hours. Sepsis Labs: No results for input(s): PROCALCITON, LATICACIDVEN in the last 168 hours.  Recent Results (from the past 240 hour(s))  Surgical pcr screen     Status: None   Collection Time: 05/27/16  9:47 PM  Result Value Ref Range Status   MRSA, PCR NEGATIVE NEGATIVE Final   Staphylococcus aureus NEGATIVE NEGATIVE Final    Comment:        The Xpert SA Assay (FDA approved for NASAL specimens in patients over 27 years of age), is one component of a comprehensive surveillance program.  Test performance has been validated by [facility] for patients greater than or equal to 50 year old. It is not intended to diagnose infection nor to guide or monitor treatment.   Urine culture     Status: Abnormal   Collection Time: 05/28/16 10:24 PM  Result  Value Ref Range Status   Specimen Description URINE, CATHETERIZED  Final   Special Requests NONE  Final   Culture 80,000 COLONIES/mL ENTEROCOCCUS FAECALIS (A)  Final   Report Status 05/31/2016 FINAL  Final   Organism ID, Bacteria ENTEROCOCCUS FAECALIS (A)  Final      Susceptibility   Enterococcus faecalis - MIC*    AMPICILLIN <=2 SENSITIVE Sensitive     LEVOFLOXACIN >=8 RESISTANT Resistant     NITROFURANTOIN <=16 SENSITIVE Sensitive     VANCOMYCIN 1 SENSITIVE Sensitive     * 80,000 COLONIES/mL ENTEROCOCCUS FAECALIS         Radiology Studies: Pelvis Portable  Result Date: 05/29/2016 CLINICAL DATA:  Postoperative radiograph, status post left hip arthroplasty. Initial encounter. EXAM: PORTABLE PELVIS 1-2 VIEWS COMPARISON:  Left hip  radiographs performed earlier today at 3:49 p.m. FINDINGS: The patient's left total hip arthroplasty is grossly unremarkable in appearance, without evidence of loosening. No new fractures are seen. The right hip joint is grossly unremarkable. Mild sclerosis is noted at the sacroiliac joints. Postoperative change is noted about the left hip. The visualized bowel gas pattern is grossly unremarkable. IMPRESSION: Left total hip arthroplasty is grossly unremarkable in appearance, without evidence of loosening. Electronically Signed   By: Roanna Raider M.D.   On: 05/29/2016 19:06   Dg C-arm 61-120 Min  Result Date: 05/29/2016 CLINICAL DATA:  Left total hip arthroplasty.  Initial encounter. EXAM: OPERATIVE LEFT HIP WITH PELVIS COMPARISON:  Left hip radiographs performed 05/27/2016 FINDINGS: The patient is status post left total hip arthroplasty. There is no evidence of loosening or new fracture. Overlying postoperative soft tissue air is noted. IMPRESSION: Status post left total hip arthroplasty, without evidence of loosening or new fracture. Electronically Signed   By: Roanna Raider M.D.   On: 05/29/2016 18:22   Dg Hip Operative Unilat W Or W/o Pelvis Left  Result  Date: 05/29/2016 CLINICAL DATA:  Left total hip arthroplasty.  Initial encounter. EXAM: OPERATIVE LEFT HIP WITH PELVIS COMPARISON:  Left hip radiographs performed 05/27/2016 FINDINGS: The patient is status post left total hip arthroplasty. There is no evidence of loosening or new fracture. Overlying postoperative soft tissue air is noted. IMPRESSION: Status post left total hip arthroplasty, without evidence of loosening or new fracture. Electronically Signed   By: Roanna Raider M.D.   On: 05/29/2016 18:22        Scheduled Meds: . cholecalciferol  1,000 Units Oral Daily  . donepezil  5 mg Oral QHS  . hydrOXYzine  50 mg Oral QHS  . LORazepam  1-2 mg Oral QHS  . losartan  50 mg Oral q morning - 10a  . mirabegron ER  50 mg Oral Daily  . pantoprazole  40 mg Oral q morning - 10a  . pentosan polysulfate  200 mg Oral BID  . rivaroxaban  15 mg Oral Daily  . senna  1 tablet Oral BID  . vitamin B-12  1,000 mcg Oral Daily   Continuous Infusions: . lactated ringers       LOS: 4 days       Mauricio Annett Gula, MD [facility] Pager [phone]  If 7PM-7AM, please contact night-coverage [email or web address] Password [email or web address] 05/31/2016, 11:08 AM

## 2016-06-01 DIAGNOSIS — S72002A Fracture of unspecified part of neck of left femur, initial encounter for closed fracture: Secondary | ICD-10-CM

## 2016-06-01 LAB — CBC WITH DIFFERENTIAL/PLATELET
BASOS ABS: 0 10*3/uL (ref 0.0–0.1)
Basophils Relative: 1 %
EOS PCT: 1 %
Eosinophils Absolute: 0.1 10*3/uL (ref 0.0–0.7)
HCT: 27 % — ABNORMAL LOW (ref 36.0–46.0)
Hemoglobin: 9 g/dL — ABNORMAL LOW (ref 12.0–15.0)
LYMPHS ABS: 1.2 10*3/uL (ref 0.7–4.0)
Lymphocytes Relative: 14 %
MCH: 28.8 pg (ref 26.0–34.0)
MCHC: 33.3 g/dL (ref 30.0–36.0)
MCV: 86.5 fL (ref 78.0–100.0)
Monocytes Absolute: 1.1 10*3/uL — ABNORMAL HIGH (ref 0.1–1.0)
Monocytes Relative: 13 %
Neutro Abs: 6 10*3/uL (ref 1.7–7.7)
Neutrophils Relative %: 71 %
Platelets: 173 10*3/uL (ref 150–400)
RBC: 3.12 MIL/uL — AB (ref 3.87–5.11)
RDW: 15.1 % (ref 11.5–15.5)
WBC: 8.4 10*3/uL (ref 4.0–10.5)

## 2016-06-01 LAB — CBC
HCT: 23 % — ABNORMAL LOW (ref 36.0–46.0)
HEMOGLOBIN: 7.8 g/dL — AB (ref 12.0–15.0)
MCH: 30.1 pg (ref 26.0–34.0)
MCHC: 33.9 g/dL (ref 30.0–36.0)
MCV: 88.8 fL (ref 78.0–100.0)
Platelets: 163 10*3/uL (ref 150–400)
RBC: 2.59 MIL/uL — AB (ref 3.87–5.11)
RDW: 14.4 % (ref 11.5–15.5)
WBC: 6.9 10*3/uL (ref 4.0–10.5)

## 2016-06-01 LAB — ABO/RH: ABO/RH(D): A POS

## 2016-06-01 LAB — PROTIME-INR
INR: 1.63
Prothrombin Time: 19.5 seconds — ABNORMAL HIGH (ref 11.4–15.2)

## 2016-06-01 LAB — PREPARE RBC (CROSSMATCH)

## 2016-06-01 MED ORDER — SODIUM CHLORIDE 0.9 % IV SOLN: Freq: Once | INTRAVENOUS | Status: DC

## 2016-06-01 MED ORDER — HYDROCODONE-ACETAMINOPHEN 5-325 MG PO TABS: 1.0000 | ORAL_TABLET | Freq: Four times a day (QID) | ORAL | 0 refills | Status: DC | PRN

## 2016-06-01 NOTE — Progress Notes (Signed)
Pt stable txfusion for hgb 7.0 Ready for tx to snf am possibly rx for pain meds and dvt prophylaxis on chart

## 2016-06-01 NOTE — Progress Notes (Signed)
PROGRESS NOTE    Christine [name]  ZHY:[id number] DOB: [birth year] DOA: 05/27/2016 PCP: Rodrigo Ran, MD     Brief Narrative:  83 yo female presented with the chief complain of left hip pain after a mechanical fall. Patient known to have mild dementia, paroxysmal atrial fibrillation and HTN. Patient tripped and fell, landing on her left side, developing severe pain on the left hip, unable to stand back on her feet. No head trauma or loss of consciousness. Before the fall patient physically active, able to walk about 800 yards with no difficulty. On the initial physical examination, blood pressure 149/71, with heart rate of 64, RR 16 and oxygen saturation 93%. Lungs clear to auscultation, rhythmic S1 and S2, soft abdomen, non focal. Imaging positive for left hip fracture. Admitted for further pain control and surgical evaluation. Surgical intervention 05/25. LEFT TOTAL HIP ARTHROPLASTY ANTERIOR APPROACH.    Assessment & Plan:  1. Left hip fracture. Continue pain control with hydrocodone and IV hydromorphone/ morphine. Physical therapy evaluation, with recommendations for SNF.   2. HTN. Continue losartan 50 mg daily, for blood pressure control.   3. Interstitial cystitis. home regimen with mirabegron and pentosan.   4. Dementia. On lorazepam prn and daily donepezil.   5. Paroxysmal atrial fibrillation. Back on rivaroxaban, per home regimen. Not on av blocking agents.   6. Postoperative blood loss anemia. Hemoglobin 7.9, will transfuse 1 unit PRBC. Recheck hemoglobin currently appropriately elevated. I would recheck tomorrow and if remains stable we will be discharging home. No evidence of hematoma at the surgical site.  DVT prophylaxis:enoxaparin  Code Status:Full  Family Communication:No family at bedside  Disposition Plan:SNF tomorrow  Consultants:  Orthopedic surgery     Procedures:  LEFT TOTAL HIP ARTHROPLASTY ANTERIOR APPROACH.    Antimicrobials:    Subjective: Feeling better, pain is well-controlled. Requesting RN at present.  Objective: Vitals:   06/01/16 0330 06/01/16 1314 06/01/16 1342 06/01/16 1530  BP: 119/60 128/76 118/70 110/62  Pulse: 99 90 85 86  Resp: 16 18 (!) 22 18  Temp: 98.7 F (37.1 C) 98.8 F (37.1 C) 98.8 F (37.1 C) 98.7 F (37.1 C)  TempSrc: Oral Oral Oral Oral  SpO2: 95% 96%    Weight:      Height:        Intake/Output Summary (Last 24 hours) at 06/01/16 1814 Last data filed at 06/01/16 1500  Gross per 24 hour  Intake           511.25 ml  Output                0 ml  Net           511.25 ml   Filed Weights   05/29/16 0900  Weight: 67.1 kg (148 lb)    Examination:  General exam: Not in pain or dyspnea E ENT: mild pallor, no icterus, oral mucosa moist.   Respiratory system: Clear to auscultation. Respiratory effort normal. Cardiovascular system: S1 & S2 heard No JVD, murmurs, rubs, gallops or clicks. No pedal edema. Irregular -irregular.  Gastrointestinal system: Abdomen is nondistended, soft and nontender. No organomegaly or masses felt. Normal bowel sounds heard. Central nervous system: Alert and oriented. No focal neurological deficits. Extremities: Symmetric 5 x 5 power. Skin: No rashes, lesions or ulcers     Data Reviewed: I have personally reviewed following labs and imaging studies  CBC:  Recent Labs Lab 05/27/16 1524  05/29/16 0630 05/30/16 0343 05/31/16 0323 06/01/16 0534 06/01/16 1650  WBC 8.7  < > 7.5 8.1 7.9 6.9 8.4  NEUTROABS 6.7  --  5.5 6.2  --   --  6.0  HGB 12.3  < > 12.1 10.0* 8.5* 7.8* 9.0*  HCT 36.3  < > 35.6* 30.6* 25.7* 23.0* 27.0*  MCV 90.5  < > 89.2 90.0 88.9 88.8 86.5  PLT 174  < > 149* 140* 145* 163 173  < > = values in this interval not displayed. Basic Metabolic Panel:  Recent Labs Lab 05/27/16 1524 05/28/16 2036 05/29/16 0630 05/30/16 0343 05/31/16 0323  NA 139 138 138 136 135  K 3.7 4.1 3.8 4.3 3.9  CL 107 109 107 106 106  CO2 26 23  22 22 23   GLUCOSE 106* 130* 104* 135* 134*  BUN 15 12 10 14 20   CREATININE 0.93 1.07* 0.82 0.95 0.89  CALCIUM 9.0 8.6* 8.7* 7.8* 7.9*   GFR: Estimated Creatinine Clearance: 46.7 mL/min (by C-G formula based on SCr of 0.89 mg/dL). Liver Function Tests: No results for input(s): AST, ALT, ALKPHOS, BILITOT, PROT, ALBUMIN in the last 168 hours. No results for input(s): LIPASE, AMYLASE in the last 168 hours. No results for input(s): AMMONIA in the last 168 hours. Coagulation Profile:  Recent Labs Lab 05/27/16 1524 05/30/16 0343 05/31/16 0323 06/01/16 0534  INR 0.97 1.24 1.73 1.63   Cardiac Enzymes: No results for input(s): CKTOTAL, CKMB, CKMBINDEX, TROPONINI in the last 168 hours. BNP (last 3 results) No results for input(s): PROBNP in the last 8760 hours. HbA1C: No results for input(s): HGBA1C in the last 72 hours. CBG: No results for input(s): GLUCAP in the last 168 hours. Lipid Profile: No results for input(s): CHOL, HDL, LDLCALC, TRIG, CHOLHDL, LDLDIRECT in the last 72 hours. Thyroid Function Tests: No results for input(s): TSH, T4TOTAL, FREET4, T3FREE, THYROIDAB in the last 72 hours. Anemia Panel: No results for input(s): VITAMINB12, FOLATE, FERRITIN, TIBC, IRON, RETICCTPCT in the last 72 hours. Sepsis Labs: No results for input(s): PROCALCITON, LATICACIDVEN in the last 168 hours.  Recent Results (from the past 240 hour(s))  Surgical pcr screen     Status: None   Collection Time: 05/27/16  9:47 PM  Result Value Ref Range Status   MRSA, PCR NEGATIVE NEGATIVE Final   Staphylococcus aureus NEGATIVE NEGATIVE Final    Comment:        The Xpert SA Assay (FDA approved for NASAL specimens in patients over 57 years of age), is one component of a comprehensive surveillance program.  Test performance has been validated by [facility] for patients greater than or equal to 42 year old. It is not intended to diagnose infection nor to guide or monitor treatment.   Urine  culture     Status: Abnormal   Collection Time: 05/28/16 10:24 PM  Result Value Ref Range Status   Specimen Description URINE, CATHETERIZED  Final   Special Requests NONE  Final   Culture 80,000 COLONIES/mL ENTEROCOCCUS FAECALIS (A)  Final   Report Status 05/31/2016 FINAL  Final   Organism ID, Bacteria ENTEROCOCCUS FAECALIS (A)  Final      Susceptibility   Enterococcus faecalis - MIC*    AMPICILLIN <=2 SENSITIVE Sensitive     LEVOFLOXACIN >=8 RESISTANT Resistant     NITROFURANTOIN <=16 SENSITIVE Sensitive     VANCOMYCIN 1 SENSITIVE Sensitive     * 80,000 COLONIES/mL ENTEROCOCCUS FAECALIS         Radiology Studies: No results found.      Scheduled Meds: . cholecalciferol  1,000 Units Oral Daily  . donepezil  5 mg Oral QHS  . hydrOXYzine  50 mg Oral QHS  . LORazepam  1-2 mg Oral QHS  . losartan  50 mg Oral q morning - 10a  . mirabegron ER  50 mg Oral Daily  . pantoprazole  40 mg Oral q morning - 10a  . pentosan polysulfate  200 mg Oral BID  . polyethylene glycol  17 g Oral BID  . rivaroxaban  15 mg Oral Daily  . senna  1 tablet Oral BID  . vitamin B-12  1,000 mcg Oral Daily   Continuous Infusions:    LOS: 5 days   Author:  Lynden Oxford, MD [facility] Pager: [phone] 06/01/2016 6:16 PM     If 7PM-7AM, please contact night-coverage [email or web address] Password [facility] 06/01/2016, 6:14 PM

## 2016-06-01 NOTE — Progress Notes (Signed)
Physical Therapy Treatment Patient Details Name: Christine [name] MRN: [id number] DOB: 09-19-1933 Today's Date: 06/01/2016    History of Present Illness Patient is S/P left anterior THA following a fall and sub capital fx. She was active and indepdenent prior to fall. PMH: HTN, GERD, TIA, osteopenia, PAF, arthritis, GERD,      PT Comments    Plans to go to SNF, maybe tomorrow. Up to BR with staff several times today, has been up in recliner.   Follow Up Recommendations  SNF     Equipment Recommendations  None recommended by PT    Recommendations for Other Services       Precautions / Restrictions      Mobility  Bed Mobility               General bed mobility comments: in bed-has been OOB  to BR with staff.  Transfers                    Ambulation/Gait                 Stairs            Wheelchair Mobility    Modified Rankin (Stroke Patients Only)       Balance                                            Cognition Arousal/Alertness: Awake/alert                                            Exercises Total Joint Exercises Ankle Circles/Pumps: AROM;Both;10 reps Quad Sets: AROM;Both;10 reps Gluteal Sets: AROM;Strengthening;Both;10 reps Short Arc Quad: AROM;Left;10 reps Heel Slides: AAROM;Strengthening;Left;10 reps Hip ABduction/ADduction: AAROM;10 reps    General Comments        Pertinent Vitals/Pain Faces Pain Scale: Hurts a little bit Pain Location: left hip  Pain Descriptors / Indicators: Sore Pain Intervention(s): Premedicated before session    Home Living                      Prior Function            PT Goals (current goals can now be found in the care plan section) Progress towards PT goals: Progressing toward goals    Frequency    Min 3X/week      PT Plan Current plan remains appropriate;Frequency needs to be updated    Co-evaluation               AM-PAC PT "6 Clicks" Daily Activity  Outcome Measure  Difficulty turning over in bed (including adjusting bedclothes, sheets and blankets)?: A Lot Difficulty moving from lying on back to sitting on the side of the bed? : A Lot Difficulty sitting down on and standing up from a chair with arms (e.g., wheelchair, bedside commode, etc,.)?: A Lot Help needed moving to and from a bed to chair (including a wheelchair)?: A Lot Help needed walking in hospital room?: A Lot Help needed climbing 3-5 steps with a railing? : A Lot 6 Click Score: 12    End of Session   Activity Tolerance: Patient tolerated treatment well Patient left: in bed;with family/visitor present Nurse Communication: Mobility status PT Visit Diagnosis: Unsteadiness on  feet (R26.81);Muscle weakness (generalized) (M62.81);Pain Pain - Right/Left: Left Pain - part of body: Hip     Time: 2536-6440 PT Time Calculation (min) (ACUTE ONLY): 15 min  Charges:  $Therapeutic Exercise: 8-22 mins                    G CodesBlanchard Kelch PT [phone]   Rada Hay 06/01/2016, 4:19 PM

## 2016-06-02 ENCOUNTER — Encounter (HOSPITAL_COMMUNITY): Payer: Self-pay | Admitting: Orthopedic Surgery

## 2016-06-02 DIAGNOSIS — G301 Alzheimer's disease with late onset: Secondary | ICD-10-CM | POA: Diagnosis not present

## 2016-06-02 DIAGNOSIS — K219 Gastro-esophageal reflux disease without esophagitis: Secondary | ICD-10-CM | POA: Diagnosis not present

## 2016-06-02 DIAGNOSIS — I480 Paroxysmal atrial fibrillation: Secondary | ICD-10-CM | POA: Diagnosis not present

## 2016-06-02 DIAGNOSIS — M6281 Muscle weakness (generalized): Secondary | ICD-10-CM | POA: Diagnosis not present

## 2016-06-02 DIAGNOSIS — D62 Acute posthemorrhagic anemia: Secondary | ICD-10-CM | POA: Diagnosis not present

## 2016-06-02 DIAGNOSIS — I10 Essential (primary) hypertension: Secondary | ICD-10-CM | POA: Diagnosis not present

## 2016-06-02 DIAGNOSIS — S72002D Fracture of unspecified part of neck of left femur, subsequent encounter for closed fracture with routine healing: Secondary | ICD-10-CM | POA: Diagnosis not present

## 2016-06-02 DIAGNOSIS — N3010 Interstitial cystitis (chronic) without hematuria: Secondary | ICD-10-CM | POA: Diagnosis not present

## 2016-06-02 DIAGNOSIS — Z96642 Presence of left artificial hip joint: Secondary | ICD-10-CM | POA: Diagnosis not present

## 2016-06-02 DIAGNOSIS — I482 Chronic atrial fibrillation: Secondary | ICD-10-CM | POA: Diagnosis not present

## 2016-06-02 DIAGNOSIS — R278 Other lack of coordination: Secondary | ICD-10-CM | POA: Diagnosis not present

## 2016-06-02 DIAGNOSIS — Z471 Aftercare following joint replacement surgery: Secondary | ICD-10-CM | POA: Diagnosis not present

## 2016-06-02 DIAGNOSIS — K5901 Slow transit constipation: Secondary | ICD-10-CM | POA: Diagnosis not present

## 2016-06-02 DIAGNOSIS — R2681 Unsteadiness on feet: Secondary | ICD-10-CM | POA: Diagnosis not present

## 2016-06-02 DIAGNOSIS — Z5189 Encounter for other specified aftercare: Secondary | ICD-10-CM | POA: Diagnosis not present

## 2016-06-02 DIAGNOSIS — S72002A Fracture of unspecified part of neck of left femur, initial encounter for closed fracture: Secondary | ICD-10-CM | POA: Diagnosis not present

## 2016-06-02 DIAGNOSIS — F0281 Dementia in other diseases classified elsewhere with behavioral disturbance: Secondary | ICD-10-CM | POA: Diagnosis not present

## 2016-06-02 DIAGNOSIS — G4700 Insomnia, unspecified: Secondary | ICD-10-CM | POA: Diagnosis not present

## 2016-06-02 LAB — TYPE AND SCREEN
ABO/RH(D): A POS
Antibody Screen: NEGATIVE
Unit division: 0

## 2016-06-02 LAB — BASIC METABOLIC PANEL
ANION GAP: 8 (ref 5–15)
BUN: 18 mg/dL (ref 6–20)
CHLORIDE: 107 mmol/L (ref 101–111)
CO2: 23 mmol/L (ref 22–32)
Calcium: 7.9 mg/dL — ABNORMAL LOW (ref 8.9–10.3)
Creatinine, Ser: 0.79 mg/dL (ref 0.44–1.00)
GFR calc Af Amer: >60 mL/min (ref >60)
Glucose, Bld: 102 mg/dL — ABNORMAL HIGH (ref 65–99)
POTASSIUM: 3.7 mmol/L (ref 3.5–5.1)
Sodium: 138 mmol/L (ref 135–145)

## 2016-06-02 LAB — CBC
HEMATOCRIT: 27.2 % — AB (ref 36.0–46.0)
HEMOGLOBIN: 9.2 g/dL — AB (ref 12.0–15.0)
MCH: 29.7 pg (ref 26.0–34.0)
MCHC: 33.8 g/dL (ref 30.0–36.0)
MCV: 87.7 fL (ref 78.0–100.0)
Platelets: 176 10*3/uL (ref 150–400)
RBC: 3.1 MIL/uL — AB (ref 3.87–5.11)
RDW: 16.4 % — ABNORMAL HIGH (ref 11.5–15.5)
WBC: 5.9 10*3/uL (ref 4.0–10.5)

## 2016-06-02 LAB — PROTIME-INR
INR: 1.83
Prothrombin Time: 21.5 seconds — ABNORMAL HIGH (ref 11.4–15.2)

## 2016-06-02 LAB — BPAM RBC
Blood Product Expiration Date: 201806072359
ISSUE DATE / TIME: 201805281255
UNIT TYPE AND RH: 6200

## 2016-06-02 MED ORDER — ZOLPIDEM TARTRATE 5 MG PO TABS: 5.0000 mg | ORAL_TABLET | Freq: Every evening | ORAL | 0 refills | Status: DC | PRN

## 2016-06-02 NOTE — Discharge Summary (Addendum)
[facility] Discharge Summary   Patient: Christine [name] UXL:[id number]   PCP: Christine Ran, MD DOB: [birth year]   Date of admission: 05/27/2016   Date of discharge:  06/02/2016    Discharge Diagnoses:  Active Problems:   PAF (paroxysmal atrial fibrillation) (HCC)   Interstitial cystitis   Essential hypertension   Closed left hip fracture (HCC)   Hip fracture (HCC)   Admitted From: home Disposition:  SNF  Recommendations for Outpatient Follow-up:  1. Please follow up with PCP and orthopedics as recommended    Contact information for follow-up providers    Christine Ran, MD. Schedule an appointment as soon as possible for a visit in 1 week(s).   Specialty:  Internal Medicine Contact information: [address] [place] [address] [phone]        Christine Copa, MD. Schedule an appointment as soon as possible for a visit in 2 week(s).   Specialty:  Orthopedic Surgery Contact information: [address] [place] [address] [phone]            Contact information for after-discharge care    Destination    [facility] Follow up.   Specialty:  Skilled Nursing Facility Contact information: [address] [place] [address] [phone]                 Diet recommendation: cardiac diet  Activity: The patient is advised to gradually reintroduce usual activities.  Discharge Condition: good  Code Status: full code  History of present illness: As per the H and P dictated on admission, "Christine [name] is a 83 y.o. female with medical history significant of mild dementia, paroxysmal atrial fibrillation on anticoagulation, hypertension brought to the ED for evaluation of the above-noted complaint. Patient was playing with her great grandchildren today, and unfortunately tripped and fell on her left side. She has severe hip pain following this fall, she was unable to ambulate. She was subsequently  brought to the ED, where a x-ray confirmed left hip fracture.  Patient denied any loss of consciousness.  At baseline, patient is [name] is easily able to do her household chores, she is easily able to walk approximately 800 yards without any difficulty. Per family, she is easily able to climb a couple of flights of stairs without major issues.  There is no history of fever, headache, visual changes, neck pain, chest pain, shortness of breath, nausea, vomiting or diarrhea."  Hospital Course:  Summary of her active problems in the hospital is as following. 1. Left hip fracture.  Continue pain control. Physical therapy evaluation, with recommendations for SNF.   2. HTN.  Continue losartan 50 mg daily, for blood pressure control.   3. Interstitial cystitis.  home regimen with mirabegron and pentosan.   4. Dementia.  On lorazepam prn and daily donepezil.   5. Paroxysmal atrial fibrillation.  Back on rivaroxaban, per home regimen. Not on av blocking agents.   6. Postoperative blood loss anemia. Hemoglobin stable transfuse 1 unit PRBC.  No evidence of hematoma at the surgical site.  All other chronic medical condition were stable during the hospitalization.  Patient was seen by physical therapy, who recommended SNF, which was arranged by Child psychotherapist and case Production designer, theatre/television/film. On the day of the discharge the patient's vitals were stable, and no other acute medical condition were reported by patient. the patient was felt safe to be discharge at SNF with therapy.  Procedures and Results:   LEFT TOTAL HIP ARTHROPLASTY  ANTERIOR APPROACH 05/25  Consultations:  Orthopedics  DISCHARGE MEDICATION: Current Discharge Medication List    START taking these medications   Details  HYDROcodone-acetaminophen (NORCO/VICODIN) 5-325 MG tablet Take 1 tablet by mouth every 6 (six) hours as needed for moderate pain. Qty: 60 tablet, Refills: 0    zolpidem (AMBIEN) 5 MG tablet Take 1 tablet  (5 mg total) by mouth at bedtime as needed for sleep. Qty: 5 tablet, Refills: 0      CONTINUE these medications which have NOT CHANGED   Details  alendronate (FOSAMAX) 70 MG tablet Take 1 tablet by mouth every 7 (seven) days.    Cholecalciferol (VITAMIN D3) 1000 UNITS CAPS Take 1,000 Units by mouth daily.     donepezil (ARICEPT) 5 MG tablet Take 5 mg by mouth at bedtime.    hydrOXYzine (ATARAX/VISTARIL) 25 MG tablet Take 50 mg by mouth at bedtime.  Refills: 11    losartan (COZAAR) 50 MG tablet Take 50 mg by mouth every morning.     mirabegron ER (MYRBETRIQ) 50 MG TB24 tablet Take 50 mg by mouth daily.    pantoprazole (PROTONIX) 40 MG tablet Take 40 mg by mouth every morning.     pentosan polysulfate (ELMIRON) 100 MG capsule Take 200 mg by mouth 2 (two) times daily.    Rivaroxaban (XARELTO) 15 MG TABS tablet Take 1 tablet (15 mg total) by mouth daily with supper. Qty: 30 tablet, Refills: 0    vitamin B-12 (CYANOCOBALAMIN) 1000 MCG tablet Take 1,000 mcg by mouth daily.      STOP taking these medications     LORazepam (ATIVAN) 1 MG tablet      naproxen sodium (ALEVE) 220 MG tablet      traMADol (ULTRAM) 50 MG tablet        Allergies  Allergen Reactions  . Gabapentin Other (See Comments)    UNKNOWN   Discharge Instructions    Call MD / Call 911    Complete by:  As directed    If you experience chest pain or shortness of breath, CALL 911 and be transported to the hospital emergency room.  If you develope a fever above 101 F, pus (white drainage) or increased drainage or redness at the wound, or calf pain, call your surgeon's office.   Constipation Prevention    Complete by:  As directed    Drink plenty of fluids.  Prune juice may be helpful.  You may use a stool softener, such as Colace (over the counter) 100 mg twice a day.  Use MiraLax (over the counter) for constipation as needed.   Diet - low sodium heart healthy    Complete by:  As directed    Discharge  instructions    Complete by:  As directed    Weight bearing as tolerated with crutches or walker Ok to remove dressing on 06/10/2016   Increase activity slowly as tolerated    Complete by:  As directed      Discharge Exam: Filed Weights   05/29/16 0900  Weight: 67.1 kg (148 lb)   Vitals:   06/01/16 2116 06/02/16 0500  BP: (!) 116/49 (!) 127/58  Pulse: 99 83  Resp:  17  Temp: 98.8 F (37.1 C) 98 F (36.7 C)   General: Appear in mild distress, no Rash; Oral Mucosa moist. Cardiovascular: S1 and S2 Present, no Murmur, no JVD Respiratory: Bilateral Air entry present and Clear to Auscultation, no Crackles, no wheezes Abdomen: Bowel Sound present, Soft and no tenderness  Extremities: no Pedal edema, no calf tenderness Neurology: Grossly no focal neuro deficit.  The results of significant diagnostics from this hospitalization (including imaging, microbiology, ancillary and laboratory) are listed below for reference.    Significant Diagnostic Studies: Dg Chest 1 View  Result Date: 05/27/2016 CLINICAL DATA:  Hip fracture.  Fall. EXAM: CHEST 1 VIEW COMPARISON:  09/30/2015 FINDINGS: The heart size and mediastinal contours are within normal limits. Aortic atherosclerosis noted. Both lungs are clear. The visualized skeletal structures are unremarkable. IMPRESSION: No active disease. Aortic Atherosclerosis (ICD10-I70.0). Electronically Signed   By: Signa Kell M.D.   On: 05/27/2016 13:27   Dg Chest 2 View  Result Date: 05/28/2016 CLINICAL DATA:  Broken hip scheduled for surgery EXAM: CHEST  2 VIEW COMPARISON:  05/27/2016 FINDINGS: Tiny left pleural effusion. No focal consolidation. Stable cardiomediastinal silhouette. No pneumothorax. IMPRESSION: Tiny left pleural effusion.  No infiltrate or edema. Electronically Signed   By: Jasmine Pang M.D.   On: 05/28/2016 20:58   Ct Head Wo Contrast  Result Date: 05/27/2016 CLINICAL DATA:  Patient is alert and oriented x4. She is being see post fall  at home. Patient was playing with her grandchildren and fell. Patient is currently complaining left hip pain on movement. Pt. Unsure if she hit her head EXAM: CT HEAD WITHOUT CONTRAST TECHNIQUE: Contiguous axial images were obtained from the base of the skull through the vertex without intravenous contrast. COMPARISON:  Brain MRI 12/21/2015 FINDINGS: Brain: No intracranial hemorrhage. No parenchymal contusion. No midline shift or mass effect. Basilar cisterns are patent. No skull base fracture. No fluid in the paranasal sinuses or mastoid air cells. Orbits are normal. There are periventricular and subcortical white matter hypodensities. Generalized cortical atrophy. Brain: No acute intracranial hemorrhage. No focal mass lesion. No CT evidence of acute infarction. No midline shift or mass effect. No hydrocephalus. Basilar cisterns are patent. Vascular: No hyperdense vessel or unexpected calcification. Skull: Normal. Negative for fracture or focal lesion. Sinuses/Orbits: Paranasal sinuses and mastoid air cells are clear. Orbits are clear. Other: None. IMPRESSION: 1. No acute intracranial findings. 2. Atrophy and white matter microvascular disease. Electronically Signed   By: Genevive Bi M.D.   On: 05/27/2016 15:52   Pelvis Portable  Result Date: 05/29/2016 CLINICAL DATA:  Postoperative radiograph, status post left hip arthroplasty. Initial encounter. EXAM: PORTABLE PELVIS 1-2 VIEWS COMPARISON:  Left hip radiographs performed earlier today at 3:49 p.m. FINDINGS: The patient's left total hip arthroplasty is grossly unremarkable in appearance, without evidence of loosening. No new fractures are seen. The right hip joint is grossly unremarkable. Mild sclerosis is noted at the sacroiliac joints. Postoperative change is noted about the left hip. The visualized bowel gas pattern is grossly unremarkable. IMPRESSION: Left total hip arthroplasty is grossly unremarkable in appearance, without evidence of loosening.  Electronically Signed   By: Roanna Raider M.D.   On: 05/29/2016 19:06   Dg C-arm 61-120 Min  Result Date: 05/29/2016 CLINICAL DATA:  Left total hip arthroplasty.  Initial encounter. EXAM: OPERATIVE LEFT HIP WITH PELVIS COMPARISON:  Left hip radiographs performed 05/27/2016 FINDINGS: The patient is status post left total hip arthroplasty. There is no evidence of loosening or new fracture. Overlying postoperative soft tissue air is noted. IMPRESSION: Status post left total hip arthroplasty, without evidence of loosening or new fracture. Electronically Signed   By: Roanna Raider M.D.   On: 05/29/2016 18:22   Dg Hip Operative Unilat W Or W/o Pelvis Left  Result Date: 05/29/2016 CLINICAL DATA:  Left total hip arthroplasty.  Initial encounter. EXAM: OPERATIVE LEFT HIP WITH PELVIS COMPARISON:  Left hip radiographs performed 05/27/2016 FINDINGS: The patient is status post left total hip arthroplasty. There is no evidence of loosening or new fracture. Overlying postoperative soft tissue air is noted. IMPRESSION: Status post left total hip arthroplasty, without evidence of loosening or new fracture. Electronically Signed   By: Roanna Raider M.D.   On: 05/29/2016 18:22   Dg Hip Unilat W Or Wo Pelvis 2-3 Views Left  Result Date: 05/27/2016 CLINICAL DATA:  Left hip pain since a fall today. Initial encounter. EXAM: DG HIP (WITH OR WITHOUT PELVIS) 2-3V LEFT COMPARISON:  None. FINDINGS: The patient has an acute subcapital fracture of the left hip. No other acute bony or joint abnormality is identified. Joint spaces are unremarkable. IMPRESSION: Acute subcapital left hip fracture. Electronically Signed   By: Drusilla Kanner M.D.   On: 05/27/2016 13:25    Microbiology: Recent Results (from the past 240 hour(s))  Surgical pcr screen     Status: None   Collection Time: 05/27/16  9:47 PM  Result Value Ref Range Status   MRSA, PCR NEGATIVE NEGATIVE Final   Staphylococcus aureus NEGATIVE NEGATIVE Final     Comment:        The Xpert SA Assay (FDA approved for NASAL specimens in patients over 68 years of age), is one component of a comprehensive surveillance program.  Test performance has been validated by [facility] for patients greater than or equal to 3 year old. It is not intended to diagnose infection nor to guide or monitor treatment.   Urine culture     Status: Abnormal   Collection Time: 05/28/16 10:24 PM  Result Value Ref Range Status   Specimen Description URINE, CATHETERIZED  Final   Special Requests NONE  Final   Culture 80,000 COLONIES/mL ENTEROCOCCUS FAECALIS (A)  Final   Report Status 05/31/2016 FINAL  Final   Organism ID, Bacteria ENTEROCOCCUS FAECALIS (A)  Final      Susceptibility   Enterococcus faecalis - MIC*    AMPICILLIN <=2 SENSITIVE Sensitive     LEVOFLOXACIN >=8 RESISTANT Resistant     NITROFURANTOIN <=16 SENSITIVE Sensitive     VANCOMYCIN 1 SENSITIVE Sensitive     * 80,000 COLONIES/mL ENTEROCOCCUS FAECALIS     Labs: CBC:  Recent Labs Lab 05/27/16 1524  05/29/16 0630 05/30/16 0343 05/31/16 0323 06/01/16 0534 06/01/16 1650 06/02/16 0807  WBC 8.7  < > 7.5 8.1 7.9 6.9 8.4 5.9  NEUTROABS 6.7  --  5.5 6.2  --   --  6.0  --   HGB 12.3  < > 12.1 10.0* 8.5* 7.8* 9.0* 9.2*  HCT 36.3  < > 35.6* 30.6* 25.7* 23.0* 27.0* 27.2*  MCV 90.5  < > 89.2 90.0 88.9 88.8 86.5 87.7  PLT 174  < > 149* 140* 145* 163 173 176  < > = values in this interval not displayed. Basic Metabolic Panel:  Recent Labs Lab 05/28/16 2036 05/29/16 0630 05/30/16 0343 05/31/16 0323 06/02/16 0512  NA 138 138 136 135 138  K 4.1 3.8 4.3 3.9 3.7  CL 109 107 106 106 107  CO2 23 22 22 23 23   GLUCOSE 130* 104* 135* 134* 102*  BUN 12 10 14 20 18   CREATININE 1.07* 0.82 0.95 0.89 0.79  CALCIUM 8.6* 8.7* 7.8* 7.9* 7.9*   Time spent: 35 minutes  Signed:  Milus Fritze  [facility]  06/02/2016  , 11:22 AM

## 2016-06-02 NOTE — Progress Notes (Signed)
RN provided patient's granddaughter with discharge AVS and prescriptions and discahrge summary for her to take to the SNF as she transports the patient. OK by MD for granddaughter to be the one to transport patient. Patient's granddaughter provided with a copy of the patient's AVS. Patient's granddaughter stated she would notify the patient's daughter of patient's discharge and transfer to SNF. IV removed. Patient and family member educated on discharge paperwork and discharge med rec and both verbalized understanding.

## 2016-06-02 NOTE — Progress Notes (Signed)
Report called to Kenney Houseman, RN, at the SNF by telephone number provided by social work.

## 2016-06-02 NOTE — Social Work (Signed)
.  Clinical Social Worker facilitated patient discharge including contacting patient family and facility to confirm patient discharge plans.  Clinical information faxed to facility and family agreeable with plan.  Patient will be transported to facility by daughter, okay per doctor.  RN to call [phone] to report prior to discharge.  Clinical Social Worker will sign off for now as social work intervention is no longer needed. Please consult [place] again if new need arises.  Elissa Hefty, LCSW Clinical Social Worker [phone]

## 2016-06-02 NOTE — Op Note (Signed)
NAME:  [name], [name]                    ACCOUNT NO.:  MEDICAL RECORD NO.:  [id number]  LOCATION:                                 FACILITY:  PHYSICIAN:  Burnard Bunting, M.D.    DATE OF BIRTH:  [birth year]  DATE OF PROCEDURE: DATE OF DISCHARGE:                              OPERATIVE REPORT   PREOPERATIVE DIAGNOSIS:  Left hip fracture, femoral neck, displaced.  POSTOPERATIVE DIAGNOSIS:  Left hip fracture, femoral neck, displaced.  PROCEDURE:  Left total hip replacement utilizing Corail stem, 13 standard neck +1 metal ball, 32 mm with 4-mm offset insert and 50-mm Pinnacle Gription cup with 1 screw.  SURGEON:  Burnard Bunting, M.D.  ASSISTANT:  Patrick Jupiter, RNFA.  INDICATIONS:  Christine [name] is an 83 year old patient, left hip fracture, presents for operative management after explanation of risks and benefits.  PROCEDURE IN DETAIL:  The patient was brought to the operating room where general anesthetic was induced.  Preoperative antibiotics were administered.  Time-out was called.  Left leg was then prescrubbed with alcohol and Betadine, prepped with DuraPrep solution and draped in sterile manner.  The patient was placed on the Hana bed.  Preoperative positioning demonstrated a femoral neck fracture.  Right leg peroneal nerve was well padded.  Following sterile prepping and draping, time-out was called.  Anterior approach was made beginning at the 2 cm posterior and distal to the anterior superior iliac crest.  Skin and subcutaneous tissue were sharply divided.  The tensor fascia lata fascia was encountered.  It was divided along its midportion.  The tensor fascia lata was mobilized laterally.  A Cobra retractor was placed over the superior femoral neck.  Two-head retractors were placed and the ascending vessels were coagulated.  Using a Cobb elevator, the inferior femoral neck was then visualized and a second Cobra retractor was placed.  Capsular arthrotomy was performed and marked  with #1 Vicryl sutures.  The femoral neck cut was then made under fluoroscopic guidance.  The head was removed and sized.  Labrum was then excised, then 90 retractor placed at the 9 o'clock position and a sharp Cobra retractor placed at the 3 o'clock position on the acetabulum.  Reaming was performed up to size 49.  Then, an approximately 45 degrees of abduction and 15 degrees of anteversion, the cup was placed with good fit obtained.  Screw was placed.  +4 liner was placed.  Attention was then directed toward the femur.  Capsule was released down to the lesser trochanter.  Initially, the lateral capsule was pulled medially and this exposed the conjoint tendon.  With the foot externally rotated about 130 degrees, the conjoint tendon was released.  The foot was taken down in extension and abduction.  A Mueller retractor was placed.  This gave nice exposure of the femur with a trochanteric retractor placed superiorly.  At this time, the broaching was performed.  Lateralization was performed.  With a 13 trial in position, the +1 head was placed on the standard neck and the patient had excellent stability and leg lengths.  Trials were removed and true components were placed after thorough irrigation.  Same stability parameters were  maintained with no dislocation with internal rotation, no dislocation with external rotation to 50 degrees and extension to 60 degrees.  At this time, thorough irrigation again performed.  Tranexamic acid sponge placed within the incision for 3 minutes.  Then, the capsule was closed using #1 Vicryl suture.  Fascia lata was closed with care being taken to avoid injury when possible to the nerve branches of the lateral femoral cutaneous nerve.  At this time, the thorough irrigation was again performed after closure watertight of the fascia lata.  Incision was then closed using 0 Vicryl suture, 2-0 Vicryl suture, and a running 3-0 Monocryl.  Aquacel dressing placed.   The patient tolerated the procedure well without immediate complications, transferred to the recovery room in stable condition.     Burnard Bunting, M.D.     GSD/MEDQ  D:  05/29/2016  T:  05/29/2016  Job:  [id number]

## 2016-06-03 ENCOUNTER — Non-Acute Institutional Stay (SKILLED_NURSING_FACILITY): Payer: Medicare Other | Admitting: Internal Medicine

## 2016-06-03 DIAGNOSIS — F0281 Dementia in other diseases classified elsewhere with behavioral disturbance: Secondary | ICD-10-CM

## 2016-06-03 DIAGNOSIS — I482 Chronic atrial fibrillation, unspecified: Secondary | ICD-10-CM

## 2016-06-03 DIAGNOSIS — G301 Alzheimer's disease with late onset: Secondary | ICD-10-CM | POA: Diagnosis not present

## 2016-06-03 DIAGNOSIS — S72002D Fracture of unspecified part of neck of left femur, subsequent encounter for closed fracture with routine healing: Secondary | ICD-10-CM

## 2016-06-03 DIAGNOSIS — N3010 Interstitial cystitis (chronic) without hematuria: Secondary | ICD-10-CM

## 2016-06-03 DIAGNOSIS — Z96642 Presence of left artificial hip joint: Secondary | ICD-10-CM | POA: Diagnosis not present

## 2016-06-03 NOTE — Progress Notes (Signed)
06/03/16  Facility; [facility]  Chief complaint; admission to SNF post stay at  [facility] 05/27/16 through 06/02/16  History; this is a 83 year old woman who apparently fell while playing She suffered a left hip fracture.she underwent a left total hip replacement. The patient does not have a history of frequent falls, does not use an ambulatory assist device. She does carry a diagnosis of osteoperosis and is on Fosamax.  The patient is apparently independedent with ADLs and IADLs at home. Still drives. Family lives right next door  BMP Latest Ref Rng & Units 06/02/2016 05/31/2016 05/30/2016  Glucose 65 - 99 mg/dL 102(H) 134(H) 135(H)  BUN 6 - 20 mg/dL 18 20 14   Creatinine 0.44 - 1.00 mg/dL 0.79 0.89 0.95  Sodium 135 - 145 mmol/L 138 135 136  Potassium 3.5 - 5.1 mmol/L 3.7 3.9 4.3  Chloride 101 - 111 mmol/L 107 106 106  CO2 22 - 32 mmol/L 23 23 22   Calcium 8.9 - 10.3 mg/dL 7.9(L) 7.9(L) 7.8(L)    CBC Latest Ref Rng & Units 06/02/2016 06/01/2016 06/01/2016  WBC 4.0 - 10.5 K/uL 5.9 8.4 6.9  Hemoglobin 12.0 - 15.0 g/dL 9.2(L) 9.0(L) 7.8(L)  Hematocrit 36.0 - 46.0 % 27.2(L) 27.0(L) 23.0(L)  Platelets 150 - 400 K/uL 176 173 163    Past Medical History:  Diagnosis Date  . AR (allergic rhinitis)   . Arthritis   . Bilateral carotid artery stenosis    MILD --  40% BILATERAL ICA PER DOPPLER JULY 2014  . Diverticulosis   . Frequency of urination   . GERD (gastroesophageal reflux disease)   . History of colon polyps    ADENOMATOUS  . History of Helicobacter pylori infection   . History of shingles   . History of TIA (transient ischemic attack)   . Hyperlipidemia   . Hypertension   . Impaired memory   . Interstitial cystitis   . Iron deficiency   . Nocturia   . Osteopenia   . PAF (paroxysmal atrial fibrillation) ([place])    dx 07/ 2015  . Pelvic pain in female   . Urgency of urination   . Vitamin D deficiency   . Wears glasses     Past Surgical History:  Procedure Laterality  Date  . ABDOMINAL HYSTERECTOMY    . CATARACT EXTRACTION W/ INTRAOCULAR LENS  IMPLANT, BILATERAL  2009  . CYSTO WITH HYDRODISTENSION  02/24/2011   Procedure: CYSTOSCOPY/HYDRODISTENSION;  Surgeon: Reece Packer, MD;  Location: [facility];  Service: Urology;  Laterality: N/A;  INSTILLATION OF marcaine and  pyridium  . CYSTO WITH HYDRODISTENSION N/A 06/20/2013   Procedure: CYSTOSCOPY/HYDRODISTENSION/INSTALLATION OF PYRIDIUM -MARCAINE 0.5%.;  Surgeon: Reece Packer, MD;  Location: [facility];  Service: Urology;  Laterality: N/A;  . CYSTO WITH HYDRODISTENSION N/A 03/22/2014   Procedure: CYSTOSCOPY/HYDRODISTENSION INSTILLATION OF MARCAINE AND PYRDIUM ;  Surgeon: Bjorn Loser, MD;  Location: [facility];  Service: Urology;  Laterality: N/A;  . CYSTO WITH HYDRODISTENSION N/A 09/17/2014   Procedure: CYSTOSCOPY HYDRODISTENSION INSTILLATION OF MARCAINE AND PYRIDIUM;  Surgeon: Bjorn Loser, MD;  Location: [facility];  Service: Urology;  Laterality: N/A;  . CYSTO/ HOD/ INSTILLATION THERAPY  10-04-2008  &  05-02-2009  . CYSTO/ LEFT URETEROSCOPY W/ LEFT URETERAL DILATION  04-15-2006  . TOTAL HIP ARTHROPLASTY Left 05/29/2016   Procedure: TOTAL HIP ARTHROPLASTY ANTERIOR APPROACH;  Surgeon: Meredith Pel, MD;  Location: [facility];  Service: Orthopedics;  Laterality: Left;  . TRANSTHORACIC  ECHOCARDIOGRAM  07-17-2013   grade I diastolic dysfuntion/  ef 89-21%/  mild AR and MR/  trivial TR  . URETEROLITHOTOMY Bilateral 11-24-2004 LEFT  &  05-29-2006  RIGHT  . VAULT SUSPENSION WITH GRAFT/ CYSTOCELE REPAIR  09-17-2009    Social patient lives on her own and is functionally independent. She does carry a diagnosis of mild Alzheimer's disease and is on Aricept 5.family lives right next door  reports that she has never smoked. She has never used smokeless tobacco. She reports that she does not drink alcohol or use drugs.  Review of  systems Gen. The patient was doing quite well  PTA Respiratory; no cough nobreath Cardi carries a diagnosis of atrial fib and is on Xarelto chronically gI no abdominal pain GU diagnosis of chronic interstitial cystitis. Currently no dysuria or hematuria Musculoskeletal; she is doing quite well, watched her walk from the bathroom to her bed with her walker she does remarkably well  Physical exam Gen. Patient is allert HEENT;no oral lesions are seen Respiratory clear entry bilaterally Cardiac heart sounds are normal no murmurs she appears to be euvolemic Abdomen; no liver no spleenno masses GU no suprapubic or costovertebral angle tenderness Extremities; there is no edema and no signs of DVT Skin; no problems, no lesion seen Musculoskeletal; she is already am and doing quite well with a walke  Impression/plan #1 status post left hip fracture status post left hip replacement. She is doing remarkably well. Family is already asking about discharge #2osteoporosis on Fosamax #3 chronic interstitial cystitis currently stable  #4 hypertension on losartan #5 mild dementia. She is on Aricept. Family is asked that I discontinued t Ambien that was ordered and substit. She is already on  Hydroxyzine as well. Careful that this does not cause issues in the postoperative state  The patient is doing remarkably well I see no other medical issues

## 2016-06-04 ENCOUNTER — Encounter: Payer: Self-pay | Admitting: Family

## 2016-06-04 ENCOUNTER — Non-Acute Institutional Stay (SKILLED_NURSING_FACILITY): Payer: Medicare Other | Admitting: Family

## 2016-06-04 DIAGNOSIS — K5901 Slow transit constipation: Secondary | ICD-10-CM

## 2016-06-04 DIAGNOSIS — K219 Gastro-esophageal reflux disease without esophagitis: Secondary | ICD-10-CM | POA: Diagnosis not present

## 2016-06-04 DIAGNOSIS — I10 Essential (primary) hypertension: Secondary | ICD-10-CM | POA: Diagnosis not present

## 2016-06-04 DIAGNOSIS — Z96642 Presence of left artificial hip joint: Secondary | ICD-10-CM | POA: Diagnosis not present

## 2016-06-04 DIAGNOSIS — I480 Paroxysmal atrial fibrillation: Secondary | ICD-10-CM | POA: Diagnosis not present

## 2016-06-04 DIAGNOSIS — R2681 Unsteadiness on feet: Secondary | ICD-10-CM | POA: Diagnosis not present

## 2016-06-04 DIAGNOSIS — G4700 Insomnia, unspecified: Secondary | ICD-10-CM

## 2016-06-04 NOTE — Progress Notes (Signed)
Location:  [place] Room Number: 960 Place of Service:  SNF [phone])  Provider: Marlowe Sax FNP-C   PCP: Crist Infante, MD Patient Care Team: Crist Infante, MD as PCP - General (Internal Medicine)  Extended Emergency Contact Information Primary Emergency Contact: Adams,Paula Address: [address], [place] [address] [place] Phone: [phone] Mobile Phone: [phone] Relation: Daughter Secondary Emergency Contact: Kathaleen Bury Address: [address]          [place], [place] [address] [place] Mobile Phone: [phone] Relation: Grandaughter  Code Status: Full code  Goals of care:  Advanced Directive information Advanced Directives 06/04/2016  Does Patient Have a Medical Advance Directive? No  Type of Advance Directive -  Does patient want to make changes to medical advance directive? -  Copy of [place] in Chart? -  Would patient like information on creating a medical advance directive? -  Pre-existing out of facility DNR order (yellow form or pink MOST form) -     Allergies  Allergen Reactions  . Gabapentin Other (See Comments)    UNKNOWN    Chief Complaint  Patient presents with  . Discharge Note    discharge home from [facility]     HPI:  83 y.o. female seen today at [facility] for discharge home.she was here for short term rehabilitation for post hospital admission from 05/27/2016- 06/02/2016 post fall at home. She had a left hip fracture.she underwent a left total hip replacement.She was discharge to Rehab.she has a medical history of HTN, Afib,TIA,Hypelipidemia,GERD, Impaired memory among other conditions. She is seen in her room today. She denies any acute issues this visit. She states left hip pain under control with current pain regimen.she has had unremarkable stay here in rehab. She will be discharged  home per patient's POA request.   She has worked well with PT/OT now stable for discharge home.She will be discharged home with Home health PT/OT to continue with ROM, Exercise, Gait stability and muscle strengthening. She will also require a HHRN for left hip surgical incision management. She does not require any DME states has own FWW at home. Home health services will be arranged by facility social worker prior to discharge. Prescription medication will be written x 1 month then patient to follow up with PCP in 1-2 weeks. Facility staff report no new concerns.      Past Medical History:  Diagnosis Date  . AR (allergic rhinitis)   . Arthritis   . Bilateral carotid artery stenosis    MILD --  40% BILATERAL ICA PER DOPPLER JULY 2014  . Diverticulosis   . Frequency of urination   . GERD (gastroesophageal reflux disease)   . History of colon polyps    ADENOMATOUS  . History of Helicobacter pylori infection   . History of shingles   . History of TIA (transient ischemic attack)   . Hyperlipidemia   . Hypertension   . Impaired memory   . Interstitial cystitis   . Iron deficiency   . Nocturia   . Osteopenia   . PAF (paroxysmal atrial fibrillation) ([place])    dx 07/ 2015  . Pelvic pain in female   . Urgency of urination   . Vitamin D deficiency   . Wears glasses     Past Surgical History:  Procedure Laterality Date  . ABDOMINAL HYSTERECTOMY    .  CATARACT EXTRACTION W/ INTRAOCULAR LENS  IMPLANT, BILATERAL  2009  . CYSTO WITH HYDRODISTENSION  02/24/2011   Procedure: CYSTOSCOPY/HYDRODISTENSION;  Surgeon: Reece Packer, MD;  Location: [facility];  Service: Urology;  Laterality: N/A;  INSTILLATION OF marcaine and  pyridium  . CYSTO WITH HYDRODISTENSION N/A 06/20/2013   Procedure: CYSTOSCOPY/HYDRODISTENSION/INSTALLATION OF PYRIDIUM -MARCAINE 0.5%.;  Surgeon: Reece Packer, MD;  Location: [place];  Service: Urology;  Laterality: N/A;  . CYSTO WITH  HYDRODISTENSION N/A 03/22/2014   Procedure: CYSTOSCOPY/HYDRODISTENSION INSTILLATION OF MARCAINE AND PYRDIUM ;  Surgeon: Bjorn Loser, MD;  Location: [place];  Service: Urology;  Laterality: N/A;  . CYSTO WITH HYDRODISTENSION N/A 09/17/2014   Procedure: CYSTOSCOPY HYDRODISTENSION INSTILLATION OF MARCAINE AND PYRIDIUM;  Surgeon: Bjorn Loser, MD;  Location: [place];  Service: Urology;  Laterality: N/A;  . CYSTO/ HOD/ INSTILLATION THERAPY  10-04-2008  &  05-02-2009  . CYSTO/ LEFT URETEROSCOPY W/ LEFT URETERAL DILATION  04-15-2006  . TOTAL HIP ARTHROPLASTY Left 05/29/2016   Procedure: TOTAL HIP ARTHROPLASTY ANTERIOR APPROACH;  Surgeon: Meredith Pel, MD;  Location: [place];  Service: Orthopedics;  Laterality: Left;  . TRANSTHORACIC ECHOCARDIOGRAM  07-17-2013   grade I diastolic dysfuntion/  ef 26-83%/  mild AR and MR/  trivial TR  . URETEROLITHOTOMY Bilateral 11-24-2004 LEFT  &  05-29-2006  RIGHT  . VAULT SUSPENSION WITH GRAFT/ CYSTOCELE REPAIR  09-17-2009      reports that she has never smoked. She has never used smokeless tobacco. She reports that she does not drink alcohol or use drugs. Social History   Social History  . Marital status: Widowed    Spouse name: N/A  . Number of children: N/A  . Years of education: N/A   Occupational History  . Not on file.   Social History Main Topics  . Smoking status: Never Smoker  . Smokeless tobacco: Never Used  . Alcohol use No  . Drug use: No  . Sexual activity: Not on file   Other Topics Concern  . Not on file   Social History Narrative  . No narrative on file    Allergies  Allergen Reactions  . Gabapentin Other (See Comments)    UNKNOWN    Pertinent  Health Maintenance Due  Topic Date Due  . DEXA SCAN  02/17/2000  . PNA vac Low Risk Adult (1 of 2 - PCV13) 02/17/2000  . INFLUENZA VACCINE  08/05/2016    Medications: Allergies as of 06/04/2016      Reactions   Gabapentin Other  (See Comments)   UNKNOWN      Medication List       Accurate as of 06/04/16  1:41 PM. Always use your most recent med list.          alendronate 70 MG tablet Commonly known as:  FOSAMAX Take 1 tablet by mouth every 7 (seven) days.   donepezil 5 MG tablet Commonly known as:  ARICEPT Take 5 mg by mouth at bedtime.   HYDROcodone-acetaminophen 5-325 MG tablet Commonly known as:  NORCO/VICODIN Take 1 tablet by mouth every 6 (six) hours as needed for moderate pain.   hydrOXYzine 25 MG tablet Commonly known as:  ATARAX/VISTARIL Take 50 mg by mouth at bedtime.   losartan 50 MG tablet Commonly known as:  COZAAR Take 50 mg by mouth every morning.   mirabegron ER 50 MG Tb24 tablet Commonly known as:  MYRBETRIQ Take 50 mg by mouth daily.  pantoprazole 40 MG tablet Commonly known as:  PROTONIX Take 40 mg by mouth every morning.   pentosan polysulfate 100 MG capsule Commonly known as:  ELMIRON Take 200 mg by mouth 2 (two) times daily.   Rivaroxaban 15 MG Tabs tablet Commonly known as:  XARELTO Take 1 tablet (15 mg total) by mouth daily with supper.   vitamin B-12 1000 MCG tablet Commonly known as:  CYANOCOBALAMIN Take 1,000 mcg by mouth daily.   Vitamin D3 1000 units Caps Take 1,000 Units by mouth daily.   zolpidem 5 MG tablet Commonly known as:  AMBIEN Take 1 tablet (5 mg total) by mouth at bedtime as needed for sleep.       Review of Systems  Constitutional: Negative for activity change, chills, fatigue and fever.  HENT: Negative for congestion, rhinorrhea, sinus pain, sinus pressure, sneezing and sore throat.   Eyes: Negative.   Respiratory: Negative for cough, chest tightness, shortness of breath and wheezing.   Cardiovascular: Negative for chest pain, palpitations and leg swelling.  Gastrointestinal: Negative for abdominal distention, abdominal pain, constipation, diarrhea, nausea and vomiting.  Endocrine: Negative.   Genitourinary: Negative for dysuria,  flank pain, frequency and urgency.  Musculoskeletal: Positive for gait problem.       Left hip pain under control with current regimen  Skin: Negative for color change, pallor and rash.       Left hip surgical incision drsg managed by wound care Nurse   Neurological: Negative for dizziness, seizures, syncope, light-headedness and headaches.  Hematological: Does not bruise/bleed easily.  Psychiatric/Behavioral: Negative for agitation, confusion and sleep disturbance. The patient is not nervous/anxious.     Vitals:   06/04/16 1142  BP: 134/63  Pulse: 86  Resp: 20  Temp: 98.4 F (36.9 C)  Weight: 148 lb (67.1 kg)  Height: 5\' 4"  (1.626 m)   Body mass index is 25.4 kg/m. Physical Exam  Constitutional: She is oriented to person, place, and time. She appears well-developed and well-nourished. No distress.  HENT:  Head: Normocephalic.  Mouth/Throat: Oropharynx is clear and moist. No oropharyngeal exudate.  Eyes: Conjunctivae and EOM are normal. Pupils are equal, round, and reactive to light. Right eye exhibits no discharge. Left eye exhibits no discharge. No scleral icterus.  Neck: Normal range of motion. No JVD present. No thyromegaly present.  Cardiovascular: Normal rate, regular rhythm and intact distal pulses.  Exam reveals no gallop and no friction rub.   No murmur heard. Pulmonary/Chest: Effort normal and breath sounds normal. No respiratory distress. She has no wheezes. She has no rales.  Abdominal: Soft. Bowel sounds are normal. She exhibits no distension. There is no tenderness. There is no rebound and no guarding.  Musculoskeletal: She exhibits no edema, tenderness or deformity.  Unsteady gait. Limited ROM to left hip due to pain   Lymphadenopathy:    She has no cervical adenopathy.  Neurological: She is oriented to person, place, and time.  Skin: Skin is warm and dry. No rash noted. No erythema. No pallor.  Psychiatric: She has a normal mood and affect.    Labs  reviewed: Basic Metabolic Panel:  Recent Labs  05/30/16 0343 05/31/16 0323 06/02/16 0512  NA 136 135 138  K 4.3 3.9 3.7  CL 106 106 107  CO2 22 23 23   GLUCOSE 135* 134* 102*  BUN 14 20 18   CREATININE 0.95 0.89 0.79  CALCIUM 7.8* 7.9* 7.9*   Liver Function Tests:  Recent Labs  09/30/15 2056 10/01/15 0627 12/21/15  1146  AST 234* 176* 30  ALT 65* 57* 20  ALKPHOS 42 38 46  BILITOT 1.5* 1.4* 0.9  PROT 6.6 5.7* 7.2  ALBUMIN 4.1 3.4* 4.3    Recent Labs  10/01/15 0627  AMMONIA 23   CBC:  Recent Labs  05/29/16 0630 05/30/16 0343  06/01/16 0534 06/01/16 1650 06/02/16 0807  WBC 7.5 8.1  < > 6.9 8.4 5.9  NEUTROABS 5.5 6.2  --   --  6.0  --   HGB 12.1 10.0*  < > 7.8* 9.0* 9.2*  HCT 35.6* 30.6*  < > 23.0* 27.0* 27.2*  MCV 89.2 90.0  < > 88.8 86.5 87.7  PLT 149* 140*  < > 163 173 176  < > = values in this interval not displayed. Cardiac Enzymes:  Recent Labs  10/01/15 0627 10/02/15 0616 10/02/15 1435  CKTOTAL 7,134* 3,245* 3,131*     Recent Labs  09/30/15 2105  GLUCAP 73   Assessment/Plan:   1. Unsteady gait  Has worked well with PT/ OT. Will discharge home PT/OT to continue with ROM, Exercise, Gait stability and muscle strengthening. No DME required has own FWW. Fall and safety precautions.  2. PAF (paroxysmal atrial fibrillation) Continue on Xarelto 15 mg daily.  3. Gastroesophageal reflux disease without esophagitis Stable. Continue on Protonix 40 mg daily.  4. Slow transit constipation Current regimen effective.   5. Essential hypertension B/p stable. Continue on losartan 50 mg daily. BMP in 1-2 weeks with PCP   6. Insomnia Continue on Zolpidem 5 mg QHS as needed.Encourage melatonin instead of Zolpidem due to high risk for falls.   7. S/p left total hip replacement Has worked with PT/OT.Will discharge home PT/OT to continue with ROM, Exercise, Gait stability and muscle strengthening.Surgical drsg dry, clean and intact. Will need HH RN  for  surgical incision dressing change management.On Xarelto 15 mg daily.continue on Hydrocodone-APAP 5/325 mg tablet one tablet every 6 hours as needed for pain. Wean off narcotic as tolerated. Continue to follow up with Ortho specialist as directed. CBC/diff in 1-2 weeks with PCP   Patient is being discharged with the following home health services:   -PT/OT for ROM, exercise, gait stability and muscle strengthening  -  HH RN  for left hip surgical incision management.  Patient is being discharged with the following durable medical equipment:   - None required  Patient has been advised to f/u with their PCP in 1-2 weeks to for a transitions of care visit.Social services at their facility was responsible for arranging this appointment.  Pt was provided with adequate prescriptions of noncontrolled medications to reach the scheduled appointment.For controlled substances, a limited supply was provided as appropriate for the individual patient. If the pt normally receives these medications from a pain clinic or has a contract with another physician, these medications should be received from that clinic or physician only).    Future labs/tests needed:  CBC/diff, BMP in 1-2 weeks PCP

## 2016-06-06 DIAGNOSIS — W0110XD Fall on same level from slipping, tripping and stumbling with subsequent striking against unspecified object, subsequent encounter: Secondary | ICD-10-CM | POA: Diagnosis not present

## 2016-06-06 DIAGNOSIS — I480 Paroxysmal atrial fibrillation: Secondary | ICD-10-CM | POA: Diagnosis not present

## 2016-06-06 DIAGNOSIS — Z87440 Personal history of urinary (tract) infections: Secondary | ICD-10-CM | POA: Diagnosis not present

## 2016-06-06 DIAGNOSIS — S72012D Unspecified intracapsular fracture of left femur, subsequent encounter for closed fracture with routine healing: Secondary | ICD-10-CM | POA: Diagnosis not present

## 2016-06-06 DIAGNOSIS — I10 Essential (primary) hypertension: Secondary | ICD-10-CM | POA: Diagnosis not present

## 2016-06-06 DIAGNOSIS — F0390 Unspecified dementia without behavioral disturbance: Secondary | ICD-10-CM | POA: Diagnosis not present

## 2016-06-08 DIAGNOSIS — W0110XD Fall on same level from slipping, tripping and stumbling with subsequent striking against unspecified object, subsequent encounter: Secondary | ICD-10-CM | POA: Diagnosis not present

## 2016-06-08 DIAGNOSIS — Z87440 Personal history of urinary (tract) infections: Secondary | ICD-10-CM | POA: Diagnosis not present

## 2016-06-08 DIAGNOSIS — S72012D Unspecified intracapsular fracture of left femur, subsequent encounter for closed fracture with routine healing: Secondary | ICD-10-CM | POA: Diagnosis not present

## 2016-06-08 DIAGNOSIS — I10 Essential (primary) hypertension: Secondary | ICD-10-CM | POA: Diagnosis not present

## 2016-06-08 DIAGNOSIS — F0390 Unspecified dementia without behavioral disturbance: Secondary | ICD-10-CM | POA: Diagnosis not present

## 2016-06-08 DIAGNOSIS — I480 Paroxysmal atrial fibrillation: Secondary | ICD-10-CM | POA: Diagnosis not present

## 2016-06-09 ENCOUNTER — Telehealth (INDEPENDENT_AMBULATORY_CARE_PROVIDER_SITE_OTHER): Payer: Self-pay

## 2016-06-09 DIAGNOSIS — S72012D Unspecified intracapsular fracture of left femur, subsequent encounter for closed fracture with routine healing: Secondary | ICD-10-CM | POA: Diagnosis not present

## 2016-06-09 DIAGNOSIS — Z87440 Personal history of urinary (tract) infections: Secondary | ICD-10-CM | POA: Diagnosis not present

## 2016-06-09 DIAGNOSIS — I480 Paroxysmal atrial fibrillation: Secondary | ICD-10-CM | POA: Diagnosis not present

## 2016-06-09 DIAGNOSIS — F0390 Unspecified dementia without behavioral disturbance: Secondary | ICD-10-CM | POA: Diagnosis not present

## 2016-06-09 DIAGNOSIS — I10 Essential (primary) hypertension: Secondary | ICD-10-CM | POA: Diagnosis not present

## 2016-06-09 DIAGNOSIS — W0110XD Fall on same level from slipping, tripping and stumbling with subsequent striking against unspecified object, subsequent encounter: Secondary | ICD-10-CM | POA: Diagnosis not present

## 2016-06-09 NOTE — Telephone Encounter (Signed)
Requested verbal ok to eval and treat pt s/p total hip 05/29/16. She was a resident at [facility] and sustained a fall. Pt's family had her d/c facility and she is home now and needing physical therapy s/p total hip. Gave verbal ok for this and message is just [facility]

## 2016-06-10 DIAGNOSIS — I10 Essential (primary) hypertension: Secondary | ICD-10-CM | POA: Diagnosis not present

## 2016-06-10 DIAGNOSIS — F0390 Unspecified dementia without behavioral disturbance: Secondary | ICD-10-CM | POA: Diagnosis not present

## 2016-06-10 DIAGNOSIS — Z87440 Personal history of urinary (tract) infections: Secondary | ICD-10-CM | POA: Diagnosis not present

## 2016-06-10 DIAGNOSIS — I480 Paroxysmal atrial fibrillation: Secondary | ICD-10-CM | POA: Diagnosis not present

## 2016-06-10 DIAGNOSIS — W0110XD Fall on same level from slipping, tripping and stumbling with subsequent striking against unspecified object, subsequent encounter: Secondary | ICD-10-CM | POA: Diagnosis not present

## 2016-06-10 DIAGNOSIS — S72012D Unspecified intracapsular fracture of left femur, subsequent encounter for closed fracture with routine healing: Secondary | ICD-10-CM | POA: Diagnosis not present

## 2016-06-11 ENCOUNTER — Ambulatory Visit (INDEPENDENT_AMBULATORY_CARE_PROVIDER_SITE_OTHER): Payer: Self-pay

## 2016-06-11 ENCOUNTER — Encounter (INDEPENDENT_AMBULATORY_CARE_PROVIDER_SITE_OTHER): Payer: Self-pay | Admitting: Orthopedic Surgery

## 2016-06-11 ENCOUNTER — Ambulatory Visit (INDEPENDENT_AMBULATORY_CARE_PROVIDER_SITE_OTHER): Payer: Medicare Other | Admitting: Orthopedic Surgery

## 2016-06-11 DIAGNOSIS — S72002D Fracture of unspecified part of neck of left femur, subsequent encounter for closed fracture with routine healing: Secondary | ICD-10-CM

## 2016-06-11 NOTE — Progress Notes (Signed)
Post-Op Visit Note   Patient: Christine [name]           Date of Birth: 08-Dec-1933           MRN: [id number] Visit Date: 06/11/2016 PCP: Christine Ran, MD   Assessment & Plan:  Chief Complaint:  Chief Complaint  Patient presents with  . Left Hip - Routine Post Op   Visit Diagnoses:  1. Closed fracture of left hip with routine healing, subsequent encounter     Plan: Christine [name] is a 83 year old patient who is now about 2 weeks out left total hip replacement for fracture.  In general she's doing well.  Strength is improving.  She has people at home.  On exam incision is intact leg lengths approximately equal although that left side may be slightly less than a centimeter longer than the right.  This is what looks like on plain radiographs which show the prosthesis to be in good position.  Plan is continue home health physical therapy with return to clinic in 4 weeks  Follow-Up Instructions: Return in about 4 weeks (around 07/09/2016).   Orders:  Orders Placed This Encounter  Procedures  . XR HIP UNILAT W OR W/O PELVIS 2-3 VIEWS LEFT   No orders of the defined types were placed in this encounter.   Imaging: Xr Hip Unilat W Or W/o Pelvis 2-3 Views Left  Result Date: 06/11/2016 AP pelvis lateral left hip reviewed.  Total hip prosthesis in good position and alignment.  The palpating features.  Remainder bony pelvis normal.   PMFS History: Patient Active Problem List   Diagnosis Date Noted  . Hip fracture (HCC) 05/29/2016  . Closed left hip fracture (HCC) 05/27/2016  . Acute kidney injury (HCC) 10/01/2015  . Acute encephalopathy 10/01/2015  . Interstitial cystitis 10/01/2015  . Essential hypertension 10/01/2015  . Rhabdomyolysis 09/30/2015  . UTI (lower urinary tract infection) 07/16/2013  . PAF (paroxysmal atrial fibrillation) (HCC) 07/16/2013  . Chest pain 07/16/2013  . ABDOMINAL PAIN, UNSPECIFIED SITE 02/03/2010  . DYSPEPSIA 01/18/2008  . ADENOMATOUS COLONIC POLYP 01/17/2008    . HEMORRHOIDS, INTERNAL 01/17/2008  . ESOPHAGITIS 01/17/2008  . DIVERTICULOSIS, COLON 01/17/2008  . HEMORRHOIDS-EXTERNAL 12/07/2007  . GERD 12/07/2007  . Constipation 12/07/2007  . RECTAL BLEEDING 12/07/2007  . Loss of weight 12/07/2007  . Nausea alone 12/07/2007  . HEARTBURN 12/07/2007  . ABDOMINAL BLOATING 12/07/2007  . PERSONAL HX COLONIC POLYPS 12/07/2007   Past Medical History:  Diagnosis Date  . AR (allergic rhinitis)   . Arthritis   . Bilateral carotid artery stenosis    MILD --  40% BILATERAL ICA PER DOPPLER JULY 2014  . Diverticulosis   . Frequency of urination   . GERD (gastroesophageal reflux disease)   . History of colon polyps    ADENOMATOUS  . History of Helicobacter pylori infection   . History of shingles   . History of TIA (transient ischemic attack)   . Hyperlipidemia   . Hypertension   . Impaired memory   . Interstitial cystitis   . Iron deficiency   . Nocturia   . Osteopenia   . PAF (paroxysmal atrial fibrillation) (HCC)    dx 07/ 2015  . Pelvic pain in female   . Urgency of urination   . Vitamin D deficiency   . Wears glasses     No family history on file.  Past Surgical History:  Procedure Laterality Date  . ABDOMINAL HYSTERECTOMY    . CATARACT EXTRACTION W/ INTRAOCULAR LENS  IMPLANT, BILATERAL  2009  . CYSTO WITH HYDRODISTENSION  02/24/2011   Procedure: CYSTOSCOPY/HYDRODISTENSION;  Surgeon: Martina Sinner, MD;  [facility];  Service: Urology;  Laterality: N/A;  INSTILLATION OF marcaine and  pyridium  . CYSTO WITH HYDRODISTENSION N/A 06/20/2013   Procedure: CYSTOSCOPY/HYDRODISTENSION/INSTALLATION OF PYRIDIUM -MARCAINE 0.5%.;  Surgeon: Martina Sinner, MD;  Location: [facility] [place];  Service: Urology;  Laterality: N/A;  . CYSTO WITH HYDRODISTENSION N/A 03/22/2014   Procedure: CYSTOSCOPY/HYDRODISTENSION INSTILLATION OF MARCAINE AND PYRDIUM ;  Surgeon: Alfredo Martinez, MD;  [facility];  Service: Urology;  Laterality: N/A;  . CYSTO WITH HYDRODISTENSION N/A 09/17/2014   Procedure: CYSTOSCOPY HYDRODISTENSION INSTILLATION OF MARCAINE AND PYRIDIUM;  Surgeon: Alfredo Martinez, MD;  Location: [facility] [place];  Service: Urology;  Laterality: N/A;  . CYSTO/ HOD/ INSTILLATION THERAPY  10-04-2008  &  05-02-2009  . CYSTO/ LEFT URETEROSCOPY W/ LEFT URETERAL DILATION  04-15-2006  . TOTAL HIP ARTHROPLASTY Left 05/29/2016   Procedure: TOTAL HIP ARTHROPLASTY ANTERIOR APPROACH;  Surgeon: Cammy Copa, MD;  Location: [facility];  Service: Orthopedics;  Laterality: Left;  . TRANSTHORACIC ECHOCARDIOGRAM  07-17-2013   grade I diastolic dysfuntion/  ef 60-65%/  mild AR and MR/  trivial TR  . URETEROLITHOTOMY Bilateral 11-24-2004 LEFT  &  05-29-2006  RIGHT  . VAULT SUSPENSION WITH GRAFT/ CYSTOCELE REPAIR  09-17-2009   Social History   Occupational History  . Not on file.   Social History Main Topics  . Smoking status: Never Smoker  . Smokeless tobacco: Never Used  . Alcohol use No  . Drug use: No  . Sexual activity: Not on file

## 2016-06-12 DIAGNOSIS — Z87440 Personal history of urinary (tract) infections: Secondary | ICD-10-CM | POA: Diagnosis not present

## 2016-06-12 DIAGNOSIS — I10 Essential (primary) hypertension: Secondary | ICD-10-CM | POA: Diagnosis not present

## 2016-06-12 DIAGNOSIS — F0390 Unspecified dementia without behavioral disturbance: Secondary | ICD-10-CM | POA: Diagnosis not present

## 2016-06-12 DIAGNOSIS — W0110XD Fall on same level from slipping, tripping and stumbling with subsequent striking against unspecified object, subsequent encounter: Secondary | ICD-10-CM | POA: Diagnosis not present

## 2016-06-12 DIAGNOSIS — I480 Paroxysmal atrial fibrillation: Secondary | ICD-10-CM | POA: Diagnosis not present

## 2016-06-12 DIAGNOSIS — S72012D Unspecified intracapsular fracture of left femur, subsequent encounter for closed fracture with routine healing: Secondary | ICD-10-CM | POA: Diagnosis not present

## 2016-06-15 ENCOUNTER — Inpatient Hospital Stay (INDEPENDENT_AMBULATORY_CARE_PROVIDER_SITE_OTHER): Payer: Medicare Other | Admitting: Orthopedic Surgery

## 2016-06-15 DIAGNOSIS — Z87440 Personal history of urinary (tract) infections: Secondary | ICD-10-CM | POA: Diagnosis not present

## 2016-06-15 DIAGNOSIS — W0110XD Fall on same level from slipping, tripping and stumbling with subsequent striking against unspecified object, subsequent encounter: Secondary | ICD-10-CM | POA: Diagnosis not present

## 2016-06-15 DIAGNOSIS — F0390 Unspecified dementia without behavioral disturbance: Secondary | ICD-10-CM | POA: Diagnosis not present

## 2016-06-15 DIAGNOSIS — I480 Paroxysmal atrial fibrillation: Secondary | ICD-10-CM | POA: Diagnosis not present

## 2016-06-15 DIAGNOSIS — S72012D Unspecified intracapsular fracture of left femur, subsequent encounter for closed fracture with routine healing: Secondary | ICD-10-CM | POA: Diagnosis not present

## 2016-06-15 DIAGNOSIS — I10 Essential (primary) hypertension: Secondary | ICD-10-CM | POA: Diagnosis not present

## 2016-06-17 DIAGNOSIS — I480 Paroxysmal atrial fibrillation: Secondary | ICD-10-CM | POA: Diagnosis not present

## 2016-06-17 DIAGNOSIS — W0110XD Fall on same level from slipping, tripping and stumbling with subsequent striking against unspecified object, subsequent encounter: Secondary | ICD-10-CM | POA: Diagnosis not present

## 2016-06-17 DIAGNOSIS — S72012D Unspecified intracapsular fracture of left femur, subsequent encounter for closed fracture with routine healing: Secondary | ICD-10-CM | POA: Diagnosis not present

## 2016-06-17 DIAGNOSIS — F0390 Unspecified dementia without behavioral disturbance: Secondary | ICD-10-CM | POA: Diagnosis not present

## 2016-06-17 DIAGNOSIS — I10 Essential (primary) hypertension: Secondary | ICD-10-CM | POA: Diagnosis not present

## 2016-06-17 DIAGNOSIS — Z87440 Personal history of urinary (tract) infections: Secondary | ICD-10-CM | POA: Diagnosis not present

## 2016-06-19 DIAGNOSIS — S72012D Unspecified intracapsular fracture of left femur, subsequent encounter for closed fracture with routine healing: Secondary | ICD-10-CM | POA: Diagnosis not present

## 2016-06-19 DIAGNOSIS — W0110XD Fall on same level from slipping, tripping and stumbling with subsequent striking against unspecified object, subsequent encounter: Secondary | ICD-10-CM | POA: Diagnosis not present

## 2016-06-19 DIAGNOSIS — F0390 Unspecified dementia without behavioral disturbance: Secondary | ICD-10-CM | POA: Diagnosis not present

## 2016-06-19 DIAGNOSIS — I480 Paroxysmal atrial fibrillation: Secondary | ICD-10-CM | POA: Diagnosis not present

## 2016-06-19 DIAGNOSIS — Z87440 Personal history of urinary (tract) infections: Secondary | ICD-10-CM | POA: Diagnosis not present

## 2016-06-19 DIAGNOSIS — I10 Essential (primary) hypertension: Secondary | ICD-10-CM | POA: Diagnosis not present

## 2016-06-20 DIAGNOSIS — W0110XD Fall on same level from slipping, tripping and stumbling with subsequent striking against unspecified object, subsequent encounter: Secondary | ICD-10-CM | POA: Diagnosis not present

## 2016-06-20 DIAGNOSIS — I480 Paroxysmal atrial fibrillation: Secondary | ICD-10-CM | POA: Diagnosis not present

## 2016-06-20 DIAGNOSIS — S72012D Unspecified intracapsular fracture of left femur, subsequent encounter for closed fracture with routine healing: Secondary | ICD-10-CM | POA: Diagnosis not present

## 2016-06-20 DIAGNOSIS — I10 Essential (primary) hypertension: Secondary | ICD-10-CM | POA: Diagnosis not present

## 2016-06-20 DIAGNOSIS — Z87440 Personal history of urinary (tract) infections: Secondary | ICD-10-CM | POA: Diagnosis not present

## 2016-06-20 DIAGNOSIS — F0390 Unspecified dementia without behavioral disturbance: Secondary | ICD-10-CM | POA: Diagnosis not present

## 2016-06-22 DIAGNOSIS — I10 Essential (primary) hypertension: Secondary | ICD-10-CM | POA: Diagnosis not present

## 2016-06-22 DIAGNOSIS — Z87440 Personal history of urinary (tract) infections: Secondary | ICD-10-CM | POA: Diagnosis not present

## 2016-06-22 DIAGNOSIS — I480 Paroxysmal atrial fibrillation: Secondary | ICD-10-CM | POA: Diagnosis not present

## 2016-06-22 DIAGNOSIS — S72012D Unspecified intracapsular fracture of left femur, subsequent encounter for closed fracture with routine healing: Secondary | ICD-10-CM | POA: Diagnosis not present

## 2016-06-22 DIAGNOSIS — F0390 Unspecified dementia without behavioral disturbance: Secondary | ICD-10-CM | POA: Diagnosis not present

## 2016-06-22 DIAGNOSIS — W0110XD Fall on same level from slipping, tripping and stumbling with subsequent striking against unspecified object, subsequent encounter: Secondary | ICD-10-CM | POA: Diagnosis not present

## 2016-06-23 DIAGNOSIS — S72012D Unspecified intracapsular fracture of left femur, subsequent encounter for closed fracture with routine healing: Secondary | ICD-10-CM | POA: Diagnosis not present

## 2016-06-23 DIAGNOSIS — Z87440 Personal history of urinary (tract) infections: Secondary | ICD-10-CM | POA: Diagnosis not present

## 2016-06-23 DIAGNOSIS — W0110XD Fall on same level from slipping, tripping and stumbling with subsequent striking against unspecified object, subsequent encounter: Secondary | ICD-10-CM | POA: Diagnosis not present

## 2016-06-23 DIAGNOSIS — I480 Paroxysmal atrial fibrillation: Secondary | ICD-10-CM | POA: Diagnosis not present

## 2016-06-23 DIAGNOSIS — I10 Essential (primary) hypertension: Secondary | ICD-10-CM | POA: Diagnosis not present

## 2016-06-23 DIAGNOSIS — F0390 Unspecified dementia without behavioral disturbance: Secondary | ICD-10-CM | POA: Diagnosis not present

## 2016-06-24 DIAGNOSIS — I10 Essential (primary) hypertension: Secondary | ICD-10-CM | POA: Diagnosis not present

## 2016-06-24 DIAGNOSIS — F0390 Unspecified dementia without behavioral disturbance: Secondary | ICD-10-CM | POA: Diagnosis not present

## 2016-06-24 DIAGNOSIS — W0110XD Fall on same level from slipping, tripping and stumbling with subsequent striking against unspecified object, subsequent encounter: Secondary | ICD-10-CM | POA: Diagnosis not present

## 2016-06-24 DIAGNOSIS — S72012D Unspecified intracapsular fracture of left femur, subsequent encounter for closed fracture with routine healing: Secondary | ICD-10-CM | POA: Diagnosis not present

## 2016-06-24 DIAGNOSIS — I480 Paroxysmal atrial fibrillation: Secondary | ICD-10-CM | POA: Diagnosis not present

## 2016-06-24 DIAGNOSIS — Z87440 Personal history of urinary (tract) infections: Secondary | ICD-10-CM | POA: Diagnosis not present

## 2016-06-25 DIAGNOSIS — W0110XD Fall on same level from slipping, tripping and stumbling with subsequent striking against unspecified object, subsequent encounter: Secondary | ICD-10-CM | POA: Diagnosis not present

## 2016-06-25 DIAGNOSIS — Z87440 Personal history of urinary (tract) infections: Secondary | ICD-10-CM | POA: Diagnosis not present

## 2016-06-25 DIAGNOSIS — I480 Paroxysmal atrial fibrillation: Secondary | ICD-10-CM | POA: Diagnosis not present

## 2016-06-25 DIAGNOSIS — S72012D Unspecified intracapsular fracture of left femur, subsequent encounter for closed fracture with routine healing: Secondary | ICD-10-CM | POA: Diagnosis not present

## 2016-06-25 DIAGNOSIS — F0390 Unspecified dementia without behavioral disturbance: Secondary | ICD-10-CM | POA: Diagnosis not present

## 2016-06-25 DIAGNOSIS — I10 Essential (primary) hypertension: Secondary | ICD-10-CM | POA: Diagnosis not present

## 2016-07-02 DIAGNOSIS — I480 Paroxysmal atrial fibrillation: Secondary | ICD-10-CM | POA: Diagnosis not present

## 2016-07-02 DIAGNOSIS — W0110XD Fall on same level from slipping, tripping and stumbling with subsequent striking against unspecified object, subsequent encounter: Secondary | ICD-10-CM | POA: Diagnosis not present

## 2016-07-02 DIAGNOSIS — Z87440 Personal history of urinary (tract) infections: Secondary | ICD-10-CM | POA: Diagnosis not present

## 2016-07-02 DIAGNOSIS — I10 Essential (primary) hypertension: Secondary | ICD-10-CM | POA: Diagnosis not present

## 2016-07-02 DIAGNOSIS — S72012D Unspecified intracapsular fracture of left femur, subsequent encounter for closed fracture with routine healing: Secondary | ICD-10-CM | POA: Diagnosis not present

## 2016-07-02 DIAGNOSIS — F0390 Unspecified dementia without behavioral disturbance: Secondary | ICD-10-CM | POA: Diagnosis not present

## 2016-07-03 DIAGNOSIS — F0390 Unspecified dementia without behavioral disturbance: Secondary | ICD-10-CM | POA: Diagnosis not present

## 2016-07-03 DIAGNOSIS — Z87440 Personal history of urinary (tract) infections: Secondary | ICD-10-CM | POA: Diagnosis not present

## 2016-07-03 DIAGNOSIS — S72012D Unspecified intracapsular fracture of left femur, subsequent encounter for closed fracture with routine healing: Secondary | ICD-10-CM | POA: Diagnosis not present

## 2016-07-03 DIAGNOSIS — I10 Essential (primary) hypertension: Secondary | ICD-10-CM | POA: Diagnosis not present

## 2016-07-03 DIAGNOSIS — W0110XD Fall on same level from slipping, tripping and stumbling with subsequent striking against unspecified object, subsequent encounter: Secondary | ICD-10-CM | POA: Diagnosis not present

## 2016-07-03 DIAGNOSIS — I480 Paroxysmal atrial fibrillation: Secondary | ICD-10-CM | POA: Diagnosis not present

## 2016-07-06 DIAGNOSIS — M6282 Rhabdomyolysis: Secondary | ICD-10-CM | POA: Diagnosis not present

## 2016-07-06 DIAGNOSIS — I10 Essential (primary) hypertension: Secondary | ICD-10-CM | POA: Diagnosis not present

## 2016-07-06 DIAGNOSIS — E784 Other hyperlipidemia: Secondary | ICD-10-CM | POA: Diagnosis not present

## 2016-07-06 DIAGNOSIS — Z6821 Body mass index (BMI) 21.0-21.9, adult: Secondary | ICD-10-CM | POA: Diagnosis not present

## 2016-07-06 DIAGNOSIS — K219 Gastro-esophageal reflux disease without esophagitis: Secondary | ICD-10-CM | POA: Diagnosis not present

## 2016-07-06 DIAGNOSIS — M810 Age-related osteoporosis without current pathological fracture: Secondary | ICD-10-CM | POA: Insufficient documentation

## 2016-07-09 ENCOUNTER — Ambulatory Visit (INDEPENDENT_AMBULATORY_CARE_PROVIDER_SITE_OTHER): Payer: Medicare Other | Admitting: Orthopedic Surgery

## 2016-07-09 ENCOUNTER — Encounter (INDEPENDENT_AMBULATORY_CARE_PROVIDER_SITE_OTHER): Payer: Self-pay | Admitting: Orthopedic Surgery

## 2016-07-09 DIAGNOSIS — S72002D Fracture of unspecified part of neck of left femur, subsequent encounter for closed fracture with routine healing: Secondary | ICD-10-CM

## 2016-07-09 MED ORDER — AMOXICILLIN 500 MG PO CAPS: ORAL_CAPSULE | ORAL | 2 refills | Status: DC

## 2016-07-09 NOTE — Progress Notes (Signed)
Post-Op Visit Note   Patient: Christine [name]           Date of Birth: 04/10/1933           MRN: [id number] Visit Date: 07/09/2016 PCP: Rodrigo Ran, MD   Assessment & Plan:  Chief Complaint:  Chief Complaint  Patient presents with  . Left Hip - Routine Post Op   Visit Diagnoses:  1. Closed fracture of left hip with routine healing, subsequent encounter     Plan: Christine [name] is an 83 year old patient who is now 6 weeks out left total hip replacement for hip fracture.  She's been doing well.  On exam she has pretty normal gait without assistive device.  Taking tramadol for symptoms.  She has excellent hip flexion strength bilaterally.  No real groin pain with internal/external rotation of the leg.  Mild paresthesias in that lateral femoral cutaneous distribution which is not unexpected.  She is doing all of her ADLs according to her daughter.  Plan at this time to me with her own self directed rehabilitation.  She's finished with home health PT and doesn't really believe outpatient therapy would be beneficial.  Based on her strength today and her gait pattern I would agree.  Need for dental prophylaxis with antibiotics discussed.  All questions answered.  Follow-up as needed  Follow-Up Instructions: Return if symptoms worsen or fail to improve.   Orders:  No orders of the defined types were placed in this encounter.  Meds ordered this encounter  Medications  . amoxicillin (AMOXIL) 500 MG capsule    Sig: Take 4 capsules 1 hour prior to dental cleaning/procedure.    Dispense:  16 capsule    Refill:  2    Imaging: No results found.  PMFS History: Patient Active Problem List   Diagnosis Date Noted  . Hip fracture (HCC) 05/29/2016  . Closed left hip fracture (HCC) 05/27/2016  . Acute kidney injury (HCC) 10/01/2015  . Acute encephalopathy 10/01/2015  . Interstitial cystitis 10/01/2015  . Essential hypertension 10/01/2015  . Rhabdomyolysis 09/30/2015  . UTI (lower urinary tract  infection) 07/16/2013  . PAF (paroxysmal atrial fibrillation) (HCC) 07/16/2013  . Chest pain 07/16/2013  . ABDOMINAL PAIN, UNSPECIFIED SITE 02/03/2010  . DYSPEPSIA 01/18/2008  . ADENOMATOUS COLONIC POLYP 01/17/2008  . HEMORRHOIDS, INTERNAL 01/17/2008  . ESOPHAGITIS 01/17/2008  . DIVERTICULOSIS, COLON 01/17/2008  . HEMORRHOIDS-EXTERNAL 12/07/2007  . GERD 12/07/2007  . Constipation 12/07/2007  . RECTAL BLEEDING 12/07/2007  . Loss of weight 12/07/2007  . Nausea alone 12/07/2007  . HEARTBURN 12/07/2007  . ABDOMINAL BLOATING 12/07/2007  . PERSONAL HX COLONIC POLYPS 12/07/2007   Past Medical History:  Diagnosis Date  . AR (allergic rhinitis)   . Arthritis   . Bilateral carotid artery stenosis    MILD --  40% BILATERAL ICA PER DOPPLER JULY 2014  . Diverticulosis   . Frequency of urination   . GERD (gastroesophageal reflux disease)   . History of colon polyps    ADENOMATOUS  . History of Helicobacter pylori infection   . History of shingles   . History of TIA (transient ischemic attack)   . Hyperlipidemia   . Hypertension   . Impaired memory   . Interstitial cystitis   . Iron deficiency   . Nocturia   . Osteopenia   . PAF (paroxysmal atrial fibrillation) (HCC)    dx 07/ 2015  . Pelvic pain in female   . Urgency of urination   . Vitamin D deficiency   .  Wears glasses     No family history on file.  Past Surgical History:  Procedure Laterality Date  . ABDOMINAL HYSTERECTOMY    . CATARACT EXTRACTION W/ INTRAOCULAR LENS  IMPLANT, BILATERAL  2009  . CYSTO WITH HYDRODISTENSION  02/24/2011   Procedure: CYSTOSCOPY/HYDRODISTENSION;  Surgeon: Martina Sinner, MD;  Location: [facility];  Service: Urology;  Laterality: N/A;  INSTILLATION OF marcaine and  pyridium  . CYSTO WITH HYDRODISTENSION N/A 06/20/2013   Procedure: CYSTOSCOPY/HYDRODISTENSION/INSTALLATION OF PYRIDIUM -MARCAINE 0.5%.;  Surgeon: Martina Sinner, MD;  Location: [facility] [place];   Service: Urology;  Laterality: N/A;  . CYSTO WITH HYDRODISTENSION N/A 03/22/2014   Procedure: CYSTOSCOPY/HYDRODISTENSION INSTILLATION OF MARCAINE AND PYRDIUM ;  Surgeon: Alfredo Martinez, MD;  Location: [facility] [place];  Service: Urology;  Laterality: N/A;  . CYSTO WITH HYDRODISTENSION N/A 09/17/2014   Procedure: CYSTOSCOPY HYDRODISTENSION INSTILLATION OF MARCAINE AND PYRIDIUM;  Surgeon: Alfredo Martinez, MD;  Location: [facility] [place];  Service: Urology;  Laterality: N/A;  . CYSTO/ HOD/ INSTILLATION THERAPY  10-04-2008  &  05-02-2009  . CYSTO/ LEFT URETEROSCOPY W/ LEFT URETERAL DILATION  04-15-2006  . TOTAL HIP ARTHROPLASTY Left 05/29/2016   Procedure: TOTAL HIP ARTHROPLASTY ANTERIOR APPROACH;  Surgeon: Cammy Copa, MD;  Location[facility];  Service: Orthopedics;  Laterality: Left;  . TRANSTHORACIC ECHOCARDIOGRAM  07-17-2013   grade I diastolic dysfuntion/  ef 60-65%/  mild AR and MR/  trivial TR  . URETEROLITHOTOMY Bilateral 11-24-2004 LEFT  &  05-29-2006  RIGHT  . VAULT SUSPENSION WITH GRAFT/ CYSTOCELE REPAIR  09-17-2009   Social History   Occupational History  . Not on file.   Social History Main Topics  . Smoking status: Never Smoker  . Smokeless tobacco: Never Used  . Alcohol use No  . Drug use: No  . Sexual activity: Not on file

## 2016-07-17 DIAGNOSIS — N390 Urinary tract infection, site not specified: Secondary | ICD-10-CM | POA: Diagnosis not present

## 2016-07-28 DIAGNOSIS — M810 Age-related osteoporosis without current pathological fracture: Secondary | ICD-10-CM | POA: Diagnosis not present

## 2016-08-04 DIAGNOSIS — Z6821 Body mass index (BMI) 21.0-21.9, adult: Secondary | ICD-10-CM | POA: Diagnosis not present

## 2016-08-04 DIAGNOSIS — H6123 Impacted cerumen, bilateral: Secondary | ICD-10-CM | POA: Diagnosis not present

## 2016-08-04 DIAGNOSIS — H6120 Impacted cerumen, unspecified ear: Secondary | ICD-10-CM | POA: Insufficient documentation

## 2016-08-05 ENCOUNTER — Ambulatory Visit (INDEPENDENT_AMBULATORY_CARE_PROVIDER_SITE_OTHER): Payer: Medicare Other | Admitting: Orthopedic Surgery

## 2016-08-05 ENCOUNTER — Ambulatory Visit (INDEPENDENT_AMBULATORY_CARE_PROVIDER_SITE_OTHER): Payer: Medicare Other

## 2016-08-05 DIAGNOSIS — M79662 Pain in left lower leg: Secondary | ICD-10-CM | POA: Diagnosis not present

## 2016-08-05 NOTE — Progress Notes (Signed)
Post-Op Visit Note   Patient: Christine [name]           Date of Birth: [birth year]           MRN: [id number] Visit Date: 08/05/2016 PCP: Rodrigo Ran, MD   Assessment & Plan:  Chief Complaint:  Chief Complaint  Patient presents with  . Left Leg - Pain   Visit Diagnoses:  1. Pain in left lower leg     Plan: Taigen is a patient who is now 2 months out left total hip replacement for fracture.  She's been doing reasonably well.  She is reporting some left leg pain however as well as bilateral leg pain.  Still is having little bit of numbness on that left side.  Think that should improve.  On exam she has good hip flexion abduction and adduction strength no nerve retention signs no groin pain on either side with rotation of the hip.  I think that left leg is slightly less than a centimeter longer than the right.  I noticed it most with gait.  She is able to walk 30 minutes but then pain in the legs both sides base are stopped.  She's had previous back injections with good results.  Plan at this time is to refer her to Dr. Alvester Morin for further back injections.  I'll see her back as needed  Follow-Up Instructions: No Follow-up on file.   Orders:  Orders Placed This Encounter  Procedures  . XR Tibia/Fibula Left  . Ambulatory referral to Physical Medicine Rehab   No orders of the defined types were placed in this encounter.   Imaging: Xr Tibia/fibula Left  Result Date: 08/05/2016 AP lateral left tib-fib reviewed.  No fracture noted.  No significant arthritis in the knee or ankle joint noted.  No abnormalities.  Things appear slightly osteopenic.   PMFS History: Patient Active Problem List   Diagnosis Date Noted  . Hip fracture (HCC) 05/29/2016  . Closed left hip fracture (HCC) 05/27/2016  . Acute kidney injury (HCC) 10/01/2015  . Acute encephalopathy 10/01/2015  . Interstitial cystitis 10/01/2015  . Essential hypertension 10/01/2015  . Rhabdomyolysis 09/30/2015  . UTI (lower  urinary tract infection) 07/16/2013  . PAF (paroxysmal atrial fibrillation) (HCC) 07/16/2013  . Chest pain 07/16/2013  . ABDOMINAL PAIN, UNSPECIFIED SITE 02/03/2010  . DYSPEPSIA 01/18/2008  . ADENOMATOUS COLONIC POLYP 01/17/2008  . HEMORRHOIDS, INTERNAL 01/17/2008  . ESOPHAGITIS 01/17/2008  . DIVERTICULOSIS, COLON 01/17/2008  . HEMORRHOIDS-EXTERNAL 12/07/2007  . GERD 12/07/2007  . Constipation 12/07/2007  . RECTAL BLEEDING 12/07/2007  . Loss of weight 12/07/2007  . Nausea alone 12/07/2007  . HEARTBURN 12/07/2007  . ABDOMINAL BLOATING 12/07/2007  . PERSONAL HX COLONIC POLYPS 12/07/2007   Past Medical History:  Diagnosis Date  . AR (allergic rhinitis)   . Arthritis   . Bilateral carotid artery stenosis    MILD --  40% BILATERAL ICA PER DOPPLER JULY 2014  . Diverticulosis   . Frequency of urination   . GERD (gastroesophageal reflux disease)   . History of colon polyps    ADENOMATOUS  . History of Helicobacter pylori infection   . History of shingles   . History of TIA (transient ischemic attack)   . Hyperlipidemia   . Hypertension   . Impaired memory   . Interstitial cystitis   . Iron deficiency   . Nocturia   . Osteopenia   . PAF (paroxysmal atrial fibrillation) (HCC)    dx 07/ 2015  . Pelvic  pain in female   . Urgency of urination   . Vitamin D deficiency   . Wears glasses     No family history on file.  Past Surgical History:  Procedure Laterality Date  . ABDOMINAL HYSTERECTOMY    . CATARACT EXTRACTION W/ INTRAOCULAR LENS  IMPLANT, BILATERAL  2009  . CYSTO WITH HYDRODISTENSION  02/24/2011   Procedure: CYSTOSCOPY/HYDRODISTENSION;  Surgeon: Martina Sinner, MD;  Location: [facility];  Service: Urology;  Laterality: N/A;  INSTILLATION OF marcaine and  pyridium  . CYSTO WITH HYDRODISTENSION N/A 06/20/2013   Procedure: CYSTOSCOPY/HYDRODISTENSION/INSTALLATION OF PYRIDIUM -MARCAINE 0.5%.;  Surgeon: Martina Sinner, MD;  [facility];  Service: Urology;  Laterality: N/A;  . CYSTO WITH HYDRODISTENSION N/A 03/22/2014   Procedure: CYSTOSCOPY/HYDRODISTENSION INSTILLATION OF MARCAINE AND PYRDIUM ;  Surgeon: Alfredo Martinez, MD;  Location: [facility];  Service: Urology;  Laterality: N/A;  . CYSTO WITH HYDRODISTENSION N/A 09/17/2014   Procedure: CYSTOSCOPY HYDRODISTENSION INSTILLATION OF MARCAINE AND PYRIDIUM;  Surgeon: Alfredo Martinez, MD;  Location: [facility];  Service: Urology;  Laterality: N/A;  . CYSTO/ HOD/ INSTILLATION THERAPY  10-04-2008  &  05-02-2009  . CYSTO/ LEFT URETEROSCOPY W/ LEFT URETERAL DILATION  04-15-2006  . TOTAL HIP ARTHROPLASTY Left 05/29/2016   Procedure: TOTAL HIP ARTHROPLASTY ANTERIOR APPROACH;  Surgeon: Cammy Copa, MD;  Location: [facility];  Service: Orthopedics;  Laterality: Left;  . TRANSTHORACIC ECHOCARDIOGRAM  07-17-2013   grade I diastolic dysfuntion/  ef 60-65%/  mild AR and MR/  trivial TR  . URETEROLITHOTOMY Bilateral 11-24-2004 LEFT  &  05-29-2006  RIGHT  . VAULT SUSPENSION WITH GRAFT/ CYSTOCELE REPAIR  09-17-2009   Social History   Occupational History  . Not on file.   Social History Main Topics  . Smoking status: Never Smoker  . Smokeless tobacco: Never Used  . Alcohol use No  . Drug use: No  . Sexual activity: Not on file

## 2016-08-19 ENCOUNTER — Ambulatory Visit (INDEPENDENT_AMBULATORY_CARE_PROVIDER_SITE_OTHER): Payer: Medicare Other

## 2016-08-19 ENCOUNTER — Encounter (INDEPENDENT_AMBULATORY_CARE_PROVIDER_SITE_OTHER): Payer: Self-pay | Admitting: Physical Medicine and Rehabilitation

## 2016-08-19 ENCOUNTER — Ambulatory Visit (INDEPENDENT_AMBULATORY_CARE_PROVIDER_SITE_OTHER): Payer: Medicare Other | Admitting: Physical Medicine and Rehabilitation

## 2016-08-19 VITALS — BP 121/73 | HR 83

## 2016-08-19 DIAGNOSIS — M5416 Radiculopathy, lumbar region: Secondary | ICD-10-CM | POA: Diagnosis not present

## 2016-08-19 DIAGNOSIS — M48062 Spinal stenosis, lumbar region with neurogenic claudication: Secondary | ICD-10-CM

## 2016-08-19 MED ORDER — LIDOCAINE HCL (PF) 1 % IJ SOLN
2.0000 mL | Freq: Once | INTRAMUSCULAR | Status: AC
  Administered 2016-08-19: 2 mL

## 2016-08-19 MED ORDER — BETAMETHASONE SOD PHOS & ACET 6 (3-3) MG/ML IJ SUSP
12.0000 mg | Freq: Once | INTRAMUSCULAR | Status: AC
  Administered 2016-08-19: 12 mg

## 2016-08-19 NOTE — Patient Instructions (Signed)

## 2016-08-19 NOTE — Progress Notes (Unsigned)
Fluoro Time: 37 sec Mgy: 19.20

## 2016-08-19 NOTE — Progress Notes (Signed)
Left leg pain. Pain with walking and standing. No back pain. Numbness and tingling down front of leg to knee  Stopped taking Xarelto on Sunday.

## 2016-08-21 NOTE — Procedures (Signed)
Christine [name] is an 83 year old female with left buttock and anterior lateral leg pain that does go past the knee into the calf on rare occasions. She's had a left total hip replacement from an anterior approach. She does have paresthesias and impaired sensation in the lateral femoral cutaneous nerve distribution on the left. She has an MRI of the lumbar spine showing multilevel spondylosis but really only with mild impingement. She has failed conservative care and continues with severe left leg pain. We are going to complete a diagnostic and hopefully therapeutic left L4 transforaminal epidural steroid injection.  Lumbosacral Transforaminal Epidural Steroid Injection - Sub-Pedicular Approach with Fluoroscopic Guidance  Patient: Christine [name]      Date of Birth: 02-03-1933 MRN: [id number] PCP: Crist Infante, MD      Visit Date: 08/19/2016   Universal Protocol:    Date/Time: 08/19/2016  Consent Given By: the patient  Position: PRONE  Additional Comments: Vital signs were monitored before and after the procedure. Patient was prepped and draped in the usual sterile fashion. The correct patient, procedure, and site was verified.   Injection Procedure Details:  Procedure Site One Meds Administered:  Meds ordered this encounter  Medications  . lidocaine (PF) (XYLOCAINE) 1 % injection 2 mL  . betamethasone acetate-betamethasone sodium phosphate (CELESTONE) injection 12 mg    Laterality: Left  Location/Site:  L4-L5  Needle size: 22 G  Needle type: Spinal  Needle Placement: Transforaminal  Findings:  -Contrast Used: 1 mL iohexol 180 mg iodine/mL   -Comments: Excellent flow of contrast along the nerve and into the epidural space. Patient with initial severe pain response with delivery of the initial amount of contrast dye and injectate. This would indicate may be more stenosis that really is what is seen on the MRI. She did have some numbness postprocedure.  Procedure  Details: After squaring off the end-plates to get a true AP view, the C-arm was positioned so that an oblique view of the foramen as noted above was visualized. The target area is just inferior to the "nose of the scotty dog" or sub pedicular. The soft tissues overlying this structure were infiltrated with 2-3 ml. of 1% Lidocaine without Epinephrine.  The spinal needle was inserted toward the target using a "trajectory" view along the fluoroscope beam.  Under AP and lateral visualization, the needle was advanced so it did not puncture dura and was located close the 6 O'Clock position of the pedical in AP tracterory. Biplanar projections were used to confirm position. Aspiration was confirmed to be negative for CSF and/or blood. A 1-2 ml. volume of Isovue-250 was injected and flow of contrast was noted at each level. Radiographs were obtained for documentation purposes.   After attaining the desired flow of contrast documented above, a 0.5 to 1.0 ml test dose of 0.25% Marcaine was injected into each respective transforaminal space.  The patient was observed for 90 seconds post injection.  After no sensory deficits were reported, and normal lower extremity motor function was noted,   the above injectate was administered so that equal amounts of the injectate were placed at each foramen (level) into the transforaminal epidural space.   Additional Comments:  No complications occurred. Dressing: Band-Aid    Post-procedure details: Patient was observed during the procedure. Post-procedure instructions were reviewed.  Patient left the clinic in stable condition.

## 2016-09-23 ENCOUNTER — Telehealth (INDEPENDENT_AMBULATORY_CARE_PROVIDER_SITE_OTHER): Payer: Self-pay | Admitting: Physical Medicine and Rehabilitation

## 2016-09-24 NOTE — Telephone Encounter (Signed)
She needs to follow with Dr. Marlou Sa. No specfific finding on MRI of lspine

## 2016-09-24 NOTE — Telephone Encounter (Signed)
Left message for daughter advising to follow up with Dr. Marlou Sa.

## 2016-09-28 ENCOUNTER — Ambulatory Visit (INDEPENDENT_AMBULATORY_CARE_PROVIDER_SITE_OTHER): Payer: Medicare Other | Admitting: Orthopedic Surgery

## 2016-09-28 ENCOUNTER — Encounter (INDEPENDENT_AMBULATORY_CARE_PROVIDER_SITE_OTHER): Payer: Self-pay | Admitting: Orthopedic Surgery

## 2016-09-28 DIAGNOSIS — M5410 Radiculopathy, site unspecified: Secondary | ICD-10-CM | POA: Diagnosis not present

## 2016-09-29 NOTE — Progress Notes (Signed)
Office Visit Note   Patient: Christine [name]           Date of Birth: [birth year]           MRN: [id number] Visit Date: 09/28/2016 Requested by: Rodrigo Ran, MD [address] [place], [place] [address] PCP: Rodrigo Ran, MD  Subjective: Chief Complaint  Patient presents with  . Left Leg - Pain    HPI: Christine [name] is an 83 year old patient with left radicular pain.  She underwent total hip replacement in May for hip fracture.  She now describes a pinching sensation running down the left leg and thigh.  Does not wake her from sleep with pain.  She takes Ultram as needed.  She has had an injection in the past and she is agreeable to try another one but she was advised she needed to come here first.  She did have one day of complete relief from the prior back injection.  She does not take pain medicine for the problem but just manages.  The pain radiates down to the foot.  She denies any groin pain.  She is not using a walker or cane.              ROS: All systems reviewed are negative as they relate to the chief complaint within the history of present illness.  Patient denies  fevers or chills.   Assessment & Plan: Visit Diagnoses:  1. Radicular leg pain     Plan: impression is radicular leg pain with 1 day of complete relief from prior back injection no groin pain clear radicular symptoms with pain radiation down to the foot and well-functioning hip replacement.  Plan is to go see Dr. Alvester Morin for 2 more injections.  I do not need to see her before she gets any more injections in the future.  Follow-Up Instructions: No Follow-up on file.   Orders:  Orders Placed This Encounter  Procedures  . Ambulatory referral to Physical Medicine Rehab   No orders of the defined types were placed in this encounter.     Procedures: No procedures performed   Clinical Data: No additional findings.  Objective: Vital Signs: There were no vitals taken for this visit.  Physical Exam:    Constitutional: Patient appears well-developed HEENT:  Head: Normocephalic Eyes:EOM are normal Neck: Normal range of motion Cardiovascular: Normal rate Pulmonary/chest: Effort normal Neurologic: Patient is alert Skin: Skin is warm Psychiatric: Patient has normal mood and affect    Ortho Exam: orthopedic exam demonstrates no groin pain with internal/external rotation of either leg.  Palpable pedal pulses.  No definite paresthesias L1 S1 bilaterally.  No other masses lymph adenopathy or skin changes noted in the left hip region.  There is no trochanteric tenderness noted.  Some pain with forward and lateral bending.  Specialty Comments:  No specialty comments available.  Imaging: No results found.   PMFS History: Patient Active Problem List   Diagnosis Date Noted  . Hip fracture (HCC) 05/29/2016  . Closed left hip fracture (HCC) 05/27/2016  . Acute kidney injury (HCC) 10/01/2015  . Acute encephalopathy 10/01/2015  . Interstitial cystitis 10/01/2015  . Essential hypertension 10/01/2015  . Rhabdomyolysis 09/30/2015  . UTI (lower urinary tract infection) 07/16/2013  . PAF (paroxysmal atrial fibrillation) (HCC) 07/16/2013  . Chest pain 07/16/2013  . ABDOMINAL PAIN, UNSPECIFIED SITE 02/03/2010  . DYSPEPSIA 01/18/2008  . ADENOMATOUS COLONIC POLYP 01/17/2008  . HEMORRHOIDS, INTERNAL 01/17/2008  . ESOPHAGITIS 01/17/2008  . DIVERTICULOSIS, COLON 01/17/2008  .  HEMORRHOIDS-EXTERNAL 12/07/2007  . GERD 12/07/2007  . Constipation 12/07/2007  . RECTAL BLEEDING 12/07/2007  . Loss of weight 12/07/2007  . Nausea alone 12/07/2007  . HEARTBURN 12/07/2007  . ABDOMINAL BLOATING 12/07/2007  . PERSONAL HX COLONIC POLYPS 12/07/2007   Past Medical History:  Diagnosis Date  . AR (allergic rhinitis)   . Arthritis   . Bilateral carotid artery stenosis    MILD --  40% BILATERAL ICA PER DOPPLER JULY 2014  . Diverticulosis   . Frequency of urination   . GERD (gastroesophageal reflux  disease)   . History of colon polyps    ADENOMATOUS  . History of Helicobacter pylori infection   . History of shingles   . History of TIA (transient ischemic attack)   . Hyperlipidemia   . Hypertension   . Impaired memory   . Interstitial cystitis   . Iron deficiency   . Nocturia   . Osteopenia   . PAF (paroxysmal atrial fibrillation) (HCC)    dx 07/ 2015  . Pelvic pain in female   . Urgency of urination   . Vitamin D deficiency   . Wears glasses     No family history on file.  Past Surgical History:  Procedure Laterality Date  . ABDOMINAL HYSTERECTOMY    . CATARACT EXTRACTION W/ INTRAOCULAR LENS  IMPLANT, BILATERAL  2009  . CYSTO WITH HYDRODISTENSION  02/24/2011   Procedure: CYSTOSCOPY/HYDRODISTENSION;  Surgeon: Martina Sinner, MD;  Location: [facility];  Service: Urology;  Laterality: N/A;  INSTILLATION OF marcaine and  pyridium  . CYSTO WITH HYDRODISTENSION N/A 06/20/2013   Procedure: CYSTOSCOPY/HYDRODISTENSION/INSTALLATION OF PYRIDIUM -MARCAINE 0.5%.;  Surgeon: Martina Sinner, MD;  Location: [facility];  Service: Urology;  Laterality: N/A;  . CYSTO WITH HYDRODISTENSION N/A 03/22/2014   Procedure: CYSTOSCOPY/HYDRODISTENSION INSTILLATION OF MARCAINE AND PYRDIUM ;  Surgeon: Alfredo Martinez, MD;  Location: [facility];  Service: Urology;  Laterality: N/A;  . CYSTO WITH HYDRODISTENSION N/A 09/17/2014   Procedure: CYSTOSCOPY HYDRODISTENSION INSTILLATION OF MARCAINE AND PYRIDIUM;  Surgeon: Alfredo Martinez, MD;  Location: [facility];  Service: Urology;  Laterality: N/A;  . CYSTO/ HOD/ INSTILLATION THERAPY  10-04-2008  &  05-02-2009  . CYSTO/ LEFT URETEROSCOPY W/ LEFT URETERAL DILATION  04-15-2006  . TOTAL HIP ARTHROPLASTY Left 05/29/2016   Procedure: TOTAL HIP ARTHROPLASTY ANTERIOR APPROACH;  Surgeon: Cammy Copa, MD;  Location: [facility];  Service: Orthopedics;  Laterality: Left;  . TRANSTHORACIC ECHOCARDIOGRAM   07-17-2013   grade I diastolic dysfuntion/  ef 60-65%/  mild AR and MR/  trivial TR  . URETEROLITHOTOMY Bilateral 11-24-2004 LEFT  &  05-29-2006  RIGHT  . VAULT SUSPENSION WITH GRAFT/ CYSTOCELE REPAIR  09-17-2009   Social History   Occupational History  . Not on file.   Social History Main Topics  . Smoking status: Never Smoker  . Smokeless tobacco: Never Used  . Alcohol use No  . Drug use: No  . Sexual activity: Not on file

## 2016-09-30 DIAGNOSIS — Z23 Encounter for immunization: Secondary | ICD-10-CM | POA: Diagnosis not present

## 2016-10-13 DIAGNOSIS — N3010 Interstitial cystitis (chronic) without hematuria: Secondary | ICD-10-CM | POA: Diagnosis not present

## 2016-10-15 ENCOUNTER — Encounter (INDEPENDENT_AMBULATORY_CARE_PROVIDER_SITE_OTHER): Payer: Medicare Other | Admitting: Physical Medicine and Rehabilitation

## 2016-10-20 ENCOUNTER — Ambulatory Visit (INDEPENDENT_AMBULATORY_CARE_PROVIDER_SITE_OTHER): Payer: Medicare Other | Admitting: Physical Medicine and Rehabilitation

## 2016-10-20 ENCOUNTER — Ambulatory Visit (INDEPENDENT_AMBULATORY_CARE_PROVIDER_SITE_OTHER): Payer: Medicare Other

## 2016-10-20 VITALS — BP 124/74 | Temp 98.3°F

## 2016-10-20 DIAGNOSIS — M48062 Spinal stenosis, lumbar region with neurogenic claudication: Secondary | ICD-10-CM | POA: Diagnosis not present

## 2016-10-20 DIAGNOSIS — M5416 Radiculopathy, lumbar region: Secondary | ICD-10-CM

## 2016-10-20 DIAGNOSIS — Z96642 Presence of left artificial hip joint: Secondary | ICD-10-CM

## 2016-10-20 DIAGNOSIS — F0390 Unspecified dementia without behavioral disturbance: Secondary | ICD-10-CM

## 2016-10-20 DIAGNOSIS — G5712 Meralgia paresthetica, left lower limb: Secondary | ICD-10-CM

## 2016-10-20 MED ORDER — BETAMETHASONE SOD PHOS & ACET 6 (3-3) MG/ML IJ SUSP
12.0000 mg | Freq: Once | INTRAMUSCULAR | Status: AC
  Administered 2016-10-20: 12 mg

## 2016-10-20 MED ORDER — LIDOCAINE HCL (PF) 1 % IJ SOLN
2.0000 mL | Freq: Once | INTRAMUSCULAR | Status: AC
  Administered 2016-10-20: 2 mL

## 2016-10-20 NOTE — Patient Instructions (Signed)

## 2016-10-20 NOTE — Progress Notes (Deleted)
Lower back pain, left side pain with radiating left leg pain. Pain worse with increased walking. Previous injection in August, did not help. Has driver. Off Xarelto since Saturday. No contrast allergy.     Takes tramadol or extra strength tylenol.

## 2016-10-21 ENCOUNTER — Encounter (INDEPENDENT_AMBULATORY_CARE_PROVIDER_SITE_OTHER): Payer: Self-pay | Admitting: Physical Medicine and Rehabilitation

## 2016-10-21 NOTE — Progress Notes (Signed)
Christine [name] - 83 y.o. female MRN [id number]  Date of birth: 1933/05/29  Office Visit Note: Visit Date: 10/20/2016 PCP: Crist Infante, MD Referred by: Crist Infante, MD  Subjective: Chief Complaint  Patient presents with  . Lower Back - Pain  . Left Leg - Pain, Numbness  . Left Hip - Pain   HPI: Christine [name] is an 83 year old female who comes in today with her daughter. Unfortunately I feel like she is having worsening dementia. We completed a prior left L4 transforaminal injection performed in August of this year without much relief. She also has MRI from 2017 which is fairly current that does not show any specific nerve compression. She has no high-grade stenosis. She has had prior left total hip arthroplasty. She continues to get some numbness in the anterior thigh from that surgery which may be more related to a lateral femoral cutaneous nerve. We had her follow-up with Dr. Marlou Sa because we realize the injection just was not very beneficial and does not seem a great deal of spine issues that would cause the symptoms. He however suggested another injection of the lumbar spine is to see if it would give her some relief. She is here for that today. Really is a poor historian about terms of her pain. Her daughter felt like there was pain relief after the last injection but was short-term. The patient feels like there was no relief. The patient says her back hurts across the back and she gets pain down the left leg but cannot give me really an ideal area of the distribution today. In the past it was more of an L4-L5 distribution. It is worse with standing and ambulating but to be fairly constant. She normally takes Xarelto is been off that since Saturday. She'll continue to take that starting tomorrow. She does take tramadol extra strength Tylenol.    Review of Systems  Constitutional: Negative for chills, fever, malaise/fatigue and weight loss.  HENT: Negative for hearing loss and sinus pain.     Eyes: Negative for blurred vision, double vision and photophobia.  Respiratory: Negative for cough and shortness of breath.   Cardiovascular: Negative for chest pain, palpitations and leg swelling.  Gastrointestinal: Negative for abdominal pain, nausea and vomiting.  Genitourinary: Negative for flank pain.  Musculoskeletal: Positive for back pain and joint pain. Negative for myalgias.  Skin: Negative for itching and rash.  Neurological: Positive for tingling. Negative for tremors, focal weakness and weakness.  Endo/Heme/Allergies: Negative.   Psychiatric/Behavioral: Negative for depression.  All other systems reviewed and are negative.  Otherwise per HPI.  Assessment & Plan: Visit Diagnoses:  1. Lumbar radiculopathy   2. Spinal stenosis of lumbar region with neurogenic claudication   3. History of total left hip arthroplasty   4. Meralgia paraesthetica, left   5. Dementia without behavioral disturbance, unspecified dementia type     Plan: Findings:  Complicated pain patient with history of left hip arthroplasty with some numbness and tingling in the anterior thigh that may be related to lateral simultaneous nerve. She has pain in the low back she also has pain in the legs predominant the left leg. She is a poor historian feel like maybe her dementia has worsened. Her daughter provides most of the history and does agree that she is having more left leg pain that does radiate down the leg. MRI from 2017 does not really reveal any specific causes. He may need to repeat the lumbar spine MRI. I do think  trying one more injection may be worthwhile just to see if we can get a good amount of medication at the L4 level once again see if she gets relief. She does not like injections in general and does ask if it's got her today. As noted below with the procedure the patient did extremely well with the injection until we enter the foramen of that she was very anxious and concerned about feeling pain in  that region and down the leg. We reassured this was normal to feel that and we were very delicate at that point but at that point she was anxious enough that she did want to stop the procedure. Were able to get Marcaine into the area and perform essentially a diagnostic nerve root block. She did report relief to a degree after the injection. She was able to ambulate on her own with no difficulties and no increased pain. Depending on her relief and where this heads I would ask for an get an updated lumbar spine MRI. She'll continue to follow with Dr. Marlou Sa for her hip.    Meds & Orders:  Meds ordered this encounter  Medications  . lidocaine (PF) (XYLOCAINE) 1 % injection 2 mL  . betamethasone acetate-betamethasone sodium phosphate (CELESTONE) injection 12 mg    Orders Placed This Encounter  Procedures  . XR C-ARM NO REPORT  . Epidural Steroid injection    Follow-up: Return if symptoms worsen or fail to improve.   Procedures: No procedures performed  Lumbosacral Transforaminal Epidural Steroid Injection - Sub-Pedicular Approach with Fluoroscopic Guidance  Patient: Christine [name]      Date of Birth: November 17, 1933 MRN: [id number] PCP: Crist Infante, MD      Visit Date: 10/20/2016   Universal Protocol:    Date/Time: 10/20/2016  Consent Given By: the patient  Position: PRONE  Additional Comments: Vital signs were monitored before and after the procedure. Patient was prepped and draped in the usual sterile fashion. The correct patient, procedure, and site was verified.   Injection Procedure Details:  Procedure Site One Meds Administered:  Meds ordered this encounter  Medications  . lidocaine (PF) (XYLOCAINE) 1 % injection 2 mL  . betamethasone acetate-betamethasone sodium phosphate (CELESTONE) injection 12 mg    Laterality: Left  Location/Site:  L4-L5  Needle size: 22 G  Needle type: Spinal  Needle Placement: Transforaminal  Findings:  -Contrast Used: 0.5 mL iohexol 180  mg iodine/mL   -Comments: Excellent flow of contrast along the nerve and into the epidural space.  Procedure Details: After squaring off the end-plates to get a true AP view, the C-arm was positioned so that an oblique view of the foramen as noted above was visualized. The target area is just inferior to the "nose of the scotty dog" or sub pedicular. The soft tissues overlying this structure were infiltrated with 2-3 ml. of 1% Lidocaine without Epinephrine.  The spinal needle was inserted toward the target using a "trajectory" view along the fluoroscope beam.  Under AP and lateral visualization, the needle was advanced so it did not puncture dura and was located close the 6 O'Clock position of the pedical in AP tracterory. Biplanar projections were used to confirm position. Aspiration was confirmed to be negative for CSF and/or blood. A 1-2 ml. volume of Isovue-250 was injected and flow of contrast was noted at each level. Radiographs were obtained for documentation purposes.   After attaining the desired flow of contrast documented above, a 0.5 to 1.0 ml test dose of  Christine [name] - 83 y.o. female MRN [id number]  Date of birth: 1933/05/29  Office Visit Note: Visit Date: 10/20/2016 PCP: Crist Infante, MD Referred by: Crist Infante, MD  Subjective: Chief Complaint  Patient presents with  . Lower Back - Pain  . Left Leg - Pain, Numbness  . Left Hip - Pain   HPI: Christine [name] is an 83 year old female who comes in today with her daughter. Unfortunately I feel like she is having worsening dementia. We completed a prior left L4 transforaminal injection performed in August of this year without much relief. She also has MRI from 2017 which is fairly current that does not show any specific nerve compression. She has no high-grade stenosis. She has had prior left total hip arthroplasty. She continues to get some numbness in the anterior thigh from that surgery which may be more related to a lateral femoral cutaneous nerve. We had her follow-up with Dr. Marlou Sa because we realize the injection just was not very beneficial and does not seem a great deal of spine issues that would cause the symptoms. He however suggested another injection of the lumbar spine is to see if it would give her some relief. She is here for that today. Really is a poor historian about terms of her pain. Her daughter felt like there was pain relief after the last injection but was short-term. The patient feels like there was no relief. The patient says her back hurts across the back and she gets pain down the left leg but cannot give me really an ideal area of the distribution today. In the past it was more of an L4-L5 distribution. It is worse with standing and ambulating but to be fairly constant. She normally takes Xarelto is been off that since Saturday. She'll continue to take that starting tomorrow. She does take tramadol extra strength Tylenol.    Review of Systems  Constitutional: Negative for chills, fever, malaise/fatigue and weight loss.  HENT: Negative for hearing loss and sinus pain.     Eyes: Negative for blurred vision, double vision and photophobia.  Respiratory: Negative for cough and shortness of breath.   Cardiovascular: Negative for chest pain, palpitations and leg swelling.  Gastrointestinal: Negative for abdominal pain, nausea and vomiting.  Genitourinary: Negative for flank pain.  Musculoskeletal: Positive for back pain and joint pain. Negative for myalgias.  Skin: Negative for itching and rash.  Neurological: Positive for tingling. Negative for tremors, focal weakness and weakness.  Endo/Heme/Allergies: Negative.   Psychiatric/Behavioral: Negative for depression.  All other systems reviewed and are negative.  Otherwise per HPI.  Assessment & Plan: Visit Diagnoses:  1. Lumbar radiculopathy   2. Spinal stenosis of lumbar region with neurogenic claudication   3. History of total left hip arthroplasty   4. Meralgia paraesthetica, left   5. Dementia without behavioral disturbance, unspecified dementia type     Plan: Findings:  Complicated pain patient with history of left hip arthroplasty with some numbness and tingling in the anterior thigh that may be related to lateral simultaneous nerve. She has pain in the low back she also has pain in the legs predominant the left leg. She is a poor historian feel like maybe her dementia has worsened. Her daughter provides most of the history and does agree that she is having more left leg pain that does radiate down the leg. MRI from 2017 does not really reveal any specific causes. He may need to repeat the lumbar spine MRI. I do think  trying one more injection may be worthwhile just to see if we can get a good amount of medication at the L4 level once again see if she gets relief. She does not like injections in general and does ask if it's got her today. As noted below with the procedure the patient did extremely well with the injection until we enter the foramen of that she was very anxious and concerned about feeling pain in  that region and down the leg. We reassured this was normal to feel that and we were very delicate at that point but at that point she was anxious enough that she did want to stop the procedure. Were able to get Marcaine into the area and perform essentially a diagnostic nerve root block. She did report relief to a degree after the injection. She was able to ambulate on her own with no difficulties and no increased pain. Depending on her relief and where this heads I would ask for an get an updated lumbar spine MRI. She'll continue to follow with Dr. Marlou Sa for her hip.    Meds & Orders:  Meds ordered this encounter  Medications  . lidocaine (PF) (XYLOCAINE) 1 % injection 2 mL  . betamethasone acetate-betamethasone sodium phosphate (CELESTONE) injection 12 mg    Orders Placed This Encounter  Procedures  . XR C-ARM NO REPORT  . Epidural Steroid injection    Follow-up: Return if symptoms worsen or fail to improve.   Procedures: No procedures performed  Lumbosacral Transforaminal Epidural Steroid Injection - Sub-Pedicular Approach with Fluoroscopic Guidance  Patient: Christine [name]      Date of Birth: November 17, 1933 MRN: [id number] PCP: Crist Infante, MD      Visit Date: 10/20/2016   Universal Protocol:    Date/Time: 10/20/2016  Consent Given By: the patient  Position: PRONE  Additional Comments: Vital signs were monitored before and after the procedure. Patient was prepped and draped in the usual sterile fashion. The correct patient, procedure, and site was verified.   Injection Procedure Details:  Procedure Site One Meds Administered:  Meds ordered this encounter  Medications  . lidocaine (PF) (XYLOCAINE) 1 % injection 2 mL  . betamethasone acetate-betamethasone sodium phosphate (CELESTONE) injection 12 mg    Laterality: Left  Location/Site:  L4-L5  Needle size: 22 G  Needle type: Spinal  Needle Placement: Transforaminal  Findings:  -Contrast Used: 0.5 mL iohexol 180  mg iodine/mL   -Comments: Excellent flow of contrast along the nerve and into the epidural space.  Procedure Details: After squaring off the end-plates to get a true AP view, the C-arm was positioned so that an oblique view of the foramen as noted above was visualized. The target area is just inferior to the "nose of the scotty dog" or sub pedicular. The soft tissues overlying this structure were infiltrated with 2-3 ml. of 1% Lidocaine without Epinephrine.  The spinal needle was inserted toward the target using a "trajectory" view along the fluoroscope beam.  Under AP and lateral visualization, the needle was advanced so it did not puncture dura and was located close the 6 O'Clock position of the pedical in AP tracterory. Biplanar projections were used to confirm position. Aspiration was confirmed to be negative for CSF and/or blood. A 1-2 ml. volume of Isovue-250 was injected and flow of contrast was noted at each level. Radiographs were obtained for documentation purposes.   After attaining the desired flow of contrast documented above, a 0.5 to 1.0 ml test dose of  trying one more injection may be worthwhile just to see if we can get a good amount of medication at the L4 level once again see if she gets relief. She does not like injections in general and does ask if it's got her today. As noted below with the procedure the patient did extremely well with the injection until we enter the foramen of that she was very anxious and concerned about feeling pain in  that region and down the leg. We reassured this was normal to feel that and we were very delicate at that point but at that point she was anxious enough that she did want to stop the procedure. Were able to get Marcaine into the area and perform essentially a diagnostic nerve root block. She did report relief to a degree after the injection. She was able to ambulate on her own with no difficulties and no increased pain. Depending on her relief and where this heads I would ask for an get an updated lumbar spine MRI. She'll continue to follow with Dr. Marlou Sa for her hip.    Meds & Orders:  Meds ordered this encounter  Medications  . lidocaine (PF) (XYLOCAINE) 1 % injection 2 mL  . betamethasone acetate-betamethasone sodium phosphate (CELESTONE) injection 12 mg    Orders Placed This Encounter  Procedures  . XR C-ARM NO REPORT  . Epidural Steroid injection    Follow-up: Return if symptoms worsen or fail to improve.   Procedures: No procedures performed  Lumbosacral Transforaminal Epidural Steroid Injection - Sub-Pedicular Approach with Fluoroscopic Guidance  Patient: Christine [name]      Date of Birth: November 17, 1933 MRN: [id number] PCP: Crist Infante, MD      Visit Date: 10/20/2016   Universal Protocol:    Date/Time: 10/20/2016  Consent Given By: the patient  Position: PRONE  Additional Comments: Vital signs were monitored before and after the procedure. Patient was prepped and draped in the usual sterile fashion. The correct patient, procedure, and site was verified.   Injection Procedure Details:  Procedure Site One Meds Administered:  Meds ordered this encounter  Medications  . lidocaine (PF) (XYLOCAINE) 1 % injection 2 mL  . betamethasone acetate-betamethasone sodium phosphate (CELESTONE) injection 12 mg    Laterality: Left  Location/Site:  L4-L5  Needle size: 22 G  Needle type: Spinal  Needle Placement: Transforaminal  Findings:  -Contrast Used: 0.5 mL iohexol 180  mg iodine/mL   -Comments: Excellent flow of contrast along the nerve and into the epidural space.  Procedure Details: After squaring off the end-plates to get a true AP view, the C-arm was positioned so that an oblique view of the foramen as noted above was visualized. The target area is just inferior to the "nose of the scotty dog" or sub pedicular. The soft tissues overlying this structure were infiltrated with 2-3 ml. of 1% Lidocaine without Epinephrine.  The spinal needle was inserted toward the target using a "trajectory" view along the fluoroscope beam.  Under AP and lateral visualization, the needle was advanced so it did not puncture dura and was located close the 6 O'Clock position of the pedical in AP tracterory. Biplanar projections were used to confirm position. Aspiration was confirmed to be negative for CSF and/or blood. A 1-2 ml. volume of Isovue-250 was injected and flow of contrast was noted at each level. Radiographs were obtained for documentation purposes.   After attaining the desired flow of contrast documented above, a 0.5 to 1.0 ml test dose of

## 2016-10-21 NOTE — Procedures (Signed)
Lumbosacral Transforaminal Epidural Steroid Injection - Sub-Pedicular Approach with Fluoroscopic Guidance  Patient: Christine [name]      Date of Birth: 07-18-1933 MRN: [id number] PCP: Crist Infante, MD      Visit Date: 10/20/2016   Universal Protocol:    Date/Time: 10/20/2016  Consent Given By: the patient  Position: PRONE  Additional Comments: Vital signs were monitored before and after the procedure. Patient was prepped and draped in the usual sterile fashion. The correct patient, procedure, and site was verified.   Injection Procedure Details:  Procedure Site One Meds Administered:  Meds ordered this encounter  Medications  . lidocaine (PF) (XYLOCAINE) 1 % injection 2 mL  . betamethasone acetate-betamethasone sodium phosphate (CELESTONE) injection 12 mg    Laterality: Left  Location/Site:  L4-L5  Needle size: 22 G  Needle type: Spinal  Needle Placement: Transforaminal  Findings:  -Contrast Used: 0.5 mL iohexol 180 mg iodine/mL   -Comments: Excellent flow of contrast along the nerve and into the epidural space.  Procedure Details: After squaring off the end-plates to get a true AP view, the C-arm was positioned so that an oblique view of the foramen as noted above was visualized. The target area is just inferior to the "nose of the scotty dog" or sub pedicular. The soft tissues overlying this structure were infiltrated with 2-3 ml. of 1% Lidocaine without Epinephrine.  The spinal needle was inserted toward the target using a "trajectory" view along the fluoroscope beam.  Under AP and lateral visualization, the needle was advanced so it did not puncture dura and was located close the 6 O'Clock position of the pedical in AP tracterory. Biplanar projections were used to confirm position. Aspiration was confirmed to be negative for CSF and/or blood. A 1-2 ml. volume of Isovue-250 was injected and flow of contrast was noted at each level. Radiographs were obtained for  documentation purposes.   After attaining the desired flow of contrast documented above, a 0.5 to 1.0 ml test dose of 0.25% Marcaine was injected into each respective transforaminal space.  The patient was observed for 90 seconds post injection.  The patient did ask to stop the procedure because she felt like she was having a lot of pain during the injectate delivery of the Marcaine.  Additional Comments:  Patient felt some routine pain when entering the foramen with the spinal needle. Even though we reassured her that that is normal she was very anxious about the fact that she felt like she had not had that sensation before. Point of fact and also known by her daughter who was in the waiting room the patient did have pain the last time we did it is very common we are getting near the nerve root. She allowed [place] to put in the numbing medication at that point really wanted [place] to stop the procedure. The patient's case is complicated by worsening dementia. Dressing: Band-Aid    Post-procedure details: Patient was observed during the procedure. Post-procedure instructions were reviewed.  Patient left the clinic in stable condition.

## 2016-11-18 ENCOUNTER — Other Ambulatory Visit: Payer: Self-pay | Admitting: Dermatology

## 2016-11-18 DIAGNOSIS — D0439 Carcinoma in situ of skin of other parts of face: Secondary | ICD-10-CM | POA: Diagnosis not present

## 2017-01-04 DIAGNOSIS — I10 Essential (primary) hypertension: Secondary | ICD-10-CM | POA: Diagnosis not present

## 2017-01-04 DIAGNOSIS — R82998 Other abnormal findings in urine: Secondary | ICD-10-CM | POA: Diagnosis not present

## 2017-01-04 DIAGNOSIS — E7849 Other hyperlipidemia: Secondary | ICD-10-CM | POA: Diagnosis not present

## 2017-01-04 DIAGNOSIS — M810 Age-related osteoporosis without current pathological fracture: Secondary | ICD-10-CM | POA: Diagnosis not present

## 2017-01-11 DIAGNOSIS — Z6821 Body mass index (BMI) 21.0-21.9, adult: Secondary | ICD-10-CM | POA: Diagnosis not present

## 2017-01-11 DIAGNOSIS — L308 Other specified dermatitis: Secondary | ICD-10-CM | POA: Diagnosis not present

## 2017-01-11 DIAGNOSIS — M810 Age-related osteoporosis without current pathological fracture: Secondary | ICD-10-CM | POA: Diagnosis not present

## 2017-01-11 DIAGNOSIS — I480 Paroxysmal atrial fibrillation: Secondary | ICD-10-CM | POA: Diagnosis not present

## 2017-01-11 DIAGNOSIS — Z1389 Encounter for screening for other disorder: Secondary | ICD-10-CM | POA: Diagnosis not present

## 2017-01-11 DIAGNOSIS — R300 Dysuria: Secondary | ICD-10-CM | POA: Diagnosis not present

## 2017-01-11 DIAGNOSIS — G458 Other transient cerebral ischemic attacks and related syndromes: Secondary | ICD-10-CM | POA: Diagnosis not present

## 2017-01-11 DIAGNOSIS — H6123 Impacted cerumen, bilateral: Secondary | ICD-10-CM | POA: Diagnosis not present

## 2017-01-11 DIAGNOSIS — M5416 Radiculopathy, lumbar region: Secondary | ICD-10-CM | POA: Diagnosis not present

## 2017-01-11 DIAGNOSIS — M6282 Rhabdomyolysis: Secondary | ICD-10-CM | POA: Diagnosis not present

## 2017-01-11 DIAGNOSIS — Z0000 Encounter for general adult medical examination without abnormal findings: Secondary | ICD-10-CM | POA: Diagnosis not present

## 2017-01-11 DIAGNOSIS — E7849 Other hyperlipidemia: Secondary | ICD-10-CM | POA: Diagnosis not present

## 2017-04-13 DIAGNOSIS — N3010 Interstitial cystitis (chronic) without hematuria: Secondary | ICD-10-CM | POA: Diagnosis not present

## 2017-05-28 DIAGNOSIS — L821 Other seborrheic keratosis: Secondary | ICD-10-CM | POA: Diagnosis not present

## 2017-05-28 DIAGNOSIS — L819 Disorder of pigmentation, unspecified: Secondary | ICD-10-CM | POA: Diagnosis not present

## 2017-05-28 DIAGNOSIS — D485 Neoplasm of uncertain behavior of skin: Secondary | ICD-10-CM | POA: Diagnosis not present

## 2017-05-28 DIAGNOSIS — L820 Inflamed seborrheic keratosis: Secondary | ICD-10-CM | POA: Diagnosis not present

## 2017-05-28 DIAGNOSIS — D225 Melanocytic nevi of trunk: Secondary | ICD-10-CM | POA: Diagnosis not present

## 2017-05-28 DIAGNOSIS — C44729 Squamous cell carcinoma of skin of left lower limb, including hip: Secondary | ICD-10-CM | POA: Diagnosis not present

## 2017-05-28 DIAGNOSIS — L570 Actinic keratosis: Secondary | ICD-10-CM | POA: Diagnosis not present

## 2017-05-28 DIAGNOSIS — D1801 Hemangioma of skin and subcutaneous tissue: Secondary | ICD-10-CM | POA: Diagnosis not present

## 2017-07-26 DIAGNOSIS — I480 Paroxysmal atrial fibrillation: Secondary | ICD-10-CM | POA: Diagnosis not present

## 2017-07-26 DIAGNOSIS — G458 Other transient cerebral ischemic attacks and related syndromes: Secondary | ICD-10-CM | POA: Diagnosis not present

## 2017-07-26 DIAGNOSIS — F3289 Other specified depressive episodes: Secondary | ICD-10-CM | POA: Diagnosis not present

## 2017-07-26 DIAGNOSIS — E7849 Other hyperlipidemia: Secondary | ICD-10-CM | POA: Diagnosis not present

## 2017-07-26 DIAGNOSIS — I10 Essential (primary) hypertension: Secondary | ICD-10-CM | POA: Diagnosis not present

## 2017-07-26 DIAGNOSIS — N3010 Interstitial cystitis (chronic) without hematuria: Secondary | ICD-10-CM | POA: Diagnosis not present

## 2017-07-26 DIAGNOSIS — Z6824 Body mass index (BMI) 24.0-24.9, adult: Secondary | ICD-10-CM | POA: Diagnosis not present

## 2017-07-26 DIAGNOSIS — M810 Age-related osteoporosis without current pathological fracture: Secondary | ICD-10-CM | POA: Diagnosis not present

## 2017-08-18 DIAGNOSIS — B078 Other viral warts: Secondary | ICD-10-CM | POA: Diagnosis not present

## 2017-08-18 DIAGNOSIS — L570 Actinic keratosis: Secondary | ICD-10-CM | POA: Diagnosis not present

## 2017-08-18 DIAGNOSIS — L821 Other seborrheic keratosis: Secondary | ICD-10-CM | POA: Diagnosis not present

## 2017-10-14 DIAGNOSIS — R351 Nocturia: Secondary | ICD-10-CM | POA: Diagnosis not present

## 2017-10-14 DIAGNOSIS — R350 Frequency of micturition: Secondary | ICD-10-CM | POA: Insufficient documentation

## 2017-10-14 DIAGNOSIS — R102 Pelvic and perineal pain: Secondary | ICD-10-CM | POA: Diagnosis not present

## 2017-10-14 DIAGNOSIS — Z09 Encounter for follow-up examination after completed treatment for conditions other than malignant neoplasm: Secondary | ICD-10-CM | POA: Diagnosis not present

## 2017-10-14 DIAGNOSIS — N3010 Interstitial cystitis (chronic) without hematuria: Secondary | ICD-10-CM | POA: Diagnosis not present

## 2018-02-09 DIAGNOSIS — I10 Essential (primary) hypertension: Secondary | ICD-10-CM | POA: Diagnosis not present

## 2018-02-09 DIAGNOSIS — E559 Vitamin D deficiency, unspecified: Secondary | ICD-10-CM | POA: Diagnosis not present

## 2018-02-09 DIAGNOSIS — R82998 Other abnormal findings in urine: Secondary | ICD-10-CM | POA: Diagnosis not present

## 2018-02-09 DIAGNOSIS — E7849 Other hyperlipidemia: Secondary | ICD-10-CM | POA: Diagnosis not present

## 2018-02-14 DIAGNOSIS — G458 Other transient cerebral ischemic attacks and related syndromes: Secondary | ICD-10-CM | POA: Diagnosis not present

## 2018-02-14 DIAGNOSIS — Z6826 Body mass index (BMI) 26.0-26.9, adult: Secondary | ICD-10-CM | POA: Diagnosis not present

## 2018-02-14 DIAGNOSIS — Z1331 Encounter for screening for depression: Secondary | ICD-10-CM | POA: Diagnosis not present

## 2018-02-14 DIAGNOSIS — Z0000 Encounter for general adult medical examination without abnormal findings: Secondary | ICD-10-CM | POA: Diagnosis not present

## 2018-02-14 DIAGNOSIS — R300 Dysuria: Secondary | ICD-10-CM | POA: Diagnosis not present

## 2018-02-14 DIAGNOSIS — M5416 Radiculopathy, lumbar region: Secondary | ICD-10-CM | POA: Diagnosis not present

## 2018-02-14 DIAGNOSIS — G894 Chronic pain syndrome: Secondary | ICD-10-CM | POA: Diagnosis not present

## 2018-02-14 DIAGNOSIS — M810 Age-related osteoporosis without current pathological fracture: Secondary | ICD-10-CM | POA: Diagnosis not present

## 2018-02-14 DIAGNOSIS — Z23 Encounter for immunization: Secondary | ICD-10-CM | POA: Diagnosis not present

## 2018-02-14 DIAGNOSIS — I480 Paroxysmal atrial fibrillation: Secondary | ICD-10-CM | POA: Diagnosis not present

## 2018-02-14 DIAGNOSIS — R413 Other amnesia: Secondary | ICD-10-CM | POA: Diagnosis not present

## 2018-02-14 DIAGNOSIS — H6123 Impacted cerumen, bilateral: Secondary | ICD-10-CM | POA: Diagnosis not present

## 2018-02-14 DIAGNOSIS — N3010 Interstitial cystitis (chronic) without hematuria: Secondary | ICD-10-CM | POA: Diagnosis not present

## 2018-02-17 ENCOUNTER — Other Ambulatory Visit: Payer: Self-pay

## 2018-02-17 ENCOUNTER — Ambulatory Visit: Payer: PPO | Admitting: Podiatry

## 2018-02-17 ENCOUNTER — Encounter: Payer: Self-pay | Admitting: Podiatry

## 2018-02-17 VITALS — BP 136/64 | HR 85

## 2018-02-17 DIAGNOSIS — M79675 Pain in left toe(s): Secondary | ICD-10-CM | POA: Diagnosis not present

## 2018-02-17 DIAGNOSIS — Z9229 Personal history of other drug therapy: Secondary | ICD-10-CM

## 2018-02-17 DIAGNOSIS — B351 Tinea unguium: Secondary | ICD-10-CM

## 2018-02-17 DIAGNOSIS — M79674 Pain in right toe(s): Secondary | ICD-10-CM | POA: Diagnosis not present

## 2018-02-17 NOTE — Patient Instructions (Signed)

## 2018-02-21 NOTE — Progress Notes (Signed)
Subjective: Christine [name] presents today accompanied by her daughter and referred by Christine Infante, MD with cc of painful, discolored, thick toenails which interfere with daily activities. Duration is longstanding.  Pain is aggravated when wearing enclosed shoe gear.   Christine [name] is also on the blood thinner Xarelto.  Past Medical History:  Diagnosis Date  . AR (allergic rhinitis)   . Arthritis   . Bilateral carotid artery stenosis    MILD --  40% BILATERAL ICA PER DOPPLER JULY 2014  . Diverticulosis   . Frequency of urination   . GERD (gastroesophageal reflux disease)   . History of colon polyps    ADENOMATOUS  . History of Helicobacter pylori infection   . History of shingles   . History of TIA (transient ischemic attack)   . Hyperlipidemia   . Hypertension   . Impaired memory   . Interstitial cystitis   . Iron deficiency   . Nocturia   . Osteopenia   . PAF (paroxysmal atrial fibrillation) ([place])    dx 07/ 2015  . Pelvic pain in female   . Urgency of urination   . Vitamin D deficiency   . Wears glasses      Patient Active Problem List   Diagnosis Date Noted  . Increased frequency of urination 10/14/2017  . Hip fracture ([place]) 05/29/2016  . Closed left hip fracture ([place]) 05/27/2016  . Insomnia 12/17/2015  . Memory loss 12/17/2015  . Acute kidney injury ([place]) 10/01/2015  . Acute encephalopathy 10/01/2015  . Interstitial cystitis 10/01/2015  . Essential hypertension 10/01/2015  . Rhabdomyolysis 09/30/2015  . UTI (lower urinary tract infection) 07/16/2013  . PAF (paroxysmal atrial fibrillation) ([place]) 07/16/2013  . Chest pain 07/16/2013  . ABDOMINAL PAIN, UNSPECIFIED SITE 02/03/2010  . DYSPEPSIA 01/18/2008  . ADENOMATOUS COLONIC POLYP 01/17/2008  . HEMORRHOIDS, INTERNAL 01/17/2008  . ESOPHAGITIS 01/17/2008  . DIVERTICULOSIS, COLON 01/17/2008  . HEMORRHOIDS-EXTERNAL 12/07/2007  . GERD 12/07/2007  . Constipation 12/07/2007  . RECTAL BLEEDING 12/07/2007  . Loss of  weight 12/07/2007  . Nausea alone 12/07/2007  . HEARTBURN 12/07/2007  . ABDOMINAL BLOATING 12/07/2007  . PERSONAL HX COLONIC POLYPS 12/07/2007     Past Surgical History:  Procedure Laterality Date  . ABDOMINAL HYSTERECTOMY    . CATARACT EXTRACTION W/ INTRAOCULAR LENS  IMPLANT, BILATERAL  2009  . CYSTO WITH HYDRODISTENSION  02/24/2011   Procedure: CYSTOSCOPY/HYDRODISTENSION;  Surgeon: Reece Packer, MD;  Location: [facility];  Service: Urology;  Laterality: N/A;  INSTILLATION OF marcaine and  pyridium  . CYSTO WITH HYDRODISTENSION N/A 06/20/2013   Procedure: CYSTOSCOPY/HYDRODISTENSION/INSTALLATION OF PYRIDIUM -MARCAINE 0.5%.;  Surgeon: Reece Packer, MD;  Location: [place];  Service: Urology;  Laterality: N/A;  . CYSTO WITH HYDRODISTENSION N/A 03/22/2014   Procedure: CYSTOSCOPY/HYDRODISTENSION INSTILLATION OF MARCAINE AND PYRDIUM ;  Surgeon: Bjorn Loser, MD;  Location: [place];  Service: Urology;  Laterality: N/A;  . CYSTO WITH HYDRODISTENSION N/A 09/17/2014   Procedure: CYSTOSCOPY HYDRODISTENSION INSTILLATION OF MARCAINE AND PYRIDIUM;  Surgeon: Bjorn Loser, MD;  Location: [place];  Service: Urology;  Laterality: N/A;  . CYSTO/ HOD/ INSTILLATION THERAPY  10-04-2008  &  05-02-2009  . CYSTO/ LEFT URETEROSCOPY W/ LEFT URETERAL DILATION  04-15-2006  . TOTAL HIP ARTHROPLASTY Left 05/29/2016   Procedure: TOTAL HIP ARTHROPLASTY ANTERIOR APPROACH;  Surgeon: Meredith Pel, MD;  Location: [place];  Service: Orthopedics;  Laterality: Left;  . TRANSTHORACIC ECHOCARDIOGRAM  07-17-2013   grade  I diastolic dysfuntion/  ef 16-10%/  mild AR and MR/  trivial TR  . URETEROLITHOTOMY Bilateral 11-24-2004 LEFT  &  05-29-2006  RIGHT  . VAULT SUSPENSION WITH GRAFT/ CYSTOCELE REPAIR  09-17-2009      Current Outpatient Medications:  .  alendronate (FOSAMAX) 70 MG tablet, Take 1 tablet by mouth every 7 (seven) days., Disp:  , Rfl:  .  amoxicillin (AMOXIL) 500 MG capsule, Take 4 capsules 1 hour prior to dental cleaning/procedure., Disp: 16 capsule, Rfl: 2 .  cefdinir (OMNICEF) 300 MG capsule, , Disp: , Rfl:  .  Cholecalciferol (VITAMIN D3) 1000 UNITS CAPS, Take 1,000 Units by mouth daily. , Disp: , Rfl:  .  donepezil (ARICEPT) 5 MG tablet, Take 5 mg by mouth at bedtime., Disp: , Rfl:  .  hydrOXYzine (ATARAX/VISTARIL) 25 MG tablet, Take 50 mg by mouth at bedtime. , Disp: , Rfl: 11 .  LORazepam (ATIVAN) 1 MG tablet, Take 1-2 mg by mouth at bedtime., Disp: , Rfl:  .  losartan (COZAAR) 50 MG tablet, Take 50 mg by mouth every morning. , Disp: , Rfl:  .  Methen-Hyosc-Meth Blue-Na Phos (ME/NAPHOS/MB/HYO1) 81.6 MG TABS, TAKE 1 TABLET BY MOUTH EVERY 6 HOURS AS NEEDED FOR BURNING, Disp: , Rfl:  .  mirabegron ER (MYRBETRIQ) 50 MG TB24 tablet, Take 50 mg by mouth daily., Disp: , Rfl:  .  pantoprazole (PROTONIX) 40 MG tablet, Take 40 mg by mouth every morning. , Disp: , Rfl:  .  pentosan polysulfate (ELMIRON) 100 MG capsule, Take 200 mg by mouth 2 (two) times daily., Disp: , Rfl:  .  PRALUENT 150 MG/ML SOAJ, INJECT 1 ML SUBCUTANEOUSLY ONCE EVERY 2 WEEKS, Disp: , Rfl:  .  Rivaroxaban (XARELTO) 15 MG TABS tablet, Take 1 tablet (15 mg total) by mouth daily with supper., Disp: 30 tablet, Rfl: 0 .  traMADol (ULTRAM) 50 MG tablet, , Disp: , Rfl:  .  vitamin B-12 (CYANOCOBALAMIN) 1000 MCG tablet, Take 1,000 mcg by mouth daily., Disp: , Rfl:    Allergies  Allergen Reactions  . Gabapentin Other (See Comments)    UNKNOWN     Social History   Occupational History  . Not on file  Tobacco Use  . Smoking status: Never Smoker  . Smokeless tobacco: Never Used  Substance and Sexual Activity  . Alcohol use: No  . Drug use: No  . Sexual activity: Not on file     History reviewed. No pertinent family history.   Immunization History  Administered Date(s) Administered  . Influenza-Unspecified 10/05/2012  . PPD Test 06/02/2016  .  Tdap 07/14/2011  . Zoster Recombinat (Shingrix) 04/02/2017, 10/01/2017     Review of systems: Positive Findings in bold print.  Constitutional:  chills, fatigue, fever, sweats, weight change Communication: Optometrist, sign Ecologist, hand writing, iPad/Android device Head: headaches, head injury Eyes: changes in vision, eye pain, glaucoma, cataracts, macular degeneration, diplopia, glare,  light sensitivity, eyeglasses or contacts, blindness Ears nose mouth throat: Hard of hearing, ringing in ears, deaf, sign language,  vertigo,   nosebleeds,  rhinitis,  cold sores, snoring, swollen glands Cardiovascular: HTN, edema, arrhythmia, pacemaker in place, defibrillator in place,  chest pain/tightness, chronic anticoagulation, blood clot, heart failure Peripheral Vascular: leg cramps, varicose veins, blood clots, lymphedema Respiratory:  difficulty breathing, denies congestion, SOB, wheezing, cough, emphysema Gastrointestinal: change in appetite or weight, abdominal pain, constipation, diarrhea, nausea, vomiting, vomiting blood, change in bowel habits, abdominal pain, jaundice, rectal bleeding, hemorrhoids, GERD Genitourinary:  nocturia,  pain on urination,  blood in urine, Foley catheter, urinary urgency Musculoskeletal: uses mobility aid,  cramping, stiff joints, painful joints, decreased joint motion, fractures, OA, gout Skin: +changes in toenails, color change, dryness, itching, mole changes,  rash  Neurological: headaches, numbness in feet, paresthesias in feet, burning in feet, fainting,  seizures, change in speech. denies headaches, memory problems/poor historian, cerebral palsy, weakness, paralysis Endocrine: diabetes, hypothyroidism, hyperthyroidism,  goiter, dry mouth, flushing, heat intolerance,  cold intolerance,  excessive thirst, denies polyuria,  nocturia Hematological:  easy bleeding, excessive bleeding, easy bruising, enlarged lymph nodes, on long term blood thinner, history of  past transusions Allergy/immunological:  hives, eczema, frequent infections, multiple drug allergies, seasonal allergies, transplant recipient Psychiatric:  anxiety, depression, mood disorder, suicidal ideations, hallucinations   Objective: Vascular Examination: Capillary refill time immediate x 10 digits  Dorsalis pedis and posterior tibial pulses present b/l  No digital hair x 10 digits  Skin temperature gradient WNL b/l  Dermatological Examination: Skin with normal turgor, texture and tone b/l  Toenails 1-5 b/l discolored, thick, dystrophic with subungual debris and pain with palpation to nailbeds due to thickness of nails.  Musculoskeletal: Muscle strength 5/5 to all LE muscle groups  HAV with bunion deformity b/l  Juxtamalleolar lipomas b/l ankles  Neurological: Sensation intact with 10 gram monofilament  Vibratory sensation intact.  Assessment: 1. Painful onychomycosis toenails 1-5 b/l  2. Patient on long term blood thinner, Xarelto   Plan: 1. Discussed onychomycosis and treatment options.  Literature dispensed on today. 2. Toenails 1-5 b/l were debrided in length and girth without iatrogenic bleeding. 3. Patient to continue soft, supportive shoe gear 4. Patient to report any pedal injuries to medical professional immediately. 5. Follow up 3 months. Patient/POA to call should there be a concern in the interim.

## 2018-06-06 ENCOUNTER — Ambulatory Visit: Payer: PPO | Admitting: Podiatry

## 2018-07-15 IMAGING — MR MR LUMBAR SPINE W/O CM
5 series · 43 of 48 positions shown · non-contrast
Comparison: 02/18/2010

CLINICAL DATA: Low back pain with bilateral leg pain for 2-3 years.

EXAM:
MRI LUMBAR SPINE WITHOUT CONTRAST
TECHNIQUE: Multiplanar, multisequence MR imaging of the lumbar spine was
performed. No intravenous contrast was administered.

[Series 3: tirm sag · sagittal · 4.0mm · 0.55mm/px · 6 of 13 slices shown]
[im 1/13]
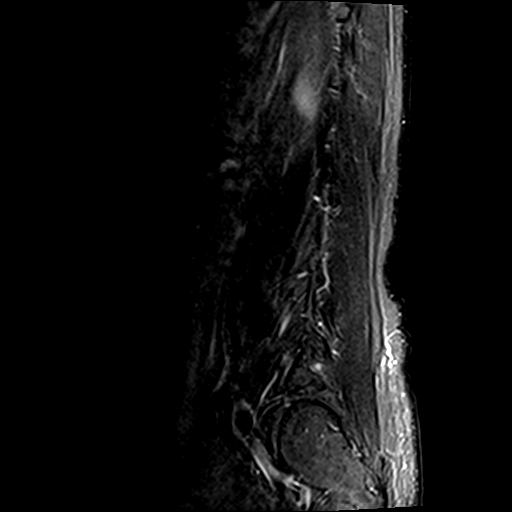
[im 3/13]
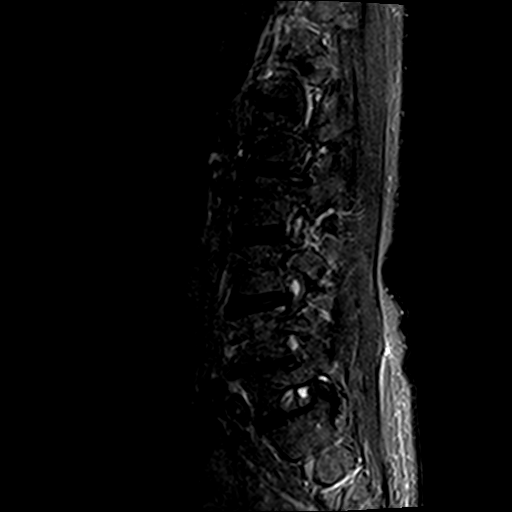
[im 5/13]
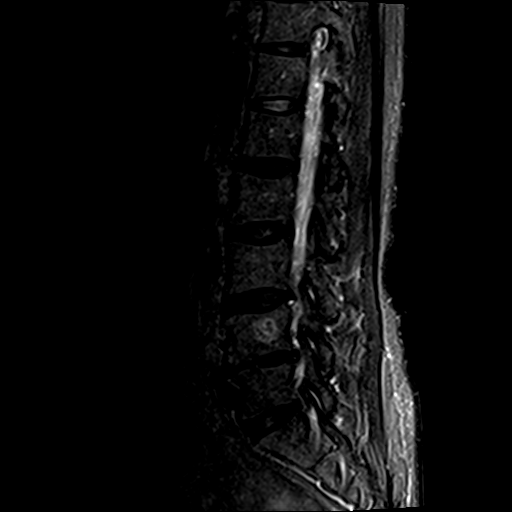
[im 8/13]
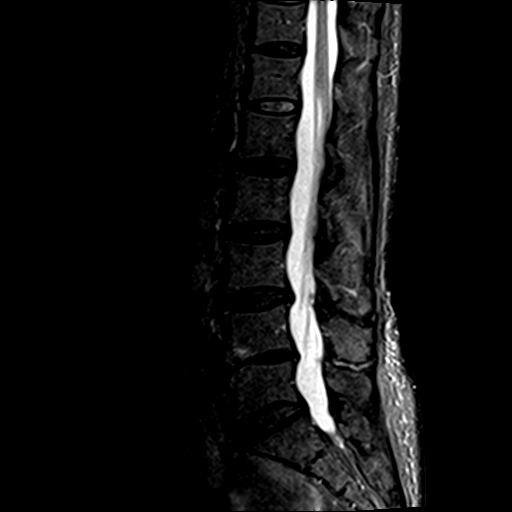
[im 10/13]
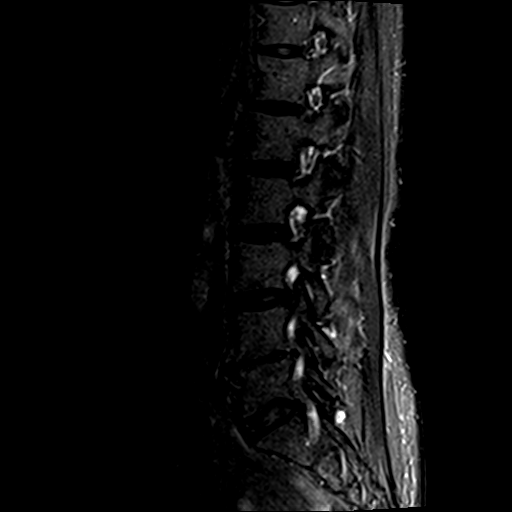
[im 13/13]
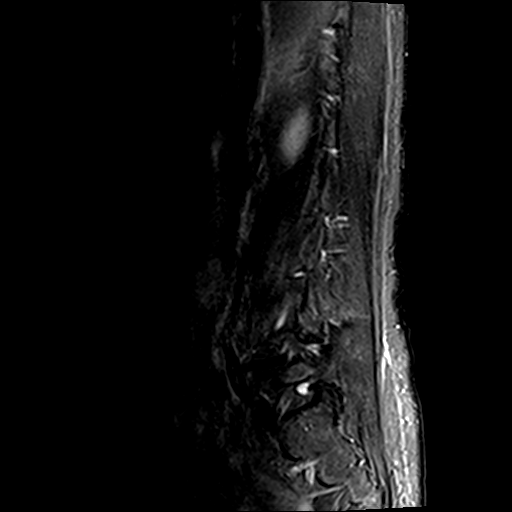

[Series 4: T2 · sagittal · 4.0mm · 0.88mm/px · 6 of 13 slices shown (1 of 2)]
[im 1/13]
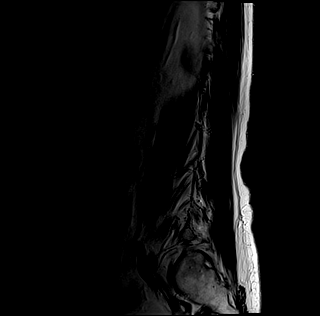
[im 3/13]
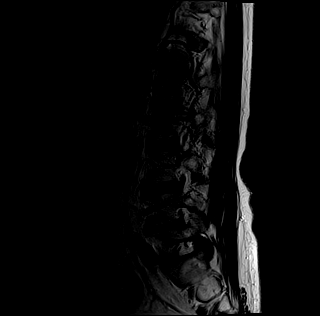
[im 5/13]
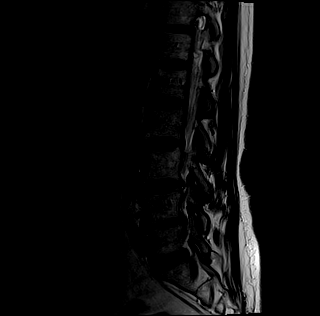
[im 8/13]
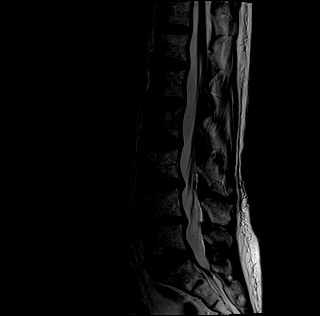
[im 10/13]
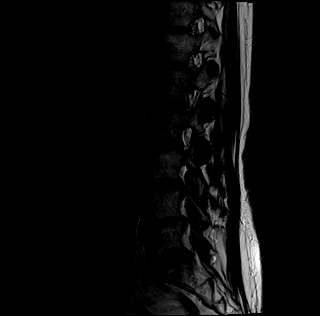
[im 13/13]
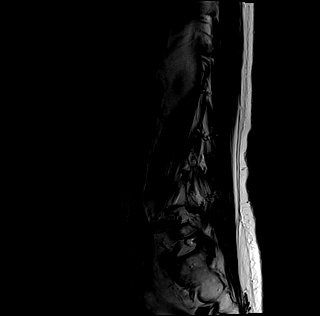

[Series 5: T1 · sagittal · 4.0mm · 0.88mm/px · 6 of 13 slices shown (1 of 2)]
[im 1/13]
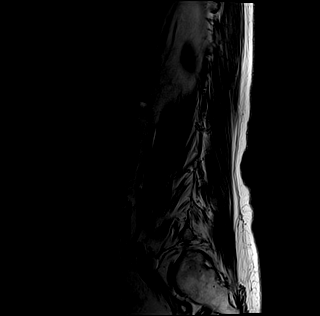
[im 3/13]
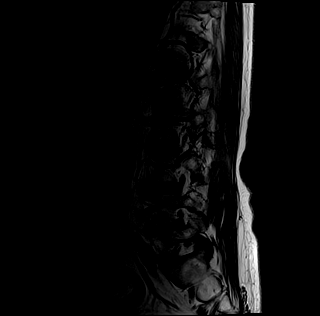
[im 5/13]
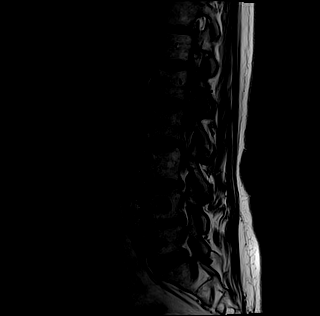
[im 8/13]
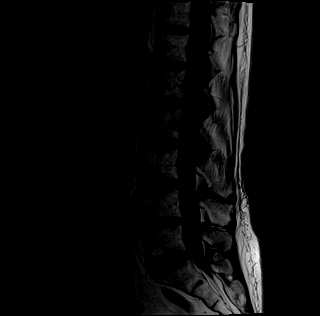
[im 10/13]
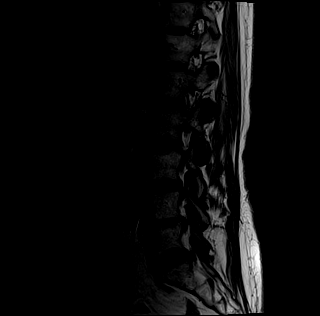
[im 13/13]
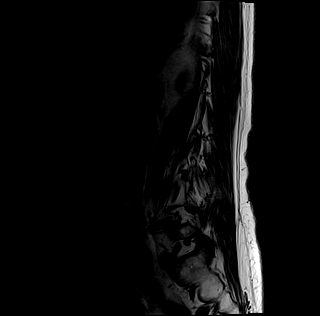

[Series 6: T1 · axial · 4.0mm · 0.70mm/px · z∈[-78,+98]mm · 10 of 32 slices shown (2 of 2)]
[im 1/32]
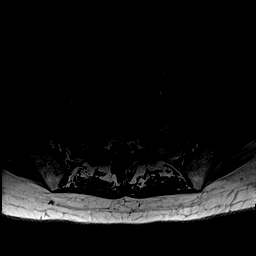
[im 3/32]
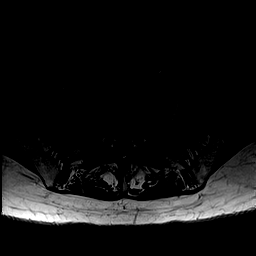
[im 5/32]
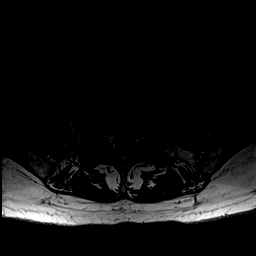
[im 9/32]
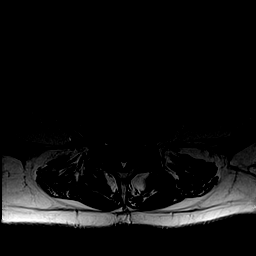
[im 14/32]
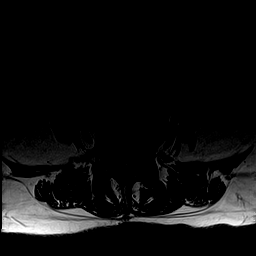
[im 16/32]
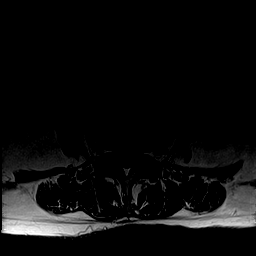
[im 18/32]
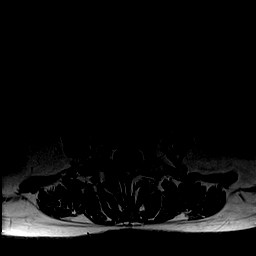
[im 23/32]
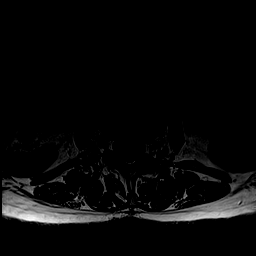
[im 27/32]
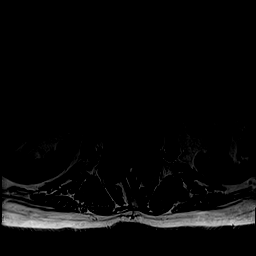
[im 32/32]
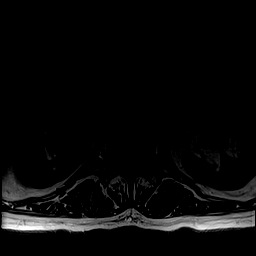

[Series 7: T2 · axial · 4.0mm · 0.70mm/px · z∈[-78,+98]mm · 15 of 32 slices shown (2 of 2)]
[im 1/32]
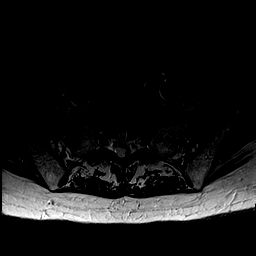
[im 3/32]
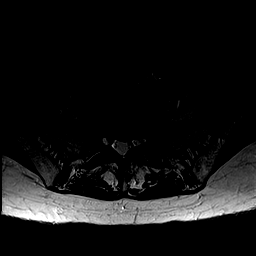
[im 5/32]
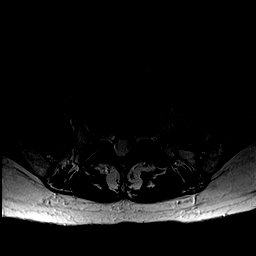
[im 7/32]
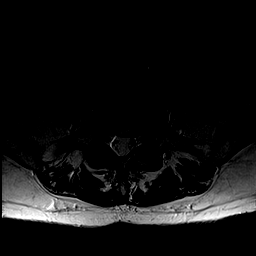
[im 9/32]
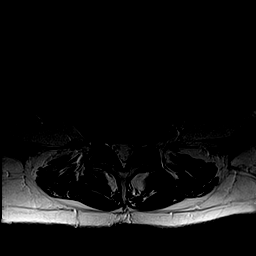
[im 12/32]
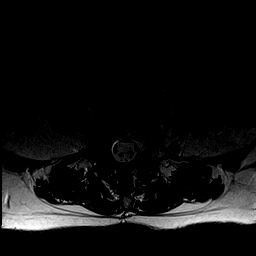
[im 14/32]
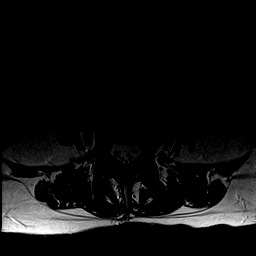
[im 16/32]
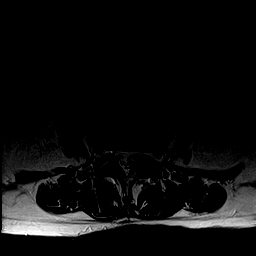
[im 18/32]
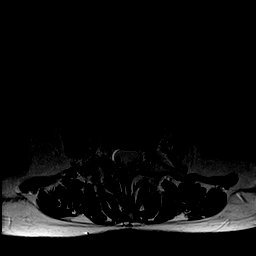
[im 20/32]
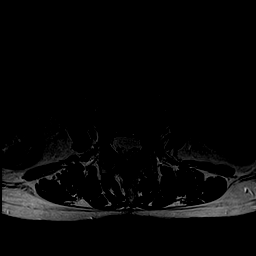
[im 23/32]
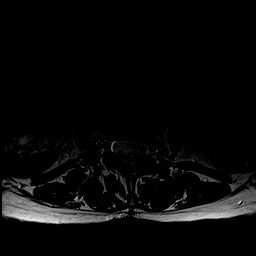
[im 25/32]
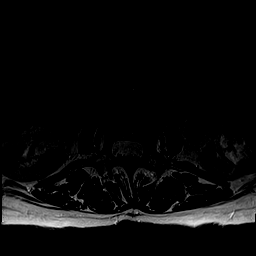
[im 27/32]
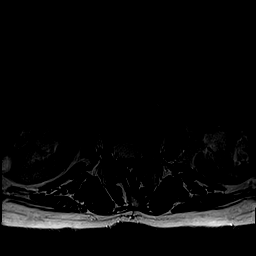
[im 29/32]
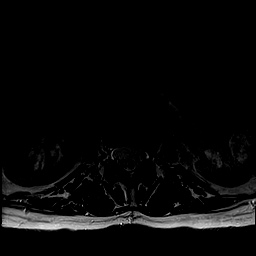
[im 32/32]
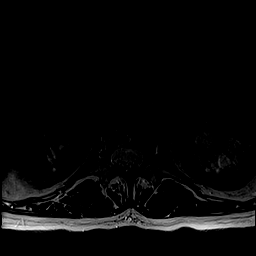

[43 of 48 positions shown; findings below may reference images not displayed]

FINDINGS: Segmentation: The lowest lumbar type non-rib-bearing vertebra is
labeled as L5.

Alignment: Straightening of the normal lumbar lordosis but no
subluxation.

Vertebrae: Suspected mildly atypical hemangioma eccentric to the
left in the L4 vertebral body, corresponding lesion visible on 6236
CT. Primarily type 2 degenerative endplate findings at L4-5 with
loss of intervertebral disc height. Disc desiccation throughout the
lumbar spine.

Conus medullaris: Extends to the L1 level and appears normal.

Paraspinal and other soft tissues: Fluid signal intensity parapelvic
and right renal parenchymal lesions, likely cysts.

Disc levels:

L1-2:  No impingement.  Central annular tear.

L2-3: No impingement. Mild disc bulge. Mild left facet arthropathy.

L3-4: Mild bilateral subarticular lateral recess stenosis and
borderline central narrowing of the thecal sac due to disc bulge,
left paracentral annular tear, and facet arthropathy.

L4-5: Mild right and borderline left foraminal stenosis with mild
bilateral subarticular lateral recess stenosis due to disc bulge,
intervertebral spurring, and facet spurring.

L5-S1: Mild left and borderline right subarticular lateral recess
stenosis and borderline bilateral foraminal stenosis due to disc
bulge, facet arthropathy, and small bilateral foraminal synovial
cysts.
IMPRESSION: 1. Lumbar spondylosis and degenerative disc disease, resulting in
mild impingement at L3-4, L4-5, and L5-S 1, as detailed above. At
the L5-S1 level, small bilateral foraminal synovial cyst contribute
to the borderline bilateral foraminal stenosis.

## 2018-07-20 ENCOUNTER — Other Ambulatory Visit: Payer: Self-pay

## 2018-07-20 ENCOUNTER — Encounter: Payer: Self-pay | Admitting: Podiatry

## 2018-07-20 ENCOUNTER — Ambulatory Visit (INDEPENDENT_AMBULATORY_CARE_PROVIDER_SITE_OTHER): Payer: PPO | Admitting: Podiatry

## 2018-07-20 VITALS — Temp 97.9°F

## 2018-07-20 DIAGNOSIS — Z9229 Personal history of other drug therapy: Secondary | ICD-10-CM

## 2018-07-20 DIAGNOSIS — B351 Tinea unguium: Secondary | ICD-10-CM | POA: Diagnosis not present

## 2018-07-20 DIAGNOSIS — M79674 Pain in right toe(s): Secondary | ICD-10-CM

## 2018-07-20 DIAGNOSIS — M79675 Pain in left toe(s): Secondary | ICD-10-CM | POA: Diagnosis not present

## 2018-07-20 NOTE — Progress Notes (Signed)
Subjective: Christine [name] is a 85 y.o. y.o. female who is on long term blood thinner Xarelto presents today with painful, discolored, thick toenails  which interfere with daily activities. Pain is aggravated when wearing enclosed shoe gear. Pain is relieved with periodic professional debridement.  Patient's PCP is Crist Infante, MD and last date seen was 02/14/2018.   Current Outpatient Medications:  .  alendronate (FOSAMAX) 70 MG tablet, Take 1 tablet by mouth every 7 (seven) days., Disp: , Rfl:  .  amoxicillin (AMOXIL) 500 MG capsule, Take 4 capsules 1 hour prior to dental cleaning/procedure., Disp: 16 capsule, Rfl: 2 .  cefdinir (OMNICEF) 300 MG capsule, , Disp: , Rfl:  .  Cholecalciferol (VITAMIN D3) 1000 UNITS CAPS, Take 1,000 Units by mouth daily. , Disp: , Rfl:  .  donepezil (ARICEPT) 5 MG tablet, Take 5 mg by mouth at bedtime., Disp: , Rfl:  .  hydrOXYzine (ATARAX/VISTARIL) 25 MG tablet, Take 50 mg by mouth at bedtime. , Disp: , Rfl: 11 .  LORazepam (ATIVAN) 1 MG tablet, Take 1-2 mg by mouth at bedtime., Disp: , Rfl:  .  losartan (COZAAR) 50 MG tablet, Take 50 mg by mouth every morning. , Disp: , Rfl:  .  Methen-Hyosc-Meth Blue-Na Phos (ME/NAPHOS/MB/HYO1) 81.6 MG TABS, TAKE 1 TABLET BY MOUTH EVERY 6 HOURS AS NEEDED FOR BURNING, Disp: , Rfl:  .  mirabegron ER (MYRBETRIQ) 50 MG TB24 tablet, Take 50 mg by mouth daily., Disp: , Rfl:  .  pantoprazole (PROTONIX) 40 MG tablet, Take 40 mg by mouth every morning. , Disp: , Rfl:  .  pentosan polysulfate (ELMIRON) 100 MG capsule, Take 200 mg by mouth 2 (two) times daily., Disp: , Rfl:  .  PRALUENT 150 MG/ML SOAJ, INJECT 1 ML SUBCUTANEOUSLY ONCE EVERY 2 WEEKS, Disp: , Rfl:  .  Rivaroxaban (XARELTO) 15 MG TABS tablet, Take 1 tablet (15 mg total) by mouth daily with supper., Disp: 30 tablet, Rfl: 0 .  traMADol (ULTRAM) 50 MG tablet, , Disp: , Rfl:  .  vitamin B-12 (CYANOCOBALAMIN) 1000 MCG tablet, Take 1,000 mcg by mouth daily., Disp: , Rfl:     Allergies  Allergen Reactions  . Gabapentin Other (See Comments)    UNKNOWN     Objective: Vitals:   07/20/18 1121  Temp: 97.9 F (36.6 C)    Vascular Examination: Capillary refill time.immediate x 10 digits.  Dorsalis pedis pulses present b/l.  Posterior tibial pulses present b/l.  No digital hair x 10 digits.  Skin temperature gradient WNL b/l.  Dermatological Examination: Skin with normal turgor, texture and tone b/l.  Toenails 1-5 b/l discolored, thick, dystrophic with subungual debris and pain with palpation to nailbeds due to thickness of nails. Area of pressure noted distal medial tip of left 2nd digit.  Musculoskeletal: Muscle strength 5/5 to all LE muscle groups.  HAV with bunion deformity b/l.  Juxtamalleolar lipomas b/l ankles with no changes.  Neurological: Sensation intact 5/5 b/l with 10 gram monofilament.  Vibratory sensation intact.  Assessment: Painful onychomycosis toenails 1-5 b/l in patient on blood thinner.   Plan: 1. Toenails 1-5 b/l were debrided in length and girth without iatrogenic bleeding. 2. Patient to continue soft, supportive shoe gear daily. 3. Patient to report any pedal injuries to medical professional immediately. 4. Avoid self trimming due to use of blood thinner. 5. Follow up 3 months.  6. Patient/POA to call should there be a concern in the interim.

## 2018-07-20 NOTE — Patient Instructions (Signed)

## 2018-08-22 DIAGNOSIS — E7849 Other hyperlipidemia: Secondary | ICD-10-CM | POA: Diagnosis not present

## 2018-08-22 DIAGNOSIS — I10 Essential (primary) hypertension: Secondary | ICD-10-CM | POA: Diagnosis not present

## 2018-08-22 DIAGNOSIS — I480 Paroxysmal atrial fibrillation: Secondary | ICD-10-CM | POA: Diagnosis not present

## 2018-08-22 DIAGNOSIS — G8929 Other chronic pain: Secondary | ICD-10-CM | POA: Diagnosis not present

## 2018-08-22 DIAGNOSIS — M810 Age-related osteoporosis without current pathological fracture: Secondary | ICD-10-CM | POA: Diagnosis not present

## 2018-08-22 DIAGNOSIS — F329 Major depressive disorder, single episode, unspecified: Secondary | ICD-10-CM | POA: Diagnosis not present

## 2018-08-22 DIAGNOSIS — Z23 Encounter for immunization: Secondary | ICD-10-CM | POA: Diagnosis not present

## 2018-08-22 DIAGNOSIS — G459 Transient cerebral ischemic attack, unspecified: Secondary | ICD-10-CM | POA: Diagnosis not present

## 2018-11-01 DIAGNOSIS — N3010 Interstitial cystitis (chronic) without hematuria: Secondary | ICD-10-CM | POA: Diagnosis not present

## 2018-11-23 ENCOUNTER — Ambulatory Visit: Payer: PPO | Admitting: Podiatry

## 2019-02-17 ENCOUNTER — Ambulatory Visit: Payer: PPO | Admitting: Podiatry

## 2019-02-17 ENCOUNTER — Other Ambulatory Visit: Payer: Self-pay

## 2019-02-17 ENCOUNTER — Encounter: Payer: Self-pay | Admitting: Podiatry

## 2019-02-17 DIAGNOSIS — M79675 Pain in left toe(s): Secondary | ICD-10-CM

## 2019-02-17 DIAGNOSIS — B351 Tinea unguium: Secondary | ICD-10-CM

## 2019-02-17 DIAGNOSIS — M79674 Pain in right toe(s): Secondary | ICD-10-CM

## 2019-02-17 NOTE — Patient Instructions (Signed)

## 2019-02-19 NOTE — Progress Notes (Signed)
Subjective: Christine [name] presents today for follow up of painful mycotic nails b/l that are difficult to trim. Pain interferes with ambulation. Aggravating factors include wearing enclosed shoe gear. Pain is relieved with periodic professional debridement.   Allergies  Allergen Reactions  . Gabapentin Other (See Comments)    UNKNOWN     Objective: There were no vitals filed for this visit.  Vascular Examination:  Capillary refill time to digits immediate b/l, palpable DP pulses b/l, palpable PT pulses b/l, pedal hair absent b/l and skin temperature gradient within normal limits b/l  Dermatological Examination: Pedal skin with normal turgor, texture and tone bilaterally, no open wounds bilaterally, no interdigital macerations bilaterally and toenails 1-5 b/l elongated, dystrophic, thickened, crumbly with subungual debris  Musculoskeletal: Normal muscle strength 5/5 to all lower extremity muscle groups bilaterally, no pain crepitus or joint limitation noted with ROM b/l, bunion deformity noted b/l and b/l juxtamalleolar lipomas b/l ankles.  Neurological: Protective sensation intact 5/5 intact bilaterally with 10g monofilament b/l and vibratory sensation intact b/l  Assessment: 1. Pain due to onychomycosis of toenails of both feet    Plan: -Toenails 1-5 b/l were debrided in length and girth without iatrogenic bleeding. -Patient to continue soft, supportive shoe gear daily. -Patient to report any pedal injuries to medical professional immediately. -Patient/POA to call should there be question/concern in the interim.  Return in about 3 months (around 05/17/2019) for nail trim/ Xarelto.

## 2019-03-02 DIAGNOSIS — I10 Essential (primary) hypertension: Secondary | ICD-10-CM | POA: Diagnosis not present

## 2019-03-02 DIAGNOSIS — M810 Age-related osteoporosis without current pathological fracture: Secondary | ICD-10-CM | POA: Diagnosis not present

## 2019-03-02 DIAGNOSIS — E7849 Other hyperlipidemia: Secondary | ICD-10-CM | POA: Diagnosis not present

## 2019-03-02 DIAGNOSIS — D6859 Other primary thrombophilia: Secondary | ICD-10-CM | POA: Insufficient documentation

## 2019-03-02 DIAGNOSIS — R82998 Other abnormal findings in urine: Secondary | ICD-10-CM | POA: Diagnosis not present

## 2019-03-09 ENCOUNTER — Ambulatory Visit: Payer: Medicare Other | Admitting: Physician Assistant

## 2019-03-17 DIAGNOSIS — F329 Major depressive disorder, single episode, unspecified: Secondary | ICD-10-CM | POA: Diagnosis not present

## 2019-03-17 DIAGNOSIS — M810 Age-related osteoporosis without current pathological fracture: Secondary | ICD-10-CM | POA: Diagnosis not present

## 2019-03-17 DIAGNOSIS — D692 Other nonthrombocytopenic purpura: Secondary | ICD-10-CM | POA: Diagnosis not present

## 2019-03-17 DIAGNOSIS — I480 Paroxysmal atrial fibrillation: Secondary | ICD-10-CM | POA: Diagnosis not present

## 2019-03-17 DIAGNOSIS — H6123 Impacted cerumen, bilateral: Secondary | ICD-10-CM | POA: Diagnosis not present

## 2019-03-17 DIAGNOSIS — Z0000 Encounter for general adult medical examination without abnormal findings: Secondary | ICD-10-CM | POA: Diagnosis not present

## 2019-03-17 DIAGNOSIS — M6282 Rhabdomyolysis: Secondary | ICD-10-CM | POA: Diagnosis not present

## 2019-03-17 DIAGNOSIS — N3010 Interstitial cystitis (chronic) without hematuria: Secondary | ICD-10-CM | POA: Diagnosis not present

## 2019-03-17 DIAGNOSIS — N390 Urinary tract infection, site not specified: Secondary | ICD-10-CM | POA: Diagnosis not present

## 2019-03-17 DIAGNOSIS — Z1331 Encounter for screening for depression: Secondary | ICD-10-CM | POA: Diagnosis not present

## 2019-03-17 DIAGNOSIS — D6869 Other thrombophilia: Secondary | ICD-10-CM | POA: Diagnosis not present

## 2019-03-17 DIAGNOSIS — R300 Dysuria: Secondary | ICD-10-CM | POA: Diagnosis not present

## 2019-03-17 DIAGNOSIS — G459 Transient cerebral ischemic attack, unspecified: Secondary | ICD-10-CM | POA: Diagnosis not present

## 2019-05-01 DIAGNOSIS — D0472 Carcinoma in situ of skin of left lower limb, including hip: Secondary | ICD-10-CM | POA: Diagnosis not present

## 2019-05-01 DIAGNOSIS — D485 Neoplasm of uncertain behavior of skin: Secondary | ICD-10-CM | POA: Diagnosis not present

## 2019-05-01 DIAGNOSIS — D0439 Carcinoma in situ of skin of other parts of face: Secondary | ICD-10-CM | POA: Diagnosis not present

## 2019-05-01 DIAGNOSIS — D0461 Carcinoma in situ of skin of right upper limb, including shoulder: Secondary | ICD-10-CM | POA: Diagnosis not present

## 2019-05-01 DIAGNOSIS — L821 Other seborrheic keratosis: Secondary | ICD-10-CM | POA: Diagnosis not present

## 2019-05-01 DIAGNOSIS — L812 Freckles: Secondary | ICD-10-CM | POA: Diagnosis not present

## 2019-05-01 DIAGNOSIS — C44529 Squamous cell carcinoma of skin of other part of trunk: Secondary | ICD-10-CM | POA: Diagnosis not present

## 2019-05-17 ENCOUNTER — Ambulatory Visit: Payer: PPO | Admitting: Podiatry

## 2019-07-05 DIAGNOSIS — D0439 Carcinoma in situ of skin of other parts of face: Secondary | ICD-10-CM | POA: Diagnosis not present

## 2019-07-05 DIAGNOSIS — Z85828 Personal history of other malignant neoplasm of skin: Secondary | ICD-10-CM | POA: Diagnosis not present

## 2019-07-18 ENCOUNTER — Ambulatory Visit: Payer: PPO | Admitting: Podiatry

## 2019-08-09 DIAGNOSIS — F329 Major depressive disorder, single episode, unspecified: Secondary | ICD-10-CM | POA: Diagnosis not present

## 2019-08-09 DIAGNOSIS — E785 Hyperlipidemia, unspecified: Secondary | ICD-10-CM | POA: Diagnosis not present

## 2019-08-09 DIAGNOSIS — I480 Paroxysmal atrial fibrillation: Secondary | ICD-10-CM | POA: Diagnosis not present

## 2019-08-09 DIAGNOSIS — D6869 Other thrombophilia: Secondary | ICD-10-CM | POA: Diagnosis not present

## 2019-08-09 DIAGNOSIS — G8929 Other chronic pain: Secondary | ICD-10-CM | POA: Diagnosis not present

## 2019-08-09 DIAGNOSIS — D649 Anemia, unspecified: Secondary | ICD-10-CM | POA: Diagnosis not present

## 2019-08-09 DIAGNOSIS — N3010 Interstitial cystitis (chronic) without hematuria: Secondary | ICD-10-CM | POA: Diagnosis not present

## 2019-08-09 DIAGNOSIS — G459 Transient cerebral ischemic attack, unspecified: Secondary | ICD-10-CM | POA: Diagnosis not present

## 2019-08-09 DIAGNOSIS — I10 Essential (primary) hypertension: Secondary | ICD-10-CM | POA: Diagnosis not present

## 2019-08-09 DIAGNOSIS — F0390 Unspecified dementia without behavioral disturbance: Secondary | ICD-10-CM | POA: Diagnosis not present

## 2019-08-09 DIAGNOSIS — M810 Age-related osteoporosis without current pathological fracture: Secondary | ICD-10-CM | POA: Diagnosis not present

## 2019-08-09 DIAGNOSIS — H6123 Impacted cerumen, bilateral: Secondary | ICD-10-CM | POA: Diagnosis not present

## 2019-08-22 DIAGNOSIS — H6123 Impacted cerumen, bilateral: Secondary | ICD-10-CM | POA: Insufficient documentation

## 2019-08-30 ENCOUNTER — Encounter: Payer: Self-pay | Admitting: Podiatry

## 2019-08-30 ENCOUNTER — Ambulatory Visit: Payer: PPO | Admitting: Podiatry

## 2019-08-30 ENCOUNTER — Other Ambulatory Visit: Payer: Self-pay

## 2019-08-30 DIAGNOSIS — Q828 Other specified congenital malformations of skin: Secondary | ICD-10-CM

## 2019-08-30 DIAGNOSIS — M79672 Pain in left foot: Secondary | ICD-10-CM

## 2019-08-30 DIAGNOSIS — B351 Tinea unguium: Secondary | ICD-10-CM

## 2019-08-30 DIAGNOSIS — M79674 Pain in right toe(s): Secondary | ICD-10-CM | POA: Diagnosis not present

## 2019-08-30 DIAGNOSIS — M79675 Pain in left toe(s): Secondary | ICD-10-CM

## 2019-08-30 DIAGNOSIS — M79671 Pain in right foot: Secondary | ICD-10-CM

## 2019-09-03 NOTE — Progress Notes (Signed)
Subjective: Christine [name] presents today for follow up of painful porokeratotic lesion(s) right and painful mycotic toenails that limit ambulation. Painful toenails interfere with ambulation. Aggravating factors include wearing enclosed shoe gear. Pain is relieved with periodic professional debridement. Painful porokeratotic lesions are aggravated when weightbearing with and without shoegear. Pain is relieved with periodic professional debridement..   Allergies  Allergen Reactions  . Gabapentin Other (See Comments)    UNKNOWN     Objective: There were no vitals filed for this visit.  Vascular Examination:  Capillary refill time to digits immediate b/l, palpable DP pulses b/l, palpable PT pulses b/l, pedal hair absent b/l and skin temperature gradient within normal limits b/l  Dermatological Examination: Pedal skin with normal turgor, texture and tone bilaterally. No open wounds bilaterally. No interdigital macerations bilaterally. Toenails 1-5 b/l elongated, discolored, dystrophic, thickened, crumbly with subungual debris and tenderness to dorsal palpation. Porokeratotic lesion(s) submet head 2 right foot. No erythema, no edema, no drainage, no flocculence.  Musculoskeletal: Normal muscle strength 5/5 to all lower extremity muscle groups bilaterally, no pain crepitus or joint limitation noted with ROM b/l, bunion deformity noted b/l and b/l juxtamalleolar lipomas b/l ankles.  Neurological: Protective sensation intact 5/5 intact bilaterally with 10g monofilament b/l and vibratory sensation intact b/l  Assessment: 1. Pain due to onychomycosis of toenails of both feet   2. Porokeratosis   3. Pain in both feet    Plan: -Examined patient. -Toenails 1-5 b/l were debrided in length and girth with sterile nail nippers and dremel without iatrogenic bleeding.  -Painful porokeratotic lesion(s) submet head 2 right foot pared and enucleated with sterile scalpel blade without incident. -Patient  to report any pedal injuries to medical professional immediately. -Patient to continue soft, supportive shoe gear daily. -Patient/POA to call should there be question/concern in the interim.  Return in about 3 months (around 11/30/2019) for nail trim.

## 2019-09-07 DIAGNOSIS — Z85828 Personal history of other malignant neoplasm of skin: Secondary | ICD-10-CM | POA: Diagnosis not present

## 2019-09-07 DIAGNOSIS — L821 Other seborrheic keratosis: Secondary | ICD-10-CM | POA: Diagnosis not present

## 2019-11-02 DIAGNOSIS — N3010 Interstitial cystitis (chronic) without hematuria: Secondary | ICD-10-CM | POA: Diagnosis not present

## 2020-01-03 ENCOUNTER — Encounter: Payer: Self-pay | Admitting: Podiatry

## 2020-01-03 ENCOUNTER — Other Ambulatory Visit: Payer: Self-pay

## 2020-01-03 ENCOUNTER — Ambulatory Visit: Payer: PPO | Admitting: Podiatry

## 2020-01-03 DIAGNOSIS — M79674 Pain in right toe(s): Secondary | ICD-10-CM

## 2020-01-03 DIAGNOSIS — M79675 Pain in left toe(s): Secondary | ICD-10-CM

## 2020-01-03 DIAGNOSIS — Q828 Other specified congenital malformations of skin: Secondary | ICD-10-CM | POA: Diagnosis not present

## 2020-01-03 DIAGNOSIS — M79672 Pain in left foot: Secondary | ICD-10-CM | POA: Diagnosis not present

## 2020-01-03 DIAGNOSIS — M79671 Pain in right foot: Secondary | ICD-10-CM

## 2020-01-03 DIAGNOSIS — Z9229 Personal history of other drug therapy: Secondary | ICD-10-CM

## 2020-01-03 DIAGNOSIS — B351 Tinea unguium: Secondary | ICD-10-CM | POA: Diagnosis not present

## 2020-01-04 DIAGNOSIS — L820 Inflamed seborrheic keratosis: Secondary | ICD-10-CM | POA: Diagnosis not present

## 2020-01-05 NOTE — Progress Notes (Signed)
Subjective: Christine [name] presents today for follow up of painful porokeratotic lesion(s) right and painful mycotic toenails that limit ambulation. Painful toenails interfere with ambulation. Aggravating factors include wearing enclosed shoe gear. Pain is relieved with periodic professional debridement. Painful porokeratotic lesions are aggravated when weightbearing with and without shoegear. Pain is relieved with periodic professional debridement..   Allergies  Allergen Reactions  . Gabapentin Other (See Comments)    UNKNOWN     Objective: There were no vitals filed for this visit.  Vascular Examination:  Capillary refill time to digits immediate b/l, palpable DP pulses b/l, palpable PT pulses b/l, pedal hair absent b/l and skin temperature gradient within normal limits b/l  Dermatological Examination: Pedal skin with normal turgor, texture and tone bilaterally. No open wounds bilaterally. No interdigital macerations bilaterally. Toenails 1-5 b/l elongated, discolored, dystrophic, thickened, crumbly with subungual debris and tenderness to dorsal palpation. Porokeratotic lesion(s) submet head 1 right foot and submet head 3 right foot. No erythema, no edema, no drainage, no fluctuance.  Musculoskeletal: Normal muscle strength 5/5 to all lower extremity muscle groups bilaterally, no pain crepitus or joint limitation noted with ROM b/l, bunion deformity noted b/l and b/l juxtamalleolar lipomas b/l ankles.  Neurological: Protective sensation intact 5/5 intact bilaterally with 10g monofilament b/l and vibratory sensation intact b/l  Assessment: 1. Pain due to onychomycosis of toenails of both feet   2. Porokeratosis   3. Pain in both feet   4. Hx of long term use of blood thinners    Plan: -Examined patient. -Toenails 1-5 b/l were debrided in length and girth with sterile nail nippers and dremel without iatrogenic bleeding.  -Painful porokeratotic lesion(s) submet head 1 right foot and  submet head 3 right foot pared and enucleated with sterile scalpel blade without incident. -Patient to report any pedal injuries to medical professional immediately. -Patient/POA to call should there be question/concern in the interim.  Return in about 3 months (around 04/02/2020).

## 2020-02-14 DIAGNOSIS — H52223 Regular astigmatism, bilateral: Secondary | ICD-10-CM | POA: Diagnosis not present

## 2020-02-14 DIAGNOSIS — H02834 Dermatochalasis of left upper eyelid: Secondary | ICD-10-CM | POA: Diagnosis not present

## 2020-02-14 DIAGNOSIS — Z961 Presence of intraocular lens: Secondary | ICD-10-CM | POA: Diagnosis not present

## 2020-02-14 DIAGNOSIS — Z9889 Other specified postprocedural states: Secondary | ICD-10-CM | POA: Diagnosis not present

## 2020-02-14 DIAGNOSIS — H5213 Myopia, bilateral: Secondary | ICD-10-CM | POA: Diagnosis not present

## 2020-02-14 DIAGNOSIS — H43393 Other vitreous opacities, bilateral: Secondary | ICD-10-CM | POA: Diagnosis not present

## 2020-02-14 DIAGNOSIS — H40013 Open angle with borderline findings, low risk, bilateral: Secondary | ICD-10-CM | POA: Diagnosis not present

## 2020-02-14 DIAGNOSIS — H524 Presbyopia: Secondary | ICD-10-CM | POA: Diagnosis not present

## 2020-02-14 DIAGNOSIS — H02831 Dermatochalasis of right upper eyelid: Secondary | ICD-10-CM | POA: Diagnosis not present

## 2020-02-14 DIAGNOSIS — H538 Other visual disturbances: Secondary | ICD-10-CM | POA: Diagnosis not present

## 2020-03-21 DIAGNOSIS — E559 Vitamin D deficiency, unspecified: Secondary | ICD-10-CM | POA: Diagnosis not present

## 2020-03-21 DIAGNOSIS — I10 Essential (primary) hypertension: Secondary | ICD-10-CM | POA: Diagnosis not present

## 2020-03-21 DIAGNOSIS — E785 Hyperlipidemia, unspecified: Secondary | ICD-10-CM | POA: Diagnosis not present

## 2020-03-28 DIAGNOSIS — H6123 Impacted cerumen, bilateral: Secondary | ICD-10-CM | POA: Diagnosis not present

## 2020-03-28 DIAGNOSIS — R209 Unspecified disturbances of skin sensation: Secondary | ICD-10-CM | POA: Diagnosis not present

## 2020-03-28 DIAGNOSIS — M810 Age-related osteoporosis without current pathological fracture: Secondary | ICD-10-CM | POA: Diagnosis not present

## 2020-03-28 DIAGNOSIS — M5416 Radiculopathy, lumbar region: Secondary | ICD-10-CM | POA: Diagnosis not present

## 2020-03-28 DIAGNOSIS — E785 Hyperlipidemia, unspecified: Secondary | ICD-10-CM | POA: Diagnosis not present

## 2020-03-28 DIAGNOSIS — F0390 Unspecified dementia without behavioral disturbance: Secondary | ICD-10-CM | POA: Diagnosis not present

## 2020-03-28 DIAGNOSIS — D692 Other nonthrombocytopenic purpura: Secondary | ICD-10-CM | POA: Diagnosis not present

## 2020-03-28 DIAGNOSIS — I10 Essential (primary) hypertension: Secondary | ICD-10-CM | POA: Diagnosis not present

## 2020-03-28 DIAGNOSIS — R82998 Other abnormal findings in urine: Secondary | ICD-10-CM | POA: Diagnosis not present

## 2020-03-28 DIAGNOSIS — Z0000 Encounter for general adult medical examination without abnormal findings: Secondary | ICD-10-CM | POA: Diagnosis not present

## 2020-03-28 DIAGNOSIS — G459 Transient cerebral ischemic attack, unspecified: Secondary | ICD-10-CM | POA: Diagnosis not present

## 2020-03-28 DIAGNOSIS — D6869 Other thrombophilia: Secondary | ICD-10-CM | POA: Diagnosis not present

## 2020-03-28 DIAGNOSIS — G894 Chronic pain syndrome: Secondary | ICD-10-CM | POA: Diagnosis not present

## 2020-04-03 ENCOUNTER — Other Ambulatory Visit (HOSPITAL_COMMUNITY): Payer: Self-pay | Admitting: Internal Medicine

## 2020-04-03 ENCOUNTER — Ambulatory Visit: Payer: Medicare Other | Attending: Internal Medicine

## 2020-04-03 ENCOUNTER — Ambulatory Visit: Payer: PPO | Admitting: Podiatry

## 2020-04-03 DIAGNOSIS — Z23 Encounter for immunization: Secondary | ICD-10-CM

## 2020-04-03 NOTE — Progress Notes (Signed)
Covid-19 Vaccination [facility]  Name:  EMMANUELA PERRINS    MRN: [id number] DOB: 09-05-1933  04/03/2020  Ms. Glazer was observed post Covid-19 immunization for 15 minutes without incident. She was provided with Vaccine Information Sheet and instruction to access the V-Safe system.   Ms. Ryon was instructed to call 911 with any severe reactions post vaccine: Marland Kitchen Difficulty breathing  . Swelling of face and throat  . A fast heartbeat  . A bad rash all over body  . Dizziness and weakness   Immunizations Administered    Name Date Dose VIS Date Route   Moderna Covid-19 Booster Vaccine 04/03/2020 12:38 PM 0.25 mL 10/25/2019 Intramuscular   Manufacturer: Moderna   Lot: [id number]   NDC: 14782-956-21

## 2020-05-08 DIAGNOSIS — L570 Actinic keratosis: Secondary | ICD-10-CM | POA: Diagnosis not present

## 2020-05-08 DIAGNOSIS — L812 Freckles: Secondary | ICD-10-CM | POA: Diagnosis not present

## 2020-05-08 DIAGNOSIS — L821 Other seborrheic keratosis: Secondary | ICD-10-CM | POA: Diagnosis not present

## 2020-05-08 DIAGNOSIS — D1801 Hemangioma of skin and subcutaneous tissue: Secondary | ICD-10-CM | POA: Diagnosis not present

## 2020-06-07 ENCOUNTER — Ambulatory Visit: Payer: PPO | Admitting: Podiatry

## 2020-08-30 ENCOUNTER — Ambulatory Visit: Payer: PPO | Admitting: Podiatry

## 2020-08-30 ENCOUNTER — Other Ambulatory Visit: Payer: Self-pay

## 2020-08-30 DIAGNOSIS — M79675 Pain in left toe(s): Secondary | ICD-10-CM | POA: Diagnosis not present

## 2020-08-30 DIAGNOSIS — M25561 Pain in right knee: Secondary | ICD-10-CM | POA: Insufficient documentation

## 2020-08-30 DIAGNOSIS — E559 Vitamin D deficiency, unspecified: Secondary | ICD-10-CM | POA: Insufficient documentation

## 2020-08-30 DIAGNOSIS — B351 Tinea unguium: Secondary | ICD-10-CM | POA: Diagnosis not present

## 2020-08-30 DIAGNOSIS — F0390 Unspecified dementia without behavioral disturbance: Secondary | ICD-10-CM | POA: Insufficient documentation

## 2020-08-30 DIAGNOSIS — M79674 Pain in right toe(s): Secondary | ICD-10-CM | POA: Diagnosis not present

## 2020-09-04 ENCOUNTER — Encounter: Payer: Self-pay | Admitting: Podiatry

## 2020-09-04 NOTE — Progress Notes (Signed)
Subjective:  Patient ID: Christine [name], female    DOB: 1933-09-13,  MRN: [id number]  87 y.o. female presents painful porokeratotic lesion(s) b/l feet and painful mycotic toenails that limit ambulation. Painful toenails interfere with ambulation. Aggravating factors include wearing enclosed shoe gear. Pain is relieved with periodic professional debridement. Painful porokeratotic lesions are aggravated when weightbearing with and without shoegear. Pain is relieved with periodic professional debridement.  She voices no new pedal problems on today's visit.  PCP is Rodrigo Ran, MD , and last visit was 03/28/2020.  Allergies  Allergen Reactions   Atorvastatin     Other reaction(s): hot flashes; felt like walking/leaning to L side; constipation   Citalopram     Other reaction(s): felt it made the inside and outside of her skin burn.it is not clear if this is an allergy or just an adverse reaction of some kind.   Duloxetine Hcl     Other reaction(s): she can't tolerate it and wanted it listed as an allergy   Ezetimibe     Other reaction(s): "had problems with it"   Fluvastatin     Other reaction(s): made legs ache.   Gabapentin Other (See Comments)    UNKNOWN   Oxybutynin Chloride     Other reaction(s): throat swelling; burning eyelids; nausea   Pravastatin     Other reaction(s): "had problems with it"   Pregabalin     Other reaction(s): made her feel funny   Simvastatin     Other reaction(s): leg pain   Zolpidem Tartrate     Other reaction(s): hallucinations.    Review of Systems: Negative except as noted in the HPI.   Objective:  Vascular Examination: Vascular status intact b/l with palpable pedal pulses. CFT immediate b/l. No edema. No pain with calf compression b/l. Skin temperature gradient WNL b/l. Pedal hair present. No edema noted b/l lower extremities.  Neurological Examination: Sensation grossly intact b/l with 10 gram monofilament. Vibratory sensation intact b/l.    Dermatological Examination: Pedal skin with normal turgor, texture and tone b/l. Toenails 1-5 b/l thick, discolored, elongated with subungual debris and pain on dorsal palpation. No hyperkeratotic/porokeratotic lesions noted b/l.   Musculoskeletal Examination: Muscle strength 5/5 to b/l LE. Hallux valgus with bunion deformity noted b/l lower extremities.  Radiographs: None Assessment:   1. Pain due to onychomycosis of toenails of both feet     Plan:  -Examined patient. -No new findings. No new orders. -Patient to continue soft, supportive shoe gear daily. -Toenails 1-5 b/l were debrided in length and girth with sterile nail nippers and dremel without iatrogenic bleeding.  -Patient to report any pedal injuries to medical professional immediately. -Patient/POA to call should there be question/concern in the interim.  Return in about 3 months (around 11/30/2020).  Freddie Breech, DPM

## 2020-10-01 DIAGNOSIS — D692 Other nonthrombocytopenic purpura: Secondary | ICD-10-CM | POA: Diagnosis not present

## 2020-10-01 DIAGNOSIS — Z23 Encounter for immunization: Secondary | ICD-10-CM | POA: Diagnosis not present

## 2020-10-01 DIAGNOSIS — E785 Hyperlipidemia, unspecified: Secondary | ICD-10-CM | POA: Diagnosis not present

## 2020-10-01 DIAGNOSIS — F419 Anxiety disorder, unspecified: Secondary | ICD-10-CM | POA: Diagnosis not present

## 2020-10-01 DIAGNOSIS — G459 Transient cerebral ischemic attack, unspecified: Secondary | ICD-10-CM | POA: Diagnosis not present

## 2020-10-01 DIAGNOSIS — G8929 Other chronic pain: Secondary | ICD-10-CM | POA: Diagnosis not present

## 2020-10-01 DIAGNOSIS — I480 Paroxysmal atrial fibrillation: Secondary | ICD-10-CM | POA: Diagnosis not present

## 2020-10-01 DIAGNOSIS — I10 Essential (primary) hypertension: Secondary | ICD-10-CM | POA: Diagnosis not present

## 2020-10-01 DIAGNOSIS — D6869 Other thrombophilia: Secondary | ICD-10-CM | POA: Diagnosis not present

## 2020-10-01 DIAGNOSIS — F0390 Unspecified dementia without behavioral disturbance: Secondary | ICD-10-CM | POA: Diagnosis not present

## 2020-10-01 DIAGNOSIS — M810 Age-related osteoporosis without current pathological fracture: Secondary | ICD-10-CM | POA: Diagnosis not present

## 2020-10-23 DIAGNOSIS — Z87442 Personal history of urinary calculi: Secondary | ICD-10-CM | POA: Insufficient documentation

## 2020-11-01 DIAGNOSIS — R351 Nocturia: Secondary | ICD-10-CM | POA: Diagnosis not present

## 2020-11-01 DIAGNOSIS — Z87442 Personal history of urinary calculi: Secondary | ICD-10-CM | POA: Diagnosis not present

## 2020-11-01 DIAGNOSIS — R350 Frequency of micturition: Secondary | ICD-10-CM | POA: Diagnosis not present

## 2020-11-01 DIAGNOSIS — N3010 Interstitial cystitis (chronic) without hematuria: Secondary | ICD-10-CM | POA: Diagnosis not present

## 2020-11-01 DIAGNOSIS — R102 Pelvic and perineal pain: Secondary | ICD-10-CM | POA: Diagnosis not present

## 2020-11-12 ENCOUNTER — Encounter (HOSPITAL_COMMUNITY): Payer: Medicare Other

## 2020-11-18 ENCOUNTER — Other Ambulatory Visit (HOSPITAL_COMMUNITY): Payer: Self-pay

## 2020-11-19 ENCOUNTER — Other Ambulatory Visit: Payer: Self-pay

## 2020-11-19 ENCOUNTER — Ambulatory Visit (HOSPITAL_COMMUNITY)
Admission: RE | Admit: 2020-11-19 | Discharge: 2020-11-19 | Disposition: A | Discharge status: Home / Self Care | Payer: PPO | Source: Ambulatory Visit | Attending: Internal Medicine | Admitting: Internal Medicine

## 2020-11-19 DIAGNOSIS — M810 Age-related osteoporosis without current pathological fracture: Secondary | ICD-10-CM | POA: Insufficient documentation

## 2020-11-19 MED ORDER — DENOSUMAB 60 MG/ML ~~LOC~~ SOSY
PREFILLED_SYRINGE | SUBCUTANEOUS | Status: AC
  Administered 2020-11-19: 60 mg via SUBCUTANEOUS
  Filled 2020-11-19: qty 1

## 2020-11-19 MED ORDER — DENOSUMAB 60 MG/ML ~~LOC~~ SOSY: 60.0000 mg | PREFILLED_SYRINGE | Freq: Once | SUBCUTANEOUS | Status: AC

## 2020-12-05 DIAGNOSIS — C4402 Squamous cell carcinoma of skin of lip: Secondary | ICD-10-CM | POA: Diagnosis not present

## 2020-12-05 DIAGNOSIS — L820 Inflamed seborrheic keratosis: Secondary | ICD-10-CM | POA: Diagnosis not present

## 2020-12-05 DIAGNOSIS — L821 Other seborrheic keratosis: Secondary | ICD-10-CM | POA: Diagnosis not present

## 2020-12-05 DIAGNOSIS — D485 Neoplasm of uncertain behavior of skin: Secondary | ICD-10-CM | POA: Diagnosis not present

## 2020-12-05 DIAGNOSIS — C44329 Squamous cell carcinoma of skin of other parts of face: Secondary | ICD-10-CM | POA: Diagnosis not present

## 2021-01-23 DIAGNOSIS — Z85828 Personal history of other malignant neoplasm of skin: Secondary | ICD-10-CM | POA: Diagnosis not present

## 2021-01-23 DIAGNOSIS — C4402 Squamous cell carcinoma of skin of lip: Secondary | ICD-10-CM | POA: Diagnosis not present

## 2021-02-10 ENCOUNTER — Ambulatory Visit: Payer: PPO | Admitting: Podiatry

## 2021-02-10 ENCOUNTER — Other Ambulatory Visit: Payer: Self-pay

## 2021-02-10 ENCOUNTER — Encounter: Payer: Self-pay | Admitting: Podiatry

## 2021-02-10 DIAGNOSIS — B351 Tinea unguium: Secondary | ICD-10-CM | POA: Diagnosis not present

## 2021-02-10 DIAGNOSIS — M79675 Pain in left toe(s): Secondary | ICD-10-CM

## 2021-02-10 DIAGNOSIS — M79674 Pain in right toe(s): Secondary | ICD-10-CM

## 2021-02-10 NOTE — Progress Notes (Signed)
Subjective:  Patient ID: Christine [name], female    DOB: [birth year],  MRN: [id number]  88 y.o. female presents painful porokeratotic lesion(s) b/l feet and painful mycotic toenails that limit ambulation. Painful toenails interfere with ambulation. Aggravating factors include wearing enclosed shoe gear. Pain is relieved with periodic professional debridement. Painful porokeratotic lesions are aggravated when weightbearing with and without shoegear. Pain is relieved with periodic professional debridement.  She voices no new pedal problems on today's visit.  PCP is Rodrigo Ran, MD , and last visit was 03/28/2020.  Allergies  Allergen Reactions   Atorvastatin     Other reaction(s): hot flashes; felt like walking/leaning to L side; constipation   Citalopram     Other reaction(s): felt it made the inside and outside of her skin burn.it is not clear if this is an allergy or just an adverse reaction of some kind.   Duloxetine Hcl     Other reaction(s): she can't tolerate it and wanted it listed as an allergy   Ezetimibe     Other reaction(s): "had problems with it"   Fluvastatin     Other reaction(s): made legs ache.   Gabapentin Other (See Comments)    UNKNOWN   Oxybutynin Chloride     Other reaction(s): throat swelling; burning eyelids; nausea   Pravastatin     Other reaction(s): "had problems with it"   Pregabalin     Other reaction(s): made her feel funny   Simvastatin     Other reaction(s): leg pain   Zolpidem Tartrate     Other reaction(s): hallucinations.    Review of Systems: Negative except as noted in the HPI.   Objective:  Vascular Examination: Vascular status intact b/l with palpable pedal pulses. CFT immediate b/l. No edema. No pain with calf compression b/l. Skin temperature gradient WNL b/l. Pedal hair present. No edema noted b/l lower extremities.  Neurological Examination: Sensation grossly intact b/l with 10 gram monofilament. Vibratory sensation intact b/l.    Dermatological Examination: Pedal skin with normal turgor, texture and tone b/l. Toenails 1-5 b/l thick, discolored, elongated with subungual debris and pain on dorsal palpation. No hyperkeratotic/porokeratotic lesions noted b/l.   Musculoskeletal Examination: Muscle strength 5/5 to b/l LE. Hallux valgus with bunion deformity noted b/l lower extremities.  Radiographs: None Assessment:   No diagnosis found.   Plan:  -Examined patient. -No new findings. No new orders. -Patient to continue soft, supportive shoe gear daily. -Toenails 1-5 b/l were debrided in length and girth with sterile nail nippers and dremel without iatrogenic bleeding.  -Patient to report any pedal injuries to medical professional immediately. -Patient/POA to call should there be question/concern in the interim.  No follow-ups on file.  Louann Sjogren, MD

## 2021-02-17 DIAGNOSIS — H02831 Dermatochalasis of right upper eyelid: Secondary | ICD-10-CM | POA: Diagnosis not present

## 2021-02-17 DIAGNOSIS — H02834 Dermatochalasis of left upper eyelid: Secondary | ICD-10-CM | POA: Diagnosis not present

## 2021-02-17 DIAGNOSIS — Z9889 Other specified postprocedural states: Secondary | ICD-10-CM | POA: Diagnosis not present

## 2021-02-17 DIAGNOSIS — H43393 Other vitreous opacities, bilateral: Secondary | ICD-10-CM | POA: Diagnosis not present

## 2021-02-17 DIAGNOSIS — Z961 Presence of intraocular lens: Secondary | ICD-10-CM | POA: Diagnosis not present

## 2021-02-17 DIAGNOSIS — H40013 Open angle with borderline findings, low risk, bilateral: Secondary | ICD-10-CM | POA: Diagnosis not present

## 2021-03-05 ENCOUNTER — Ambulatory Visit: Payer: PPO | Admitting: Podiatry

## 2021-03-13 ENCOUNTER — Emergency Department (HOSPITAL_BASED_OUTPATIENT_CLINIC_OR_DEPARTMENT_OTHER): Payer: PPO

## 2021-03-13 ENCOUNTER — Other Ambulatory Visit: Payer: Self-pay

## 2021-03-13 ENCOUNTER — Inpatient Hospital Stay (HOSPITAL_BASED_OUTPATIENT_CLINIC_OR_DEPARTMENT_OTHER)
Admission: EM | Admit: 2021-03-13 | Discharge: 2021-03-15 | DRG: 558 | Disposition: A | Discharge status: Home / Self Care | Payer: PPO | Attending: Internal Medicine | Admitting: Internal Medicine

## 2021-03-13 ENCOUNTER — Encounter (HOSPITAL_BASED_OUTPATIENT_CLINIC_OR_DEPARTMENT_OTHER): Payer: Self-pay | Admitting: Emergency Medicine

## 2021-03-13 DIAGNOSIS — Y92010 Kitchen of single-family (private) house as the place of occurrence of the external cause: Secondary | ICD-10-CM | POA: Diagnosis not present

## 2021-03-13 DIAGNOSIS — N179 Acute kidney failure, unspecified: Secondary | ICD-10-CM

## 2021-03-13 DIAGNOSIS — N3010 Interstitial cystitis (chronic) without hematuria: Secondary | ICD-10-CM | POA: Diagnosis not present

## 2021-03-13 DIAGNOSIS — W1830XA Fall on same level, unspecified, initial encounter: Secondary | ICD-10-CM | POA: Diagnosis present

## 2021-03-13 DIAGNOSIS — R296 Repeated falls: Secondary | ICD-10-CM

## 2021-03-13 DIAGNOSIS — E860 Dehydration: Secondary | ICD-10-CM | POA: Diagnosis present

## 2021-03-13 DIAGNOSIS — E440 Moderate protein-calorie malnutrition: Secondary | ICD-10-CM | POA: Diagnosis not present

## 2021-03-13 DIAGNOSIS — Z79899 Other long term (current) drug therapy: Secondary | ICD-10-CM

## 2021-03-13 DIAGNOSIS — K219 Gastro-esophageal reflux disease without esophagitis: Secondary | ICD-10-CM | POA: Diagnosis present

## 2021-03-13 DIAGNOSIS — E538 Deficiency of other specified B group vitamins: Secondary | ICD-10-CM | POA: Diagnosis not present

## 2021-03-13 DIAGNOSIS — M6282 Rhabdomyolysis: Secondary | ICD-10-CM | POA: Diagnosis not present

## 2021-03-13 DIAGNOSIS — M1990 Unspecified osteoarthritis, unspecified site: Secondary | ICD-10-CM | POA: Diagnosis not present

## 2021-03-13 DIAGNOSIS — I480 Paroxysmal atrial fibrillation: Secondary | ICD-10-CM

## 2021-03-13 DIAGNOSIS — Z6824 Body mass index (BMI) 24.0-24.9, adult: Secondary | ICD-10-CM

## 2021-03-13 DIAGNOSIS — E785 Hyperlipidemia, unspecified: Secondary | ICD-10-CM | POA: Diagnosis not present

## 2021-03-13 DIAGNOSIS — Z66 Do not resuscitate: Secondary | ICD-10-CM | POA: Diagnosis present

## 2021-03-13 DIAGNOSIS — I10 Essential (primary) hypertension: Secondary | ICD-10-CM | POA: Diagnosis present

## 2021-03-13 DIAGNOSIS — Z96642 Presence of left artificial hip joint: Secondary | ICD-10-CM | POA: Diagnosis not present

## 2021-03-13 DIAGNOSIS — I6523 Occlusion and stenosis of bilateral carotid arteries: Secondary | ICD-10-CM | POA: Diagnosis not present

## 2021-03-13 DIAGNOSIS — I214 Non-ST elevation (NSTEMI) myocardial infarction: Secondary | ICD-10-CM | POA: Diagnosis not present

## 2021-03-13 DIAGNOSIS — Z20822 Contact with and (suspected) exposure to covid-19: Secondary | ICD-10-CM | POA: Diagnosis not present

## 2021-03-13 DIAGNOSIS — Z792 Long term (current) use of antibiotics: Secondary | ICD-10-CM

## 2021-03-13 DIAGNOSIS — D649 Anemia, unspecified: Secondary | ICD-10-CM | POA: Diagnosis not present

## 2021-03-13 DIAGNOSIS — Z7983 Long term (current) use of bisphosphonates: Secondary | ICD-10-CM

## 2021-03-13 DIAGNOSIS — E861 Hypovolemia: Secondary | ICD-10-CM | POA: Diagnosis not present

## 2021-03-13 DIAGNOSIS — F0390 Unspecified dementia without behavioral disturbance: Secondary | ICD-10-CM | POA: Diagnosis not present

## 2021-03-13 DIAGNOSIS — M545 Low back pain, unspecified: Secondary | ICD-10-CM | POA: Diagnosis not present

## 2021-03-13 DIAGNOSIS — R7989 Other specified abnormal findings of blood chemistry: Secondary | ICD-10-CM | POA: Diagnosis not present

## 2021-03-13 DIAGNOSIS — R778 Other specified abnormalities of plasma proteins: Secondary | ICD-10-CM | POA: Diagnosis not present

## 2021-03-13 DIAGNOSIS — R627 Adult failure to thrive: Secondary | ICD-10-CM | POA: Diagnosis present

## 2021-03-13 DIAGNOSIS — R0789 Other chest pain: Secondary | ICD-10-CM | POA: Diagnosis not present

## 2021-03-13 DIAGNOSIS — R531 Weakness: Secondary | ICD-10-CM | POA: Diagnosis present

## 2021-03-13 DIAGNOSIS — Z7901 Long term (current) use of anticoagulants: Secondary | ICD-10-CM | POA: Diagnosis not present

## 2021-03-13 DIAGNOSIS — Z8673 Personal history of transient ischemic attack (TIA), and cerebral infarction without residual deficits: Secondary | ICD-10-CM

## 2021-03-13 DIAGNOSIS — Z888 Allergy status to other drugs, medicaments and biological substances status: Secondary | ICD-10-CM

## 2021-03-13 LAB — CBC WITH DIFFERENTIAL/PLATELET
Abs Immature Granulocytes: 0.02 10*3/uL (ref 0.00–0.07)
Basophils Absolute: 0 10*3/uL (ref 0.0–0.1)
Basophils Relative: 1 %
Eosinophils Absolute: 0 10*3/uL (ref 0.0–0.5)
Eosinophils Relative: 0 %
HCT: 34.8 % — ABNORMAL LOW (ref 36.0–46.0)
Hemoglobin: 11.6 g/dL — ABNORMAL LOW (ref 12.0–15.0)
Immature Granulocytes: 0 %
Lymphocytes Relative: 17 %
Lymphs Abs: 1 10*3/uL (ref 0.7–4.0)
MCH: 30 pg (ref 26.0–34.0)
MCHC: 33.3 g/dL (ref 30.0–36.0)
MCV: 89.9 fL (ref 80.0–100.0)
Monocytes Absolute: 0.8 10*3/uL (ref 0.1–1.0)
Monocytes Relative: 14 %
Neutro Abs: 4 10*3/uL (ref 1.7–7.7)
Neutrophils Relative %: 68 %
Platelets: 146 10*3/uL — ABNORMAL LOW (ref 150–400)
RBC: 3.87 MIL/uL (ref 3.87–5.11)
RDW: 15.7 % — ABNORMAL HIGH (ref 11.5–15.5)
WBC: 5.9 10*3/uL (ref 4.0–10.5)
nRBC: 0 % (ref 0.0–0.2)

## 2021-03-13 LAB — APTT: aPTT: 42 seconds — ABNORMAL HIGH (ref 24–36)

## 2021-03-13 LAB — COMPREHENSIVE METABOLIC PANEL
ALT: 44 U/L (ref 0–44)
AST: 40 U/L (ref 15–41)
Albumin: 4.1 g/dL (ref 3.5–5.0)
Alkaline Phosphatase: 53 U/L (ref 38–126)
Anion gap: 13 (ref 5–15)
BUN: 14 mg/dL (ref 8–23)
CO2: 21 mmol/L — ABNORMAL LOW (ref 22–32)
Calcium: 9.2 mg/dL (ref 8.9–10.3)
Chloride: 104 mmol/L (ref 98–111)
Creatinine, Ser: 1.28 mg/dL — ABNORMAL HIGH (ref 0.44–1.00)
GFR, Estimated: 41 mL/min — ABNORMAL LOW (ref >60)
Glucose, Bld: 87 mg/dL (ref 70–99)
Potassium: 3.6 mmol/L (ref 3.5–5.1)
Sodium: 138 mmol/L (ref 135–145)
Total Bilirubin: 0.5 mg/dL (ref 0.3–1.2)
Total Protein: 6.7 g/dL (ref 6.5–8.1)

## 2021-03-13 LAB — HEPARIN LEVEL (UNFRACTIONATED): Heparin Unfractionated: >1.1 IU/mL — ABNORMAL HIGH (ref 0.30–0.70)

## 2021-03-13 LAB — RESP PANEL BY RT-PCR (FLU A&B, COVID) ARPGX2
Influenza A by PCR: NEGATIVE
Influenza B by PCR: NEGATIVE
SARS Coronavirus 2 by RT PCR: NEGATIVE

## 2021-03-13 LAB — PROTIME-INR
INR: 2 — ABNORMAL HIGH (ref 0.8–1.2)
Prothrombin Time: 22.3 seconds — ABNORMAL HIGH (ref 11.4–15.2)

## 2021-03-13 LAB — TROPONIN I (HIGH SENSITIVITY)
Troponin I (High Sensitivity): 857 ng/L (ref <18)
Troponin I (High Sensitivity): 834 ng/L (ref <18)

## 2021-03-13 MED ORDER — VITAMIN B-12 1000 MCG PO TABS
1000.0000 ug | ORAL_TABLET | Freq: Every day | ORAL | Status: DC
  Administered 2021-03-14 – 2021-03-15 (×2): 1000 ug via ORAL
  Filled 2021-03-13 (×2): qty 1

## 2021-03-13 MED ORDER — ROSUVASTATIN CALCIUM 5 MG PO TABS: 10.0000 mg | ORAL_TABLET | Freq: Every day | ORAL | Status: DC

## 2021-03-13 MED ORDER — NITROGLYCERIN 0.4 MG SL SUBL: 0.4000 mg | SUBLINGUAL_TABLET | SUBLINGUAL | Status: DC | PRN

## 2021-03-13 MED ORDER — ACETAMINOPHEN 325 MG PO TABS
650.0000 mg | ORAL_TABLET | ORAL | Status: DC | PRN
  Administered 2021-03-14 (×2): 650 mg via ORAL
  Filled 2021-03-13 (×2): qty 2

## 2021-03-13 MED ORDER — MAGNESIUM HYDROXIDE 400 MG/5ML PO SUSP
30.0000 mL | Freq: Every day | ORAL | Status: DC | PRN
Start: 2021-03-13 — End: 2021-03-15

## 2021-03-13 MED ORDER — MIRABEGRON ER 50 MG PO TB24
50.0000 mg | ORAL_TABLET | Freq: Every day | ORAL | Status: DC
  Administered 2021-03-14 – 2021-03-15 (×2): 50 mg via ORAL
  Filled 2021-03-13 (×2): qty 1

## 2021-03-13 MED ORDER — VITAMIN D 25 MCG (1000 UNIT) PO TABS
1000.0000 [IU] | ORAL_TABLET | Freq: Every day | ORAL | Status: DC
  Administered 2021-03-14 – 2021-03-15 (×2): 1000 [IU] via ORAL
  Filled 2021-03-13 (×2): qty 1

## 2021-03-13 MED ORDER — LOSARTAN POTASSIUM 50 MG PO TABS
50.0000 mg | ORAL_TABLET | Freq: Every day | ORAL | Status: DC
Start: 2021-03-14 — End: 2021-03-15
  Administered 2021-03-14 – 2021-03-15 (×2): 50 mg via ORAL
  Filled 2021-03-13 (×2): qty 1

## 2021-03-13 MED ORDER — TRAZODONE HCL 50 MG PO TABS
25.0000 mg | ORAL_TABLET | Freq: Every evening | ORAL | Status: DC | PRN
  Administered 2021-03-14: 25 mg via ORAL
  Filled 2021-03-13: qty 1

## 2021-03-13 MED ORDER — MEMANTINE HCL 5 MG PO TABS
5.0000 mg | ORAL_TABLET | Freq: Every day | ORAL | Status: DC
  Administered 2021-03-14 – 2021-03-15 (×2): 5 mg via ORAL
  Filled 2021-03-13 (×2): qty 1

## 2021-03-13 MED ORDER — ASPIRIN 81 MG PO CHEW
324.0000 mg | CHEWABLE_TABLET | ORAL | Status: AC
  Administered 2021-03-14: 324 mg via ORAL
  Filled 2021-03-13: qty 4

## 2021-03-13 MED ORDER — DONEPEZIL HCL 10 MG PO TABS
10.0000 mg | ORAL_TABLET | Freq: Every day | ORAL | Status: DC
  Administered 2021-03-14 – 2021-03-15 (×2): 10 mg via ORAL
  Filled 2021-03-13 (×2): qty 1

## 2021-03-13 MED ORDER — ONDANSETRON HCL 4 MG/2ML IJ SOLN: 4.0000 mg | Freq: Four times a day (QID) | INTRAMUSCULAR | Status: DC | PRN

## 2021-03-13 MED ORDER — MORPHINE SULFATE (PF) 2 MG/ML IV SOLN: 2.0000 mg | INTRAVENOUS | Status: DC | PRN

## 2021-03-13 MED ORDER — HEPARIN (PORCINE) 25000 UT/250ML-% IV SOLN
750.0000 [IU]/h | INTRAVENOUS | Status: DC
  Administered 2021-03-13: 21:00:00 750 [IU]/h via INTRAVENOUS
  Filled 2021-03-13: qty 250

## 2021-03-13 MED ORDER — ALPRAZOLAM 0.25 MG PO TABS
0.2500 mg | ORAL_TABLET | Freq: Two times a day (BID) | ORAL | Status: DC | PRN
  Administered 2021-03-14: 0.25 mg via ORAL
  Filled 2021-03-13: qty 1

## 2021-03-13 MED ORDER — PANTOPRAZOLE SODIUM 40 MG PO TBEC
40.0000 mg | DELAYED_RELEASE_TABLET | Freq: Every morning | ORAL | Status: DC
  Administered 2021-03-14 – 2021-03-15 (×2): 40 mg via ORAL
  Filled 2021-03-13 (×2): qty 1

## 2021-03-13 MED ORDER — ASPIRIN EC 81 MG PO TBEC
81.0000 mg | DELAYED_RELEASE_TABLET | Freq: Every day | ORAL | Status: DC
  Administered 2021-03-14: 81 mg via ORAL
  Filled 2021-03-13: qty 1

## 2021-03-13 MED ORDER — ASPIRIN 300 MG RE SUPP
300.0000 mg | RECTAL | Status: AC
  Filled 2021-03-13: qty 1

## 2021-03-13 MED ORDER — METOPROLOL TARTRATE 12.5 MG HALF TABLET
12.5000 mg | ORAL_TABLET | Freq: Two times a day (BID) | ORAL | Status: DC
  Administered 2021-03-14 – 2021-03-15 (×4): 12.5 mg via ORAL
  Filled 2021-03-13 (×4): qty 1

## 2021-03-13 MED ORDER — SODIUM CHLORIDE 0.9 % IV SOLN: INTRAVENOUS | Status: DC

## 2021-03-13 NOTE — Progress Notes (Signed)
ANTICOAGULATION CONSULT NOTE - Initial Consult ? ?Pharmacy Consult for Heparin ?Indication: chest pain/ACS ? ?Allergies  ?Allergen Reactions  ? Atorvastatin   ?  Other reaction(s): hot flashes; felt like walking/leaning to L side; constipation  ? Citalopram   ?  Other reaction(s): felt it made the inside and outside of her skin burn.it is not clear if this is an allergy or just an adverse reaction of some kind.  ? Duloxetine Hcl   ?  Other reaction(s): she can't tolerate it and wanted it listed as an allergy  ? Ezetimibe   ?  Other reaction(s): "had problems with it"  ? Fluvastatin   ?  Other reaction(s): made legs ache.  ? Gabapentin Other (See Comments)  ?  UNKNOWN  ? Oxybutynin Chloride   ?  Other reaction(s): throat swelling; burning eyelids; nausea  ? Pravastatin   ?  Other reaction(s): "had problems with it"  ? Pregabalin   ?  Other reaction(s): made her feel funny  ? Simvastatin   ?  Other reaction(s): leg pain  ? Zolpidem Tartrate   ?  Other reaction(s): hallucinations.  ? ? ?Patient Measurements: ?Height: '5\' 4"'$  (162.6 cm) ?Weight: 63.5 kg (140 lb) ?IBW/kg (Calculated) : 54.7 ?Heparin Dosing Weight: 63.5 kg ? ?Vital Signs: ?Temp: 99.2 ?F (37.3 ?C) (03/09 1620) ?Temp Source: Oral (03/09 1620) ?BP: 116/70 (03/09 1900) ?Pulse Rate: 81 (03/09 1900) ? ?Labs: ?Recent Labs  ?  03/13/21 ?1643  ?HGB 11.6*  ?HCT 34.8*  ?PLT 146*  ?CREATININE 1.28*  ?TROPONINIHS 857*  ? ? ?Estimated Creatinine Clearance: 27.2 mL/min (A) (by C-G formula based on SCr of 1.28 mg/dL (H)). ? ? ?Medical History: ?Past Medical History:  ?Diagnosis Date  ? AR (allergic rhinitis)   ? Arthritis   ? Bilateral carotid artery stenosis   ? MILD --  40% BILATERAL ICA PER DOPPLER JULY 2014  ? Diverticulosis   ? Frequency of urination   ? GERD (gastroesophageal reflux disease)   ? History of colon polyps   ? ADENOMATOUS  ? History of Helicobacter pylori infection   ? History of shingles   ? History of TIA (transient ischemic attack)   ? Hyperlipidemia    ? Hypertension   ? Impaired memory   ? Interstitial cystitis   ? Iron deficiency   ? Nocturia   ? Osteopenia   ? PAF (paroxysmal atrial fibrillation) ([place])   ? dx 07/ 2015  ? Pelvic pain in female   ? Urgency of urination   ? Vitamin D deficiency   ? Wears glasses   ? ? ?Medications:  ?(Not in a hospital admission) ? ?Scheduled:  ?Infusions:  ?PRN:  ? ?Assessment: ?68 yof presenting with falls. Heparin per pharmacy consult placed for chest pain/ACS. ? ?Patient is on xarelto prior to arrival. Last dose at 0830 on 3/9. Will require aPTT monitoring due to likely falsely high anti-Xa level secondary to DOAC use. ? ?Hgb 11.6; plt 146 ? ?Goal of Therapy:  ?Heparin level 0.3-0.7 units/ml ?aPTT 66-102 seconds ?Monitor platelets by anticoagulation protocol: Yes ?  ?Plan:  ?No initial heparin bolus ?Start heparin infusion at 750 units/hr ?Check aPTT & anti-Xa level in 8 hours and daily while on heparin ?Continue to monitor via aPTT until levels are correlated ?Continue to monitor H&H and platelets ? ?Lorelei Pont, PharmD, BCPS ?03/13/2021 7:16 PM ?ED Clinical Pharmacist -  [phone] ? ?  ?

## 2021-03-13 NOTE — ED Notes (Signed)
Patient transported to CT 

## 2021-03-13 NOTE — ED Triage Notes (Signed)
Pt arrives to ED with c/o falls. Per daughter pt has had two falls over the past three days. Pt states that she is having buttocks, bilateral rib, and lower back pain.  ?

## 2021-03-13 NOTE — ED Provider Notes (Signed)
[facility] EMERGENCY DEPT Provider Note   CSN: [id number] Arrival date & time: 03/13/21  1608     History  Chief Complaint  Patient presents with   Fall   Weakness    Christine [name] is a 88 y.o. female w/ hx of dementia presenting from home with falls at home.  Supplemental hx provided by daughter.  Daughter reports the patient lives semiindependently with family close by across [place].  She has normally able to get around with her walker without difficulty.  She does have progressively worsening dementia, has a baseline tremor, the family was concerned the patient had 2 falls earlier this week.  She denies any fevers, chills, cough, respiratory symptoms or diarrhea.  Patient himself is a poor historian and cannot provide further information.  She does complain of pain in her lower back.  She has a history of a left hip replacement  HPI     Home Medications Prior to Admission medications   Medication Sig Start Date End Date Taking? Authorizing Provider  acetaminophen (TYLENOL) 325 MG tablet 1-2 tabs po prn 02/27/09   [provider]  alendronate (FOSAMAX) 70 MG tablet Take 1 tablet by mouth every 7 (seven) days. 10/20/15   [provider]  amoxicillin (AMOXIL) 500 MG capsule Take 4 capsules 1 hour prior to dental cleaning/procedure. 07/09/16   Meredith Pel, MD  cefdinir (OMNICEF) 300 MG capsule  02/14/18   [provider]  Cholecalciferol (VITAMIN D3) 1000 UNITS CAPS Take 1,000 Units by mouth daily.     [provider]  COVID-19 mRNA vaccine, Moderna, 100 MCG/0.5ML injection USE AS DIRECTED 04/03/20 04/03/21  Carlyle Basques, MD  Cyanocobalamin (B-12 PO) Take 1 tablet by mouth daily. 08/04/11   [provider]  docusate sodium (STOOL SOFTENER) 100 MG capsule take 1 po bid prn 03/26/09   [provider]  donepezil (ARICEPT) 10 MG tablet Take 10 mg by mouth daily. 08/29/20   [provider]  FLUAD  QUADRIVALENT 0.5 ML injection  09/08/18   [provider]  hydrOXYzine (ATARAX/VISTARIL) 25 MG tablet Take 50 mg by mouth at bedtime.  08/27/15   [provider]  imiquimod Leroy Sea) 5 % cream  07/05/19   [provider]  LORazepam (ATIVAN) 1 MG tablet Take 1-2 mg by mouth at bedtime. 12/23/17   [provider]  losartan (COZAAR) 50 MG tablet Take 50 mg by mouth every morning.     [provider]  losartan (COZAAR) 50 MG tablet Take 1 tablet by mouth daily.    [provider]  memantine (NAMENDA) 5 MG tablet  08/10/19   [provider]  memantine (NAMENDA) 5 MG tablet 1 tablet    [provider]  Meth-Hyo-M Bl-Na Phos-Ph Sal (URIBEL) 118 MG CAPS prn . has a med like this from urology 07/26/17   [provider]  Methen-Hyosc-Meth Blue-Na Phos (ME/NAPHOS/MB/HYO1) 81.6 MG TABS TAKE 1 TABLET BY MOUTH EVERY 6 HOURS AS NEEDED FOR BURNING 03/21/17   [provider]  mirabegron ER (MYRBETRIQ) 50 MG TB24 tablet Take 50 mg by mouth daily. 12/25/14   [provider]  pantoprazole (PROTONIX) 40 MG tablet Take 40 mg by mouth every morning.     [provider]  pentosan polysulfate (ELMIRON) 100 MG capsule Take 200 mg by mouth 2 (two) times daily. 08/27/15   [provider]  phenazopyridine (PYRIDIUM) 100 MG tablet take one tab tid prn bladder 08/04/11   [provider]  Polyethylene Glycol 3350 (MIRALAX PO) daily. 10/07/09   [provider]  PRALUENT 150 MG/ML SOAJ INJECT 1 ML SUBCUTANEOUSLY ONCE EVERY 2 WEEKS 02/11/18   [provider]  Rivaroxaban (XARELTO) 15 MG TABS tablet Take 1 tablet (15 mg total) by mouth daily with supper. 07/17/13   Kinnie Feil, MD  traMADol Veatrice Bourbon) 50 MG tablet  01/31/18   [provider]  vitamin B-12 (CYANOCOBALAMIN) 1000 MCG tablet Take 1,000 mcg by mouth daily.    [provider]  Vitamins A & D (VITAMIN A & D) 10000-400 units  TABS daily. 08/04/11   [provider]      Allergies    Atorvastatin, Citalopram, Duloxetine hcl, Ezetimibe, Fluvastatin, Gabapentin, Oxybutynin chloride, Pravastatin, Pregabalin, Simvastatin, and Zolpidem tartrate    Review of Systems   Review of Systems  Physical Exam Updated Vital Signs BP 116/68 (BP Location: Left Arm)    Pulse 77    Temp 99 F (37.2 C) (Oral)    Resp 17    Ht '5\' 4"'$  (1.626 m)    Wt 63.5 kg    SpO2 99%    BMI 24.03 kg/m  Physical Exam Constitutional:      General: She is not in acute distress.    Comments: Thin, frail  HENT:     Head: Normocephalic and atraumatic.  Eyes:     Conjunctiva/sclera: Conjunctivae normal.     Pupils: Pupils are equal, round, and reactive to light.  Cardiovascular:     Rate and Rhythm: Normal rate and regular rhythm.  Pulmonary:     Effort: Pulmonary effort is normal. No respiratory distress.  Musculoskeletal:     Comments: Lower lumbar midline tenderness and sacral tailbone tenderness Full range of motion of the bilateral hips with no tenderness, no deformity of the hip  Skin:    General: Skin is warm and dry.  Neurological:     General: No focal deficit present.     Mental Status: She is alert.    ED Results / Procedures / Treatments   Labs (all labs ordered are listed, but only abnormal results are displayed) Labs Reviewed  COMPREHENSIVE METABOLIC PANEL - Abnormal; Notable for the following components:      Result Value   CO2 21 (*)    Creatinine, Ser 1.28 (*)    GFR, Estimated 41 (*)    All other components within normal limits  CBC WITH DIFFERENTIAL/PLATELET - Abnormal; Notable for the following components:   Hemoglobin 11.6 (*)    HCT 34.8 (*)    RDW 15.7 (*)    Platelets 146 (*)    All other components within normal limits  APTT - Abnormal; Notable for the following components:   aPTT 42 (*)    All other components within normal limits  HEPARIN LEVEL (UNFRACTIONATED) - Abnormal; Notable for the  following components:   Heparin Unfractionated >1.10 (*)    All other components within normal limits  PROTIME-INR - Abnormal; Notable for the following components:   Prothrombin Time 22.3 (*)    INR 2.0 (*)    All other components within normal limits  TROPONIN I (HIGH SENSITIVITY) - Abnormal; Notable for the following components:   Troponin I (High Sensitivity) 857 (*)    All other components within normal limits  TROPONIN I (HIGH SENSITIVITY) - Abnormal; Notable for the following components:   Troponin I (High Sensitivity) 834 (*)    All other components within normal limits  RESP PANEL  BY RT-PCR (FLU A&B, COVID) ARPGX2  URINALYSIS, ROUTINE W REFLEX MICROSCOPIC  APTT  HEPARIN LEVEL (UNFRACTIONATED)  CBC    EKG EKG Interpretation  Date/Time:  Thursday March 13 2021 16:22:45 EST Ventricular Rate:  78 PR Interval:  198 QRS Duration: 86 QT Interval:  416 QTC Calculation: 474 R Axis:   -8 Text Interpretation: Sinus rhythm No STEMI Borderline low voltage, extremity leads Confirmed by Octaviano Glow [phone]) on 03/13/2021 4:26:55 PM  Radiology CT Head Wo Contrast  Result Date: 03/13/2021 CLINICAL DATA:  Dementia, worsening confusion, falls EXAM: CT HEAD WITHOUT CONTRAST TECHNIQUE: Contiguous axial images were obtained from the base of the skull through the vertex without intravenous contrast. RADIATION DOSE REDUCTION: This exam was performed according to the departmental dose-optimization program which includes automated exposure control, adjustment of the mA and/or kV according to patient size and/or use of iterative reconstruction technique. COMPARISON:  05/27/2016. FINDINGS: Brain: No evidence of acute infarction, hemorrhage, cerebral edema, mass, mass effect, or midline shift. No hydrocephalus or extra-axial fluid collection. Periventricular white matter changes, likely the sequela of chronic small vessel ischemic disease. Degree of cerebral atrophy is within normal limits for age and  without lobar predominance Vascular: No hyperdense vessel. Skull: Normal. Negative for fracture or focal lesion. Sinuses/Orbits: No acute finding. Status post bilateral lens replacements. Other: Fluid in left mastoid tip. IMPRESSION: IMPRESSION No acute intracranial process. Electronically Signed   By: Merilyn Baba M.D.   On: 03/13/2021 17:42   CT Lumbar Spine Wo Contrast  Result Date: 03/13/2021 CLINICAL DATA:  Fall with back pain EXAM: CT LUMBAR SPINE WITHOUT CONTRAST TECHNIQUE: Multidetector CT imaging of the lumbar spine was performed without intravenous contrast administration. Multiplanar CT image reconstructions were also generated. RADIATION DOSE REDUCTION: This exam was performed according to the departmental dose-optimization program which includes automated exposure control, adjustment of the mA and/or kV according to patient size and/or use of iterative reconstruction technique. COMPARISON:  MRI 08/12/2015 FINDINGS: Segmentation: 5 lumbar type vertebrae. Alignment: Normal. Vertebrae: No acute fracture or focal pathologic process. Paraspinal and other soft tissues: Aortic atherosclerosis. Probable parapelvic renal cysts. No paravertebral or paraspinal soft tissue abnormality Disc levels: Mild diffuse disc bulge at L3-L4 with mild canal stenosis. Facet degenerative changes. No foraminal narrowing. Marked disc space narrowing at L4-L5. Diffuse disc bulge. Mild canal stenosis. Moderate facet degenerative changes with mild bilateral foraminal narrowing. No significant canal stenosis at L5-S1. Facet degenerative changes without significant foraminal narrowing. IMPRESSION: 1. No acute osseous abnormality. 2. Multilevel degenerative changes without high-grade canal stenosis. Electronically Signed   By: Donavan Foil M.D.   On: 03/13/2021 17:54   DG Chest Portable 1 View  Result Date: 03/13/2021 CLINICAL DATA:  Chest discomfort EXAM: PORTABLE CHEST 1 VIEW COMPARISON:  09/30/2015, 05/28/2016 FINDINGS: The  heart size and mediastinal contours are within normal limits. Both lungs are clear. The visualized skeletal structures are unremarkable. Mild scarring or atelectasis at the bases. Probable skin fold artifact over the left upper thorax IMPRESSION: Probable skin fold artifact over the left upper thorax. Mild scarring or atelectasis at the bases Electronically Signed   By: Donavan Foil M.D.   On: 03/13/2021 17:16    Procedures .Critical Care Performed by: Wyvonnia Dusky, MD Authorized by: Wyvonnia Dusky, MD   Critical care provider statement:    Critical care time (minutes):  45   Critical care time was exclusive of:  Separately billable procedures and treating other patients   Critical care was necessary to treat  or prevent imminent or life-threatening deterioration of the following conditions:  Circulatory failure   Critical care was time spent personally by me on the following activities:  Ordering and performing treatments and interventions, ordering and review of laboratory studies, ordering and review of radiographic studies, pulse oximetry, review of old charts, examination of patient and evaluation of patient's response to treatment Comments:     IV heparin for NSTEMI    Medications Ordered in ED Medications  heparin ADULT infusion 100 units/mL (25000 units/267m) (750 Units/hr Intravenous New Bag/Given 03/13/21 2044)    ED Course/ Medical Decision Making/ A&P Clinical Course as of 03/13/21 2330  Thu Mar 13, 2021  1755 Troponin I (High Sensitivity)(!!): 857 [MT]  1755 Patient's initial EKG showed some very minor ST elevations in lead I and aVL, which did not meet STEMI criteria.  However I will consult with cardiology as I anticipate she will likely require medical admission [MT]  1756 Pt reassessed - still no chest pain or pressure [MT]  1Valatieplaced page to Dr KMerrilee Janskyoffice - pt seen by Dr KDoylene Canardin 2015 for cardiology eval [MT]  1849 Dr HTerrence Dupont(with [phone] practice) advised that I contact heartcare/STEMI physician for admission, as patient was seen only once by their practice and 8 years ago, does not follow with them otherwise. [MT]  1908 I spoke to Dr MGlennie Hawkunassigned STEMI doctor, who felt that clinically the patient could be medically managed on heparin with monitoring overnight, serial troponins, no indication for emergent catheterization at this time.  [facility] happy to consult on the patient when she is in the hospital [MT]  2045 Admitted to hospitalist on heparin, delta trop flat [MT]    Clinical Course User Index [MT] Alfio Loescher, MCarola Rhine MD                           Medical Decision Making Amount and/or Complexity of Data Reviewed Labs: ordered. Decision-making details documented in ED Course. Radiology: ordered.  Risk Prescription drug management. Decision regarding hospitalization.   This patient presents to the ED with concern for 2 falls this week, generalized weakness.  This involves an extensive number of treatment options, and is a complaint that carries with it a high risk of complications and morbidity.  The differential diagnosis includes progressively worsening dementia versus infection including UTI versus viral syndrome versus anemia versus dehydration versus other  Co-morbidities that complicate the patient evaluation: Dementia limits the patient's reliability as historian  Additional history obtained from patient's daughter at the bedside  I ordered and personally interpreted labs.  The pertinent results include:  troponin 800's -> 800's on repeat; CMP and CBC unremarkable;  I ordered imaging studies including CT of the head and the lumbar spine and x-ray of the chest I independently visualized and interpreted imaging which showed no focal infiltrate I agree with the radiologist interpretation  The patient was maintained on a cardiac monitor.  I personally viewed and interpreted the  cardiac monitored which showed an underlying rhythm of: Regular sinus  Per my interpretation the patient's ECG shows normal sinus rhythm without acute ischemic findings  I ordered medication including heparin for NSTEMI felt to be possibly related to coronary disease   Test Considered: Lower suspicion for acute pulmonary embolism with no tachycardia, no hypoxia, no complaint of chest pain.  I do not believe an emergent CT PE study was indicated at this time.  I requested consultation with the cardiology,  as noted in ED course.  After the interventions noted above, I reevaluated the patient and found that they have: stayed the same  Dispostion:  After consideration of the diagnostic results and the patients response to treatment, I feel that the patent would benefit from medical admission and cardiology consultation.         Final Clinical Impression(s) / ED Diagnoses Final diagnoses:  NSTEMI (non-ST elevated myocardial infarction) [facility])    Rx / DC Orders ED Discharge Orders     None         Wyvonnia Dusky, MD 03/13/21 2330

## 2021-03-13 NOTE — ED Notes (Signed)
Pt has IV RAC, pt forgetful and continues to bend arm. Second IV placed to right hand, heparin infusing through new IV. Family remains at bedside. Assigned EMT updated ?

## 2021-03-13 NOTE — H&P (Addendum)
[place]   PATIENT NAME: Christine [name]    MR#:  [id number]  DATE OF BIRTH:  [birth year]  DATE OF ADMISSION:  03/13/2021  PRIMARY CARE PHYSICIAN: Rodrigo Ran, MD   Patient is coming from: Home.  REQUESTING/REFERRING PHYSICIAN: [facility]: Terald Sleeper, MD   CHIEF COMPLAINT:   Chief Complaint  Patient presents with   Fall   Weakness    HISTORY OF PRESENT ILLNESS:  Christine [name] is a 88 y.o. female with medical history significant for essential hypertension, dyslipidemia, TIA, GERD, bilateral carotid artery stenosis, osteoarthritis and paroxysmal atrial fibrillation on Xarelto, presented to the emergency room at [facility] with acute onset of generalized fatigue and tiredness with recurrent falls.  Her first fall happened on Monday night when she got out to go to the bathroom and while trying to sit on the toilet she fell on the floor and laid down on the floor until her granddaughter came to help her.  She denied any presyncope or syncope or headache or dizziness or paresthesias or focal muscle weakness then.  She denied any chest pain or palpitations then.  She complained of pain on the left breast after that.  This morning she had another fall out of bed when she slept off and her feet was on the floor and her back was on the bed.  She later had another fall at noon in her kitchen and she crawled to the living room.  During both falls she denies any paresthesias or focal muscle weakness, presyncope or syncope or chest pain or palpitations.  She denies any nausea or vomiting or abdominal pain.  No dysuria, oliguria, urinary frequency or urgency or flank pain.  No cough or wheezing or hemoptysis.  No leg pain or edema or recent travels or surgeries.  She usually ambulates with her walker without difficulty..  After her hip falls that she was complaining of lower back pain.  ED Course: Upon arrival to the ER her temperature was 99.2, respiratory rate was 23 with otherwise normal vital  signs and later temperature was 99 with otherwise normal vital signs.  Labs revealed potassium of 3.6 and creatinine 1.28 above previous levels and CO2 of 21.  High-sensitivity troponin I was 857 and later 834 and CBC showed anemia better than previous levels.  INR was 2 and PT 22.3 with PTT 42 EKG as reviewed by me : EKG showed normal sinus rhythm with a rate of 78 with poor R wave progression. Imaging: Portable chest x-ray showed suspected skinfold artifact over the left upper thorax and mild scarring or atelectasis at the bases.  The patient was given IV heparin.  Dr. Clifton James was notified about the patient.  I also discussed the case with Dr. Cherly Beach who will evaluate her.  She he is admitted to a PCU bed for further evaluation and management.   PAST MEDICAL HISTORY:   Past Medical History:  Diagnosis Date   AR (allergic rhinitis)    Arthritis    Bilateral carotid artery stenosis    MILD --  40% BILATERAL ICA PER DOPPLER JULY 2014   Diverticulosis    Frequency of urination    GERD (gastroesophageal reflux disease)    History of colon polyps    ADENOMATOUS   History of Helicobacter pylori infection    History of shingles    History of TIA (transient ischemic attack)    Hyperlipidemia    Hypertension    Impaired memory  Interstitial cystitis    Iron deficiency    Nocturia    Osteopenia    PAF (paroxysmal atrial fibrillation) (HCC)    dx 07/ 2015   Pelvic pain in female    Urgency of urination    Vitamin D deficiency    Wears glasses     PAST SURGICAL HISTORY:   Past Surgical History:  Procedure Laterality Date   ABDOMINAL HYSTERECTOMY     CATARACT EXTRACTION W/ INTRAOCULAR LENS  IMPLANT, BILATERAL  2009   CYSTO WITH HYDRODISTENSION  02/24/2011   Procedure: CYSTOSCOPY/HYDRODISTENSION;  Surgeon: Martina Sinner, MD;  Location: [facility];  Service: Urology;  Laterality: N/A;  INSTILLATION OF marcaine and  pyridium   CYSTO WITH HYDRODISTENSION N/A  06/20/2013   Procedure: CYSTOSCOPY/HYDRODISTENSION/INSTALLATION OF PYRIDIUM -MARCAINE 0.5%.;  Surgeon: Martina Sinner, MD;  Location: [facility] [place];  Service: Urology;  Laterality: N/A;   CYSTO WITH HYDRODISTENSION N/A 03/22/2014   Procedure: CYSTOSCOPY/HYDRODISTENSION INSTILLATION OF MARCAINE AND PYRDIUM ;  Surgeon: Alfredo Martinez, MD;  Location: [facility] [place];  Service: Urology;  Laterality: N/A;   CYSTO WITH HYDRODISTENSION N/A 09/17/2014   Procedure: CYSTOSCOPY HYDRODISTENSION INSTILLATION OF MARCAINE AND PYRIDIUM;  Surgeon: Alfredo Martinez, MD;  Location: [facility];  Service: Urology;  Laterality: N/A;   CYSTO/ HOD/ INSTILLATION THERAPY  10-04-2008  &  05-02-2009   CYSTO/ LEFT URETEROSCOPY W/ LEFT URETERAL DILATION  04-15-2006   TOTAL HIP ARTHROPLASTY Left 05/29/2016   Procedure: TOTAL HIP ARTHROPLASTY ANTERIOR APPROACH;  Surgeon: Cammy Copa, MD;  Location: [facility];  Service: Orthopedics;  Laterality: Left;   TRANSTHORACIC ECHOCARDIOGRAM  07-17-2013   grade I diastolic dysfuntion/  ef 60-65%/  mild AR and MR/  trivial TR   URETEROLITHOTOMY Bilateral 11-24-2004 LEFT  &  05-29-2006  RIGHT   VAULT SUSPENSION WITH GRAFT/ CYSTOCELE REPAIR  09-17-2009    SOCIAL HISTORY:   Social History   Tobacco Use   Smoking status: Never   Smokeless tobacco: Never  Substance Use Topics   Alcohol use: No    FAMILY HISTORY:  History reviewed. No pertinent family history.  DRUG ALLERGIES:   Allergies  Allergen Reactions   Atorvastatin     Other reaction(s): hot flashes; felt like walking/leaning to L side; constipation   Citalopram     Other reaction(s): felt it made the inside and outside of her skin burn.it is not clear if this is an allergy or just an adverse reaction of some kind.   Duloxetine Hcl     Other reaction(s): she can't tolerate it and wanted it listed as an allergy   Ezetimibe     Other reaction(s): "had problems with it"    Fluvastatin     Other reaction(s): made legs ache.   Gabapentin Other (See Comments)    UNKNOWN   Oxybutynin Chloride     Other reaction(s): throat swelling; burning eyelids; nausea   Pravastatin     Other reaction(s): "had problems with it"   Pregabalin     Other reaction(s): made her feel funny   Simvastatin     Other reaction(s): leg pain   Zolpidem Tartrate     Other reaction(s): hallucinations.    REVIEW OF SYSTEMS:   ROS As per history of present illness. All pertinent systems were reviewed above. Constitutional, HEENT, cardiovascular, respiratory, GI, GU, musculoskeletal, neuro, psychiatric, endocrine, integumentary and hematologic systems were reviewed and are otherwise negative/unremarkable except for positive findings mentioned above in the HPI.  MEDICATIONS AT HOME:   Prior to Admission medications   Medication Sig Start Date End Date Taking? Authorizing Provider  acetaminophen (TYLENOL) 325 MG tablet 1-2 tabs po prn 02/27/09   [provider]  alendronate (FOSAMAX) 70 MG tablet Take 1 tablet by mouth every 7 (seven) days. 10/20/15   [provider]  amoxicillin (AMOXIL) 500 MG capsule Take 4 capsules 1 hour prior to dental cleaning/procedure. 07/09/16   Cammy Copa, MD  cefdinir (OMNICEF) 300 MG capsule  02/14/18   [provider]  Cholecalciferol (VITAMIN D3) 1000 UNITS CAPS Take 1,000 Units by mouth daily.     [provider]  COVID-19 mRNA vaccine, Moderna, 100 MCG/0.5ML injection USE AS DIRECTED 04/03/20 04/03/21  Judyann Munson, MD  Cyanocobalamin (B-12 PO) Take 1 tablet by mouth daily. 08/04/11   [provider]  docusate sodium (STOOL SOFTENER) 100 MG capsule take 1 po bid prn 03/26/09   [provider]  donepezil (ARICEPT) 10 MG tablet Take 10 mg by mouth daily. 08/29/20   [provider]  FLUAD QUADRIVALENT 0.5 ML injection  09/08/18   [provider]  hydrOXYzine (ATARAX/VISTARIL) 25 MG  tablet Take 50 mg by mouth at bedtime.  08/27/15   [provider]  imiquimod Mathis Dad) 5 % cream  07/05/19   [provider]  LORazepam (ATIVAN) 1 MG tablet Take 1-2 mg by mouth at bedtime. 12/23/17   [provider]  losartan (COZAAR) 50 MG tablet Take 50 mg by mouth every morning.     [provider]  losartan (COZAAR) 50 MG tablet Take 1 tablet by mouth daily.    [provider]  memantine (NAMENDA) 5 MG tablet  08/10/19   [provider]  memantine (NAMENDA) 5 MG tablet 1 tablet    [provider]  Meth-Hyo-M Bl-Na Phos-Ph Sal (URIBEL) 118 MG CAPS prn . has a med like this from urology 07/26/17   [provider]  Methen-Hyosc-Meth Blue-Na Phos (ME/NAPHOS/MB/HYO1) 81.6 MG TABS TAKE 1 TABLET BY MOUTH EVERY 6 HOURS AS NEEDED FOR BURNING 03/21/17   [provider]  mirabegron ER (MYRBETRIQ) 50 MG TB24 tablet Take 50 mg by mouth daily. 12/25/14   [provider]  pantoprazole (PROTONIX) 40 MG tablet Take 40 mg by mouth every morning.     [provider]  pentosan polysulfate (ELMIRON) 100 MG capsule Take 200 mg by mouth 2 (two) times daily. 08/27/15   [provider]  phenazopyridine (PYRIDIUM) 100 MG tablet take one tab tid prn bladder 08/04/11   [provider]  Polyethylene Glycol 3350 (MIRALAX PO) daily. 10/07/09   [provider]  PRALUENT 150 MG/ML SOAJ INJECT 1 ML SUBCUTANEOUSLY ONCE EVERY 2 WEEKS 02/11/18   [provider]  Rivaroxaban (XARELTO) 15 MG TABS tablet Take 1 tablet (15 mg total) by mouth daily with supper. 07/17/13   Esperanza Sheets, MD  traMADol Janean Sark) 50 MG tablet  01/31/18   [provider]  vitamin B-12 (CYANOCOBALAMIN) 1000 MCG tablet Take 1,000 mcg by mouth daily.    [provider]  Vitamins A & D (VITAMIN A & D) 10000-400 units TABS daily. 08/04/11   [provider]      VITAL SIGNS:  Blood pressure 116/68, pulse 77,  temperature 99 F (37.2 C), temperature source Oral, resp. rate 17, height 5\' 4"  (1.626 m), weight 63.5 kg, SpO2 99 %.  PHYSICAL EXAMINATION:  Physical Exam  GENERAL:  88 y.o.-year-old Caucasian female  patient lying in the bed with no acute distress.  EYES: Pupils equal, round, reactive to light and accommodation. No scleral icterus. Extraocular muscles intact.  HEENT: Head atraumatic, normocephalic. Oropharynx and nasopharynx clear.  NECK:  Supple, no jugular venous distention. No thyroid enlargement, no tenderness.  LUNGS: Normal breath sounds bilaterally, no wheezing, rales,rhonchi or crepitation. No use of accessory muscles of respiration.  CARDIOVASCULAR: Regular rate and rhythm, S1, S2 normal. No murmurs, rubs, or gallops.  ABDOMEN: Soft, nondistended, nontender. Bowel sounds present. No organomegaly or mass.  EXTREMITIES: No pedal edema, cyanosis, or clubbing.  NEUROLOGIC: Cranial nerves II through XII are intact. Muscle strength 5/5 in all extremities. Sensation intact. Gait not checked.  PSYCHIATRIC: The patient is alert and oriented x 3.  Normal affect and good eye contact. SKIN: No obvious rash, lesion, or ulcer.   LABORATORY PANEL:   CBC Recent Labs  Lab 03/13/21 1643  WBC 5.9  HGB 11.6*  HCT 34.8*  PLT 146*   ------------------------------------------------------------------------------------------------------------------  Chemistries  Recent Labs  Lab 03/13/21 1643  NA 138  K 3.6  CL 104  CO2 21*  GLUCOSE 87  BUN 14  CREATININE 1.28*  CALCIUM 9.2  AST 40  ALT 44  ALKPHOS 53  BILITOT 0.5   ------------------------------------------------------------------------------------------------------------------  Cardiac Enzymes No results for input(s): TROPONINI in the last 168 hours. ------------------------------------------------------------------------------------------------------------------  RADIOLOGY:  CT Head Wo Contrast  Result Date:  03/13/2021 CLINICAL DATA:  Dementia, worsening confusion, falls EXAM: CT HEAD WITHOUT CONTRAST TECHNIQUE: Contiguous axial images were obtained from the base of the skull through the vertex without intravenous contrast. RADIATION DOSE REDUCTION: This exam was performed according to the departmental dose-optimization program which includes automated exposure control, adjustment of the mA and/or kV according to patient size and/or use of iterative reconstruction technique. COMPARISON:  05/27/2016. FINDINGS: Brain: No evidence of acute infarction, hemorrhage, cerebral edema, mass, mass effect, or midline shift. No hydrocephalus or extra-axial fluid collection. Periventricular white matter changes, likely the sequela of chronic small vessel ischemic disease. Degree of cerebral atrophy is within normal limits for age and without lobar predominance Vascular: No hyperdense vessel. Skull: Normal. Negative for fracture or focal lesion. Sinuses/Orbits: No acute finding. Status post bilateral lens replacements. Other: Fluid in left mastoid tip. IMPRESSION: IMPRESSION No acute intracranial process. Electronically Signed   By: Wiliam Ke M.D.   On: 03/13/2021 17:42   CT Lumbar Spine Wo Contrast  Result Date: 03/13/2021 CLINICAL DATA:  Fall with back pain EXAM: CT LUMBAR SPINE WITHOUT CONTRAST TECHNIQUE: Multidetector CT imaging of the lumbar spine was performed without intravenous contrast administration. Multiplanar CT image reconstructions were also generated. RADIATION DOSE REDUCTION: This exam was performed according to the departmental dose-optimization program which includes automated exposure control, adjustment of the mA and/or kV according to patient size and/or use of iterative reconstruction technique. COMPARISON:  MRI 08/12/2015 FINDINGS: Segmentation: 5 lumbar type vertebrae. Alignment: Normal. Vertebrae: No acute fracture or focal pathologic process. Paraspinal and other soft tissues: Aortic atherosclerosis.  Probable parapelvic renal cysts. No paravertebral or paraspinal soft tissue abnormality Disc levels: Mild diffuse disc bulge at L3-L4 with mild canal stenosis. Facet degenerative changes. No foraminal narrowing. Marked disc space narrowing at L4-L5. Diffuse disc bulge. Mild canal stenosis. Moderate facet degenerative changes with mild bilateral foraminal narrowing. No significant canal stenosis at L5-S1. Facet degenerative changes without significant foraminal narrowing. IMPRESSION: 1. No acute osseous abnormality. 2. Multilevel degenerative changes without high-grade canal stenosis. Electronically Signed   By: Jasmine Pang  M.D.   On: 03/13/2021 17:54   DG Chest Portable 1 View  Result Date: 03/13/2021 CLINICAL DATA:  Chest discomfort EXAM: PORTABLE CHEST 1 VIEW COMPARISON:  09/30/2015, 05/28/2016 FINDINGS: The heart size and mediastinal contours are within normal limits. Both lungs are clear. The visualized skeletal structures are unremarkable. Mild scarring or atelectasis at the bases. Probable skin fold artifact over the left upper thorax IMPRESSION: Probable skin fold artifact over the left upper thorax. Mild scarring or atelectasis at the bases Electronically Signed   By: Jasmine Pang M.D.   On: 03/13/2021 17:16      IMPRESSION AND PLAN:  Assessment and Plan: * NSTEMI (non-ST elevated myocardial infarction) [facility]) - The patient is directly admitted to a PCU bed. - She is not having chest pain and she had recurrent falls. - EKG was unremarkable and troponin I has been stable. - I highly suspect acute rhabdomyolysis in her case. - I added CK and CK isoenzymes for further assessment. - Should be hydrated with IV normal saline.  We will follow serial troponins. - I discussed the case with Dr. Cherly Beach who will see the patient. - We will place the patient on aspirin as well as beta-blocker therapy and as needed sublingual nitroglycerin and morphine sulfate if she has any chest pain. - We will  obtain a 2D echo for further assessment.  AKI (acute kidney injury) (HCC) - The patient will be hydrated with IV normal saline and will follow BMP. - We will avoid nephrotoxins. - This could be prerenal due to volume depletion.  Paroxysmal atrial fibrillation (HCC) - The patient will be on IV heparin - We will holding off Xarelto.  Dementia without behavioral disturbance (HCC) - We will continue Aricept and Namenda.  GERD We will continue PPI therapy.    DVT prophylaxis: IV heparin Advanced Care Planning:  Code Status: The patient is DNR/DNI. Family Communication:  The plan of care was discussed in details with the patient (and family). I answered all questions. The patient agreed to proceed with the above mentioned plan. Further management will depend upon hospital course. Disposition Plan: Back to previous home environment Consults called: Cardiology. All the records are reviewed and case discussed with ED provider.  Status is: Inpatient  At the time of the admission, it appears that the appropriate admission status for this patient is inpatient.  This is judged to be reasonable and necessary in order to provide the required intensity of service to ensure the patient's safety given the presenting symptoms, physical exam findings and initial radiographic and laboratory data in the context of comorbid conditions.  The patient requires inpatient status due to high intensity of service, high risk of further deterioration and high frequency of surveillance required.  I certify that at the time of admission, it is my clinical judgment that the patient will require inpatient hospital care extending more than 2 midnights.                            Dispo: The patient is from: Home              Anticipated d/c is to: Home              Patient currently is not medically stable to d/c.              Difficult to place patient: No  Hannah Beat M.D on 03/14/2021 at 2:49 AM  [facility]   From 7 PM-7 AM, contact night-coverage [email or web address]  CC: Primary care physician; Rodrigo Ran, MD

## 2021-03-13 NOTE — ED Notes (Signed)
Called [facility] to transport patient to [facility] 6E room 12 ?

## 2021-03-13 NOTE — Progress Notes (Signed)
Plan of Care Note for accepted transfer ? ? ?Patient: Christine [name] MRN: [id number]   DOA: 03/13/2021 ? ?Facility requesting transfer: [facility] ?Requesting Provider: Dr. Langston Masker ?Reason for transfer: NSTEMI ?Facility course:  ? ?88 year old female with past medical history of hypertension, interstitial cystitis, dementia, paroxysmal atrial fibrillation on Xarelto, gastroesophageal reflux disease who presents to [facility] with complaints of ongoing fatigue and several falls over the past week. ? ?Upon evaluation in the emergency department initial EKG revealed no evidence of STEMI however initial troponins were found to be at 857 followed by 834. ? ?ER provider discussed case with Dr. Virgina Jock with [facility] cardiology [facility] saw patient in the outpatient setting many years ago) and it was determined that the patient is now unassigned.  Regardless, it was felt that the patient likely did not require cardiac catheterization and simply needed medical treatment, particularly with the patient being chest pain-free.   ? ?ER provider then discussed case with Dr. Angelena Form who agreed that patient likely did not require cardiac catheterization. ? ?ER provider requesting hospitalization for suspected NSTEMI.  Since patient is now unassigned, please consult overnight [facility] fellow on arrival. ? ?Plan of care: ?The patient is accepted for admission to Progressive unit, at [facility]..  ? ? ?Author: ?Vernelle Emerald, MD ?03/13/2021 ? ?Check [email or web address] for on-call coverage. ? ?Nursing staff, Please call [place] number on Amion as soon as patient's arrival, so appropriate admitting provider can evaluate the pt. ?

## 2021-03-13 NOTE — ED Notes (Signed)
Pt has been up and down several times trying to urinate. Pt stated she has the urge however she has not been able to produce any urine. Bladder scanner was done and Pt is showing to have 24 ml of urine in her bladder. Pt also stated she felt like she needed to have a BM but was unable to have a BM as well. ?

## 2021-03-14 ENCOUNTER — Inpatient Hospital Stay (HOSPITAL_COMMUNITY): Payer: PPO

## 2021-03-14 ENCOUNTER — Encounter (HOSPITAL_COMMUNITY): Payer: Self-pay | Admitting: Internal Medicine

## 2021-03-14 DIAGNOSIS — I214 Non-ST elevation (NSTEMI) myocardial infarction: Secondary | ICD-10-CM | POA: Diagnosis not present

## 2021-03-14 DIAGNOSIS — I10 Essential (primary) hypertension: Secondary | ICD-10-CM

## 2021-03-14 DIAGNOSIS — E538 Deficiency of other specified B group vitamins: Secondary | ICD-10-CM

## 2021-03-14 DIAGNOSIS — I480 Paroxysmal atrial fibrillation: Secondary | ICD-10-CM

## 2021-03-14 DIAGNOSIS — R778 Other specified abnormalities of plasma proteins: Secondary | ICD-10-CM

## 2021-03-14 DIAGNOSIS — R296 Repeated falls: Secondary | ICD-10-CM

## 2021-03-14 DIAGNOSIS — R7989 Other specified abnormal findings of blood chemistry: Secondary | ICD-10-CM

## 2021-03-14 DIAGNOSIS — E785 Hyperlipidemia, unspecified: Secondary | ICD-10-CM

## 2021-03-14 LAB — CBC
HCT: 32.7 % — ABNORMAL LOW (ref 36.0–46.0)
Hemoglobin: 11.2 g/dL — ABNORMAL LOW (ref 12.0–15.0)
MCH: 30.7 pg (ref 26.0–34.0)
MCHC: 34.3 g/dL (ref 30.0–36.0)
MCV: 89.6 fL (ref 80.0–100.0)
Platelets: 126 10*3/uL — ABNORMAL LOW (ref 150–400)
RBC: 3.65 MIL/uL — ABNORMAL LOW (ref 3.87–5.11)
RDW: 15.6 % — ABNORMAL HIGH (ref 11.5–15.5)
WBC: 4.8 10*3/uL (ref 4.0–10.5)
nRBC: 0 % (ref 0.0–0.2)

## 2021-03-14 LAB — URINALYSIS, ROUTINE W REFLEX MICROSCOPIC
Bilirubin Urine: NEGATIVE
Glucose, UA: NEGATIVE mg/dL
Hgb urine dipstick: NEGATIVE
Ketones, ur: NEGATIVE mg/dL
Leukocytes,Ua: NEGATIVE
Nitrite: NEGATIVE
Protein, ur: NEGATIVE mg/dL
Specific Gravity, Urine: 1.017 (ref 1.005–1.030)
pH: 5 (ref 5.0–8.0)

## 2021-03-14 LAB — ECHOCARDIOGRAM COMPLETE
AR max vel: 1.44 cm2
AV Area VTI: 1.43 cm2
AV Area mean vel: 1.45 cm2
AV Mean grad: 4 mmHg
AV Peak grad: 7.6 mmHg
Ao pk vel: 1.38 m/s
Area-P 1/2: 3.83 cm2
Height: 64 in
P 1/2 time: 634 msec
S' Lateral: 2.3 cm
Weight: 2240 oz

## 2021-03-14 LAB — CK
Total CK: 736 U/L — ABNORMAL HIGH (ref 38–234)
Total CK: 690 U/L — ABNORMAL HIGH (ref 38–234)

## 2021-03-14 LAB — BASIC METABOLIC PANEL
Anion gap: 9 (ref 5–15)
BUN: 11 mg/dL (ref 8–23)
CO2: 22 mmol/L (ref 22–32)
Calcium: 8.5 mg/dL — ABNORMAL LOW (ref 8.9–10.3)
Chloride: 103 mmol/L (ref 98–111)
Creatinine, Ser: 1.22 mg/dL — ABNORMAL HIGH (ref 0.44–1.00)
GFR, Estimated: 43 mL/min — ABNORMAL LOW (ref >60)
Glucose, Bld: 112 mg/dL — ABNORMAL HIGH (ref 70–99)
Potassium: 3.4 mmol/L — ABNORMAL LOW (ref 3.5–5.1)
Sodium: 134 mmol/L — ABNORMAL LOW (ref 135–145)

## 2021-03-14 LAB — LIPID PANEL
Cholesterol: 172 mg/dL (ref 0–200)
HDL: 87 mg/dL (ref >40)
LDL Cholesterol: 75 mg/dL (ref 0–99)
Total CHOL/HDL Ratio: 2 RATIO
Triglycerides: 48 mg/dL (ref <150)
VLDL: 10 mg/dL (ref 0–40)

## 2021-03-14 LAB — APTT: aPTT: 66 seconds — ABNORMAL HIGH (ref 24–36)

## 2021-03-14 LAB — HEPARIN LEVEL (UNFRACTIONATED): Heparin Unfractionated: >1.1 IU/mL — ABNORMAL HIGH (ref 0.30–0.70)

## 2021-03-14 MED ORDER — RIVAROXABAN 15 MG PO TABS
15.0000 mg | ORAL_TABLET | Freq: Every day | ORAL | Status: DC
  Administered 2021-03-14: 15 mg via ORAL
  Filled 2021-03-14: qty 1

## 2021-03-14 MED ORDER — ENSURE ENLIVE PO LIQD
237.0000 mL | Freq: Three times a day (TID) | ORAL | Status: DC
  Administered 2021-03-14 – 2021-03-15 (×2): 237 mL via ORAL

## 2021-03-14 MED ORDER — POTASSIUM CHLORIDE CRYS ER 20 MEQ PO TBCR
30.0000 meq | EXTENDED_RELEASE_TABLET | ORAL | Status: AC
  Administered 2021-03-14 (×2): 30 meq via ORAL
  Filled 2021-03-14 (×2): qty 1

## 2021-03-14 NOTE — Consult Note (Addendum)
Cardiology Consultation:   Patient ID: Christine [name] MRN: [id number]; DOB: [birth year]  Admit date: 03/13/2021 Date of Consult: 03/14/2021  Primary Care Provider: Rodrigo Ran, MD [facility] Cardiologist: None  [facility] Electrophysiologist:  None   Patient Profile:   KAMISHA FINSETH is a 88 y.o. female with dementia, paroxysmal atrial fibrillation, HLD, prior CVA and hypertension who is being seen today for the evaluation of elevated troponin at the request of Dr Arville Care.  History of Present Illness:   Christine [name] is here with her daughter who provides majority of the story secondary to her dementia.  Christine [name] still lives alone however all of her family lives on the same street and frequently comes to her house to check on her.  Her daughter and husband going to the beach this past weekend and Sunday night she was at home alone.  Her granddaughter came to check on her Monday morning around 830 and found her down on the living room floor.  It is unclear how long she was down for overnight and she could not remember exactly what happened but it seems like there was a fall related to her going to the restroom.  She had managed to crawl to the living room to unlock the door to allow her granddaughter to come into the house to check on her.  She did okay throughout the rest of the week however Thursday had a repeat fall and was only down for around an hour while her daughter has not gone to run errands.  She does have history of falls in the past but they are fairly infrequent with a last serious fall recently years ago.  She is maintained on rivaroxaban for stroke risk reduction in setting of known paroxysmal atrial fibrillation and distant prior stroke.  She has had no recent bleeding issues.  She denies any chest pain, pressure, dyspnea on exertion, shortness of breath, lower extremity edema or palpitations.  She knows she is in the hospital and still occasionally drives per her daughter's  report.  Her daughter says that she does not usually know the day but usually knows where she is at, her name, family and a year.  She does not cook for herself anymore but still takes care of other ADLs.  They do not currently have a full-time care provider at the house.  Past Medical History:  Diagnosis Date   AR (allergic rhinitis)    Arthritis    Bilateral carotid artery stenosis    MILD --  40% BILATERAL ICA PER DOPPLER JULY 2014   Diverticulosis    Frequency of urination    GERD (gastroesophageal reflux disease)    History of colon polyps    ADENOMATOUS   History of Helicobacter pylori infection    History of shingles    History of TIA (transient ischemic attack)    Hyperlipidemia    Hypertension    Impaired memory    Interstitial cystitis    Iron deficiency    Nocturia    Osteopenia    PAF (paroxysmal atrial fibrillation) (HCC)    dx 07/ 2015   Pelvic pain in female    Urgency of urination    Vitamin D deficiency    Wears glasses    Past Surgical History:  Procedure Laterality Date   ABDOMINAL HYSTERECTOMY     CATARACT EXTRACTION W/ INTRAOCULAR LENS  IMPLANT, BILATERAL  2009   CYSTO WITH HYDRODISTENSION  02/24/2011   Procedure: CYSTOSCOPY/HYDRODISTENSION;  Surgeon: Jonna Coup  MacDiarmid, MD;  Location: [facility];  Service: Urology;  Laterality: N/A;  INSTILLATION OF marcaine and  pyridium   CYSTO WITH HYDRODISTENSION N/A 06/20/2013   Procedure: CYSTOSCOPY/HYDRODISTENSION/INSTALLATION OF PYRIDIUM -MARCAINE 0.5%.;  Surgeon: Martina Sinner, MD;  Location: [facility];  Service: Urology;  Laterality: N/A;   CYSTO WITH HYDRODISTENSION N/A 03/22/2014   Procedure: CYSTOSCOPY/HYDRODISTENSION INSTILLATION OF MARCAINE AND PYRDIUM ;  Surgeon: Alfredo Martinez, MD;  Location: [facility];  Service: Urology;  Laterality: N/A;   CYSTO WITH HYDRODISTENSION N/A 09/17/2014   Procedure: CYSTOSCOPY HYDRODISTENSION INSTILLATION OF MARCAINE AND  PYRIDIUM;  Surgeon: Alfredo Martinez, MD;  Location: [facility];  Service: Urology;  Laterality: N/A;   CYSTO/ HOD/ INSTILLATION THERAPY  10-04-2008  &  05-02-2009   CYSTO/ LEFT URETEROSCOPY W/ LEFT URETERAL DILATION  04-15-2006   TOTAL HIP ARTHROPLASTY Left 05/29/2016   Procedure: TOTAL HIP ARTHROPLASTY ANTERIOR APPROACH;  Surgeon: Cammy Copa, MD;  Location: [facility];  Service: Orthopedics;  Laterality: Left;   TRANSTHORACIC ECHOCARDIOGRAM  07-17-2013   grade I diastolic dysfuntion/  ef 60-65%/  mild AR and MR/  trivial TR   URETEROLITHOTOMY Bilateral 11-24-2004 LEFT  &  05-29-2006  RIGHT   VAULT SUSPENSION WITH GRAFT/ CYSTOCELE REPAIR  09-17-2009    Home Medications:  Prior to Admission medications   Medication Sig Start Date End Date Taking? Authorizing Provider  acetaminophen (TYLENOL) 325 MG tablet 1-2 tabs po prn 02/27/09   [provider]  alendronate (FOSAMAX) 70 MG tablet Take 1 tablet by mouth every 7 (seven) days. 10/20/15   [provider]  amoxicillin (AMOXIL) 500 MG capsule Take 4 capsules 1 hour prior to dental cleaning/procedure. 07/09/16   Cammy Copa, MD  cefdinir (OMNICEF) 300 MG capsule  02/14/18   [provider]  Cholecalciferol (VITAMIN D3) 1000 UNITS CAPS Take 1,000 Units by mouth daily.     [provider]  COVID-19 mRNA vaccine, Moderna, 100 MCG/0.5ML injection USE AS DIRECTED 04/03/20 04/03/21  Judyann Munson, MD  Cyanocobalamin (B-12 PO) Take 1 tablet by mouth daily. 08/04/11   [provider]  docusate sodium (STOOL SOFTENER) 100 MG capsule take 1 po bid prn 03/26/09   [provider]  donepezil (ARICEPT) 10 MG tablet Take 10 mg by mouth daily. 08/29/20   [provider]  FLUAD QUADRIVALENT 0.5 ML injection  09/08/18   [provider]  hydrOXYzine (ATARAX/VISTARIL) 25 MG tablet Take 50 mg by mouth at bedtime.  08/27/15   [provider]  imiquimod Mathis Dad) 5 % cream   07/05/19   [provider]  LORazepam (ATIVAN) 1 MG tablet Take 1-2 mg by mouth at bedtime. 12/23/17   [provider]  losartan (COZAAR) 50 MG tablet Take 50 mg by mouth every morning.     [provider]  losartan (COZAAR) 50 MG tablet Take 1 tablet by mouth daily.    [provider]  memantine (NAMENDA) 5 MG tablet  08/10/19   [provider]  memantine (NAMENDA) 5 MG tablet 1 tablet    [provider]  Meth-Hyo-M Bl-Na Phos-Ph Sal (URIBEL) 118 MG CAPS prn . has a med like this from urology 07/26/17   [provider]  Methen-Hyosc-Meth Blue-Na Phos (ME/NAPHOS/MB/HYO1) 81.6 MG TABS TAKE 1 TABLET BY MOUTH EVERY 6 HOURS AS NEEDED FOR BURNING 03/21/17   [provider]  mirabegron ER (MYRBETRIQ) 50 MG TB24 tablet Take 50 mg by mouth daily.  12/25/14   [provider]  pantoprazole (PROTONIX) 40 MG tablet Take 40 mg by mouth every morning.     [provider]  pentosan polysulfate (ELMIRON) 100 MG capsule Take 200 mg by mouth 2 (two) times daily. 08/27/15   [provider]  phenazopyridine (PYRIDIUM) 100 MG tablet take one tab tid prn bladder 08/04/11   [provider]  Polyethylene Glycol 3350 (MIRALAX PO) daily. 10/07/09   [provider]  PRALUENT 150 MG/ML SOAJ INJECT 1 ML SUBCUTANEOUSLY ONCE EVERY 2 WEEKS 02/11/18   [provider]  Rivaroxaban (XARELTO) 15 MG TABS tablet Take 1 tablet (15 mg total) by mouth daily with supper. 07/17/13   Esperanza Sheets, MD  traMADol Janean Sark) 50 MG tablet  01/31/18   [provider]  vitamin B-12 (CYANOCOBALAMIN) 1000 MCG tablet Take 1,000 mcg by mouth daily.    [provider]  Vitamins A & D (VITAMIN A & D) 10000-400 units TABS daily. 08/04/11   [provider]    Inpatient Medications: Scheduled Meds:  aspirin EC  81 mg Oral Daily   cholecalciferol  1,000 Units Oral Daily   donepezil  10 mg Oral Daily   losartan   50 mg Oral Daily   memantine  5 mg Oral Daily   metoprolol tartrate  12.5 mg Oral BID   mirabegron ER  50 mg Oral Daily   pantoprazole  40 mg Oral q morning   vitamin B-12  1,000 mcg Oral Daily   Continuous Infusions:  sodium chloride 100 mL/hr at 03/14/21 0015   heparin 750 Units/hr (03/13/21 2044)   PRN Meds: acetaminophen, ALPRAZolam, magnesium hydroxide, morphine injection, nitroGLYCERIN, ondansetron (ZOFRAN) IV, traZODone  Allergies:    Allergies  Allergen Reactions   Atorvastatin     Other reaction(s): hot flashes; felt like walking/leaning to L side; constipation   Citalopram     Other reaction(s): felt it made the inside and outside of her skin burn.it is not clear if this is an allergy or just an adverse reaction of some kind.   Duloxetine Hcl     Other reaction(s): she can't tolerate it and wanted it listed as an allergy   Ezetimibe     Other reaction(s): "had problems with it"   Fluvastatin     Other reaction(s): made legs ache.   Gabapentin Other (See Comments)    UNKNOWN   Oxybutynin Chloride     Other reaction(s): throat swelling; burning eyelids; nausea   Pravastatin     Other reaction(s): "had problems with it"   Pregabalin     Other reaction(s): made her feel funny   Simvastatin     Other reaction(s): leg pain   Zolpidem Tartrate     Other reaction(s): hallucinations.    Social History:   Social History   Socioeconomic History   Marital status: Widowed    Spouse name: Not on file   Number of children: Not on file   Years of education: Not on file   Highest education level: Not on file  Occupational History   Not on file  Tobacco Use   Smoking status: Never   Smokeless tobacco: Never  Substance and Sexual Activity   Alcohol use: No   Drug use: No   Sexual activity: Not on file  Other Topics Concern   Not on file  Social History Narrative   Not on file   Social Determinants of Health   Financial Resource Strain: Not on file  Food  Insecurity: Not on file  Transportation Needs: Not on file  Physical Activity: Not on file  Stress: Not on file  Social Connections: Not on file  Intimate Partner Violence: Not on file    Family History:   History reviewed. No pertinent family history.   ROS:  Review of Systems: [y] = yes, [ ]  = no      General: Weight gain [ ] ; Weight loss [ ] ; Anorexia [ ] ; Fatigue [ ] ; Fever [ ] ; Chills [ ] ; Weakness [ ]    Cardiac: Chest pain/pressure [ ] ; Resting SOB [ ] ; Exertional SOB [ ] ; Orthopnea [ ] ; Pedal Edema [ ] ; Palpitations [ ] ; Syncope [ ] ; Presyncope [ ] ; Paroxysmal nocturnal dyspnea [ ]    Pulmonary: Cough [ ] ; Wheezing [ ] ; Hemoptysis [ ] ; Sputum [ ] ; Snoring [ ]    GI: Vomiting [ ] ; Dysphagia [ ] ; Melena [ ] ; Hematochezia [ ] ; Heartburn [ ] ; Abdominal pain [ ] ; Constipation [ ] ; Diarrhea [ ] ; BRBPR [ ]    GU: Hematuria [ ] ; Dysuria [ ] ; Nocturia [ ]  Vascular: Pain in legs with walking [ ] ; Pain in feet with lying flat [ ] ; Non-healing sores [ ] ; Stroke [ ] ; TIA [ ] ; Slurred speech [ ] ;   Neuro: Headaches [ ] ; Vertigo [ ] ; Seizures [ ] ; Paresthesias [ ] ;Blurred vision [ ] ; Diplopia [ ] ; Vision changes [ ]    Ortho/Skin: Arthritis [ ] ; Joint pain [ ] ; Muscle pain [ ] ; Joint swelling [ ] ; Back Pain [ ] ; Rash [ ]    Psych: Depression [ ] ; Anxiety [ ]    Heme: Bleeding problems [ ] ; Clotting disorders [ ] ; Anemia [ ]    Endocrine: Diabetes [ ] ; Thyroid dysfunction [ ]    Physical Exam/Data:   Vitals:   03/13/21 1740 03/13/21 1800 03/13/21 1900 03/13/21 2000  BP: 126/70 118/68 116/70 116/68  Pulse: 81 81 81 77  Resp: 19 (!) 24 18 17   Temp:    99 F (37.2 C)  TempSrc:    Oral  SpO2: 100% 99% 99% 99%  Weight:      Height:       No intake or output data in the 24 hours ending 03/14/21 0022 Last 3 Weights 03/13/2021 06/04/2016 05/29/2016  Weight (lbs) 140 lb 148 lb 148 lb  Weight (kg) 63.504 kg 67.132 kg 67.132 kg     Body mass index is 24.03 kg/m.  General:  Well nourished, well  developed, in no acute distress HEENT: normal Lymph: no adenopathy Neck: no JVD Endocrine:  No thryomegaly Vascular: No carotid bruits; FA pulses 2+ bilaterally without bruits  Cardiac:  normal S1, S2; RRR; no murmur  Lungs:  clear to auscultation bilaterally, no wheezing, rhonchi or rales  Abd: soft, nontender, no hepatomegaly  Ext: no edema Musculoskeletal:  No deformities, BUE and BLE strength normal and equal Skin: warm and dry  Neuro:  CNs 2-12 intact, no focal abnormalities noted Psych:  Normal affect   EKG:  The EKG was personally reviewed and demonstrates: (03/13/21, 16:22:45), NSR, ventricular rate 78 bpm, PR 198 ms, QRS 86 ms, Qtc 474 ms, 0.5 mm ST elevation in I/aVL otherwise no ischemic changes  Telemetry:  Telemetry was personally reviewed and demonstrates: NSR 70s  Relevant CV Studies: None   Laboratory Data:  High Sensitivity Troponin:   Recent Labs  Lab 03/13/21 1643 03/13/21 1935  TROPONINIHS 857* 834*     Chemistry Recent Labs  Lab 03/13/21 1643  NA 138  K 3.6  CL 104  CO2 21*  GLUCOSE 87  BUN 14  CREATININE 1.28*  CALCIUM 9.2  GFRNONAA 41*  ANIONGAP 13    Recent Labs  Lab 03/13/21 1643  PROT 6.7  ALBUMIN 4.1  AST 40  ALT 44  ALKPHOS 53  BILITOT 0.5   Hematology Recent Labs  Lab 03/13/21 1643  WBC 5.9  RBC 3.87  HGB 11.6*  HCT 34.8*  MCV 89.9  MCH 30.0  MCHC 33.3  RDW 15.7*  PLT 146*   BNPNo results for input(s): BNP, PROBNP in the last 168 hours.  DDimer No results for input(s): DDIMER in the last 168 hours.  Radiology/Studies:  CT Head Wo Contrast  Result Date: 03/13/2021 CLINICAL DATA:  Dementia, worsening confusion, falls EXAM: CT HEAD WITHOUT CONTRAST TECHNIQUE: Contiguous axial images were obtained from the base of the skull through the vertex without intravenous contrast. RADIATION DOSE REDUCTION: This exam was performed according to the departmental dose-optimization program which includes automated exposure control,  adjustment of the mA and/or kV according to patient size and/or use of iterative reconstruction technique. COMPARISON:  05/27/2016. FINDINGS: Brain: No evidence of acute infarction, hemorrhage, cerebral edema, mass, mass effect, or midline shift. No hydrocephalus or extra-axial fluid collection. Periventricular white matter changes, likely the sequela of chronic small vessel ischemic disease. Degree of cerebral atrophy is within normal limits for age and without lobar predominance Vascular: No hyperdense vessel. Skull: Normal. Negative for fracture or focal lesion. Sinuses/Orbits: No acute finding. Status post bilateral lens replacements. Other: Fluid in left mastoid tip. IMPRESSION: IMPRESSION No acute intracranial process. Electronically Signed   By: Wiliam Ke M.D.   On: 03/13/2021 17:42   CT Lumbar Spine Wo Contrast  Result Date: 03/13/2021 CLINICAL DATA:  Fall with back pain EXAM: CT LUMBAR SPINE WITHOUT CONTRAST TECHNIQUE: Multidetector CT imaging of the lumbar spine was performed without intravenous contrast administration. Multiplanar CT image reconstructions were also generated. RADIATION DOSE REDUCTION: This exam was performed according to the departmental dose-optimization program which includes automated exposure control, adjustment of the mA and/or kV according to patient size and/or use of iterative reconstruction technique. COMPARISON:  MRI 08/12/2015 FINDINGS: Segmentation: 5 lumbar type vertebrae. Alignment: Normal. Vertebrae: No acute fracture or focal pathologic process. Paraspinal and other soft tissues: Aortic atherosclerosis. Probable parapelvic renal cysts. No paravertebral or paraspinal soft tissue abnormality Disc levels: Mild diffuse disc bulge at L3-L4 with mild canal stenosis. Facet degenerative changes. No foraminal narrowing. Marked disc space narrowing at L4-L5. Diffuse disc bulge. Mild canal stenosis. Moderate facet degenerative changes with mild bilateral foraminal narrowing.  No significant canal stenosis at L5-S1. Facet degenerative changes without significant foraminal narrowing. IMPRESSION: 1. No acute osseous abnormality. 2. Multilevel degenerative changes without high-grade canal stenosis. Electronically Signed   By: Jasmine Pang M.D.   On: 03/13/2021 17:54   DG Chest Portable 1 View  Result Date: 03/13/2021 CLINICAL DATA:  Chest discomfort EXAM: PORTABLE CHEST 1 VIEW COMPARISON:  09/30/2015, 05/28/2016 FINDINGS: The heart size and mediastinal contours are within normal limits. Both lungs are clear. The visualized skeletal structures are unremarkable. Mild scarring or atelectasis at the bases. Probable skin fold artifact over the left upper thorax IMPRESSION: Probable skin fold artifact over the left upper thorax. Mild scarring or atelectasis at the bases Electronically Signed   By: Jasmine Pang M.D.   On: 03/13/2021 17:16   { Assessment and Plan:   Troponin elevation  Patient has no symptoms related to  anginal equivalents patient presents with multiple falls, no significant which was ongoing Sunday into Monday and most recent was Thursday.  She denies any anginal equivalents and does have awareness enough to report symptoms despite her dementia.  I would favor her elevated troponin to be related to rhabdomyolysis and not a type I NSTEMI based off presentation.  Discontinue heparin and obtain TTE to evaluate for any wall motion abnormalities given elevated troponin.  Most likely she will not need any further cardiac evaluation.  CK panel is pending.  pAF CHA2DS2-VASc (6) and currently prescribed rivaroxaban 20 mg daily.  Her creatinine clearance is only 14 with current AKI however at baseline is only 23 so she should be on reduced dose rivaroxaban 15 mg daily but we should not resume this until her kidney function stabilizes. D/c ASA when resuming reduced dose rivaroxaban.   For questions or updates, please contact [facility] Please consult [email or web address] for  contact info under   Signed, Linton Rump, MD  03/14/2021 12:22 AM

## 2021-03-14 NOTE — Progress Notes (Signed)
PROGRESS NOTE    Christine [name]  GMW:[id number] DOB: April 21, 1933 DOA: 03/13/2021 PCP: Rodrigo Ran, MD   Brief Narrative:  Christine [name] is a 88 y.o. female with medical history significant for essential hypertension, dyslipidemia, TIA, GERD, bilateral carotid artery stenosis, osteoarthritis and paroxysmal atrial fibrillation on Xarelto, presented to the emergency room at with acute onset of generalized fatigue and tiredness with recurrent falls. Notably symptoms are worse with standing, improved with laying down. Denies focal deficits, syncope, headache, fevers, chills, or chest pain.  Assessment & Plan:   Principal Problem:   NSTEMI (non-ST elevated myocardial infarction) (HCC) Active Problems:   Recurrent falls   AKI (acute kidney injury) (HCC)   Paroxysmal atrial fibrillation (HCC)   Dyslipidemia   GERD   Dementia without behavioral disturbance (HCC)   Vitamin B12 deficiency   Acute ambulatory dysfunction/falls/weakness Failure to thrive, poor p.o. intake -Unclear etiology, appears to be at least partially chronic per daughter at bedside -IV fluids ongoing since admission -Continue to increase p.o. intake as tolerated -PT OT to evaluate, high risk for falls, may benefit from ongoing physical therapy.  Patient and family requesting physical therapy outpatient versus at home -likely will not agree to SNF versus CIR  Rhabdomyolysis, acute  Secondary to above  Continue IV fluids Prolonged downtime Monday, apparently patient has been having multiple falls with somewhat prolonged downtime over the past week  NSTEMI (non-ST elevated myocardial infarction) (HCC) -Incidental, no episodes of chest pain likely secondary to profound dehydration and rhabdomyolysis -Continue to follow clinically, continue IV fluids -No indication for further cardiac testing or evaluation at this point -DC aspirin, continue Xarelto, metoprolol, losartan as below   AKI (acute kidney injury) (HCC) -In  the setting of poor p.o. intake dehydration and rhabdomyolysis -Resolving, continue IV fluids, increase p.o. intake as tolerated   Paroxysmal atrial fibrillation (HCC) -Transition off heparin back onto home Xarelto, discontinue aspirin  Dementia without behavioral disturbance (HCC) Continue home Aricept, Namenda -appears to be at baseline per daughter at bedside   GERD Continue PPI  DVT prophylaxis: Xarelto Code Status: DNR Family Communication: Daughter at bedside  Status is: Inpatient  Dispo: The patient is from: Home              Anticipated d/c is to: Home              Anticipated d/c date is: 24 to 48 hours              Patient currently not medically stable for discharge  Consultants:  Cardiology  Procedures:  None  Antimicrobials:  None  Subjective: No acute issues or events overnight denies nausea vomiting diarrhea constipation headache fevers chills or chest pain.  Requesting discharge home which we discussed was not appropriate today given her ongoing symptoms, hypovolemia and abnormal labs  Objective: Vitals:   03/13/21 1800 03/13/21 1900 03/13/21 2000 03/14/21 0433  BP: 118/68 116/70 116/68 (!) 99/57  Pulse: 81 81 77 61  Resp: (!) 24 18 17 15   Temp:   99 F (37.2 C) 98.1 F (36.7 C)  TempSrc:   Oral Oral  SpO2: 99% 99% 99% 97%  Weight:      Height:        Intake/Output Summary (Last 24 hours) at 03/14/2021 0816 Last data filed at 03/14/2021 0500 Gross per 24 hour  Intake 501.84 ml  Output --  Net 501.84 ml   Filed Weights   03/13/21 1618  Weight: 63.5 kg  Examination:  General:  Pleasantly resting in bed, No acute distress. Neck:  Without mass or deformity.  Trachea is midline. Lungs:  Clear to auscultate bilaterally without rhonchi, wheeze, or rales. Heart: Irregularly irregular, without murmurs, rubs, or gallops.  Data Reviewed: I have personally reviewed following labs and imaging studies  CBC: Recent Labs  Lab 03/13/21 1643  03/14/21 0145  WBC 5.9 4.8  NEUTROABS 4.0  --   HGB 11.6* 11.2*  HCT 34.8* 32.7*  MCV 89.9 89.6  PLT 146* 126*   Basic Metabolic Panel: Recent Labs  Lab 03/13/21 1643 03/14/21 0145  NA 138 134*  K 3.6 3.4*  CL 104 103  CO2 21* 22  GLUCOSE 87 112*  BUN 14 11  CREATININE 1.28* 1.22*  CALCIUM 9.2 8.5*   GFR: Estimated Creatinine Clearance: 28.6 mL/min (A) (by C-G formula based on SCr of 1.22 mg/dL (H)). Liver Function Tests: Recent Labs  Lab 03/13/21 1643  AST 40  ALT 44  ALKPHOS 53  BILITOT 0.5  PROT 6.7  ALBUMIN 4.1   No results for input(s): LIPASE, AMYLASE in the last 168 hours. No results for input(s): AMMONIA in the last 168 hours. Coagulation Profile: Recent Labs  Lab 03/13/21 1643  INR 2.0*   Cardiac Enzymes: Recent Labs  Lab 03/14/21 0145  CKTOTAL 736*   BNP (last 3 results) No results for input(s): PROBNP in the last 8760 hours. HbA1C: No results for input(s): HGBA1C in the last 72 hours. CBG: No results for input(s): GLUCAP in the last 168 hours. Lipid Profile: Recent Labs    03/14/21 0145  CHOL 172  HDL 87  LDLCALC 75  TRIG 48  CHOLHDL 2.0   Thyroid Function Tests: No results for input(s): TSH, T4TOTAL, FREET4, T3FREE, THYROIDAB in the last 72 hours. Anemia Panel: No results for input(s): VITAMINB12, FOLATE, FERRITIN, TIBC, IRON, RETICCTPCT in the last 72 hours. Sepsis Labs: No results for input(s): PROCALCITON, LATICACIDVEN in the last 168 hours.  Recent Results (from the past 240 hour(s))  Resp Panel by RT-PCR (Flu A&B, Covid) Nasopharyngeal Swab     Status: None   Collection Time: 03/13/21  4:43 PM   Specimen: Nasopharyngeal Swab; Nasopharyngeal(NP) swabs in vial transport medium  Result Value Ref Range Status   SARS Coronavirus 2 by RT PCR NEGATIVE NEGATIVE Final    Comment: (NOTE) SARS-CoV-2 target nucleic acids are NOT DETECTED.  The SARS-CoV-2 RNA is generally detectable in upper respiratory specimens during the acute  phase of infection. The lowest concentration of SARS-CoV-2 viral copies this assay can detect is 138 copies/mL. A negative result does not preclude SARS-Cov-2 infection and should not be used as the sole basis for treatment or other patient management decisions. A negative result may occur with  improper specimen collection/handling, submission of specimen other than nasopharyngeal swab, presence of viral mutation(s) within the areas targeted by this assay, and inadequate number of viral copies(<138 copies/mL). A negative result must be combined with clinical observations, patient history, and epidemiological information. The expected result is Negative.  Fact Sheet for Patients:  BloggerCourse.com  Fact Sheet for Healthcare Providers:  SeriousBroker.it  This test is no t yet approved or cleared by the [place] FDA and  has been authorized for detection and/or diagnosis of SARS-CoV-2 by FDA under an Emergency Use Authorization (EUA). This EUA will remain  in effect (meaning this test can be used) for the duration of the COVID-19 declaration under Section 564(b)(1) of the Act, 21 U.S.C.section 360bbb-3(b)(1), unless the  authorization is terminated  or revoked sooner.       Influenza A by PCR NEGATIVE NEGATIVE Final   Influenza B by PCR NEGATIVE NEGATIVE Final    Comment: (NOTE) The Xpert Xpress SARS-CoV-2/FLU/RSV plus assay is intended as an aid in the diagnosis of influenza from Nasopharyngeal swab specimens and should not be used as a sole basis for treatment. Nasal washings and aspirates are unacceptable for Xpert Xpress SARS-CoV-2/FLU/RSV testing.  Fact Sheet for Patients: BloggerCourse.com  Fact Sheet for Healthcare Providers: SeriousBroker.it  This test is not yet approved or cleared by the [place] FDA and has been authorized for detection and/or diagnosis of  SARS-CoV-2 by FDA under an Emergency Use Authorization (EUA). This EUA will remain in effect (meaning this test can be used) for the duration of the COVID-19 declaration under Section 564(b)(1) of the Act, 21 U.S.C. section 360bbb-3(b)(1), unless the authorization is terminated or revoked.  Performed at [facility], [address] [place], [place] [address]          Radiology Studies: CT Head Wo Contrast  Result Date: 03/13/2021 CLINICAL DATA:  Dementia, worsening confusion, falls EXAM: CT HEAD WITHOUT CONTRAST TECHNIQUE: Contiguous axial images were obtained from the base of the skull through the vertex without intravenous contrast. RADIATION DOSE REDUCTION: This exam was performed according to the departmental dose-optimization program which includes automated exposure control, adjustment of the mA and/or kV according to patient size and/or use of iterative reconstruction technique. COMPARISON:  05/27/2016. FINDINGS: Brain: No evidence of acute infarction, hemorrhage, cerebral edema, mass, mass effect, or midline shift. No hydrocephalus or extra-axial fluid collection. Periventricular white matter changes, likely the sequela of chronic small vessel ischemic disease. Degree of cerebral atrophy is within normal limits for age and without lobar predominance Vascular: No hyperdense vessel. Skull: Normal. Negative for fracture or focal lesion. Sinuses/Orbits: No acute finding. Status post bilateral lens replacements. Other: Fluid in left mastoid tip. IMPRESSION: IMPRESSION No acute intracranial process. Electronically Signed   By: Wiliam Ke M.D.   On: 03/13/2021 17:42   CT Lumbar Spine Wo Contrast  Result Date: 03/13/2021 CLINICAL DATA:  Fall with back pain EXAM: CT LUMBAR SPINE WITHOUT CONTRAST TECHNIQUE: Multidetector CT imaging of the lumbar spine was performed without intravenous contrast administration. Multiplanar CT image reconstructions were also generated.  RADIATION DOSE REDUCTION: This exam was performed according to the departmental dose-optimization program which includes automated exposure control, adjustment of the mA and/or kV according to patient size and/or use of iterative reconstruction technique. COMPARISON:  MRI 08/12/2015 FINDINGS: Segmentation: 5 lumbar type vertebrae. Alignment: Normal. Vertebrae: No acute fracture or focal pathologic process. Paraspinal and other soft tissues: Aortic atherosclerosis. Probable parapelvic renal cysts. No paravertebral or paraspinal soft tissue abnormality Disc levels: Mild diffuse disc bulge at L3-L4 with mild canal stenosis. Facet degenerative changes. No foraminal narrowing. Marked disc space narrowing at L4-L5. Diffuse disc bulge. Mild canal stenosis. Moderate facet degenerative changes with mild bilateral foraminal narrowing. No significant canal stenosis at L5-S1. Facet degenerative changes without significant foraminal narrowing. IMPRESSION: 1. No acute osseous abnormality. 2. Multilevel degenerative changes without high-grade canal stenosis. Electronically Signed   By: Jasmine Pang M.D.   On: 03/13/2021 17:54   DG Chest Portable 1 View  Result Date: 03/13/2021 CLINICAL DATA:  Chest discomfort EXAM: PORTABLE CHEST 1 VIEW COMPARISON:  09/30/2015, 05/28/2016 FINDINGS: The heart size and mediastinal contours are within normal limits. Both lungs are clear. The visualized skeletal structures are unremarkable. Mild scarring or  atelectasis at the bases. Probable skin fold artifact over the left upper thorax IMPRESSION: Probable skin fold artifact over the left upper thorax. Mild scarring or atelectasis at the bases Electronically Signed   By: Jasmine Pang M.D.   On: 03/13/2021 17:16    Scheduled Meds:  aspirin EC  81 mg Oral Daily   cholecalciferol  1,000 Units Oral Daily   donepezil  10 mg Oral Daily   losartan  50 mg Oral Daily   memantine  5 mg Oral Daily   metoprolol tartrate  12.5 mg Oral BID    mirabegron ER  50 mg Oral Daily   pantoprazole  40 mg Oral q morning   rivaroxaban  15 mg Oral Q supper   vitamin B-12  1,000 mcg Oral Daily   Continuous Infusions:  sodium chloride 100 mL/hr at 03/14/21 0500     LOS: 1 day   Time spent:  Azucena Fallen, DO [facility]  If 7PM-7AM, please contact night-coverage [email or web address]  03/14/2021, 8:16 AM

## 2021-03-14 NOTE — Progress Notes (Signed)
? ?Progress Note ? ?Patient Name: Christine [name] ?Date of Encounter: 03/14/2021 ? ?[facility] Cardiologist: None  ? ?Subjective  ? ?Complains of pain in her tail bone. No chest pain or SOB. ? ?Inpatient Medications  ?  ?Scheduled Meds: ? aspirin EC  81 mg Oral Daily  ? cholecalciferol  1,000 Units Oral Daily  ? donepezil  10 mg Oral Daily  ? losartan  50 mg Oral Daily  ? memantine  5 mg Oral Daily  ? metoprolol tartrate  12.5 mg Oral BID  ? mirabegron ER  50 mg Oral Daily  ? pantoprazole  40 mg Oral q morning  ? rivaroxaban  15 mg Oral Q supper  ? vitamin B-12  1,000 mcg Oral Daily  ? ?Continuous Infusions: ? sodium chloride 100 mL/hr at 03/14/21 0500  ? ?PRN Meds: ?acetaminophen, ALPRAZolam, magnesium hydroxide, morphine injection, nitroGLYCERIN, ondansetron (ZOFRAN) IV, traZODone  ? ?Vital Signs  ?  ?Vitals:  ? 03/13/21 1800 03/13/21 1900 03/13/21 2000 03/14/21 0433  ?BP: 118/68 116/70 116/68 (!) 99/57  ?Pulse: 81 81 77 61  ?Resp: (!) '24 18 17 15  '$ ?Temp:   99 ?F (37.2 ?C) 98.1 ?F (36.7 ?C)  ?TempSrc:   Oral Oral  ?SpO2: 99% 99% 99% 97%  ?Weight:      ?Height:      ? ? ?Intake/Output Summary (Last 24 hours) at 03/14/2021 0919 ?Last data filed at 03/14/2021 0500 ?Gross per 24 hour  ?Intake 501.84 ml  ?Output --  ?Net 501.84 ml  ? ?Last 3 Weights 03/13/2021 06/04/2016 05/29/2016  ?Weight (lbs) 140 lb 148 lb 148 lb  ?Weight (kg) 63.504 kg 67.132 kg 67.132 kg  ?   ? ?Telemetry  ?  ?NSR - Personally Reviewed ? ?ECG  ?  ?NSR, subtle STE in lateral leads - Personally Reviewed ? ?Physical Exam  ? ?GEN: Elderly female, NAD ?Neck: No JVD ?Cardiac: RRR, no murmurs, rubs, or gallops.  ?Respiratory: Clear to auscultation bilaterally. ?GI: Soft, nontender, non-distended  ?MS: No edema; No deformity. ?Neuro:  Nonfocal  ?Psych: Normal affect, pleasant ? ?Labs  ?  ?High Sensitivity Troponin:   ?Recent Labs  ?Lab 03/13/21 ?1643 03/13/21 ?1935  ?TROPONINIHS 857* 656*  ?   ?Chemistry ?Recent Labs  ?Lab 03/13/21 ?1643 03/14/21 ?0145   ?NA 138 134*  ?K 3.6 3.4*  ?CL 104 103  ?CO2 21* 22  ?GLUCOSE 87 112*  ?BUN 14 11  ?CREATININE 1.28* 1.22*  ?CALCIUM 9.2 8.5*  ?PROT 6.7  --   ?ALBUMIN 4.1  --   ?AST 40  --   ?ALT 44  --   ?ALKPHOS 53  --   ?BILITOT 0.5  --   ?GFRNONAA 41* 43*  ?ANIONGAP 13 9  ?  ?Lipids  ?Recent Labs  ?Lab 03/14/21 ?0145  ?CHOL 172  ?TRIG 48  ?HDL 87  ?[place] 75  ?CHOLHDL 2.0  ?  ?Hematology ?Recent Labs  ?Lab 03/13/21 ?1643 03/14/21 ?0145  ?WBC 5.9 4.8  ?RBC 3.87 3.65*  ?HGB 11.6* 11.2*  ?HCT 34.8* 32.7*  ?MCV 89.9 89.6  ?MCH 30.0 30.7  ?MCHC 33.3 34.3  ?RDW 15.7* 15.6*  ?PLT 146* 126*  ? ?Thyroid No results for input(s): TSH, FREET4 in the last 168 hours.  ?BNPNo results for input(s): BNP, PROBNP in the last 168 hours.  ?DDimer No results for input(s): DDIMER in the last 168 hours.  ? ?Radiology  ?  ?CT Head Wo Contrast ? ?Result Date: 03/13/2021 ?CLINICAL DATA:  Dementia, worsening  confusion, falls EXAM: CT HEAD WITHOUT CONTRAST TECHNIQUE: Contiguous axial images were obtained from the base of the skull through the vertex without intravenous contrast. RADIATION DOSE REDUCTION: This exam was performed according to the departmental dose-optimization program which includes automated exposure control, adjustment of the mA and/or kV according to patient size and/or use of iterative reconstruction technique. COMPARISON:  05/27/2016. FINDINGS: Brain: No evidence of acute infarction, hemorrhage, cerebral edema, mass, mass effect, or midline shift. No hydrocephalus or extra-axial fluid collection. Periventricular white matter changes, likely the sequela of chronic small vessel ischemic disease. Degree of cerebral atrophy is within normal limits for age and without lobar predominance Vascular: No hyperdense vessel. Skull: Normal. Negative for fracture or focal lesion. Sinuses/Orbits: No acute finding. Status post bilateral lens replacements. Other: Fluid in left mastoid tip. IMPRESSION: IMPRESSION No acute intracranial process.  Electronically Signed   By: Merilyn Baba M.D.   On: 03/13/2021 17:42  ? ?CT Lumbar Spine Wo Contrast ? ?Result Date: 03/13/2021 ?CLINICAL DATA:  Fall with back pain EXAM: CT LUMBAR SPINE WITHOUT CONTRAST TECHNIQUE: Multidetector CT imaging of the lumbar spine was performed without intravenous contrast administration. Multiplanar CT image reconstructions were also generated. RADIATION DOSE REDUCTION: This exam was performed according to the departmental dose-optimization program which includes automated exposure control, adjustment of the mA and/or kV according to patient size and/or use of iterative reconstruction technique. COMPARISON:  MRI 08/12/2015 FINDINGS: Segmentation: 5 lumbar type vertebrae. Alignment: Normal. Vertebrae: No acute fracture or focal pathologic process. Paraspinal and other soft tissues: Aortic atherosclerosis. Probable parapelvic renal cysts. No paravertebral or paraspinal soft tissue abnormality Disc levels: Mild diffuse disc bulge at L3-L4 with mild canal stenosis. Facet degenerative changes. No foraminal narrowing. Marked disc space narrowing at L4-L5. Diffuse disc bulge. Mild canal stenosis. Moderate facet degenerative changes with mild bilateral foraminal narrowing. No significant canal stenosis at L5-S1. Facet degenerative changes without significant foraminal narrowing. IMPRESSION: 1. No acute osseous abnormality. 2. Multilevel degenerative changes without high-grade canal stenosis. Electronically Signed   By: Donavan Foil M.D.   On: 03/13/2021 17:54  ? ?DG Chest Portable 1 View ? ?Result Date: 03/13/2021 ?CLINICAL DATA:  Chest discomfort EXAM: PORTABLE CHEST 1 VIEW COMPARISON:  09/30/2015, 05/28/2016 FINDINGS: The heart size and mediastinal contours are within normal limits. Both lungs are clear. The visualized skeletal structures are unremarkable. Mild scarring or atelectasis at the bases. Probable skin fold artifact over the left upper thorax IMPRESSION: Probable skin fold artifact  over the left upper thorax. Mild scarring or atelectasis at the bases Electronically Signed   By: Donavan Foil M.D.   On: 03/13/2021 17:16   ? ?Cardiac Studies  ? ?TTE 2015: ?Study Conclusions  ? ?- Left ventricle: The cavity size was normal. There was mild  ?  concentric hypertrophy. Systolic function was normal. The  ?  estimated ejection fraction was in the range of 60% to 65%. Wall  ?  motion was normal; there were no regional wall motion  ?  abnormalities. Doppler parameters are consistent with abnormal  ?  left ventricular relaxation (grade 1 diastolic dysfunction).  ?- Aortic valve: There was mild regurgitation.  ?- Mitral valve: Calcified annulus. There was mild regurgitation.  ? ?Patient Profile  ?   ?88 y.o. female with dementia, paroxysmal atrial fibrillation, HLD, prior CVA and hypertension who presented after fall found to have elevated troponin for which Cardiology has been consulted. ? ?Assessment & Plan  ?  ?#Elevated troponin: ?Patient presented after fall found to  have elevated troponin in 800s on admission. Denies any anginal symptoms at home and the patient is able to perform ADLs without issues. Brief look at TTE (being performed while in the room) demonstrates mid-to-apical anterior and septal WMA. Discussed with the patient and her daughter at bedside. Given lack of symptoms, advanced age and medical comorbidities, they prefer conservative management at this time.  ?-Stop ASA once resumed on xarelto ?-On praulent as out-patient ?-Continue losartan '50mg'$  daily ?-Continue metop 12.'5mg'$  BID; transition to long-acting prior to discharge ?-Will continue conservative management per patient wishes ? ?#PAFib: ?CHADs-vasc 6. On xarelto for [facility] ?-Resume xarelto once able ?-Change metop to long-acting prior to discharge ? ?Cardiology will sign-off. Patient and family declined Cardiology follow-up unless clinical change.  ? ?   ? ?For questions or updates, please contact [place] ?Please consult  [email or web address] for contact info under  ? ?  ?   ?Signed, ?Freada Bergeron, MD  ?03/14/2021, 9:19 AM    ?

## 2021-03-14 NOTE — Assessment & Plan Note (Signed)
-   The patient will be on IV heparin ?- We will holding off Xarelto. ?

## 2021-03-14 NOTE — Assessment & Plan Note (Signed)
-   The patient will be hydrated with IV normal saline and will follow BMP. ?- We will avoid nephrotoxins. ?- This could be prerenal due to volume depletion. ?

## 2021-03-14 NOTE — Assessment & Plan Note (Signed)
-   We will continue Aricept and Namenda. 

## 2021-03-14 NOTE — Assessment & Plan Note (Signed)
-   We will continue PPI therapy 

## 2021-03-14 NOTE — Progress Notes (Signed)
Initial Nutrition Assessment ? ?DOCUMENTATION CODES:  ? ?Non-severe (moderate) malnutrition in context of chronic illness ? ?INTERVENTION:  ?Liberalize diet to regular ?Ensure Enlive po TID, each supplement provides 350 kcal and 20 grams of protein. ?MVI with minerals daily  ? ? ?NUTRITION DIAGNOSIS:  ? ?Moderate Malnutrition related to chronic illness (dementia) as evidenced by mild fat depletion, moderate muscle depletion. ? ?GOAL:  ? ?Patient will meet greater than or equal to 90% of their needs ? ?MONITOR:  ? ?PO intake, Supplement acceptance, Labs, Weight trends ? ?REASON FOR ASSESSMENT:  ? ?Malnutrition Screening Tool ?  ? ?ASSESSMENT:  ? ? Pt is a 88 year old female with medical history significant of HTN, HLD, GERD, AKI, dementia, bilateral carotid artery stenosis, osteoarthritis, and paroxysmal atrial fibrillation, who presented to the ED with acute onset of generalized fatigue and multiple falls and was admitted for further evaluation and management of NSTEMI.  ? ?Met with pt at bedside. Pt's daughter present in room during visit. Per pt, pt reports a fair appetite today and pt's daughter states that pt had [employer] brought in this morning and pt ate only ate 2-3 bits of a sausage biscuit. Pt's daughter reports that family member is picking up a potato soup for pt to eat for lunch today. Per pt's daughter, pt typically eats two meals a day. Pt reports that at home she eats a fried bologna and fried egg sandwich on white bread and pt's daughter reports that this meal is occasionally. Pt's daughter reports that pt typically has something sweet for breakfast in the morning such as a raisin cake with a cup of coffee. Pt's daughter states that pt likes sweet foods. For lunch, pt's daughter states that pt typically drives down to the nearby restaurant and orders a hot dog or bowl of soup or chicken tenders for lunch. Pt's daughter reports that if pt eats at home, she typically will make a ham sandwich. Per pt's  daughter, if pt eats lunch later in the day, she may not eat dinner and typically will snack on crackers later in the day. Per pt's daughter, pt lives alone and ambulates on her own at baseline. Pt's daughter also reports that they all live on the same road together and that there is a nearby store/restaurant and that pt still drives herself to the store, as well as nearby to visit her friends. Pt's daughter states that pt did get up and walk around the room today with daughter's assistance. Pt's daughter states that she did a good job walking, however, she still feels like she is unbalanced at times.  ? ?Discussed adequate PO intake and oral nutrition supplements with pt and pt agreeable to trying Ensure (vanilla) as pt states that she drinks a lot of milk at baseline and feels that she will be able to tolerate Ensure.  ? ?Current wt: 63.5 kg  ?Per pt's weight history, last documented weight after this admission was in 2018. Per pt and pt's daughter, UBW is 140# and states that pt weighed this amount last October when she visited the [place] office. Pt's daughter and pt suspect that pt has lost weight since then as pt's daughter states that her clothes are more loose fitting.  ? ?Labs reviewed and include: Na: 134, Potassium: 3.4, Creatinine: 1.22, Calcium: 8.5, Hemoglobin: 11.2  ? ?Potassium is low and is being repleted with klor-con-m.  ? ?Medications reviewed and include:  ? cholecalciferol  1,000 Units Oral Daily  ? memantine  5  mg Oral Daily  ? pantoprazole  40 mg Oral q morning  ? vitamin B-12  1,000 mcg Oral Daily  ? ?Continuous Infusions: ? sodium chloride 100 mL/hr at 03/14/21 1138  ? ?NUTRITION - FOCUSED PHYSICAL EXAM: ? ?Flowsheet Row Most Recent Value  ?Orbital Region No depletion  ?Upper Arm Region Moderate depletion  ?Thoracic and Lumbar Region Mild depletion  ?Buccal Region Mild depletion  ?Temple Region Mild depletion  ?Clavicle Bone Region Mild depletion  ?Clavicle and Acromion Bone Region Mild  depletion  ?Scapular Bone Region Mild depletion  ?Dorsal Hand Moderate depletion  ?Patellar Region Mild depletion  ?Anterior Thigh Region Mild depletion  ?Posterior Calf Region Moderate depletion  ?Edema (RD Assessment) Mild  [edema present in lower extremities]  ?Hair Reviewed  ?Eyes Reviewed  ?Mouth Reviewed  ?Skin Reviewed  ?Nails Reviewed  ? ?  ? ? ?Diet Order:   ?Diet Order   ? ?       ?  Diet regular Room service appropriate? Yes with Assist; Fluid consistency: Thin  Diet effective now       ?  ? ?  ?  ? ?  ? ? ?EDUCATION NEEDS:  ? ?Not appropriate for education at this time ? ?Skin:  Skin Assessment: Reviewed RN Assessment ? ?Last BM:  3/9 ? ?Height:  ? ?Ht Readings from Last 1 Encounters:  ?03/13/21 5' 4" (1.626 m)  ? ? ?Weight:  ? ?Wt Readings from Last 1 Encounters:  ?03/13/21 63.5 kg  ? ? ?BMI:  Body mass index is 24.03 kg/m?. ? ?Estimated Nutritional Needs:  ? ?Kcal:  1600 - 1800 ? ?Protein:  80 - 95 grams ? ?Fluid:  >/= 1.6 L ? ? ? ?Maryruth Hancock, Dietetic Intern ?03/14/2021 4:27 PM ?

## 2021-03-14 NOTE — Assessment & Plan Note (Addendum)
-   The patient is directly admitted to a PCU bed. ?- She is not having chest pain and she had recurrent falls. ?- EKG was unremarkable and troponin I has been stable. ?- I highly suspect acute rhabdomyolysis in her case. ?- I added CK and CK isoenzymes for further assessment. ?- Should be hydrated with IV normal saline.  We will follow serial troponins. ?- I discussed the case with Dr. Renella Cunas who will see the patient. ?- We will place the patient on aspirin as well as beta-blocker therapy and as needed sublingual nitroglycerin and morphine sulfate if she has any chest pain. ?- We will obtain a 2D echo for further assessment. ?

## 2021-03-14 NOTE — Progress Notes (Signed)
Echocardiogram ?2D Echocardiogram has been performed. ? ?Christine [name] ?03/14/2021, 9:28 AM ?

## 2021-03-14 NOTE — Plan of Care (Signed)
  Problem: Activity: Goal: Risk for activity intolerance will decrease Outcome: Progressing   

## 2021-03-15 DIAGNOSIS — E440 Moderate protein-calorie malnutrition: Secondary | ICD-10-CM | POA: Insufficient documentation

## 2021-03-15 LAB — BASIC METABOLIC PANEL
Anion gap: 9 (ref 5–15)
BUN: 11 mg/dL (ref 8–23)
CO2: 19 mmol/L — ABNORMAL LOW (ref 22–32)
Calcium: 8.4 mg/dL — ABNORMAL LOW (ref 8.9–10.3)
Chloride: 111 mmol/L (ref 98–111)
Creatinine, Ser: 1.12 mg/dL — ABNORMAL HIGH (ref 0.44–1.00)
GFR, Estimated: 48 mL/min — ABNORMAL LOW (ref >60)
Glucose, Bld: 109 mg/dL — ABNORMAL HIGH (ref 70–99)
Potassium: 4.2 mmol/L (ref 3.5–5.1)
Sodium: 139 mmol/L (ref 135–145)

## 2021-03-15 LAB — CBC
HCT: 31.1 % — ABNORMAL LOW (ref 36.0–46.0)
Hemoglobin: 10.5 g/dL — ABNORMAL LOW (ref 12.0–15.0)
MCH: 30.8 pg (ref 26.0–34.0)
MCHC: 33.8 g/dL (ref 30.0–36.0)
MCV: 91.2 fL (ref 80.0–100.0)
Platelets: 119 10*3/uL — ABNORMAL LOW (ref 150–400)
RBC: 3.41 MIL/uL — ABNORMAL LOW (ref 3.87–5.11)
RDW: 15.9 % — ABNORMAL HIGH (ref 11.5–15.5)
WBC: 3.9 10*3/uL — ABNORMAL LOW (ref 4.0–10.5)
nRBC: 0 % (ref 0.0–0.2)

## 2021-03-15 LAB — CK: Total CK: 557 U/L — ABNORMAL HIGH (ref 38–234)

## 2021-03-15 MED ORDER — METOPROLOL TARTRATE 25 MG PO TABS: 12.5000 mg | ORAL_TABLET | Freq: Two times a day (BID) | ORAL | 0 refills | Status: DC

## 2021-03-15 NOTE — Discharge Summary (Signed)
Physician Discharge Summary  Christine [name] [id number] DOB: August 28, 1933 DOA: 03/13/2021  PCP: Crist Infante, MD  Admit date: 03/13/2021 Discharge date: 03/15/2021  Admitted From: Home Disposition: Home  Recommendations for Outpatient Follow-up:  Follow up with PCP in 1-2 weeks Please obtain BMP/CBC in one week  Discharge Condition: Stable CODE STATUS: DNR Diet recommendation: Regular diet as tolerated  Brief/Interim Summary: Christine [name] is a 88 y.o. female with medical history significant for essential hypertension, dyslipidemia, TIA, GERD, bilateral carotid artery stenosis, osteoarthritis and paroxysmal atrial fibrillation on Xarelto, presented to the emergency room at with acute onset of generalized fatigue and tiredness with recurrent falls. Notably symptoms are worse with standing, improved with laying down. Denies focal deficits, syncope, headache, fevers, chills, or chest pain.  Patient admitted with acute ambulatory dysfunction recurrent falls weakness failure to thrive poor p.o. intake dehydration rhabdomyolysis with minimally elevated troponin initially concerning for NSTEMI but given profound volume depletion and poor p.o. intake this is likely reactive to supply/demand mismatch rather than true cardiac etiology.  Patient's AKI urine output and p.o. intake drastically improved over the past 24 hours.  Initially patient was to be evaluated by PT for further recommendations at discharge but family requesting discharge home with outpatient follow-up with PCP and initiation of PT through their office as they have done previously which is certainly reasonable.  Otherwise patient's transient episode of A-fib appears to be well controlled at this time, continue Xarelto.  Baseline dementia appears to be at baseline.  Discharge Diagnoses:  Principal Problem:   NSTEMI (non-ST elevated myocardial infarction) ([place]) Active Problems:   Recurrent falls   AKI (acute kidney injury) ([place])    Paroxysmal atrial fibrillation ([place])   Dyslipidemia   GERD   Dementia without behavioral disturbance ([place])   Vitamin B12 deficiency   Malnutrition of moderate degree    Discharge Instructions  Discharge Instructions     Discharge patient   Complete by: As directed    Discharge disposition: 01-Home or Self Care   Discharge patient date: 03/15/2021      Allergies as of 03/15/2021       Reactions   Atorvastatin    Other reaction(s): hot flashes; felt like walking/leaning to L side; constipation   Citalopram    Other reaction(s): felt it made the inside and outside of her skin burn.it is not clear if this is an allergy or just an adverse reaction of some kind.   Duloxetine Hcl    Other reaction(s): she can't tolerate it and wanted it listed as an allergy   Ezetimibe    Other reaction(s): "had problems with it"   Fluvastatin    Other reaction(s): made legs ache.   Gabapentin Other (See Comments)   hallucinations   Oxybutynin Chloride    Other reaction(s): throat swelling; burning eyelids; nausea   Pravastatin    Other reaction(s): "had problems with it"   Pregabalin    Other reaction(s): made her feel funny   Simvastatin    Other reaction(s): leg pain   Zolpidem Tartrate    Other reaction(s): hallucinations.        Medication List     STOP taking these medications    amoxicillin 500 MG capsule Commonly known as: AMOXIL       TAKE these medications    docusate sodium 100 MG capsule Commonly known as: COLACE Take 100 mg by mouth daily as needed for mild constipation.   donepezil 10 MG tablet Commonly known as: ARICEPT Take  Physician Discharge Summary  Christine [name] [id number] DOB: August 28, 1933 DOA: 03/13/2021  PCP: Crist Infante, MD  Admit date: 03/13/2021 Discharge date: 03/15/2021  Admitted From: Home Disposition: Home  Recommendations for Outpatient Follow-up:  Follow up with PCP in 1-2 weeks Please obtain BMP/CBC in one week  Discharge Condition: Stable CODE STATUS: DNR Diet recommendation: Regular diet as tolerated  Brief/Interim Summary: Christine [name] is a 88 y.o. female with medical history significant for essential hypertension, dyslipidemia, TIA, GERD, bilateral carotid artery stenosis, osteoarthritis and paroxysmal atrial fibrillation on Xarelto, presented to the emergency room at with acute onset of generalized fatigue and tiredness with recurrent falls. Notably symptoms are worse with standing, improved with laying down. Denies focal deficits, syncope, headache, fevers, chills, or chest pain.  Patient admitted with acute ambulatory dysfunction recurrent falls weakness failure to thrive poor p.o. intake dehydration rhabdomyolysis with minimally elevated troponin initially concerning for NSTEMI but given profound volume depletion and poor p.o. intake this is likely reactive to supply/demand mismatch rather than true cardiac etiology.  Patient's AKI urine output and p.o. intake drastically improved over the past 24 hours.  Initially patient was to be evaluated by PT for further recommendations at discharge but family requesting discharge home with outpatient follow-up with PCP and initiation of PT through their office as they have done previously which is certainly reasonable.  Otherwise patient's transient episode of A-fib appears to be well controlled at this time, continue Xarelto.  Baseline dementia appears to be at baseline.  Discharge Diagnoses:  Principal Problem:   NSTEMI (non-ST elevated myocardial infarction) ([place]) Active Problems:   Recurrent falls   AKI (acute kidney injury) ([place])    Paroxysmal atrial fibrillation ([place])   Dyslipidemia   GERD   Dementia without behavioral disturbance ([place])   Vitamin B12 deficiency   Malnutrition of moderate degree    Discharge Instructions  Discharge Instructions     Discharge patient   Complete by: As directed    Discharge disposition: 01-Home or Self Care   Discharge patient date: 03/15/2021      Allergies as of 03/15/2021       Reactions   Atorvastatin    Other reaction(s): hot flashes; felt like walking/leaning to L side; constipation   Citalopram    Other reaction(s): felt it made the inside and outside of her skin burn.it is not clear if this is an allergy or just an adverse reaction of some kind.   Duloxetine Hcl    Other reaction(s): she can't tolerate it and wanted it listed as an allergy   Ezetimibe    Other reaction(s): "had problems with it"   Fluvastatin    Other reaction(s): made legs ache.   Gabapentin Other (See Comments)   hallucinations   Oxybutynin Chloride    Other reaction(s): throat swelling; burning eyelids; nausea   Pravastatin    Other reaction(s): "had problems with it"   Pregabalin    Other reaction(s): made her feel funny   Simvastatin    Other reaction(s): leg pain   Zolpidem Tartrate    Other reaction(s): hallucinations.        Medication List     STOP taking these medications    amoxicillin 500 MG capsule Commonly known as: AMOXIL       TAKE these medications    docusate sodium 100 MG capsule Commonly known as: COLACE Take 100 mg by mouth daily as needed for mild constipation.   donepezil 10 MG tablet Commonly known as: ARICEPT Take  Physician Discharge Summary  Christine [name] [id number] DOB: August 28, 1933 DOA: 03/13/2021  PCP: Crist Infante, MD  Admit date: 03/13/2021 Discharge date: 03/15/2021  Admitted From: Home Disposition: Home  Recommendations for Outpatient Follow-up:  Follow up with PCP in 1-2 weeks Please obtain BMP/CBC in one week  Discharge Condition: Stable CODE STATUS: DNR Diet recommendation: Regular diet as tolerated  Brief/Interim Summary: Christine [name] is a 88 y.o. female with medical history significant for essential hypertension, dyslipidemia, TIA, GERD, bilateral carotid artery stenosis, osteoarthritis and paroxysmal atrial fibrillation on Xarelto, presented to the emergency room at with acute onset of generalized fatigue and tiredness with recurrent falls. Notably symptoms are worse with standing, improved with laying down. Denies focal deficits, syncope, headache, fevers, chills, or chest pain.  Patient admitted with acute ambulatory dysfunction recurrent falls weakness failure to thrive poor p.o. intake dehydration rhabdomyolysis with minimally elevated troponin initially concerning for NSTEMI but given profound volume depletion and poor p.o. intake this is likely reactive to supply/demand mismatch rather than true cardiac etiology.  Patient's AKI urine output and p.o. intake drastically improved over the past 24 hours.  Initially patient was to be evaluated by PT for further recommendations at discharge but family requesting discharge home with outpatient follow-up with PCP and initiation of PT through their office as they have done previously which is certainly reasonable.  Otherwise patient's transient episode of A-fib appears to be well controlled at this time, continue Xarelto.  Baseline dementia appears to be at baseline.  Discharge Diagnoses:  Principal Problem:   NSTEMI (non-ST elevated myocardial infarction) ([place]) Active Problems:   Recurrent falls   AKI (acute kidney injury) ([place])    Paroxysmal atrial fibrillation ([place])   Dyslipidemia   GERD   Dementia without behavioral disturbance ([place])   Vitamin B12 deficiency   Malnutrition of moderate degree    Discharge Instructions  Discharge Instructions     Discharge patient   Complete by: As directed    Discharge disposition: 01-Home or Self Care   Discharge patient date: 03/15/2021      Allergies as of 03/15/2021       Reactions   Atorvastatin    Other reaction(s): hot flashes; felt like walking/leaning to L side; constipation   Citalopram    Other reaction(s): felt it made the inside and outside of her skin burn.it is not clear if this is an allergy or just an adverse reaction of some kind.   Duloxetine Hcl    Other reaction(s): she can't tolerate it and wanted it listed as an allergy   Ezetimibe    Other reaction(s): "had problems with it"   Fluvastatin    Other reaction(s): made legs ache.   Gabapentin Other (See Comments)   hallucinations   Oxybutynin Chloride    Other reaction(s): throat swelling; burning eyelids; nausea   Pravastatin    Other reaction(s): "had problems with it"   Pregabalin    Other reaction(s): made her feel funny   Simvastatin    Other reaction(s): leg pain   Zolpidem Tartrate    Other reaction(s): hallucinations.        Medication List     STOP taking these medications    amoxicillin 500 MG capsule Commonly known as: AMOXIL       TAKE these medications    docusate sodium 100 MG capsule Commonly known as: COLACE Take 100 mg by mouth daily as needed for mild constipation.   donepezil 10 MG tablet Commonly known as: ARICEPT Take  Physician Discharge Summary  Christine [name] [id number] DOB: August 28, 1933 DOA: 03/13/2021  PCP: Crist Infante, MD  Admit date: 03/13/2021 Discharge date: 03/15/2021  Admitted From: Home Disposition: Home  Recommendations for Outpatient Follow-up:  Follow up with PCP in 1-2 weeks Please obtain BMP/CBC in one week  Discharge Condition: Stable CODE STATUS: DNR Diet recommendation: Regular diet as tolerated  Brief/Interim Summary: Christine [name] is a 88 y.o. female with medical history significant for essential hypertension, dyslipidemia, TIA, GERD, bilateral carotid artery stenosis, osteoarthritis and paroxysmal atrial fibrillation on Xarelto, presented to the emergency room at with acute onset of generalized fatigue and tiredness with recurrent falls. Notably symptoms are worse with standing, improved with laying down. Denies focal deficits, syncope, headache, fevers, chills, or chest pain.  Patient admitted with acute ambulatory dysfunction recurrent falls weakness failure to thrive poor p.o. intake dehydration rhabdomyolysis with minimally elevated troponin initially concerning for NSTEMI but given profound volume depletion and poor p.o. intake this is likely reactive to supply/demand mismatch rather than true cardiac etiology.  Patient's AKI urine output and p.o. intake drastically improved over the past 24 hours.  Initially patient was to be evaluated by PT for further recommendations at discharge but family requesting discharge home with outpatient follow-up with PCP and initiation of PT through their office as they have done previously which is certainly reasonable.  Otherwise patient's transient episode of A-fib appears to be well controlled at this time, continue Xarelto.  Baseline dementia appears to be at baseline.  Discharge Diagnoses:  Principal Problem:   NSTEMI (non-ST elevated myocardial infarction) ([place]) Active Problems:   Recurrent falls   AKI (acute kidney injury) ([place])    Paroxysmal atrial fibrillation ([place])   Dyslipidemia   GERD   Dementia without behavioral disturbance ([place])   Vitamin B12 deficiency   Malnutrition of moderate degree    Discharge Instructions  Discharge Instructions     Discharge patient   Complete by: As directed    Discharge disposition: 01-Home or Self Care   Discharge patient date: 03/15/2021      Allergies as of 03/15/2021       Reactions   Atorvastatin    Other reaction(s): hot flashes; felt like walking/leaning to L side; constipation   Citalopram    Other reaction(s): felt it made the inside and outside of her skin burn.it is not clear if this is an allergy or just an adverse reaction of some kind.   Duloxetine Hcl    Other reaction(s): she can't tolerate it and wanted it listed as an allergy   Ezetimibe    Other reaction(s): "had problems with it"   Fluvastatin    Other reaction(s): made legs ache.   Gabapentin Other (See Comments)   hallucinations   Oxybutynin Chloride    Other reaction(s): throat swelling; burning eyelids; nausea   Pravastatin    Other reaction(s): "had problems with it"   Pregabalin    Other reaction(s): made her feel funny   Simvastatin    Other reaction(s): leg pain   Zolpidem Tartrate    Other reaction(s): hallucinations.        Medication List     STOP taking these medications    amoxicillin 500 MG capsule Commonly known as: AMOXIL       TAKE these medications    docusate sodium 100 MG capsule Commonly known as: COLACE Take 100 mg by mouth daily as needed for mild constipation.   donepezil 10 MG tablet Commonly known as: ARICEPT Take  Physician Discharge Summary  Christine [name] [id number] DOB: August 28, 1933 DOA: 03/13/2021  PCP: Crist Infante, MD  Admit date: 03/13/2021 Discharge date: 03/15/2021  Admitted From: Home Disposition: Home  Recommendations for Outpatient Follow-up:  Follow up with PCP in 1-2 weeks Please obtain BMP/CBC in one week  Discharge Condition: Stable CODE STATUS: DNR Diet recommendation: Regular diet as tolerated  Brief/Interim Summary: Christine [name] is a 88 y.o. female with medical history significant for essential hypertension, dyslipidemia, TIA, GERD, bilateral carotid artery stenosis, osteoarthritis and paroxysmal atrial fibrillation on Xarelto, presented to the emergency room at with acute onset of generalized fatigue and tiredness with recurrent falls. Notably symptoms are worse with standing, improved with laying down. Denies focal deficits, syncope, headache, fevers, chills, or chest pain.  Patient admitted with acute ambulatory dysfunction recurrent falls weakness failure to thrive poor p.o. intake dehydration rhabdomyolysis with minimally elevated troponin initially concerning for NSTEMI but given profound volume depletion and poor p.o. intake this is likely reactive to supply/demand mismatch rather than true cardiac etiology.  Patient's AKI urine output and p.o. intake drastically improved over the past 24 hours.  Initially patient was to be evaluated by PT for further recommendations at discharge but family requesting discharge home with outpatient follow-up with PCP and initiation of PT through their office as they have done previously which is certainly reasonable.  Otherwise patient's transient episode of A-fib appears to be well controlled at this time, continue Xarelto.  Baseline dementia appears to be at baseline.  Discharge Diagnoses:  Principal Problem:   NSTEMI (non-ST elevated myocardial infarction) ([place]) Active Problems:   Recurrent falls   AKI (acute kidney injury) ([place])    Paroxysmal atrial fibrillation ([place])   Dyslipidemia   GERD   Dementia without behavioral disturbance ([place])   Vitamin B12 deficiency   Malnutrition of moderate degree    Discharge Instructions  Discharge Instructions     Discharge patient   Complete by: As directed    Discharge disposition: 01-Home or Self Care   Discharge patient date: 03/15/2021      Allergies as of 03/15/2021       Reactions   Atorvastatin    Other reaction(s): hot flashes; felt like walking/leaning to L side; constipation   Citalopram    Other reaction(s): felt it made the inside and outside of her skin burn.it is not clear if this is an allergy or just an adverse reaction of some kind.   Duloxetine Hcl    Other reaction(s): she can't tolerate it and wanted it listed as an allergy   Ezetimibe    Other reaction(s): "had problems with it"   Fluvastatin    Other reaction(s): made legs ache.   Gabapentin Other (See Comments)   hallucinations   Oxybutynin Chloride    Other reaction(s): throat swelling; burning eyelids; nausea   Pravastatin    Other reaction(s): "had problems with it"   Pregabalin    Other reaction(s): made her feel funny   Simvastatin    Other reaction(s): leg pain   Zolpidem Tartrate    Other reaction(s): hallucinations.        Medication List     STOP taking these medications    amoxicillin 500 MG capsule Commonly known as: AMOXIL       TAKE these medications    docusate sodium 100 MG capsule Commonly known as: COLACE Take 100 mg by mouth daily as needed for mild constipation.   donepezil 10 MG tablet Commonly known as: ARICEPT Take  Physician Discharge Summary  Christine [name] [id number] DOB: August 28, 1933 DOA: 03/13/2021  PCP: Crist Infante, MD  Admit date: 03/13/2021 Discharge date: 03/15/2021  Admitted From: Home Disposition: Home  Recommendations for Outpatient Follow-up:  Follow up with PCP in 1-2 weeks Please obtain BMP/CBC in one week  Discharge Condition: Stable CODE STATUS: DNR Diet recommendation: Regular diet as tolerated  Brief/Interim Summary: Christine [name] is a 88 y.o. female with medical history significant for essential hypertension, dyslipidemia, TIA, GERD, bilateral carotid artery stenosis, osteoarthritis and paroxysmal atrial fibrillation on Xarelto, presented to the emergency room at with acute onset of generalized fatigue and tiredness with recurrent falls. Notably symptoms are worse with standing, improved with laying down. Denies focal deficits, syncope, headache, fevers, chills, or chest pain.  Patient admitted with acute ambulatory dysfunction recurrent falls weakness failure to thrive poor p.o. intake dehydration rhabdomyolysis with minimally elevated troponin initially concerning for NSTEMI but given profound volume depletion and poor p.o. intake this is likely reactive to supply/demand mismatch rather than true cardiac etiology.  Patient's AKI urine output and p.o. intake drastically improved over the past 24 hours.  Initially patient was to be evaluated by PT for further recommendations at discharge but family requesting discharge home with outpatient follow-up with PCP and initiation of PT through their office as they have done previously which is certainly reasonable.  Otherwise patient's transient episode of A-fib appears to be well controlled at this time, continue Xarelto.  Baseline dementia appears to be at baseline.  Discharge Diagnoses:  Principal Problem:   NSTEMI (non-ST elevated myocardial infarction) ([place]) Active Problems:   Recurrent falls   AKI (acute kidney injury) ([place])    Paroxysmal atrial fibrillation ([place])   Dyslipidemia   GERD   Dementia without behavioral disturbance ([place])   Vitamin B12 deficiency   Malnutrition of moderate degree    Discharge Instructions  Discharge Instructions     Discharge patient   Complete by: As directed    Discharge disposition: 01-Home or Self Care   Discharge patient date: 03/15/2021      Allergies as of 03/15/2021       Reactions   Atorvastatin    Other reaction(s): hot flashes; felt like walking/leaning to L side; constipation   Citalopram    Other reaction(s): felt it made the inside and outside of her skin burn.it is not clear if this is an allergy or just an adverse reaction of some kind.   Duloxetine Hcl    Other reaction(s): she can't tolerate it and wanted it listed as an allergy   Ezetimibe    Other reaction(s): "had problems with it"   Fluvastatin    Other reaction(s): made legs ache.   Gabapentin Other (See Comments)   hallucinations   Oxybutynin Chloride    Other reaction(s): throat swelling; burning eyelids; nausea   Pravastatin    Other reaction(s): "had problems with it"   Pregabalin    Other reaction(s): made her feel funny   Simvastatin    Other reaction(s): leg pain   Zolpidem Tartrate    Other reaction(s): hallucinations.        Medication List     STOP taking these medications    amoxicillin 500 MG capsule Commonly known as: AMOXIL       TAKE these medications    docusate sodium 100 MG capsule Commonly known as: COLACE Take 100 mg by mouth daily as needed for mild constipation.   donepezil 10 MG tablet Commonly known as: ARICEPT Take  lesion. Sinuses/Orbits: No acute finding. Status post bilateral lens replacements. Other: Fluid in left mastoid tip. IMPRESSION: IMPRESSION No acute intracranial process. Electronically Signed   By: Merilyn Baba M.D.   On: 03/13/2021 17:42   CT Lumbar Spine Wo Contrast  Result Date: 03/13/2021 CLINICAL DATA:  Fall with back pain EXAM: CT LUMBAR SPINE WITHOUT CONTRAST TECHNIQUE: Multidetector CT imaging of the lumbar spine was performed without intravenous contrast administration. Multiplanar CT image reconstructions were also generated. RADIATION DOSE REDUCTION: This exam was performed according to the departmental dose-optimization program which includes automated exposure control, adjustment of the mA and/or kV according to patient size and/or use of iterative reconstruction technique. COMPARISON:  MRI 08/12/2015 FINDINGS: Segmentation: 5  lumbar type vertebrae. Alignment: Normal. Vertebrae: No acute fracture or focal pathologic process. Paraspinal and other soft tissues: Aortic atherosclerosis. Probable parapelvic renal cysts. No paravertebral or paraspinal soft tissue abnormality Disc levels: Mild diffuse disc bulge at L3-L4 with mild canal stenosis. Facet degenerative changes. No foraminal narrowing. Marked disc space narrowing at L4-L5. Diffuse disc bulge. Mild canal stenosis. Moderate facet degenerative changes with mild bilateral foraminal narrowing. No significant canal stenosis at L5-S1. Facet degenerative changes without significant foraminal narrowing. IMPRESSION: 1. No acute osseous abnormality. 2. Multilevel degenerative changes without high-grade canal stenosis. Electronically Signed   By: Donavan Foil M.D.   On: 03/13/2021 17:54   DG Chest Portable 1 View  Result Date: 03/13/2021 CLINICAL DATA:  Chest discomfort EXAM: PORTABLE CHEST 1 VIEW COMPARISON:  09/30/2015, 05/28/2016 FINDINGS: The heart size and mediastinal contours are within normal limits. Both lungs are clear. The visualized skeletal structures are unremarkable. Mild scarring or atelectasis at the bases. Probable skin fold artifact over the left upper thorax IMPRESSION: Probable skin fold artifact over the left upper thorax. Mild scarring or atelectasis at the bases Electronically Signed   By: Donavan Foil M.D.   On: 03/13/2021 17:16   ECHOCARDIOGRAM COMPLETE  Result Date: 03/14/2021    ECHOCARDIOGRAM REPORT   Patient Name:   Christine [name] Day Date of Exam: 03/14/2021 Medical Rec #:  [id number]       Height:       64.0 in Accession #:    [id number]      Weight:       140.0 lb Date of Birth:  1933-07-23       BSA:          1.681 m Patient Age:    84 years        BP:           99/57 mmHg Patient Gender: F               HR:           61 bpm. Exam Location:  Inpatient Procedure: 2D Echo Indications:    Elevated troponin  History:        Patient has prior history of  Echocardiogram examinations, most                 recent 07/17/2013. TIA, Arrythmias:Atrial Fibrillation; Risk                 Factors:Dyslipidemia and Hypertension.  Sonographer:    Arlyss [place] Referring Phys: [id number] JAN A [place]  1. Left ventricular ejection fraction, by estimation, is 45%. The left ventricle has mildly decreased function. The left ventricle demonstrates regional wall motion abnormalities (suggestive of LAD disease). There is mild asymmetric left ventricular hypertrophy. Left ventricular diastolic parameters are  Physician Discharge Summary  Christine [name] UDJ:[id number] DOB: August 28, 1933 DOA: 03/13/2021  PCP: Crist Infante, MD  Admit date: 03/13/2021 Discharge date: 03/15/2021  Admitted From: Home Disposition: Home  Recommendations for Outpatient Follow-up:  Follow up with PCP in 1-2 weeks Please obtain BMP/CBC in one week  Discharge Condition: Stable CODE STATUS: DNR Diet recommendation: Regular diet as tolerated  Brief/Interim Summary: Christine [name] is a 88 y.o. female with medical history significant for essential hypertension, dyslipidemia, TIA, GERD, bilateral carotid artery stenosis, osteoarthritis and paroxysmal atrial fibrillation on Xarelto, presented to the emergency room at with acute onset of generalized fatigue and tiredness with recurrent falls. Notably symptoms are worse with standing, improved with laying down. Denies focal deficits, syncope, headache, fevers, chills, or chest pain.  Patient admitted with acute ambulatory dysfunction recurrent falls weakness failure to thrive poor p.o. intake dehydration rhabdomyolysis with minimally elevated troponin initially concerning for NSTEMI but given profound volume depletion and poor p.o. intake this is likely reactive to supply/demand mismatch rather than true cardiac etiology.  Patient's AKI urine output and p.o. intake drastically improved over the past 24 hours.  Initially patient was to be evaluated by PT for further recommendations at discharge but family requesting discharge home with outpatient follow-up with PCP and initiation of PT through their office as they have done previously which is certainly reasonable.  Otherwise patient's transient episode of A-fib appears to be well controlled at this time, continue Xarelto.  Baseline dementia appears to be at baseline.  Discharge Diagnoses:  Principal Problem:   NSTEMI (non-ST elevated myocardial infarction) ([place]) Active Problems:   Recurrent falls   AKI (acute kidney injury) ([place])    Paroxysmal atrial fibrillation ([place])   Dyslipidemia   GERD   Dementia without behavioral disturbance ([place])   Vitamin B12 deficiency   Malnutrition of moderate degree    Discharge Instructions  Discharge Instructions     Discharge patient   Complete by: As directed    Discharge disposition: 01-Home or Self Care   Discharge patient date: 03/15/2021      Allergies as of 03/15/2021       Reactions   Atorvastatin    Other reaction(s): hot flashes; felt like walking/leaning to L side; constipation   Citalopram    Other reaction(s): felt it made the inside and outside of her skin burn.it is not clear if this is an allergy or just an adverse reaction of some kind.   Duloxetine Hcl    Other reaction(s): she can't tolerate it and wanted it listed as an allergy   Ezetimibe    Other reaction(s): "had problems with it"   Fluvastatin    Other reaction(s): made legs ache.   Gabapentin Other (See Comments)   hallucinations   Oxybutynin Chloride    Other reaction(s): throat swelling; burning eyelids; nausea   Pravastatin    Other reaction(s): "had problems with it"   Pregabalin    Other reaction(s): made her feel funny   Simvastatin    Other reaction(s): leg pain   Zolpidem Tartrate    Other reaction(s): hallucinations.        Medication List     STOP taking these medications    amoxicillin 500 MG capsule Commonly known as: AMOXIL       TAKE these medications    docusate sodium 100 MG capsule Commonly known as: COLACE Take 100 mg by mouth daily as needed for mild constipation.   donepezil 10 MG tablet Commonly known as: ARICEPT Take

## 2021-03-15 NOTE — Progress Notes (Signed)
Pt.has not voided since yesterday .bladder scan done & obtained 161 cc.only ?

## 2021-03-15 NOTE — Progress Notes (Signed)
Pt safely discharged. Discharge provided with teach-back method to Pt and daughter. VS wnL and as per flow. IVs removed, Pt verbalized understanding. All questions and concerns addressed. Daughter for transport.  ?

## 2021-03-19 LAB — CK ISOENZYMES
CK-BB: 0 %
CK-MB: 0 % (ref 0–3)
CK-MM: 100 % (ref 97–100)
Creatine Kinase-Total: 763 U/L — ABNORMAL HIGH (ref 26–161)
Macro Type 1: 0 %
Macro Type 2: 0 %

## 2021-03-20 DIAGNOSIS — R296 Repeated falls: Secondary | ICD-10-CM | POA: Diagnosis not present

## 2021-03-20 DIAGNOSIS — Z7189 Other specified counseling: Secondary | ICD-10-CM | POA: Diagnosis not present

## 2021-03-20 DIAGNOSIS — E785 Hyperlipidemia, unspecified: Secondary | ICD-10-CM | POA: Diagnosis not present

## 2021-03-20 DIAGNOSIS — I480 Paroxysmal atrial fibrillation: Secondary | ICD-10-CM | POA: Diagnosis not present

## 2021-03-20 DIAGNOSIS — R413 Other amnesia: Secondary | ICD-10-CM | POA: Diagnosis not present

## 2021-03-20 DIAGNOSIS — T796XXA Traumatic ischemia of muscle, initial encounter: Secondary | ICD-10-CM | POA: Diagnosis not present

## 2021-03-20 DIAGNOSIS — I10 Essential (primary) hypertension: Secondary | ICD-10-CM | POA: Diagnosis not present

## 2021-03-20 DIAGNOSIS — N179 Acute kidney failure, unspecified: Secondary | ICD-10-CM | POA: Diagnosis not present

## 2021-03-20 DIAGNOSIS — M545 Low back pain, unspecified: Secondary | ICD-10-CM | POA: Diagnosis not present

## 2021-04-05 ENCOUNTER — Other Ambulatory Visit (HOSPITAL_COMMUNITY): Payer: Self-pay

## 2021-04-17 DIAGNOSIS — Z125 Encounter for screening for malignant neoplasm of prostate: Secondary | ICD-10-CM | POA: Diagnosis not present

## 2021-04-17 DIAGNOSIS — E559 Vitamin D deficiency, unspecified: Secondary | ICD-10-CM | POA: Diagnosis not present

## 2021-04-17 DIAGNOSIS — I10 Essential (primary) hypertension: Secondary | ICD-10-CM | POA: Diagnosis not present

## 2021-04-17 DIAGNOSIS — E785 Hyperlipidemia, unspecified: Secondary | ICD-10-CM | POA: Diagnosis not present

## 2021-04-24 DIAGNOSIS — F0390 Unspecified dementia without behavioral disturbance: Secondary | ICD-10-CM | POA: Diagnosis not present

## 2021-04-24 DIAGNOSIS — M810 Age-related osteoporosis without current pathological fracture: Secondary | ICD-10-CM | POA: Diagnosis not present

## 2021-04-24 DIAGNOSIS — G459 Transient cerebral ischemic attack, unspecified: Secondary | ICD-10-CM | POA: Diagnosis not present

## 2021-04-24 DIAGNOSIS — D6869 Other thrombophilia: Secondary | ICD-10-CM | POA: Diagnosis not present

## 2021-04-24 DIAGNOSIS — Z0000 Encounter for general adult medical examination without abnormal findings: Secondary | ICD-10-CM | POA: Diagnosis not present

## 2021-04-24 DIAGNOSIS — N3010 Interstitial cystitis (chronic) without hematuria: Secondary | ICD-10-CM | POA: Diagnosis not present

## 2021-04-24 DIAGNOSIS — I10 Essential (primary) hypertension: Secondary | ICD-10-CM | POA: Diagnosis not present

## 2021-04-24 DIAGNOSIS — E785 Hyperlipidemia, unspecified: Secondary | ICD-10-CM | POA: Diagnosis not present

## 2021-04-24 DIAGNOSIS — R82998 Other abnormal findings in urine: Secondary | ICD-10-CM | POA: Diagnosis not present

## 2021-04-24 DIAGNOSIS — Z1389 Encounter for screening for other disorder: Secondary | ICD-10-CM | POA: Diagnosis not present

## 2021-04-24 DIAGNOSIS — H6123 Impacted cerumen, bilateral: Secondary | ICD-10-CM | POA: Diagnosis not present

## 2021-04-24 DIAGNOSIS — D692 Other nonthrombocytopenic purpura: Secondary | ICD-10-CM | POA: Diagnosis not present

## 2021-04-24 DIAGNOSIS — Z23 Encounter for immunization: Secondary | ICD-10-CM | POA: Diagnosis not present

## 2021-04-24 DIAGNOSIS — Z1331 Encounter for screening for depression: Secondary | ICD-10-CM | POA: Diagnosis not present

## 2021-04-24 DIAGNOSIS — E538 Deficiency of other specified B group vitamins: Secondary | ICD-10-CM | POA: Diagnosis not present

## 2021-05-09 ENCOUNTER — Other Ambulatory Visit: Payer: Self-pay

## 2021-05-12 ENCOUNTER — Ambulatory Visit: Payer: PPO | Admitting: Podiatry

## 2021-05-12 ENCOUNTER — Encounter: Payer: Self-pay | Admitting: Podiatry

## 2021-05-12 ENCOUNTER — Telehealth: Payer: Self-pay | Admitting: Pharmacy Technician

## 2021-05-12 DIAGNOSIS — M79675 Pain in left toe(s): Secondary | ICD-10-CM | POA: Diagnosis not present

## 2021-05-12 DIAGNOSIS — B351 Tinea unguium: Secondary | ICD-10-CM

## 2021-05-12 DIAGNOSIS — M79674 Pain in right toe(s): Secondary | ICD-10-CM

## 2021-05-12 NOTE — Telephone Encounter (Addendum)
Auth Submission: no auth needed Payer: [employer] Medication & CPT/J Code(s) submitted: Prolia (Denosumab) [id number] Route of submission (phone, fax, portal): phone: [phone] Auth type: Buy/Bill Units/visits requested: x1 dose Reference number:  [id number] 07/09/21 @ 9:30 REF: [id number] Approval from: 07/09/21 to 01/04/22  Patient is responsible for 20% co-insurance

## 2021-05-20 NOTE — Progress Notes (Signed)
?  Subjective:  ?Patient ID: Christine [name], female    DOB: [birth year],  MRN: [id number] ? ?Christine [name] presents to clinic today for painful elongated mycotic toenails 1-5 bilaterally which are tender when wearing enclosed shoe gear. Pain is relieved with periodic professional debridement. ? ?New problem(s): None.  ? ?PCP is Rodrigo Ran, MD , and last visit was October 01, 2020. ? ?Allergies  ?Allergen Reactions  ? Atorvastatin   ?  Other reaction(s): hot flashes; felt like walking/leaning to L side; constipation  ? Citalopram   ?  Other reaction(s): felt it made the inside and outside of her skin burn.it is not clear if this is an allergy or just an adverse reaction of some kind.  ? Duloxetine Hcl   ?  Other reaction(s): she can't tolerate it and wanted it listed as an allergy  ? Ezetimibe   ?  Other reaction(s): "had problems with it"  ? Fluvastatin   ?  Other reaction(s): made legs ache.  ? Gabapentin Other (See Comments)  ?  hallucinations  ? Oxybutynin Chloride   ?  Other reaction(s): throat swelling; burning eyelids; nausea  ? Pravastatin   ?  Other reaction(s): "had problems with it"  ? Pregabalin   ?  Other reaction(s): made her feel funny  ? Simvastatin   ?  Other reaction(s): leg pain  ? Zolpidem Tartrate   ?  Other reaction(s): hallucinations.  ? ? ?Review of Systems: Negative except as noted in the HPI. ? ?Objective: No changes noted in today's physical examination. ?Vascular Examination: ?Vascular status intact b/l with palpable pedal pulses. CFT immediate b/l. No edema. No pain with calf compression b/l. Skin temperature gradient WNL b/l. Pedal hair present. No edema noted b/l lower extremities. ? ?Neurological Examination: ?Sensation grossly intact b/l with 10 gram monofilament. Vibratory sensation intact b/l.  ? ?Dermatological Examination: ?Pedal skin with normal turgor, texture and tone b/l. Toenails 1-5 b/l thick, discolored, elongated with subungual debris and pain on dorsal palpation. No  hyperkeratotic/porokeratotic lesions noted b/l.  ? ?Musculoskeletal Examination: ?Muscle strength 5/5 to b/l LE. Hallux valgus with bunion deformity noted b/l lower extremities.  ? ?Radiographs: None ? ?Assessment/Plan: ?1. Pain due to onychomycosis of toenails of both feet   ?  ? ?-Patient was evaluated and treated. All patient's and/or POA's questions/concerns answered on today's visit. ?-Patient to continue soft, supportive shoe gear daily. ?-Mycotic toenails 1-5 bilaterally were debrided in length and girth with sterile nail nippers and dremel without incident. ?-Patient/POA to call should there be question/concern in the interim.  ? ?Return in about 3 months (around 08/12/2021). ? ?Freddie Breech, DPM  ?

## 2021-06-04 DIAGNOSIS — I480 Paroxysmal atrial fibrillation: Secondary | ICD-10-CM | POA: Diagnosis not present

## 2021-06-04 DIAGNOSIS — I10 Essential (primary) hypertension: Secondary | ICD-10-CM | POA: Diagnosis not present

## 2021-06-04 DIAGNOSIS — E785 Hyperlipidemia, unspecified: Secondary | ICD-10-CM | POA: Diagnosis not present

## 2021-06-05 DIAGNOSIS — L821 Other seborrheic keratosis: Secondary | ICD-10-CM | POA: Diagnosis not present

## 2021-06-05 DIAGNOSIS — Z85828 Personal history of other malignant neoplasm of skin: Secondary | ICD-10-CM | POA: Diagnosis not present

## 2021-06-05 DIAGNOSIS — L570 Actinic keratosis: Secondary | ICD-10-CM | POA: Diagnosis not present

## 2021-07-09 ENCOUNTER — Encounter: Payer: Self-pay | Admitting: Pulmonary Disease

## 2021-07-16 ENCOUNTER — Encounter: Payer: Self-pay | Admitting: Pulmonary Disease

## 2021-07-21 ENCOUNTER — Ambulatory Visit (INDEPENDENT_AMBULATORY_CARE_PROVIDER_SITE_OTHER): Payer: PPO | Admitting: *Deleted

## 2021-07-21 VITALS — BP 138/75 | HR 83 | Temp 98.3°F | Resp 18 | Ht 64.0 in | Wt 124.2 lb

## 2021-07-21 DIAGNOSIS — M810 Age-related osteoporosis without current pathological fracture: Secondary | ICD-10-CM | POA: Diagnosis not present

## 2021-07-21 MED ORDER — DENOSUMAB 60 MG/ML ~~LOC~~ SOSY
60.0000 mg | PREFILLED_SYRINGE | Freq: Once | SUBCUTANEOUS | Status: AC
  Administered 2021-07-21: 60 mg via SUBCUTANEOUS
  Filled 2021-07-21: qty 1

## 2021-07-21 NOTE — Progress Notes (Signed)
Diagnosis: Osteoporosis  Provider:  Marshell Garfinkel, MD  Procedure: Injection  Prolia (Denosumab), Dose: 60 mg, Site: subcutaneous, Number of injections: 1  Discharge: Condition: Good, Destination: Home . AVS provided to patient.   Performed by:  Oren Beckmann, RN

## 2021-08-15 ENCOUNTER — Ambulatory Visit: Payer: PPO | Admitting: Podiatry

## 2021-08-20 ENCOUNTER — Encounter: Payer: Self-pay | Admitting: Podiatry

## 2021-08-20 ENCOUNTER — Ambulatory Visit: Payer: PPO | Admitting: Podiatry

## 2021-08-20 DIAGNOSIS — B351 Tinea unguium: Secondary | ICD-10-CM | POA: Diagnosis not present

## 2021-08-20 DIAGNOSIS — M79675 Pain in left toe(s): Secondary | ICD-10-CM | POA: Diagnosis not present

## 2021-08-20 DIAGNOSIS — M79674 Pain in right toe(s): Secondary | ICD-10-CM | POA: Diagnosis not present

## 2021-08-25 NOTE — Progress Notes (Signed)
Subjective:  Patient ID: Christine [name], female    DOB: 1933-03-19,  MRN: [id number]  Christine [name] presents to clinic today for painful elongated mycotic toenails 1-5 bilaterally which are tender when wearing enclosed shoe gear. Pain is relieved with periodic professional debridement.  Patient is accompanied by her daughter on today's visit.  New problem(s): None.   PCP is Rodrigo Ran, MD , and last visit was April 24, 2021.  Allergies  Allergen Reactions   Atorvastatin     Other reaction(s): hot flashes; felt like walking/leaning to L side; constipation   Citalopram     Other reaction(s): felt it made the inside and outside of her skin burn.it is not clear if this is an allergy or just an adverse reaction of some kind.   Duloxetine Hcl     Other reaction(s): she can't tolerate it and wanted it listed as an allergy   Ezetimibe     Other reaction(s): "had problems with it"   Fluvastatin     Other reaction(s): made legs ache.   Gabapentin Other (See Comments)    hallucinations   Oxybutynin Chloride     Other reaction(s): throat swelling; burning eyelids; nausea   Pravastatin     Other reaction(s): "had problems with it"   Pregabalin     Other reaction(s): made her feel funny   Simvastatin     Other reaction(s): leg pain   Zolpidem Tartrate     Other reaction(s): hallucinations.    Review of Systems: Negative except as noted in the HPI.  Objective: No changes noted in today's physical examination.  Vascular Examination: Palpable pedal pulses b/l LE. Digital hair present b/l. No pedal edema b/l. Skin temperature gradient WNL b/l. No varicosities b/l. Marland Kitchen  Dermatological Examination: Pedal skin with normal turgor, texture and tone b/l. No open wounds. No interdigital macerations b/l. Toenails 1-5 b/l thickened, discolored, dystrophic with subungual debris. There is pain on palpation to dorsal aspect of nailplates. No hyperkeratotic nor porokeratotic lesions present on  today's visit.Marland Kitchen  Neurological Examination: Protective sensation intact with 10 gram monofilament b/l LE. Vibratory sensation intact b/l LE.   Musculoskeletal Examination: Muscle strength 5/5 to all LE muscle groups b/l. HAV with bunion deformity noted b/l LE.  Assessment/Plan: 1. Pain due to onychomycosis of toenails of both feet     -Patient's family member present. All questions/concerns addressed on today's visit. -Examined patient. -Continue foot and shoe inspections daily. Monitor blood glucose per PCP/Endocrinologist's recommendations. -Patient to continue soft, supportive shoe gear daily. -Toenails 1-5 b/l were debrided in length and girth with sterile nail nippers and dremel without iatrogenic bleeding.  -Patient/POA to call should there be question/concern in the interim.   Return in about 3 months (around 11/20/2021).  Freddie Breech, DPM

## 2021-09-16 DIAGNOSIS — E785 Hyperlipidemia, unspecified: Secondary | ICD-10-CM | POA: Diagnosis not present

## 2021-10-20 DIAGNOSIS — R350 Frequency of micturition: Secondary | ICD-10-CM | POA: Diagnosis not present

## 2021-10-20 DIAGNOSIS — R351 Nocturia: Secondary | ICD-10-CM | POA: Diagnosis not present

## 2021-10-20 DIAGNOSIS — Z87442 Personal history of urinary calculi: Secondary | ICD-10-CM | POA: Diagnosis not present

## 2021-10-20 DIAGNOSIS — N3010 Interstitial cystitis (chronic) without hematuria: Secondary | ICD-10-CM | POA: Diagnosis not present

## 2021-10-20 DIAGNOSIS — R102 Pelvic and perineal pain: Secondary | ICD-10-CM | POA: Diagnosis not present

## 2021-11-26 DIAGNOSIS — I480 Paroxysmal atrial fibrillation: Secondary | ICD-10-CM | POA: Diagnosis not present

## 2021-11-26 DIAGNOSIS — F0390 Unspecified dementia without behavioral disturbance: Secondary | ICD-10-CM | POA: Diagnosis not present

## 2021-11-26 DIAGNOSIS — D649 Anemia, unspecified: Secondary | ICD-10-CM | POA: Diagnosis not present

## 2021-11-26 DIAGNOSIS — E785 Hyperlipidemia, unspecified: Secondary | ICD-10-CM | POA: Diagnosis not present

## 2021-11-26 DIAGNOSIS — G459 Transient cerebral ischemic attack, unspecified: Secondary | ICD-10-CM | POA: Diagnosis not present

## 2021-11-26 DIAGNOSIS — D692 Other nonthrombocytopenic purpura: Secondary | ICD-10-CM | POA: Diagnosis not present

## 2021-11-26 DIAGNOSIS — G894 Chronic pain syndrome: Secondary | ICD-10-CM | POA: Diagnosis not present

## 2021-11-26 DIAGNOSIS — D6869 Other thrombophilia: Secondary | ICD-10-CM | POA: Diagnosis not present

## 2021-11-26 DIAGNOSIS — M810 Age-related osteoporosis without current pathological fracture: Secondary | ICD-10-CM | POA: Diagnosis not present

## 2021-12-17 DIAGNOSIS — L821 Other seborrheic keratosis: Secondary | ICD-10-CM | POA: Diagnosis not present

## 2021-12-17 DIAGNOSIS — Z85828 Personal history of other malignant neoplasm of skin: Secondary | ICD-10-CM | POA: Diagnosis not present

## 2021-12-17 DIAGNOSIS — B078 Other viral warts: Secondary | ICD-10-CM | POA: Diagnosis not present

## 2021-12-17 DIAGNOSIS — L570 Actinic keratosis: Secondary | ICD-10-CM | POA: Diagnosis not present

## 2022-01-21 ENCOUNTER — Other Ambulatory Visit (HOSPITAL_COMMUNITY): Payer: Self-pay | Admitting: *Deleted

## 2022-01-22 ENCOUNTER — Ambulatory Visit (HOSPITAL_COMMUNITY)
Admission: RE | Admit: 2022-01-22 | Discharge: 2022-01-22 | Disposition: A | Discharge status: Home / Self Care | Payer: PPO | Source: Ambulatory Visit | Attending: Internal Medicine | Admitting: Internal Medicine

## 2022-01-22 DIAGNOSIS — M810 Age-related osteoporosis without current pathological fracture: Secondary | ICD-10-CM | POA: Insufficient documentation

## 2022-01-22 MED ORDER — DENOSUMAB 60 MG/ML ~~LOC~~ SOSY
PREFILLED_SYRINGE | SUBCUTANEOUS | Status: AC
  Filled 2022-01-22: qty 1

## 2022-01-22 MED ORDER — DENOSUMAB 60 MG/ML ~~LOC~~ SOSY
60.0000 mg | PREFILLED_SYRINGE | Freq: Once | SUBCUTANEOUS | Status: AC
  Administered 2022-01-22: 60 mg via SUBCUTANEOUS

## 2022-01-29 ENCOUNTER — Emergency Department (HOSPITAL_COMMUNITY): Payer: PPO

## 2022-01-29 ENCOUNTER — Encounter (HOSPITAL_COMMUNITY): Payer: Self-pay | Admitting: Emergency Medicine

## 2022-01-29 ENCOUNTER — Other Ambulatory Visit: Payer: Self-pay

## 2022-01-29 ENCOUNTER — Inpatient Hospital Stay (HOSPITAL_COMMUNITY)
Admission: EM | Admit: 2022-01-29 | Discharge: 2022-01-31 | DRG: 640 | Disposition: A | Discharge status: Home Health | Payer: PPO | Attending: Internal Medicine | Admitting: Internal Medicine

## 2022-01-29 DIAGNOSIS — E861 Hypovolemia: Secondary | ICD-10-CM | POA: Diagnosis present

## 2022-01-29 DIAGNOSIS — E785 Hyperlipidemia, unspecified: Secondary | ICD-10-CM | POA: Diagnosis present

## 2022-01-29 DIAGNOSIS — I6523 Occlusion and stenosis of bilateral carotid arteries: Secondary | ICD-10-CM | POA: Diagnosis present

## 2022-01-29 DIAGNOSIS — Z79899 Other long term (current) drug therapy: Secondary | ICD-10-CM | POA: Diagnosis not present

## 2022-01-29 DIAGNOSIS — M8580 Other specified disorders of bone density and structure, unspecified site: Secondary | ICD-10-CM | POA: Diagnosis present

## 2022-01-29 DIAGNOSIS — Z85828 Personal history of other malignant neoplasm of skin: Secondary | ICD-10-CM | POA: Diagnosis not present

## 2022-01-29 DIAGNOSIS — Z7901 Long term (current) use of anticoagulants: Secondary | ICD-10-CM | POA: Diagnosis not present

## 2022-01-29 DIAGNOSIS — Z66 Do not resuscitate: Secondary | ICD-10-CM | POA: Diagnosis present

## 2022-01-29 DIAGNOSIS — E872 Acidosis, unspecified: Secondary | ICD-10-CM | POA: Diagnosis present

## 2022-01-29 DIAGNOSIS — R000 Tachycardia, unspecified: Secondary | ICD-10-CM | POA: Diagnosis not present

## 2022-01-29 DIAGNOSIS — R55 Syncope and collapse: Secondary | ICD-10-CM | POA: Diagnosis present

## 2022-01-29 DIAGNOSIS — I4891 Unspecified atrial fibrillation: Principal | ICD-10-CM

## 2022-01-29 DIAGNOSIS — Z888 Allergy status to other drugs, medicaments and biological substances status: Secondary | ICD-10-CM | POA: Diagnosis not present

## 2022-01-29 DIAGNOSIS — E876 Hypokalemia: Principal | ICD-10-CM | POA: Diagnosis present

## 2022-01-29 DIAGNOSIS — K219 Gastro-esophageal reflux disease without esophagitis: Secondary | ICD-10-CM | POA: Diagnosis present

## 2022-01-29 DIAGNOSIS — D6959 Other secondary thrombocytopenia: Secondary | ICD-10-CM | POA: Diagnosis not present

## 2022-01-29 DIAGNOSIS — Z96642 Presence of left artificial hip joint: Secondary | ICD-10-CM | POA: Diagnosis present

## 2022-01-29 DIAGNOSIS — F03A4 Unspecified dementia, mild, with anxiety: Secondary | ICD-10-CM | POA: Diagnosis present

## 2022-01-29 DIAGNOSIS — L821 Other seborrheic keratosis: Secondary | ICD-10-CM | POA: Diagnosis not present

## 2022-01-29 DIAGNOSIS — I480 Paroxysmal atrial fibrillation: Secondary | ICD-10-CM | POA: Diagnosis present

## 2022-01-29 DIAGNOSIS — E860 Dehydration: Secondary | ICD-10-CM | POA: Diagnosis not present

## 2022-01-29 DIAGNOSIS — R110 Nausea: Secondary | ICD-10-CM | POA: Diagnosis not present

## 2022-01-29 DIAGNOSIS — I5042 Chronic combined systolic (congestive) and diastolic (congestive) heart failure: Secondary | ICD-10-CM | POA: Diagnosis present

## 2022-01-29 DIAGNOSIS — M1990 Unspecified osteoarthritis, unspecified site: Secondary | ICD-10-CM | POA: Diagnosis present

## 2022-01-29 DIAGNOSIS — D61818 Other pancytopenia: Secondary | ICD-10-CM | POA: Diagnosis present

## 2022-01-29 DIAGNOSIS — U071 COVID-19: Secondary | ICD-10-CM | POA: Diagnosis present

## 2022-01-29 DIAGNOSIS — I110 Hypertensive heart disease with heart failure: Secondary | ICD-10-CM | POA: Diagnosis present

## 2022-01-29 DIAGNOSIS — I499 Cardiac arrhythmia, unspecified: Secondary | ICD-10-CM | POA: Diagnosis not present

## 2022-01-29 DIAGNOSIS — L570 Actinic keratosis: Secondary | ICD-10-CM | POA: Diagnosis not present

## 2022-01-29 DIAGNOSIS — Z8673 Personal history of transient ischemic attack (TIA), and cerebral infarction without residual deficits: Secondary | ICD-10-CM

## 2022-01-29 DIAGNOSIS — R1084 Generalized abdominal pain: Secondary | ICD-10-CM | POA: Diagnosis not present

## 2022-01-29 LAB — PHOSPHORUS: Phosphorus: 3.4 mg/dL (ref 2.5–4.6)

## 2022-01-29 LAB — CBC WITH DIFFERENTIAL/PLATELET
Abs Immature Granulocytes: 0.01 10*3/uL (ref 0.00–0.07)
Basophils Absolute: 0 10*3/uL (ref 0.0–0.1)
Basophils Relative: 0 %
Eosinophils Absolute: 0 10*3/uL (ref 0.0–0.5)
Eosinophils Relative: 0 %
HCT: 35.7 % — ABNORMAL LOW (ref 36.0–46.0)
Hemoglobin: 11.4 g/dL — ABNORMAL LOW (ref 12.0–15.0)
Immature Granulocytes: 0 %
Lymphocytes Relative: 21 %
Lymphs Abs: 0.5 10*3/uL — ABNORMAL LOW (ref 0.7–4.0)
MCH: 29.5 pg (ref 26.0–34.0)
MCHC: 31.9 g/dL (ref 30.0–36.0)
MCV: 92.2 fL (ref 80.0–100.0)
Monocytes Absolute: 0.5 10*3/uL (ref 0.1–1.0)
Monocytes Relative: 22 %
Neutro Abs: 1.4 10*3/uL — ABNORMAL LOW (ref 1.7–7.7)
Neutrophils Relative %: 57 %
Platelets: 77 10*3/uL — ABNORMAL LOW (ref 150–400)
RBC: 3.87 MIL/uL (ref 3.87–5.11)
RDW: 16 % — ABNORMAL HIGH (ref 11.5–15.5)
WBC: 2.4 10*3/uL — ABNORMAL LOW (ref 4.0–10.5)
nRBC: 0 % (ref 0.0–0.2)

## 2022-01-29 LAB — TROPONIN I (HIGH SENSITIVITY): Troponin I (High Sensitivity): 18 ng/L — ABNORMAL HIGH (ref <18)

## 2022-01-29 LAB — MAGNESIUM: Magnesium: 1.8 mg/dL (ref 1.7–2.4)

## 2022-01-29 LAB — BASIC METABOLIC PANEL
Anion gap: 11 (ref 5–15)
BUN: 21 mg/dL (ref 8–23)
CO2: 19 mmol/L — ABNORMAL LOW (ref 22–32)
Calcium: 8.1 mg/dL — ABNORMAL LOW (ref 8.9–10.3)
Chloride: 105 mmol/L (ref 98–111)
Creatinine, Ser: 1.38 mg/dL — ABNORMAL HIGH (ref 0.44–1.00)
GFR, Estimated: 37 mL/min — ABNORMAL LOW (ref >60)
Glucose, Bld: 118 mg/dL — ABNORMAL HIGH (ref 70–99)
Potassium: 3.6 mmol/L (ref 3.5–5.1)
Sodium: 135 mmol/L (ref 135–145)

## 2022-01-29 MED ORDER — LACTATED RINGERS IV SOLN: INTRAVENOUS | Status: DC

## 2022-01-29 NOTE — ED Triage Notes (Signed)
Pt BIB EMS from home, reports pt standing at counter to take evening meds when pt had syncopal episode and legs gave out. States that family caught pt and she did not fall. Pt unresponsive for approx. 1 minute. Denies CP, ShOB. C/o Abd pain on EMS arrival, denies pain now. Found to be in a-fib at 110-150's no prior hx of a-fib. Given 500cc of fluids by EMS.

## 2022-01-29 NOTE — ED Provider Notes (Signed)
[facility] Provider Note   CSN: [id number] Arrival date & time: 01/29/22  2145     History  Chief Complaint  Patient presents with   Loss of Consciousness    LEANDA PADMORE is a 89 y.o. female.  AUNISTY REALI is an 89 year old female past medical history of PAF on Xarelto, CHF EF 45% 03/2021, osteoarthritis, carotid artery stenosis presenting to the emergency department for an episode of near syncope.  Patient's daughter provides most of the history as she states that her mother lives at home independently and normally is self-sufficient with her activities of daily living.  Per the patient's daughter she states that she went over to provide her mother with her medications.  When her mother answered the door she was diaphoretic and appeared "spaced out".  Upon entering her mother's house she noted that her mother was less responsive than she usually is and then had an episode in which she started singing to her knees.  Daughter denies that she fully lost consciousness and she was able to catch her mom before she fell to the ground.  She states that her mother was out for approximately 30 seconds and rapidly returned to her baseline upon awakening.  Patient states that she has not been eating or drinking as much over the past several days she denies chest pain or shortness of breath.  Her daughter states that she has spent considerable time with her mother over the past several days and denies any worsening dyspnea on exertion.  The daughter does endorse that her mother has very poor oral intake.  She states that her mother was complaining of minor abdominal pain earlier in the day but the patient denies any abdominal pain at this time.    Loss of Consciousness      Home Medications Prior to Admission medications   Medication Sig Start Date End Date Taking? Authorizing Provider  Cholecalciferol (VITAMIN D3) 1000 UNITS CAPS Take 1,000  Units by mouth daily.     [provider]  docusate sodium (COLACE) 100 MG capsule Take 100 mg by mouth daily as needed for mild constipation. 03/26/09   [provider]  donepezil (ARICEPT) 10 MG tablet Take 10 mg by mouth daily. 08/29/20   [provider]  hydrOXYzine (ATARAX/VISTARIL) 25 MG tablet Take 50 mg by mouth at bedtime.  08/27/15   [provider]  LORazepam (ATIVAN) 1 MG tablet Take 1-2 mg by mouth at bedtime. 12/23/17   [provider]  losartan (COZAAR) 50 MG tablet Take 50 mg by mouth daily.    [provider]  memantine (NAMENDA) 5 MG tablet Take 5 mg by mouth daily.    [provider]  metoprolol tartrate (LOPRESSOR) 25 MG tablet Take 0.5 tablets (12.5 mg total) by mouth 2 (two) times daily. 03/15/21   Little Ishikawa, MD  mirabegron ER (MYRBETRIQ) 50 MG TB24 tablet Take 50 mg by mouth daily. 12/25/14   [provider]  pantoprazole (PROTONIX) 40 MG tablet Take 40 mg by mouth daily.    [provider]  pentosan polysulfate (ELMIRON) 100 MG capsule Take 100-200 mg by mouth See admin instructions. '100mg'$  daily, then '200mg'$  every other night 08/27/15   [provider]  Polyethylene Glycol 3350 (MIRALAX PO) Take 17 g by mouth daily as needed (constipation). 10/07/09   [provider]  PRALUENT 150 MG/ML SOAJ Inject 150 mg into the skin every 21 ( twenty-one)  days. 02/11/18   [provider]  Rivaroxaban (XARELTO) 15 MG TABS tablet Take 1 tablet (15 mg total) by mouth daily with supper. Patient taking differently: Take 15 mg by mouth daily. 07/17/13   Kinnie Feil, MD  traMADol (ULTRAM) 50 MG tablet Take 50 mg by mouth every 6 (six) hours as needed for moderate pain. 01/31/18   [provider]  vitamin B-12 (CYANOCOBALAMIN) 1000 MCG tablet Take 1,000 mcg by mouth daily.    [provider]      Allergies    Atorvastatin, Citalopram, Duloxetine hcl, Ezetimibe,  Fluvastatin, Gabapentin, Oxybutynin chloride, Pravastatin, Pregabalin, Simvastatin, and Zolpidem tartrate    Review of Systems   Review of Systems  Cardiovascular:  Positive for syncope.    Physical Exam Updated Vital Signs BP 110/72   Pulse (!) 114   Temp 98.9 F (37.2 C) (Oral)   Resp (!) 22   Ht '5\' 4"'$  (1.626 m)   Wt 56.4 kg   SpO2 95%   BMI 21.34 kg/m  Physical Exam Vitals and nursing note reviewed.  Constitutional:      General: She is not in acute distress.    Appearance: She is well-developed.  HENT:     Head: Normocephalic and atraumatic.     Nose: Nose normal.     Mouth/Throat:     Mouth: Mucous membranes are dry.  Eyes:     Conjunctiva/sclera: Conjunctivae normal.  Cardiovascular:     Rate and Rhythm: Tachycardia present. Rhythm irregular.     Heart sounds: No murmur heard. Pulmonary:     Effort: Pulmonary effort is normal. No respiratory distress.     Breath sounds: Normal breath sounds. No wheezing or rhonchi.  Abdominal:     General: Abdomen is flat. There is no distension.     Palpations: Abdomen is soft.     Tenderness: There is no abdominal tenderness. There is no guarding or rebound.  Musculoskeletal:        General: No swelling.     Cervical back: Neck supple.  Skin:    General: Skin is warm and dry.     Capillary Refill: Capillary refill takes less than 2 seconds.  Neurological:     General: No focal deficit present.     Mental Status: She is alert and oriented to person, place, and time. Mental status is at baseline.     GCS: GCS eye subscore is 4. GCS verbal subscore is 5. GCS motor subscore is 6.     Cranial Nerves: Cranial nerves 2-12 are intact.     Sensory: Sensation is intact.     Motor: Motor function is intact.     Coordination: Coordination is intact.  Psychiatric:        Mood and Affect: Mood normal.     ED Results / Procedures / Treatments   Labs (all labs ordered are listed, but only abnormal results are displayed) Labs  Reviewed  CBC WITH DIFFERENTIAL/PLATELET - Abnormal; Notable for the following components:      Result Value   WBC 2.4 (*)    Hemoglobin 11.4 (*)    HCT 35.7 (*)    RDW 16.0 (*)    Platelets 77 (*)    Neutro Abs 1.4 (*)    Lymphs Abs 0.5 (*)    All other components within normal limits  BASIC METABOLIC PANEL - Abnormal; Notable for the following components:   CO2 19 (*)    Glucose, Bld 118 (*)  Creatinine, Ser 1.38 (*)    Calcium 8.1 (*)    GFR, Estimated 37 (*)    All other components within normal limits  TROPONIN I (HIGH SENSITIVITY) - Abnormal; Notable for the following components:   Troponin I (High Sensitivity) 18 (*)    All other components within normal limits  RESP PANEL BY RT-PCR (RSV, FLU A&B, COVID)  [id number]  MAGNESIUM  PHOSPHORUS  URINALYSIS, ROUTINE W REFLEX MICROSCOPIC  TROPONIN I (HIGH SENSITIVITY)    EKG EKG Interpretation  Date/Time:  Thursday January 29 2022 22:04:47 EST Ventricular Rate:  115 PR Interval:    QRS Duration: 81 QT Interval:  332 QTC Calculation: 452 R Axis:   35 Text Interpretation: Atrial fibrillation Ventricular premature complex Confirmed by Lacretia Leigh ([id number]) on 01/29/2022 10:20:08 PM  Radiology DG Chest Port 1 View  Result Date: 01/29/2022 CLINICAL DATA:  Syncope EXAM: PORTABLE CHEST 1 VIEW COMPARISON:  03/13/2021 FINDINGS: Mild atelectasis or developing infiltrate at left base. No pleural effusion. Normal cardiomediastinal silhouette with aortic atherosclerosis. No pneumothorax IMPRESSION: Mild atelectasis or developing infiltrate at the left base. Electronically Signed   By: Donavan Foil M.D.   On: 01/29/2022 22:59    Procedures Procedures    Medications Ordered in ED Medications  lactated ringers infusion ( Intravenous New Bag/Given 01/29/22 2225)    ED Course/ Medical Decision Making/ A&P                             Medical Decision Making ORIEL OJO is an 89 year old female past medical history as  documented above presenting to the emergency department for syncopal episode at home.  Rival to the emergency department patient's blood pressure 113/56 heart rate documented is 117 on the patient is bouncing between 90s to mid 1 teens.  She is satting 94% on room air she does not appear to be in any respiratory distress.  Physical exam is benign no evidence of trauma, patient is at her baseline according to patient's daughter she is GCS 15 no focal neurological deficits on exam.  Given her past medical history as well as age differential for syncope is large.  Given her history of atrial fibrillation cardiac syncope is high in the differential.On arrival to the emergency department patient is in atrial fibrillation with a rate of 115 on her EKG.  Patient has a normal cardiac axis no elevation in ST segment or depression ST segments noted.  Given this fact and was concerned for an acute STEMI.  However we will order 2 troponins.  Patient does not appear to be in acute heart failure exacerbation does not have a history of such.  She does not appear to be volume overloaded and in fact based on physical exam appears to be rather dry.  There is no evidence of rhonchi or rales on chest auscultation and no recent history of dyspnea on exertion.  Patient is already on Xarelto and although she is in atrial fibrillation she denies any pleuritic chest pain or shortness of breath.  Given this fact I am less concerned for an acute pulmonary embolism and we withheld CT scan.  Terms of patient's labs CBC shows a WBC of 2.4 with an ANC of 1.4 patient's platelets are also depressed at 77 patient's hemoglobin is at baseline 11.4.  This fact I am not concerned for an acute anemia leading to her symptoms.  Her depressed WBCs and platelets are likely in the setting  of low nutrition or viral process.  Patient's initial troponin resulted at 18 most likely in the setting of demand.  Patient's BMP notable for normal sodium  normal potassium bicarb slightly depressed at 19 creatinine slightly elevated 1.38 which tracks with patient's stated history of poor p.o. intake phosphorus and magnesium are grossly within normal limits.  Believe patient's presentation is most likely secondary to a run of A-fib RVR at home versus other cardiac arrhythmia.  Patient also has multiple other risk factors for syncope therefore we will admit the patient to the hospital for further syncope workup.     Amount and/or Complexity of Data Reviewed Labs: ordered.  Risk Decision regarding hospitalization.           Final Clinical Impression(s) / ED Diagnoses Final diagnoses:  Atrial fibrillation, unspecified type ([place])  Syncope, unspecified syncope type    Rx / DC Orders ED Discharge Orders     None         Donzetta Matters, MD 01/30/22 Benancio Deeds    Lacretia Leigh, MD 01/31/22 1351

## 2022-01-30 ENCOUNTER — Observation Stay (HOSPITAL_COMMUNITY): Payer: PPO

## 2022-01-30 DIAGNOSIS — E876 Hypokalemia: Secondary | ICD-10-CM | POA: Diagnosis present

## 2022-01-30 DIAGNOSIS — K219 Gastro-esophageal reflux disease without esophagitis: Secondary | ICD-10-CM | POA: Diagnosis present

## 2022-01-30 DIAGNOSIS — F03A4 Unspecified dementia, mild, with anxiety: Secondary | ICD-10-CM | POA: Diagnosis present

## 2022-01-30 DIAGNOSIS — R55 Syncope and collapse: Secondary | ICD-10-CM

## 2022-01-30 DIAGNOSIS — Z66 Do not resuscitate: Secondary | ICD-10-CM | POA: Diagnosis present

## 2022-01-30 DIAGNOSIS — M8580 Other specified disorders of bone density and structure, unspecified site: Secondary | ICD-10-CM | POA: Diagnosis present

## 2022-01-30 DIAGNOSIS — Z79899 Other long term (current) drug therapy: Secondary | ICD-10-CM | POA: Diagnosis not present

## 2022-01-30 DIAGNOSIS — I110 Hypertensive heart disease with heart failure: Secondary | ICD-10-CM | POA: Diagnosis present

## 2022-01-30 DIAGNOSIS — M1990 Unspecified osteoarthritis, unspecified site: Secondary | ICD-10-CM | POA: Diagnosis present

## 2022-01-30 DIAGNOSIS — D61818 Other pancytopenia: Secondary | ICD-10-CM | POA: Diagnosis present

## 2022-01-30 DIAGNOSIS — Z7901 Long term (current) use of anticoagulants: Secondary | ICD-10-CM | POA: Diagnosis not present

## 2022-01-30 DIAGNOSIS — Z96642 Presence of left artificial hip joint: Secondary | ICD-10-CM | POA: Diagnosis present

## 2022-01-30 DIAGNOSIS — E861 Hypovolemia: Secondary | ICD-10-CM | POA: Diagnosis present

## 2022-01-30 DIAGNOSIS — I5042 Chronic combined systolic (congestive) and diastolic (congestive) heart failure: Secondary | ICD-10-CM | POA: Diagnosis present

## 2022-01-30 DIAGNOSIS — I6523 Occlusion and stenosis of bilateral carotid arteries: Secondary | ICD-10-CM | POA: Diagnosis present

## 2022-01-30 DIAGNOSIS — E872 Acidosis, unspecified: Secondary | ICD-10-CM | POA: Diagnosis present

## 2022-01-30 DIAGNOSIS — U071 COVID-19: Secondary | ICD-10-CM | POA: Diagnosis present

## 2022-01-30 DIAGNOSIS — E785 Hyperlipidemia, unspecified: Secondary | ICD-10-CM | POA: Diagnosis present

## 2022-01-30 DIAGNOSIS — E860 Dehydration: Secondary | ICD-10-CM | POA: Diagnosis not present

## 2022-01-30 DIAGNOSIS — D6959 Other secondary thrombocytopenia: Secondary | ICD-10-CM | POA: Diagnosis not present

## 2022-01-30 DIAGNOSIS — Z888 Allergy status to other drugs, medicaments and biological substances status: Secondary | ICD-10-CM | POA: Diagnosis not present

## 2022-01-30 DIAGNOSIS — Z8673 Personal history of transient ischemic attack (TIA), and cerebral infarction without residual deficits: Secondary | ICD-10-CM | POA: Diagnosis not present

## 2022-01-30 DIAGNOSIS — I480 Paroxysmal atrial fibrillation: Secondary | ICD-10-CM | POA: Diagnosis present

## 2022-01-30 LAB — RESP PANEL BY RT-PCR (RSV, FLU A&B, COVID)  RVPGX2
Influenza A by PCR: NEGATIVE
Influenza B by PCR: NEGATIVE
Resp Syncytial Virus by PCR: NEGATIVE
SARS Coronavirus 2 by RT PCR: POSITIVE — AB

## 2022-01-30 LAB — TROPONIN I (HIGH SENSITIVITY): Troponin I (High Sensitivity): 18 ng/L — ABNORMAL HIGH (ref <18)

## 2022-01-30 LAB — COMPREHENSIVE METABOLIC PANEL
ALT: 25 U/L (ref 0–44)
AST: 33 U/L (ref 15–41)
Albumin: 3.6 g/dL (ref 3.5–5.0)
Alkaline Phosphatase: 28 U/L — ABNORMAL LOW (ref 38–126)
Anion gap: 10 (ref 5–15)
BUN: 19 mg/dL (ref 8–23)
CO2: 21 mmol/L — ABNORMAL LOW (ref 22–32)
Calcium: 8.2 mg/dL — ABNORMAL LOW (ref 8.9–10.3)
Chloride: 106 mmol/L (ref 98–111)
Creatinine, Ser: 1.28 mg/dL — ABNORMAL HIGH (ref 0.44–1.00)
GFR, Estimated: 41 mL/min — ABNORMAL LOW (ref >60)
Glucose, Bld: 106 mg/dL — ABNORMAL HIGH (ref 70–99)
Potassium: 3.2 mmol/L — ABNORMAL LOW (ref 3.5–5.1)
Sodium: 137 mmol/L (ref 135–145)
Total Bilirubin: 0.6 mg/dL (ref 0.3–1.2)
Total Protein: 6.3 g/dL — ABNORMAL LOW (ref 6.5–8.1)

## 2022-01-30 LAB — URINALYSIS, ROUTINE W REFLEX MICROSCOPIC
Bacteria, UA: NONE SEEN
Bilirubin Urine: NEGATIVE
Glucose, UA: NEGATIVE mg/dL
Hgb urine dipstick: NEGATIVE
Ketones, ur: 20 mg/dL — AB
Leukocytes,Ua: NEGATIVE
Nitrite: NEGATIVE
Protein, ur: 30 mg/dL — AB
Specific Gravity, Urine: 1.019 (ref 1.005–1.030)
pH: 5 (ref 5.0–8.0)

## 2022-01-30 LAB — ECHOCARDIOGRAM COMPLETE
AR max vel: 2.33 cm2
AV Area VTI: 1.98 cm2
AV Area mean vel: 2.22 cm2
AV Mean grad: 2 mmHg
AV Peak grad: 4.6 mmHg
Ao pk vel: 1.07 m/s
Est EF: 55
Height: 64 in
S' Lateral: 2.3 cm
Weight: 1989.43 oz

## 2022-01-30 LAB — CBC
HCT: 34.2 % — ABNORMAL LOW (ref 36.0–46.0)
Hemoglobin: 11.7 g/dL — ABNORMAL LOW (ref 12.0–15.0)
MCH: 30.7 pg (ref 26.0–34.0)
MCHC: 34.2 g/dL (ref 30.0–36.0)
MCV: 89.8 fL (ref 80.0–100.0)
Platelets: 82 10*3/uL — ABNORMAL LOW (ref 150–400)
RBC: 3.81 MIL/uL — ABNORMAL LOW (ref 3.87–5.11)
RDW: 15.9 % — ABNORMAL HIGH (ref 11.5–15.5)
WBC: 2 10*3/uL — ABNORMAL LOW (ref 4.0–10.5)
nRBC: 0 % (ref 0.0–0.2)

## 2022-01-30 LAB — PROCALCITONIN: Procalcitonin: <0.1 ng/mL

## 2022-01-30 LAB — LACTIC ACID, PLASMA: Lactic Acid, Venous: 1.1 mmol/L (ref 0.5–1.9)

## 2022-01-30 LAB — MAGNESIUM: Magnesium: 2 mg/dL (ref 1.7–2.4)

## 2022-01-30 MED ORDER — PANTOPRAZOLE SODIUM 40 MG PO TBEC
40.0000 mg | DELAYED_RELEASE_TABLET | Freq: Every day | ORAL | Status: DC
  Administered 2022-01-31: 40 mg via ORAL
  Filled 2022-01-30 (×2): qty 1

## 2022-01-30 MED ORDER — SODIUM CHLORIDE 0.9 % IV SOLN
100.0000 mg | Freq: Every day | INTRAVENOUS | Status: DC
  Filled 2022-01-30: qty 20

## 2022-01-30 MED ORDER — LACTATED RINGERS IV SOLN: INTRAVENOUS | Status: DC

## 2022-01-30 MED ORDER — ACETAMINOPHEN 325 MG PO TABS: 650.0000 mg | ORAL_TABLET | Freq: Four times a day (QID) | ORAL | Status: DC | PRN

## 2022-01-30 MED ORDER — POTASSIUM CHLORIDE CRYS ER 20 MEQ PO TBCR
40.0000 meq | EXTENDED_RELEASE_TABLET | Freq: Every day | ORAL | Status: DC
  Administered 2022-01-30 – 2022-01-31 (×2): 40 meq via ORAL
  Filled 2022-01-30 (×2): qty 2

## 2022-01-30 MED ORDER — VITAMIN D3 25 MCG (1000 UNIT) PO TABS
1000.0000 [IU] | ORAL_TABLET | Freq: Every day | ORAL | Status: DC
  Administered 2022-01-30: 1000 [IU] via ORAL
  Filled 2022-01-30 (×4): qty 1

## 2022-01-30 MED ORDER — GUAIFENESIN-DM 100-10 MG/5ML PO SYRP
5.0000 mL | ORAL_SOLUTION | ORAL | Status: DC | PRN
  Filled 2022-01-30: qty 5

## 2022-01-30 MED ORDER — MEMANTINE HCL 5 MG PO TABS
5.0000 mg | ORAL_TABLET | Freq: Every day | ORAL | Status: DC
  Administered 2022-01-30 – 2022-01-31 (×2): 5 mg via ORAL
  Filled 2022-01-30: qty 1

## 2022-01-30 MED ORDER — MIRABEGRON ER 25 MG PO TB24
50.0000 mg | ORAL_TABLET | Freq: Every day | ORAL | Status: DC
  Administered 2022-01-31: 50 mg via ORAL
  Filled 2022-01-30: qty 2

## 2022-01-30 MED ORDER — LORAZEPAM 1 MG PO TABS
1.0000 mg | ORAL_TABLET | Freq: Every day | ORAL | Status: DC
  Administered 2022-01-30: 1 mg via ORAL
  Filled 2022-01-30: qty 1

## 2022-01-30 MED ORDER — METOPROLOL TARTRATE 12.5 MG HALF TABLET
12.5000 mg | ORAL_TABLET | Freq: Two times a day (BID) | ORAL | Status: DC
  Administered 2022-01-30 – 2022-01-31 (×3): 12.5 mg via ORAL
  Filled 2022-01-30 (×3): qty 1

## 2022-01-30 MED ORDER — MELATONIN 3 MG PO TABS
3.0000 mg | ORAL_TABLET | Freq: Every evening | ORAL | Status: DC | PRN
  Administered 2022-01-30: 3 mg via ORAL
  Filled 2022-01-30: qty 1

## 2022-01-30 MED ORDER — SODIUM CHLORIDE 0.9 % IV SOLN
200.0000 mg | Freq: Once | INTRAVENOUS | Status: AC
  Administered 2022-01-30: 200 mg via INTRAVENOUS
  Filled 2022-01-30: qty 40

## 2022-01-30 MED ORDER — RIVAROXABAN 15 MG PO TABS: 15.0000 mg | ORAL_TABLET | Freq: Every day | ORAL | Status: DC

## 2022-01-30 MED ORDER — LACTATED RINGERS IV SOLN: INTRAVENOUS | Status: AC

## 2022-01-30 MED ORDER — DONEPEZIL HCL 10 MG PO TABS
10.0000 mg | ORAL_TABLET | Freq: Every day | ORAL | Status: DC
  Administered 2022-01-30: 10 mg via ORAL
  Filled 2022-01-30: qty 1

## 2022-01-30 MED ORDER — VITAMIN B-12 1000 MCG PO TABS
1000.0000 ug | ORAL_TABLET | Freq: Every day | ORAL | Status: DC
  Administered 2022-01-30: 1000 ug via ORAL
  Filled 2022-01-30: qty 1

## 2022-01-30 MED ORDER — PROCHLORPERAZINE EDISYLATE 10 MG/2ML IJ SOLN: 5.0000 mg | Freq: Four times a day (QID) | INTRAMUSCULAR | Status: DC | PRN

## 2022-01-30 NOTE — ED Notes (Signed)
Admitting physician at bedside evaluating patient and going over admission plans

## 2022-01-30 NOTE — Evaluation (Signed)
Occupational Therapy Evaluation Patient Details Name: Christine [name] MRN: [id number] DOB: 15-Feb-1933 Today's Date: 01/30/2022   History of Present Illness Pt is an 89 yo female admitted after syncopal episode and found to have COVID, is in afib and with pancytopenia. PMH:  dementia, EF 45%, afib, carotid artery stenosis.   Clinical Impression   Pt admitted with the above diagnosis and has the deficits outlined below. Pt would benefit from cont OT to increase independence back to mod I level so she can d/c back home to stay with her daughter. Pt lives next door to daughter and daughter checks on her 4x a day.  Pt gets assist with meds, cooking and cleaning.  Daughter states pt still drives 1-2 miles down street to grocery store and [employer].  MDS:  with pt's advanced dementia, feel this pt should not longer be driving based on impaired memory, reaction time, processing speed and problem solving skills.  Talked to daughter and daughter does not want pt to go to SNF and states she and granddaughter can provide 24/7 assist at d/c.  This will be necessary for pt to go home due to pt being a high fall risk as she is very unsteady on her feet.  Will rec HHOT if pt's family can provide 24/7 care.     Recommendations for follow up therapy are one component of a multi-disciplinary discharge planning process, led by the attending physician.  Recommendations may be updated based on patient status, additional functional criteria and insurance authorization.   Follow Up Recommendations  Home health OT     Assistance Recommended at Discharge Frequent or constant Supervision/Assistance  Patient can return home with the following A little help with walking and/or transfers;A little help with bathing/dressing/bathroom;Assistance with cooking/housework;Direct supervision/assist for medications management;Direct supervision/assist for financial management;Assist for transportation;Help with stairs or ramp for  entrance    Functional Status Assessment  Patient has had a recent decline in their functional status and demonstrates the ability to make significant improvements in function in a reasonable and predictable amount of time.  Equipment Recommendations  None recommended by OT    Recommendations for Other Services       Precautions / Restrictions Precautions Precautions: Fall Precaution Comments: very unsteady on feet Restrictions Weight Bearing Restrictions: No      Mobility Bed Mobility Overal bed mobility: Needs Assistance Bed Mobility: Supine to Sit, Sit to Supine     Supine to sit: Min assist, HOB elevated Sit to supine: Min assist, HOB elevated   General bed mobility comments: assist to get legs all the way back up in bed. Pt got legs off bed but required assist to get to full sitting position on gurney    Transfers Overall transfer level: Needs assistance Equipment used: 1 person hand held assist Transfers: Sit to/from Stand, Bed to chair/wheelchair/BSC Sit to Stand: Min assist     Step pivot transfers: Min assist     General transfer comment: pt very unsteady looking to grab onto furniture and other people. Pt usually does not use walker at home.      Balance Overall balance assessment: Needs assistance Sitting-balance support: Feet supported Sitting balance-Leahy Scale: Good     Standing balance support: During functional activity Standing balance-Leahy Scale: Poor Standing balance comment: pt reliant on outside support                           ADL either performed or assessed with  clinical judgement   ADL Overall ADL's : Needs assistance/impaired Eating/Feeding: Set up;Sitting   Grooming: Wash/dry hands;Wash/dry face;Oral care;Minimal assistance;Standing Grooming Details (indicate cue type and reason): Pt stood at sink with min assist to groom and min assist for balance. Upper Body Bathing: Minimal assistance;Sitting   Lower Body  Bathing: Moderate assistance;Sit to/from stand;Cueing for compensatory techniques Lower Body Bathing Details (indicate cue type and reason): Pt unsteady in standing when bathing Upper Body Dressing : Minimal assistance;Sitting   Lower Body Dressing: Moderate assistance;Sit to/from stand;Cueing for compensatory techniques Lower Body Dressing Details (indicate cue type and reason): assist with socks/starting pants over feet Toilet Transfer: Minimal assistance;Ambulation;Comfort height toilet;Grab bars Toilet Transfer Details (indicate cue type and reason): Pt ambulated to bathroom with +1 assist and daughter assiting with IV Toileting- Clothing Manipulation and Hygiene: Supervision/safety;Sitting/lateral lean       Functional mobility during ADLs: Minimal assistance General ADL Comments: Pt limited with LE adls in standing due to poor balance.     Vision Baseline Vision/History: 0 No visual deficits Ability to See in Adequate Light: 0 Adequate Patient Visual Report: No change from baseline Vision Assessment?: No apparent visual deficits     Perception Perception Perception Tested?: No   Praxis Praxis Praxis tested?: Within functional limits    Pertinent Vitals/Pain Pain Assessment Pain Assessment: No/denies pain     Hand Dominance Right   Extremity/Trunk Assessment Upper Extremity Assessment Upper Extremity Assessment: Generalized weakness   Lower Extremity Assessment Lower Extremity Assessment: Defer to PT evaluation   Cervical / Trunk Assessment Cervical / Trunk Assessment: Normal   Communication Communication Communication: No difficulties   Cognition Arousal/Alertness: Awake/alert Behavior During Therapy: Anxious Overall Cognitive Status: History of cognitive impairments - at baseline                                 General Comments: daughter states pt at baseline. Pt oriented to self only.  Pt with poor problem solving, poor memory, poor motor  planning.  Pt should not be driving a vehicle.     General Comments  Pt with significant dementia, poor balance and weakness affecting adl independence.  Feel pt should not be at home alone    Exercises     Shoulder Instructions      Home Living Family/patient expects to be discharged to:: Private residence Living Arrangements: Alone Available Help at Discharge: Family;Available 24 hours/day Type of Home: House Home Access: Stairs to enter [employer] of Steps: 4 Entrance Stairs-Rails: Right Home Layout: One level     Bathroom Shower/Tub: Producer, television/film/video: Handicapped height     Home Equipment: Shower seat;Toilet riser   Additional Comments: lives next door to daughter on one side and granddaughter on the other. Family says they can provide 24 hours assist and they do not want her to go to SNF.      Prior Functioning/Environment Prior Level of Function : Independent/Modified Independent;Driving             Mobility Comments: Pt does not use assistive device ADLs Comments: Mod I with basic adls, sponge bathes most of time.  Family assists with cleaning, meals and all meds and finances. Pt does drive down street to grocery and [employer]. Spoke to pt and daughter about the need for pt to stop driving from OT perspective due to significant cognitive deficits and decreased reaction time.  OT Problem List: Decreased strength;Impaired balance (sitting and/or standing);Decreased safety awareness;Decreased cognition;Decreased knowledge of use of DME or AE      OT Treatment/Interventions: Self-care/ADL training;Therapeutic activities;Patient/family education    OT Goals(Current goals can be found in the care plan section) Acute Rehab OT Goals Patient Stated Goal: none stated OT Goal Formulation: With patient/family Time For Goal Achievement: 02/13/22 Potential to Achieve Goals: Fair ADL Goals Pt Will Perform Grooming: with  supervision;standing Pt Will Perform Lower Body Bathing: with supervision;sit to/from stand Pt Will Perform Lower Body Dressing: with supervision;sit to/from stand Additional ADL Goal #1: Pt will walk to bathroom and complete all toileting on high commode with supervision.  OT Frequency: Min 2X/week    Co-evaluation              AM-PAC OT "6 Clicks" Daily Activity     Outcome Measure Help from another person eating meals?: None Help from another person taking care of personal grooming?: A Little Help from another person toileting, which includes using toliet, bedpan, or urinal?: A Little Help from another person bathing (including washing, rinsing, drying)?: A Little Help from another person to put on and taking off regular upper body clothing?: A Little Help from another person to put on and taking off regular lower body clothing?: A Lot 6 Click Score: 18   End of Session Nurse Communication: Mobility status;Other (comment) (need for pt to stop driving)  Activity Tolerance: Patient tolerated treatment well Patient left: in bed;with call bell/phone within reach;with family/visitor present  OT Visit Diagnosis: Unsteadiness on feet (R26.81);Muscle weakness (generalized) (M62.81)                Time: 0623-7628 OT Time Calculation (min): 24 min Charges:  OT General Charges $OT Visit: 1 Visit OT Evaluation $OT Eval Moderate Complexity: 1 Mod OT Treatments $Self Care/Home Management : 8-22 mins  Hope Budds 01/30/2022, 8:50 AM

## 2022-01-30 NOTE — ED Notes (Signed)
Daughter gave pt home meds. Gave vitamin B12

## 2022-01-30 NOTE — H&P (Addendum)
History and Physical  Christine [name] ZOX:[id number] DOB: 01-12-1933 DOA: 01/29/2022  Referring physician: Dr. Roseanne Reno, EDP  PCP: Rodrigo Ran, MD  Outpatient Specialists: Cardiology Patient coming from: Home  Chief Complaint: Syncope   HPI: Christine [name] is a 89 y.o. female with medical history significant for dementia, paroxysmal A-fib on Xarelto, HFrEF 45%, osteoarthritis, carotid artery stenosis, who presented to [facility] ED from home due to a witnessed syncopal episode lasting about a minute per her daughter at bedside and rapidly returning to her baseline upon awakening.  Per her daughter she has had poor oral intake.  EMS was activated.  Upon EMS arrival, the  patient was in A-fib with RVR rates in the 160s.  In the ED, noted to have a productive cough, and per her daughter, she just noticed it tonight.  Chest x-ray revealed mild atelectasis or developing infiltrate at the left base.  Lab studies were notable for pancytopenia.  Due to concern for syncope and possible left lower lobe pneumonia, TRH was asked to admit.  ED Course: Tmax 98.9.  BP 91/79, pulse 87, respiratory 15, O2 saturation 93% on room air.  Lab studies remarkable for WBC 2.4, hemoglobin 11.4, platelet count 77.  COVID-19 screening test positive.  Review of Systems: Review of systems as noted in the HPI. All other systems reviewed and are negative.   Past Medical History:  Diagnosis Date   AR (allergic rhinitis)    Arthritis    Bilateral carotid artery stenosis    MILD --  40% BILATERAL ICA PER DOPPLER JULY 2014   Diverticulosis    Frequency of urination    GERD (gastroesophageal reflux disease)    History of colon polyps    ADENOMATOUS   History of Helicobacter pylori infection    History of shingles    History of TIA (transient ischemic attack)    Hyperlipidemia    Hypertension    Impaired memory    Interstitial cystitis    Iron deficiency    Nocturia    Osteopenia    PAF (paroxysmal atrial fibrillation)  (HCC)    dx 07/ 2015   Pelvic pain in female    Urgency of urination    Vitamin D deficiency    Wears glasses    Past Surgical History:  Procedure Laterality Date   ABDOMINAL HYSTERECTOMY     CATARACT EXTRACTION W/ INTRAOCULAR LENS  IMPLANT, BILATERAL  2009   CYSTO WITH HYDRODISTENSION  02/24/2011   Procedure: CYSTOSCOPY/HYDRODISTENSION;  Surgeon: Martina Sinner, MD;  Location: [facility];  Service: Urology;  Laterality: N/A;  INSTILLATION OF marcaine and  pyridium   CYSTO WITH HYDRODISTENSION N/A 06/20/2013   Procedure: CYSTOSCOPY/HYDRODISTENSION/INSTALLATION OF PYRIDIUM -MARCAINE 0.5%.;  Surgeon: Martina Sinner, MD;  Location: [facility];  Service: Urology;  Laterality: N/A;   CYSTO WITH HYDRODISTENSION N/A 03/22/2014   Procedure: CYSTOSCOPY/HYDRODISTENSION INSTILLATION OF MARCAINE AND PYRDIUM ;  Surgeon: Alfredo Martinez, MD;  Location: [facility] [place];  Service: Urology;  Laterality: N/A;   CYSTO WITH HYDRODISTENSION N/A 09/17/2014   Procedure: CYSTOSCOPY HYDRODISTENSION INSTILLATION OF MARCAINE AND PYRIDIUM;  Surgeon: Alfredo Martinez, MD;  Location: [facility] [place];  Service: Urology;  Laterality: N/A;   CYSTO/ HOD/ INSTILLATION THERAPY  10-04-2008  &  05-02-2009   CYSTO/ LEFT URETEROSCOPY W/ LEFT URETERAL DILATION  04-15-2006   TOTAL HIP ARTHROPLASTY Left 05/29/2016   Procedure: TOTAL HIP ARTHROPLASTY ANTERIOR APPROACH;  Surgeon: Cammy Copa, MD;  Location: [facility] OR;  Service: Orthopedics;  Laterality: Left;   TRANSTHORACIC ECHOCARDIOGRAM  07-17-2013   grade I diastolic dysfuntion/  ef 60-65%/  mild AR and MR/  trivial TR   URETEROLITHOTOMY Bilateral 11-24-2004 LEFT  &  05-29-2006  RIGHT   VAULT SUSPENSION WITH GRAFT/ CYSTOCELE REPAIR  09-17-2009    Social History:  reports that she has never smoked. She has never used smokeless tobacco. She reports that she does not drink alcohol and does not use drugs.   Allergies   Allergen Reactions   Atorvastatin     Other reaction(s): hot flashes; felt like walking/leaning to L side; constipation   Citalopram     Other reaction(s): felt it made the inside and outside of her skin burn.it is not clear if this is an allergy or just an adverse reaction of some kind.   Duloxetine Hcl     Other reaction(s): she can't tolerate it and wanted it listed as an allergy   Ezetimibe     Other reaction(s): "had problems with it"   Fluvastatin     Other reaction(s): made legs ache.   Gabapentin Other (See Comments)    hallucinations   Oxybutynin Chloride     Other reaction(s): throat swelling; burning eyelids; nausea   Pravastatin     Other reaction(s): "had problems with it"   Pregabalin     Other reaction(s): made her feel funny   Simvastatin     Other reaction(s): leg pain   Zolpidem Tartrate     Other reaction(s): hallucinations.      Prior to Admission medications   Medication Sig Start Date End Date Taking? Authorizing Provider  Cholecalciferol (VITAMIN D3) 1000 UNITS CAPS Take 1,000 Units by mouth daily.     [provider]  docusate sodium (COLACE) 100 MG capsule Take 100 mg by mouth daily as needed for mild constipation. 03/26/09   [provider]  donepezil (ARICEPT) 10 MG tablet Take 10 mg by mouth daily. 08/29/20   [provider]  hydrOXYzine (ATARAX/VISTARIL) 25 MG tablet Take 50 mg by mouth at bedtime.  08/27/15   [provider]  LORazepam (ATIVAN) 1 MG tablet Take 1-2 mg by mouth at bedtime. 12/23/17   [provider]  losartan (COZAAR) 50 MG tablet Take 50 mg by mouth daily.    [provider]  memantine (NAMENDA) 5 MG tablet Take 5 mg by mouth daily.    [provider]  metoprolol tartrate (LOPRESSOR) 25 MG tablet Take 0.5 tablets (12.5 mg total) by mouth 2 (two) times daily. 03/15/21   Azucena Fallen, MD  mirabegron ER (MYRBETRIQ) 50 MG TB24 tablet Take 50 mg by mouth daily. 12/25/14    [provider]  pantoprazole (PROTONIX) 40 MG tablet Take 40 mg by mouth daily.    [provider]  pentosan polysulfate (ELMIRON) 100 MG capsule Take 100-200 mg by mouth See admin instructions. 100mg  daily, then 200mg  every other night 08/27/15   [provider]  Polyethylene Glycol 3350 (MIRALAX PO) Take 17 g by mouth daily as needed (constipation). 10/07/09   [provider]  PRALUENT 150 MG/ML SOAJ Inject 150 mg into the skin every 21 ( twenty-one) days. 02/11/18   [provider]  Rivaroxaban (XARELTO) 15 MG TABS tablet Take 1 tablet (15 mg total) by mouth daily with supper. Patient taking differently: Take 15 mg by mouth daily. 07/17/13   Esperanza Sheets, MD  traMADol (ULTRAM) 50 MG tablet Take 50 mg by mouth every  6 (six) hours as needed for moderate pain. 01/31/18   [provider]  vitamin B-12 (CYANOCOBALAMIN) 1000 MCG tablet Take 1,000 mcg by mouth daily.    [provider]    Physical Exam: BP 110/72   Pulse (!) 114   Temp 98.9 F (37.2 C) (Oral)   Resp (!) 22   Ht 5\' 4"  (1.626 m)   Wt 56.4 kg   SpO2 95%   BMI 21.34 kg/m   General: 89 y.o. year-old female well developed well nourished in no acute distress.  Alert and confused in the setting of dementia. Cardiovascular: Tachycardic with no rubs or gallops.  No thyromegaly or JVD noted.  No lower extremity edema. 2/4 pulses in all 4 extremities. Respiratory: Clear to auscultation with no wheezes or rales. Good inspiratory effort. Abdomen: Soft nontender nondistended with normal bowel sounds x4 quadrants. Muskuloskeletal: No cyanosis, clubbing or edema noted bilaterally Neuro: CN II-XII intact, strength, sensation, reflexes Skin: No ulcerative lesions noted or rashes Psychiatry: Judgement and insight appear altered. Mood is appropriate for condition and setting          Labs on Admission:  Basic Metabolic Panel: Recent Labs  Lab 01/29/22 2208  NA 135  K 3.6   CL 105  CO2 19*  GLUCOSE 118*  BUN 21  CREATININE 1.38*  CALCIUM 8.1*  MG 1.8  PHOS 3.4   Liver Function Tests: No results for input(s): "AST", "ALT", "ALKPHOS", "BILITOT", "PROT", "ALBUMIN" in the last 168 hours. No results for input(s): "LIPASE", "AMYLASE" in the last 168 hours. No results for input(s): "AMMONIA" in the last 168 hours. CBC: Recent Labs  Lab 01/29/22 2208  WBC 2.4*  NEUTROABS 1.4*  HGB 11.4*  HCT 35.7*  MCV 92.2  PLT 77*   Cardiac Enzymes: No results for input(s): "CKTOTAL", "CKMB", "CKMBINDEX", "TROPONINI" in the last 168 hours.  BNP (last 3 results) No results for input(s): "BNP" in the last 8760 hours.  ProBNP (last 3 results) No results for input(s): "PROBNP" in the last 8760 hours.  CBG: No results for input(s): "GLUCAP" in the last 168 hours.  Radiological Exams on Admission: DG Chest Port 1 View  Result Date: 01/29/2022 CLINICAL DATA:  Syncope EXAM: PORTABLE CHEST 1 VIEW COMPARISON:  03/13/2021 FINDINGS: Mild atelectasis or developing infiltrate at left base. No pleural effusion. Normal cardiomediastinal silhouette with aortic atherosclerosis. No pneumothorax IMPRESSION: Mild atelectasis or developing infiltrate at the left base. Electronically Signed   By: Jasmine Pang M.D.   On: 01/29/2022 22:59    EKG: I independently viewed the EKG done and my findings are as followed: Atrial fibrillation rate of 115.  Nonspecific ST-T changes.  QTc 452.  Assessment/Plan Present on Admission:  Syncope  Principal Problem:   Syncope  Syncope, suspect from hypovolemia Witnessed by the patient's daughter who states "she went out for a few seconds maybe a minute." Poor oral intake Received IV fluid hydration in the ED Closely monitor volume status while on IV fluid due to underlying HFrEF 45%. Obtain orthostatic vital signs Follow 2D echo Monitor on telemetry  COVID-19 viral infection, POA COVID-19 screening test positive on 01/29/2022. Left lower  lobe infiltrates on chest x-ray Not hypoxic 3-day course of IV remdesivir for mild to moderate COVID-19 viral infection Incentive spirometer  Pancytopenia, possibly from viral infection All 3 cell lines down, WBC 2.4, hemoglobin 11.4, platelet count 77 Repeat CBC  Paroxysmal A-fib with RVR Prior to admission, rate controlled on Lopressor, resume Resume home Xarelto for  CVA prevention Monitor on telemetry  Chronic combined diastolic and systolic CHF Last 2D echo done on 03/14/2021 showed LVEF 45% and grade 1 diastolic dysfunction Euvolemic on exam Strict I's and O's and daily weight  Mild non-anion gap metabolic acidosis Serum bicarb 19, anion gap 8 Received IV fluid in the ED Repeat chemistry panel  Chronic anxiety Resume home regimen.  GERD Resume home PPI  Physical debility PT OT assessment Fall precautions  Dementia Reorient as needed Resume home Namenda Hold off home donepezil due to side effect of syncope    DVT prophylaxis: Home Xarelto  Code Status: DNR, form in the room.  Family Communication: Updated patient's daughter at bedside.  Disposition Plan: Admitted to telemetry medical unit  Consults called: None.  Admission status: Observation status.   Status is: Observation    Darlin Drop MD [facility] Pager [phone]  If 7PM-7AM, please contact night-coverage [email or web address] Password [facility]  01/30/2022, 12:23 AM

## 2022-01-30 NOTE — Progress Notes (Signed)
Patient seen and examined.  Admitted early morning hours by nighttime hospitalist.  See assessment plan.  Additional history taken from daughter at the bedside.  In brief, 89 year old from home with history of dementia, paroxysmal A-fib on Xarelto, known chronic systolic heart failure with ejection fraction 45% brought to the ER from home due to near syncopal episode witnessed by daughter.  Recently poor oral intake and started coughing.  No fever.  In the emergency room hemodynamically stable.  On room air.  COVID-19 was positive.  She was also found to have leukopenia.  Hypokalemia.  Syncope likely related to hypovolemia: Encourage oral intake.  Continue low-dose maintenance IV fluids.  Check orthostatic vitals.  Work with PT OT.  COVID-19 viral infection: Screening test positive.  Left lower lobe infiltrate on chest x-ray.  On room air.  We can complete 3 days of remdesivir.  Paroxysmal A-fib with RVR: Heart rate controlled after restarting home dose of blood pressure.  Therapeutic on Xarelto.  Clinically stabilizing.  Anticipate home tomorrow if adequate improvement.  Total time spent: 30 minutes.  Same-day admit.  No charge visit.

## 2022-01-30 NOTE — TOC Initial Note (Signed)
Transition of Care (TOC) - Initial/Assessment Note   Spoke to daughter at bedside with patient present.   Confirmed face sheet information.   Patient will return to her address at discharge, daughter will provide 24/7 care.   Patient has had home health in the past through [facility] and would like [facility] again. Daughter requesting HHPT Hill[facility] Jacks . Tresa Endo accepted referral and request.   Secure chatted MD to sign orders and face to face  Patient Details  Name: Christine [name] MRN: [id number] Date of Birth: 03/22/1933  Transition of Care [facility]) CM/SW Contact:    Kingsley Plan, RN Phone Number: 01/30/2022, 1:52 PM  Clinical Narrative:                   Expected Discharge Plan: Home w Home Health Services Barriers to Discharge: Continued Medical Work up   Patient Goals and CMS Choice   CMS Medicare.gov Compare Post Acute Care list provided to:: Patient Choice offered to / list presented to : Adult Children      Expected Discharge Plan and Services   Discharge Planning Services: CM Consult Post Acute Care Choice: Home Health Living arrangements for the past 2 months: Single Family Home                 DME Arranged: N/A         HH Arranged: PT, OT HH Agency: [facility] Date HH Agency Contacted: 01/30/22 Time HH Agency Contacted: 1352 Representative spoke with at [facility] Agency: kelly  Prior Living Arrangements/Services Living arrangements for the past 2 months: Single Family Home Lives with:: Self Patient language and need for interpreter reviewed:: Yes Do you feel safe going back to the place where you live?: Yes      Need for Family Participation in Patient Care: Yes (Comment) Care giver support system in place?: Yes (comment) Current home services: DME Criminal Activity/Legal Involvement Pertinent to Current Situation/Hospitalization: No - Comment as needed  Activities of Daily Living      Permission Sought/Granted   Permission granted to  share information with : Yes, Verbal Permission Granted  Share Information with NAME: Christine [name]  Permission granted to share info w AGENCY: [facility]        Emotional Assessment Appearance:: Appears stated age       Alcohol / Substance Use: Not Applicable Psych Involvement: No (comment)  Admission diagnosis:  Syncope and collapse [R55] Syncope [R55] Syncope, unspecified syncope type [R55] Atrial fibrillation, unspecified type [facility]) [I48.91] Patient Active Problem List   Diagnosis Date Noted   Syncope 01/30/2022   Syncope and collapse 01/30/2022   Malnutrition of moderate degree 03/15/2021   Vitamin B12 deficiency 03/14/2021   Dyslipidemia 03/14/2021   Recurrent falls 03/14/2021   NSTEMI (non-ST elevated myocardial infarction) (HCC) 03/13/2021   History of kidney stones 10/23/2020   Dementia without behavioral disturbance (HCC) 08/30/2020   Pain in right knee 08/30/2020   Vitamin D deficiency 08/30/2020   Bilateral impacted cerumen 08/22/2019   Thrombophilia (HCC) 03/02/2019   Increased frequency of urination 10/14/2017   Impacted cerumen 08/04/2016   Age-related osteoporosis without current pathological fracture 07/06/2016   Hip fracture (HCC) 05/29/2016   Closed left hip fracture (HCC) 05/27/2016   Insomnia 12/17/2015   Memory loss 12/17/2015   Dysuria 12/04/2015   AKI (acute kidney injury) (HCC) 10/01/2015   Acute encephalopathy 10/01/2015   Interstitial cystitis 10/01/2015   Essential hypertension 10/01/2015   Rhabdomyolysis 09/30/2015   Major depression, single episode  07/10/2015   Non-thrombocytopenic purpura (HCC) 07/10/2015   Encounter for general adult medical examination without abnormal findings 11/30/2014   Fall 08/07/2013   Pain in left leg 08/07/2013   UTI (lower urinary tract infection) 07/16/2013   Paroxysmal atrial fibrillation (HCC) 07/16/2013   Chest pain 07/16/2013   Transient ischemic attack 07/04/2012   Chronic pain syndrome 03/28/2012    Residual hemorrhoidal skin tags 01/18/2012   Skin sensation disturbance 03/03/2010   ABDOMINAL PAIN, UNSPECIFIED SITE 02/03/2010   Pain in limb 10/07/2009   Anemia 03/26/2009   Allergic rhinitis 02/27/2009   Anxiety disorder 02/27/2009   Hyperlipidemia 02/27/2009   Low compliance bladder 02/27/2009   DYSPEPSIA 01/18/2008   ADENOMATOUS COLONIC POLYP 01/17/2008   HEMORRHOIDS, INTERNAL 01/17/2008   ESOPHAGITIS 01/17/2008   DIVERTICULOSIS, COLON 01/17/2008   HEMORRHOIDS-EXTERNAL 12/07/2007   GERD 12/07/2007   Constipation 12/07/2007   RECTAL BLEEDING 12/07/2007   Loss of weight 12/07/2007   Nausea alone 12/07/2007   HEARTBURN 12/07/2007   ABDOMINAL BLOATING 12/07/2007   PERSONAL HX COLONIC POLYPS 12/07/2007   PCP:  Christine Ran, MD Pharmacy:   [id number] [place], [place] - [address] [place] [address] Phone: [phone] Fax: [phone]     Social Determinants of Health (SDOH) Social History: SDOH Screenings   Tobacco Use: Low Risk  (01/29/2022)   SDOH Interventions:     Readmission Risk Interventions     No data to display

## 2022-01-30 NOTE — Evaluation (Signed)
Physical Therapy Evaluation Patient Details Name: Christine [name] MRN: [id number] DOB: 20-Oct-1933 Today's Date: 01/30/2022  History of Present Illness  Pt is an 89 yo female admitted after syncopal episode and found to have COVID, is in afib and with pancytopenia. PMH:  dementia, EF 45%, afib, carotid artery stenosis.   Clinical Impression  Christine [name] is 89 y.o. female admitted with above HPI and diagnosis. Patient is currently limited by functional impairments below (see PT problem list). Patient lives alone and per family has been mobilizing in home with no AD at baseline. Pt required min assist for transfers today and min-mod assist for short bout of gait, as pt fatigues she requires increased cues and assist for safe sequencing. Discussed safety concerns given dementia and cognitive fatigue with activity making pt at greater risk for falls and importance of 24/7 assist/supervision at home with pt's daughter. Patient will benefit from continued skilled PT interventions to address impairments and progress independence with mobility, recommending HHPT with 24/7 assist from family as pt is a high fall risk. Acute PT will follow and progress as able.        Recommendations for follow up therapy are one component of a multi-disciplinary discharge planning process, led by the attending physician.  Recommendations may be updated based on patient status, additional functional criteria and insurance authorization.  Follow Up Recommendations Home health PT      Assistance Recommended at Discharge Frequent or constant Supervision/Assistance  Patient can return home with the following  A little help with bathing/dressing/bathroom;A lot of help with walking and/or transfers;Assistance with cooking/housework;Assistance with feeding;Direct supervision/assist for medications management;Direct supervision/assist for financial management;Assist for transportation;Help with stairs or ramp for entrance     Equipment Recommendations Rolling walker (2 wheels)  Recommendations for Other Services       Functional Status Assessment Patient has had a recent decline in their functional status and demonstrates the ability to make significant improvements in function in a reasonable and predictable amount of time.     Precautions / Restrictions Precautions Precautions: Fall Precaution Comments: very unsteady on feet Restrictions Weight Bearing Restrictions: No      Mobility  Bed Mobility Overal bed mobility: Needs Assistance Bed Mobility: Supine to Sit, Sit to Supine     Supine to sit: Min assist, HOB elevated Sit to supine: Min assist, HOB elevated   General bed mobility comments: assist to get legs all the way back up in bed. Pt got legs off bed but required assist to get to full sitting position on gurney    Transfers Overall transfer level: Needs assistance Equipment used: 1 person hand held assist Transfers: Sit to/from Stand, Bed to chair/wheelchair/BSC Sit to Stand: Min assist   Step pivot transfers: Min assist       General transfer comment: pt very unsteady looking to grab onto furniture and other people. Pt usually does not use walker at home.    Ambulation/Gait Ambulation/Gait assistance: Min assist, Mod assist Gait Distance (Feet): 20 Feet Assistive device: Rolling walker (2 wheels) Gait Pattern/deviations: Step-through pattern, Decreased stride length, Decreased step length - right, Decreased step length - left, Trunk flexed, Narrow base of support Gait velocity: decr     General Gait Details: pt with shuffled low steps. overall step length equal at start and pt able to maintain centered position to walker with min assist. Mod assist needed to turn and as gait continued pt had increased difficulty sequencing steps and turning to sit EOB. Mod assist  needed to finish forward and side steps with RW.  Stairs            Wheelchair Mobility    Modified Rankin  (Stroke Patients Only)       Balance Overall balance assessment: Needs assistance Sitting-balance support: Feet supported Sitting balance-Leahy Scale: Good     Standing balance support: During functional activity Standing balance-Leahy Scale: Poor Standing balance comment: pt reliant on outside support                             Pertinent Vitals/Pain Pain Assessment Pain Assessment: No/denies pain    Home Living Family/patient expects to be discharged to:: Private residence Living Arrangements: Alone Available Help at Discharge: Family;Available 24 hours/day Type of Home: House Home Access: Stairs to enter Entrance Stairs-Rails: Right Entrance Stairs-Number of Steps: 4   Home Layout: One level Home Equipment: Shower seat;Toilet riser Additional Comments: lives next door to daughter on one side and granddaughter on the other. Family says they can provide 24 hours assist and they do not want her to go to SNF.    Prior Function Prior Level of Function : Independent/Modified Independent;Driving             Mobility Comments: Pt does not use assistive device ADLs Comments: Mod I with basic adls, sponge bathes most of time.  Family assists with cleaning, meals and all meds and finances. Pt does drive down street to grocery and [facility]. Spoke to pt and daughter about the need for pt to stop driving from OT perspective due to significant cognitive deficits and decreased reaction time.     Hand Dominance   Dominant Hand: Right    Extremity/Trunk Assessment   Upper Extremity Assessment Upper Extremity Assessment: Defer to OT evaluation    Lower Extremity Assessment Lower Extremity Assessment: Generalized weakness    Cervical / Trunk Assessment Cervical / Trunk Assessment: Normal  Communication   Communication: No difficulties  Cognition Arousal/Alertness: Awake/alert Behavior During Therapy: Anxious Overall Cognitive Status: History of cognitive  impairments - at baseline                                 General Comments: daughter states pt at baseline. Pt oriented to self only.  Pt with poor problem solving, poor memory, poor motor planning.  Pt should not be driving a vehicle.        General Comments      Exercises     Assessment/Plan    PT Assessment Patient needs continued PT services  PT Problem List Decreased strength;Decreased activity tolerance;Decreased mobility;Decreased balance;Decreased coordination;Decreased cognition;Decreased knowledge of use of DME;Decreased safety awareness;Decreased knowledge of precautions       PT Treatment Interventions DME instruction;Gait training;Stair training;Functional mobility training;Therapeutic activities;Therapeutic exercise;Balance training;Neuromuscular re-education;Cognitive remediation;Patient/family education    PT Goals (Current goals can be found in the Care Plan section)  Acute Rehab PT Goals Patient Stated Goal: return home and get stronger PT Goal Formulation: With patient/family Time For Goal Achievement: 02/13/22 Potential to Achieve Goals: Fair    Frequency Min 3X/week     Co-evaluation               AM-PAC PT "6 Clicks" Mobility  Outcome Measure Help needed turning from your back to your side while in a flat bed without using bedrails?: A Little Help needed moving from lying on your  back to sitting on the side of a flat bed without using bedrails?: A Little Help needed moving to and from a bed to a chair (including a wheelchair)?: A Little Help needed standing up from a chair using your arms (e.g., wheelchair or bedside chair)?: A Little Help needed to walk in hospital room?: A Lot Help needed climbing 3-5 steps with a railing? : A Lot 6 Click Score: 16    End of Session Equipment Utilized During Treatment: Gait belt Activity Tolerance: Patient tolerated treatment well Patient left: in bed;with call bell/phone within reach;with  family/visitor present Nurse Communication: Mobility status PT Visit Diagnosis: Unsteadiness on feet (R26.81);Muscle weakness (generalized) (M62.81);Difficulty in walking, not elsewhere classified (R26.2)    Time: 1914-7829 PT Time Calculation (min) (ACUTE ONLY): 15 min   Charges:   PT Evaluation $PT Eval Moderate Complexity: 1 Mod         Wynn Maudlin, DPT Acute Rehabilitation Services Office [phone]  01/30/22 12:44 PM

## 2022-01-30 NOTE — ED Notes (Signed)
PT at bedside.

## 2022-01-30 NOTE — ED Notes (Signed)
Pt 2 person assist to bathroom, family at bedside. Call light within reach

## 2022-01-30 NOTE — ED Notes (Signed)
ED TO INPATIENT HANDOFF REPORT  ED Nurse Name and Phone #: Vincente Liberty [phone]  S Name/Age/Gender Christine [name] 89 y.o. female Room/Bed: 040C/040C  Code Status   Code Status: DNR  Home/SNF/Other Home Patient oriented to: self Is this baseline? Yes   Triage Complete: Triage complete  Chief Complaint Syncope [R55]  Triage Note Pt BIB EMS from home, reports pt standing at counter to take evening meds when pt had syncopal episode and legs gave out. States that family caught pt and she did not fall. Pt unresponsive for approx. 1 minute. Denies CP, ShOB. C/o Abd pain on EMS arrival, denies pain now. Found to be in a-fib at 110-150's no prior hx of a-fib. Given 500cc of fluids by EMS.    Allergies Allergies  Allergen Reactions   Atorvastatin     Other reaction(s): hot flashes; felt like walking/leaning to L side; constipation   Citalopram     Other reaction(s): felt it made the inside and outside of her skin burn.it is not clear if this is an allergy or just an adverse reaction of some kind.   Duloxetine Hcl     Other reaction(s): she can't tolerate it and wanted it listed as an allergy   Ezetimibe     Other reaction(s): "had problems with it"   Fluvastatin     Other reaction(s): made legs ache.   Gabapentin Other (See Comments)    hallucinations   Oxybutynin Chloride     Other reaction(s): throat swelling; burning eyelids; nausea   Pravastatin     Other reaction(s): "had problems with it"   Pregabalin     Other reaction(s): made her feel funny   Simvastatin     Other reaction(s): leg pain   Zolpidem Tartrate     Other reaction(s): hallucinations.    Level of Care/Admitting Diagnosis ED Disposition     ED Disposition  Admit   Condition  --   Comment  Hospital Area: [place] [[id number]]  Level of Care: Telemetry Cardiac [[id number]]  May place patient in observation at [facility] or [place] if equivalent level of care is available:: No  Covid  Evaluation: Asymptomatic - no recent exposure (last 10 days) testing not required  Diagnosis: Syncope [206001]  Admitting Physician: Kayleen Memos [[id number]]  Attending Physician: Kayleen Memos [[id number]]          B Medical/Surgery History Past Medical History:  Diagnosis Date   AR (allergic rhinitis)    Arthritis    Bilateral carotid artery stenosis    MILD --  40% BILATERAL ICA PER DOPPLER JULY 2014   Diverticulosis    Frequency of urination    GERD (gastroesophageal reflux disease)    History of colon polyps    ADENOMATOUS   History of Helicobacter pylori infection    History of shingles    History of TIA (transient ischemic attack)    Hyperlipidemia    Hypertension    Impaired memory    Interstitial cystitis    Iron deficiency    Nocturia    Osteopenia    PAF (paroxysmal atrial fibrillation) ([place])    dx 07/ 2015   Pelvic pain in female    Urgency of urination    Vitamin D deficiency    Wears glasses    Past Surgical History:  Procedure Laterality Date   ABDOMINAL HYSTERECTOMY     CATARACT EXTRACTION W/ INTRAOCULAR LENS  IMPLANT, BILATERAL  2009   CYSTO WITH HYDRODISTENSION  02/24/2011  Procedure: CYSTOSCOPY/HYDRODISTENSION;  Surgeon: Reece Packer, MD;  Location: [facility];  Service: Urology;  Laterality: N/A;  INSTILLATION OF marcaine and  pyridium   CYSTO WITH HYDRODISTENSION N/A 06/20/2013   Procedure: CYSTOSCOPY/HYDRODISTENSION/INSTALLATION OF PYRIDIUM -MARCAINE 0.5%.;  Surgeon: Reece Packer, MD;  Location: [place];  Service: Urology;  Laterality: N/A;   CYSTO WITH HYDRODISTENSION N/A 03/22/2014   Procedure: CYSTOSCOPY/HYDRODISTENSION INSTILLATION OF MARCAINE AND PYRDIUM ;  Surgeon: Bjorn Loser, MD;  Location: [place];  Service: Urology;  Laterality: N/A;   CYSTO WITH HYDRODISTENSION N/A 09/17/2014   Procedure: CYSTOSCOPY HYDRODISTENSION INSTILLATION OF MARCAINE AND PYRIDIUM;  Surgeon: Bjorn Loser, MD;  Location: [place];  Service: Urology;  Laterality: N/A;   CYSTO/ HOD/ INSTILLATION THERAPY  10-04-2008  &  05-02-2009   CYSTO/ LEFT URETEROSCOPY W/ LEFT URETERAL DILATION  04-15-2006   TOTAL HIP ARTHROPLASTY Left 05/29/2016   Procedure: TOTAL HIP ARTHROPLASTY ANTERIOR APPROACH;  Surgeon: Meredith Pel, MD;  Location: [place];  Service: Orthopedics;  Laterality: Left;   TRANSTHORACIC ECHOCARDIOGRAM  07-17-2013   grade I diastolic dysfuntion/  ef 84-16%/  mild AR and MR/  trivial TR   URETEROLITHOTOMY Bilateral 11-24-2004 LEFT  &  05-29-2006  RIGHT   VAULT SUSPENSION WITH GRAFT/ CYSTOCELE REPAIR  09-17-2009     A IV Location/Drains/Wounds Patient Lines/Drains/Airways Status     Active Line/Drains/Airways     Name Placement date Placement time Site Days   Peripheral IV 01/29/22 20 G Left Antecubital 01/29/22  2200  Antecubital  1   External Urinary Catheter 01/30/22  0338  --  less than 1            Intake/Output Last 24 hours  Intake/Output Summary (Last 24 hours) at 01/30/2022 1043 Last data filed at 01/30/2022 0743 Gross per 24 hour  Intake 1257.39 ml  Output --  Net 1257.39 ml    Labs/Imaging Results for orders placed or performed during the hospital encounter of 01/29/22 (from the past 48 hour(s))  CBC with Differential/Platelet     Status: Abnormal   Collection Time: 01/29/22 10:08 PM  Result Value Ref Range   WBC 2.4 (L) 4.0 - 10.5 K/uL   RBC 3.87 3.87 - 5.11 MIL/uL   Hemoglobin 11.4 (L) 12.0 - 15.0 g/dL   HCT 35.7 (L) 36.0 - 46.0 %   MCV 92.2 80.0 - 100.0 fL   MCH 29.5 26.0 - 34.0 pg   MCHC 31.9 30.0 - 36.0 g/dL   RDW 16.0 (H) 11.5 - 15.5 %   Platelets 77 (L) 150 - 400 K/uL    Comment: SPECIMEN CHECKED FOR CLOTS Immature Platelet Fraction may be clinically indicated, consider ordering this additional test [id number] REPEATED TO VERIFY PLATELET COUNT CONFIRMED BY SMEAR    nRBC 0.0 0.0 - 0.2 %   Neutrophils Relative %  57 %   Neutro Abs 1.4 (L) 1.7 - 7.7 K/uL   Lymphocytes Relative 21 %   Lymphs Abs 0.5 (L) 0.7 - 4.0 K/uL   Monocytes Relative 22 %   Monocytes Absolute 0.5 0.1 - 1.0 K/uL   Eosinophils Relative 0 %   Eosinophils Absolute 0.0 0.0 - 0.5 K/uL   Basophils Relative 0 %   Basophils Absolute 0.0 0.0 - 0.1 K/uL   Immature Granulocytes 0 %   Abs Immature Granulocytes 0.01 0.00 - 0.07 K/uL    Comment: Performed at [facility], [address], [place], [place] [address]  Basic metabolic panel     Status: Abnormal   Collection Time: 01/29/22 10:08 PM  Result Value Ref Range   Sodium 135 135 - 145 mmol/L   Potassium 3.6 3.5 - 5.1 mmol/L   Chloride 105 98 - 111 mmol/L   CO2 19 (L) 22 - 32 mmol/L   Glucose, Bld 118 (H) 70 - 99 mg/dL    Comment: Glucose reference range applies only to samples taken after fasting for at least 8 hours.   BUN 21 8 - 23 mg/dL   Creatinine, Ser 1.38 (H) 0.44 - 1.00 mg/dL   Calcium 8.1 (L) 8.9 - 10.3 mg/dL   GFR, Estimated 37 (L) >60 mL/min    Comment: (NOTE) Calculated using the CKD-EPI Creatinine Equation (2021)    Anion gap 11 5 - 15    Comment: Performed at [facility] [address], [place], [place] [address]  Troponin I (High Sensitivity)     Status: Abnormal   Collection Time: 01/29/22 10:08 PM  Result Value Ref Range   Troponin I (High Sensitivity) 18 (H) <18 ng/L    Comment: (NOTE) Elevated high sensitivity troponin I (hsTnI) values and significant  changes across serial measurements may suggest ACS but many other  chronic and acute conditions are known to elevate hsTnI results.  Refer to the "Links" section for chest pain algorithms and additional  guidance. Performed at [facility], [place] [address] [place], [place] [address]   Magnesium     Status: None   Collection Time: 01/29/22 10:08 PM  Result Value Ref Range   Magnesium 1.8 1.7 - 2.4 mg/dL    Comment: Performed at [facility] [address] [place],  [place] [address]  Phosphorus     Status: None   Collection Time: 01/29/22 10:08 PM  Result Value Ref Range   Phosphorus 3.4 2.5 - 4.6 mg/dL    Comment: Performed at [facility], [place] [address] [place] [employer] [place], [place] [address]  Resp panel by RT-PCR (RSV, Flu A&B, Covid) Urine, Clean Catch     Status: Abnormal   Collection Time: 01/29/22 11:21 PM   Specimen: Urine, Clean Catch; Nasal Swab  Result Value Ref Range   SARS Coronavirus 2 by RT PCR POSITIVE (A) NEGATIVE    Comment: (NOTE) SARS-CoV-2 target nucleic acids are DETECTED.  The SARS-CoV-2 RNA is generally detectable in upper respiratory specimens during the acute phase of infection. Positive results are indicative of the presence of the identified virus, but do not rule out bacterial infection or co-infection with other pathogens not detected by the test. Clinical correlation with patient history and other diagnostic information is necessary to determine patient infection status. The expected result is Negative.  Fact Sheet for Patients: EntrepreneurPulse.com.au  Fact Sheet for Healthcare Providers: IncredibleEmployment.be  This test is not yet approved or cleared by the [place] FDA and  has been authorized for detection and/or diagnosis of SARS-CoV-2 by FDA under an Emergency Use Authorization (EUA).  This EUA will remain in effect (meaning this test can be used) for the duration of  the COVID-19 declaration under Section 564(b)(1) of the A ct, 21 U.S.C. section 360bbb-3(b)(1), unless the authorization is terminated or revoked sooner.     Influenza A by PCR NEGATIVE NEGATIVE   Influenza B by PCR NEGATIVE NEGATIVE    Comment: (NOTE) The Xpert Xpress SARS-CoV-2/FLU/RSV plus assay is intended as an aid in the diagnosis of influenza from Nasopharyngeal swab specimens and  should not be used as a sole basis for treatment. Nasal washings and aspirates are unacceptable for Xpert Xpress  SARS-CoV-2/FLU/RSV testing.  Fact Sheet for Patients: EntrepreneurPulse.com.au  Fact Sheet for Healthcare Providers: IncredibleEmployment.be  This test is not yet approved or cleared by the [place] FDA and has been authorized for detection and/or diagnosis of SARS-CoV-2 by FDA under an Emergency Use Authorization (EUA). This EUA will remain in effect (meaning this test can be used) for the duration of the COVID-19 declaration under Section 564(b)(1) of the Act, 21 U.S.C. section 360bbb-3(b)(1), unless the authorization is terminated or revoked.     Resp Syncytial Virus by PCR NEGATIVE NEGATIVE    Comment: (NOTE) Fact Sheet for Patients: EntrepreneurPulse.com.au  Fact Sheet for Healthcare Providers: IncredibleEmployment.be  This test is not yet approved or cleared by the [place] FDA and has been authorized for detection and/or diagnosis of SARS-CoV-2 by FDA under an Emergency Use Authorization (EUA). This EUA will remain in effect (meaning this test can be used) for the duration of the COVID-19 declaration under Section 564(b)(1) of the Act, 21 U.S.C. section 360bbb-3(b)(1), unless the authorization is terminated or revoked.  Performed at [facility], [place] [address], [place], [place] [address]   Troponin I (High Sensitivity)     Status: Abnormal   Collection Time: 01/30/22 12:19 AM  Result Value Ref Range   Troponin I (High Sensitivity) 18 (H) <18 ng/L    Comment: (NOTE) Elevated high sensitivity troponin I (hsTnI) values and significant  changes across serial measurements may suggest ACS but many other  chronic and acute conditions are known to elevate hsTnI results.  Refer to the "Links" section for chest pain algorithms and additional  guidance. Performed at [facility], [place] [address] [place], [place] [address]   Urinalysis, Routine w reflex microscopic -Urine, Clean  Catch     Status: Abnormal   Collection Time: 01/30/22 12:21 AM  Result Value Ref Range   Color, Urine YELLOW YELLOW   APPearance CLEAR CLEAR   Specific Gravity, Urine 1.019 1.005 - 1.030   pH 5.0 5.0 - 8.0   Glucose, UA NEGATIVE NEGATIVE mg/dL   Hgb urine dipstick NEGATIVE NEGATIVE   Bilirubin Urine NEGATIVE NEGATIVE   Ketones, ur 20 (A) NEGATIVE mg/dL   Protein, ur 30 (A) NEGATIVE mg/dL   Nitrite NEGATIVE NEGATIVE   Leukocytes,Ua NEGATIVE NEGATIVE   RBC / HPF 0-5 0 - 5 RBC/hpf   WBC, UA 0-5 0 - 5 WBC/hpf   Bacteria, UA NONE SEEN NONE SEEN   Squamous Epithelial / HPF 0-5 0 - 5 /HPF   Mucus PRESENT     Comment: Performed at [facility], [place] [address], [place], [place] [address]  CBC     Status: Abnormal   Collection Time: 01/30/22  5:11 AM  Result Value Ref Range   WBC 2.0 (L) 4.0 - 10.5 K/uL   RBC 3.81 (L) 3.87 - 5.11 MIL/uL   Hemoglobin 11.7 (L) 12.0 - 15.0 g/dL   HCT 34.2 (L) 36.0 - 46.0 %   MCV 89.8 80.0 - 100.0 fL   MCH 30.7 26.0 - 34.0 pg   MCHC 34.2 30.0 - 36.0 g/dL   RDW 15.9 (H) 11.5 - 15.5 %   Platelets 82 (L) 150 - 400 K/uL    Comment: Immature Platelet Fraction may be clinically indicated, consider ordering this additional test [id number] REPEATED TO VERIFY    nRBC 0.0 0.0 - 0.2 %  Comment: Performed at [facility], [place] [address], [place], [place] [address]  Comprehensive metabolic panel     Status: Abnormal   Collection Time: 01/30/22  5:11 AM  Result Value Ref Range   Sodium 137 135 - 145 mmol/L   Potassium 3.2 (L) 3.5 - 5.1 mmol/L   Chloride 106 98 - 111 mmol/L   CO2 21 (L) 22 - 32 mmol/L   Glucose, Bld 106 (H) 70 - 99 mg/dL    Comment: Glucose reference range applies only to samples taken after fasting for at least 8 hours.   BUN 19 8 - 23 mg/dL   Creatinine, Ser 1.28 (H) 0.44 - 1.00 mg/dL   Calcium 8.2 (L) 8.9 - 10.3 mg/dL   Total Protein 6.3 (L) 6.5 - 8.1 g/dL   Albumin 3.6 3.5 - 5.0 g/dL   AST 33 15 - 41 U/L   ALT 25 0 - 44 U/L    Alkaline Phosphatase 28 (L) 38 - 126 U/L   Total Bilirubin 0.6 0.3 - 1.2 mg/dL   GFR, Estimated 41 (L) >60 mL/min    Comment: (NOTE) Calculated using the CKD-EPI Creatinine Equation (2021)    Anion gap 10 5 - 15    Comment: Performed at [facility], [place] [address], [place], [place] [address]  Magnesium     Status: None   Collection Time: 01/30/22  5:11 AM  Result Value Ref Range   Magnesium 2.0 1.7 - 2.4 mg/dL    Comment: Performed at [facility] [address] [place], [place] [address]  Lactic acid, plasma     Status: None   Collection Time: 01/30/22  5:11 AM  Result Value Ref Range   Lactic Acid, Venous 1.1 0.5 - 1.9 mmol/L    Comment: Performed at [facility] [address], [place], [place] [address]  Procalcitonin - Baseline     Status: None   Collection Time: 01/30/22  5:11 AM  Result Value Ref Range   Procalcitonin <0.10 ng/mL    Comment:        Interpretation: PCT (Procalcitonin) <= 0.5 ng/mL: Systemic infection (sepsis) is not likely. Local bacterial infection is possible. (NOTE)       Sepsis PCT Algorithm           Lower Respiratory Tract                                      Infection PCT Algorithm    ----------------------------     ----------------------------         PCT < 0.25 ng/mL                PCT < 0.10 ng/mL          Strongly encourage             Strongly discourage   discontinuation of antibiotics    initiation of antibiotics    ----------------------------     -----------------------------       PCT 0.25 - 0.50 ng/mL            PCT 0.10 - 0.25 ng/mL               OR       >80% decrease in PCT            Discourage initiation of  antibiotics      Encourage discontinuation           of antibiotics    ----------------------------     -----------------------------         PCT >= 0.50 ng/mL              PCT 0.26 - 0.50 ng/mL               AND        <80% decrease in PCT              Encourage initiation of                                             antibiotics       Encourage continuation           of antibiotics    ----------------------------     -----------------------------        PCT >= 0.50 ng/mL                  PCT > 0.50 ng/mL               AND         increase in PCT                  Strongly encourage                                      initiation of antibiotics    Strongly encourage escalation           of antibiotics                                     -----------------------------                                           PCT <= 0.25 ng/mL                                                 OR                                        > 80% decrease in PCT                                      Discontinue / Do not initiate                                             antibiotics  Performed at [facility], [place] [address] [place], [place] [address]    DG Chest Port 1 View  Result Date: 01/29/2022 CLINICAL  DATA:  Syncope EXAM: PORTABLE CHEST 1 VIEW COMPARISON:  03/13/2021 FINDINGS: Mild atelectasis or developing infiltrate at left base. No pleural effusion. Normal cardiomediastinal silhouette with aortic atherosclerosis. No pneumothorax IMPRESSION: Mild atelectasis or developing infiltrate at the left base. Electronically Signed   By: Donavan Foil M.D.   On: 01/29/2022 22:59    Pending Labs Unresulted Labs (From admission, onward)     Start     Ordered   01/30/22 0409  Culture, blood (Routine X 2) w Reflex to ID Panel  BLOOD CULTURE X 2,   R (with TIMED occurrences)      01/30/22 0409            Vitals/Pain Today's Vitals   01/30/22 0200 01/30/22 0315 01/30/22 0737 01/30/22 1014  BP: 108/62 115/72 (!) 133/95 107/66  Pulse: (!) 50 92 91 (!) 108  Resp: 18 17 (!) 21   Temp:  98.7 F (37.1 C) 98.2 F (36.8 C)   TempSrc:  Oral Oral   SpO2: 95% 96% 97%   Weight:      Height:      PainSc:        Isolation Precautions Airborne and  Contact precautions  Medications Medications  remdesivir 200 mg in sodium chloride 0.9% 250 mL IVPB (0 mg Intravenous Stopped 01/30/22 0743)    Followed by  remdesivir 100 mg in sodium chloride 0.9 % 100 mL IVPB (has no administration in time range)  guaiFENesin-dextromethorphan (ROBITUSSIN DM) 100-10 MG/5ML syrup 5 mL (has no administration in time range)  acetaminophen (TYLENOL) tablet 650 mg (has no administration in time range)  melatonin tablet 3 mg (has no administration in time range)  prochlorperazine (COMPAZINE) injection 5 mg (has no administration in time range)  memantine (NAMENDA) tablet 5 mg (5 mg Oral Given by Other 01/30/22 1012)  Rivaroxaban (XARELTO) tablet 15 mg (15 mg Oral Not Given 01/30/22 0909)  cyanocobalamin (VITAMIN B12) tablet 1,000 mcg (1,000 mcg Oral Given by Other 01/30/22 1013)  metoprolol tartrate (LOPRESSOR) tablet 12.5 mg (12.5 mg Oral Given 01/30/22 1014)  lactated ringers infusion ( Intravenous New Bag/Given 01/30/22 0640)  potassium chloride SA (KLOR-CON M) CR tablet 40 mEq (40 mEq Oral Given 01/30/22 1013)  lactated ringers infusion ( Intravenous New Bag/Given 01/30/22 1018)    Mobility walks with person assist     Focused Assessments    R Recommendations: See Admitting Provider Note  Report given to:   Additional Notes: daughter has given home meds, pt has dementia AND STILL DRIVES. Pt has urgency and frequent bathroom requests. Ambulates one person assist. Pt is covid +

## 2022-01-30 NOTE — Progress Notes (Signed)
  Echocardiogram 2D Echocardiogram has been performed.  Christine [name] 01/30/2022, 5:15 PM

## 2022-01-31 DIAGNOSIS — U071 COVID-19: Secondary | ICD-10-CM | POA: Diagnosis not present

## 2022-01-31 DIAGNOSIS — R55 Syncope and collapse: Secondary | ICD-10-CM | POA: Diagnosis not present

## 2022-01-31 DIAGNOSIS — E860 Dehydration: Secondary | ICD-10-CM

## 2022-01-31 LAB — CBC WITH DIFFERENTIAL/PLATELET
Abs Immature Granulocytes: 0 10*3/uL (ref 0.00–0.07)
Basophils Absolute: 0 10*3/uL (ref 0.0–0.1)
Basophils Relative: 1 %
Eosinophils Absolute: 0 10*3/uL (ref 0.0–0.5)
Eosinophils Relative: 0 %
HCT: 30.7 % — ABNORMAL LOW (ref 36.0–46.0)
Hemoglobin: 10.6 g/dL — ABNORMAL LOW (ref 12.0–15.0)
Immature Granulocytes: 0 %
Lymphocytes Relative: 41 %
Lymphs Abs: 0.9 10*3/uL (ref 0.7–4.0)
MCH: 30.3 pg (ref 26.0–34.0)
MCHC: 34.5 g/dL (ref 30.0–36.0)
MCV: 87.7 fL (ref 80.0–100.0)
Monocytes Absolute: 0.3 10*3/uL (ref 0.1–1.0)
Monocytes Relative: 15 %
Neutro Abs: 1 10*3/uL — ABNORMAL LOW (ref 1.7–7.7)
Neutrophils Relative %: 43 %
Platelets: 77 10*3/uL — ABNORMAL LOW (ref 150–400)
RBC: 3.5 MIL/uL — ABNORMAL LOW (ref 3.87–5.11)
RDW: 15.9 % — ABNORMAL HIGH (ref 11.5–15.5)
WBC: 2.2 10*3/uL — ABNORMAL LOW (ref 4.0–10.5)
nRBC: 0 % (ref 0.0–0.2)

## 2022-01-31 LAB — COMPREHENSIVE METABOLIC PANEL
ALT: 35 U/L (ref 0–44)
AST: 42 U/L — ABNORMAL HIGH (ref 15–41)
Albumin: 3.3 g/dL — ABNORMAL LOW (ref 3.5–5.0)
Alkaline Phosphatase: 32 U/L — ABNORMAL LOW (ref 38–126)
Anion gap: 8 (ref 5–15)
BUN: 21 mg/dL (ref 8–23)
CO2: 19 mmol/L — ABNORMAL LOW (ref 22–32)
Calcium: 8.1 mg/dL — ABNORMAL LOW (ref 8.9–10.3)
Chloride: 112 mmol/L — ABNORMAL HIGH (ref 98–111)
Creatinine, Ser: 1.1 mg/dL — ABNORMAL HIGH (ref 0.44–1.00)
GFR, Estimated: 49 mL/min — ABNORMAL LOW (ref >60)
Glucose, Bld: 89 mg/dL (ref 70–99)
Potassium: 3.8 mmol/L (ref 3.5–5.1)
Sodium: 138 mmol/L (ref 135–145)
Total Bilirubin: 0.3 mg/dL (ref 0.3–1.2)
Total Protein: 5.6 g/dL — ABNORMAL LOW (ref 6.5–8.1)

## 2022-01-31 LAB — PHOSPHORUS: Phosphorus: 3.1 mg/dL (ref 2.5–4.6)

## 2022-01-31 LAB — MAGNESIUM: Magnesium: 1.9 mg/dL (ref 1.7–2.4)

## 2022-01-31 MED ORDER — AMOXICILLIN-POT CLAVULANATE 875-125 MG PO TABS: 1.0000 | ORAL_TABLET | Freq: Two times a day (BID) | ORAL | 0 refills | Status: AC

## 2022-01-31 MED ORDER — RIVAROXABAN 15 MG PO TABS
15.0000 mg | ORAL_TABLET | Freq: Every day | ORAL | Status: DC
  Administered 2022-01-31: 15 mg via ORAL
  Filled 2022-01-31: qty 1

## 2022-01-31 MED ORDER — AMOXICILLIN-POT CLAVULANATE 875-125 MG PO TABS
1.0000 | ORAL_TABLET | Freq: Two times a day (BID) | ORAL | Status: DC
  Administered 2022-01-31: 1 via ORAL
  Filled 2022-01-31: qty 1

## 2022-01-31 MED ORDER — GUAIFENESIN-DM 100-10 MG/5ML PO SYRP: 5.0000 mL | ORAL_SOLUTION | ORAL | 0 refills | Status: DC | PRN

## 2022-01-31 NOTE — Discharge Summary (Signed)
Physician Discharge Summary  Christine [name] ZHG:[id number] DOB: [birth year] DOA: 01/29/2022  PCP: Crist Infante, MD  Admit date: 01/29/2022 Discharge date: 01/31/2022  Admitted From: Home Disposition: Home with home health PT  Recommendations for Outpatient Follow-up:  Follow up with PCP in 1-2 weeks Please obtain BMP/CBC in one week   Home Health: PT Equipment/Devices: None  Discharge Condition: Stable CODE STATUS: DNR Diet recommendation: Regular diet, nutritional supplements  Discharge summary: 89 year old from home with history of mild dementia, paroxysmal A-fib on Xarelto, known chronic systolic heart failure with ejection fraction 45% brought to the ER from home due to near syncopal episode witnessed by daughter.  Recently poor oral intake and started coughing.  No fever.  In the emergency room hemodynamically stable.  On room air. COVID-19 was positive.  She was also found to have leukopenia.  Hypokalemia.   # Syncope likely related to hypovolemia: Encourage oral intake.  Treated with low-dose IV fluids.  She did very good clinical recovery and currently asymptomatic.  Orthostatics are negative.  She is euvolemic.  Will benefit with therapies at home.  Discharged with home health therapies. A 2D echocardiogram was done that shows ejection fraction of 45 to 50% which is unchanged from before.  # COVID-19 viral infection: Screening test positive.  Possible left lower lobe infiltrate on chest x-ray.  On room air. She was started on remdesivir.  However with patient being almost asymptomatic today, we discussed with family about continuing remdesivir with no proven benefits so we discontinued.  Mucolytic's and over-the-counter cough medications and Tylenol. She has remained afebrile, on room air with occasional dry cough.   # Paroxysmal A-fib with RVR: Heart rate controlled after restarting home dose of blood pressure.  She is therapeutic on Xarelto.  Leukopenia and thrombocytopenia:  Likely related to acute viral infection.  Currently no evidence of complications or bleeding.  Able to continue Xarelto as platelets are 77,000. Will need to recheck in 1 week to ensure stabilization.  Stable for discharge.  Patient still driving with memory loss.  We had detailed discussion with the patient and family and she is agreeable to not to drive again.   Discharge Diagnoses:  Principal Problem:   Syncope Active Problems:   Syncope and collapse    Discharge Instructions  Discharge Instructions     Diet - low sodium heart healthy   Complete by: As directed    Discharge instructions   Complete by: As directed    Resume blood pressure medicine Losartan after 2 days on 1/29.  Do not drive   Increase activity slowly   Complete by: As directed       Allergies as of 01/31/2022       Reactions   Atorvastatin Other (See Comments)   hot flashes; felt like walking/leaning to L side; constipation   Citalopram Other (See Comments)   felt it made the inside and outside of her skin burn.it is not clear if this is an allergy or just an adverse reaction of some kind.   Duloxetine Hcl Other (See Comments)    she can't tolerate it and wanted it listed as an allergy   Ezetimibe Other (See Comments)   "had problems with it"   Fluvastatin Other (See Comments)    made legs ache.   Gabapentin Other (See Comments)   hallucinations   Oxybutynin Chloride Other (See Comments)   throat swelling; burning eyelids; nausea   Pravastatin Other (See Comments)   "had problems with it"  Pravastatin Other (See Comments)    "had problems with it"   Pregabalin Other (See Comments)    made her feel funny   Simvastatin Other (See Comments)     leg pain   Tape Other (See Comments)    Bruising    Zolpidem Tartrate Other (See Comments)     hallucinations.    Consultations: None   Procedures/Studies: ECHOCARDIOGRAM COMPLETE  Result Date: 01/30/2022    ECHOCARDIOGRAM REPORT   Patient Name:   Christine [name] Boyan Date of Exam: 01/30/2022 Medical Rec #:  [id number]       Height:       64.0 in Accession #:    [id number]      Weight:       124.3 lb Date of Birth:  [birth year]       BSA:          1.599 m Patient Age:    89 years        BP:           109/66 mmHg Patient Gender: F               HR:           93 bpm. Exam Location:  Inpatient Procedure: 2D Echo, Cardiac Doppler and Color Doppler Indications:    Syncope  History:        Patient has prior history of Echocardiogram examinations, most                 recent 03/14/2021. Arrythmias:Atrial Fibrillation; Risk                 Factors:Hypertension and Dyslipidemia. COVID-19.  Sonographer:    Clayton Lefort RDCS (AE) Referring Phys: [id number] Panacea  1. RWMA;s described on TTE done 03/14/21 no longer apparent . Left  ventricular ejection fraction, by estimation, is 55%. The left ventricle has normal function. The left ventricle has no regional wall motion abnormalities. There is mild left ventricular hypertrophy. Left ventricular diastolic parameters are indeterminate.  2. Right ventricular systolic function is normal. The right ventricular size is normal.  3. The mitral valve is abnormal. Trivial mitral valve regurgitation. No evidence of mitral stenosis.  4. The aortic valve is tricuspid. There is moderate calcification of the aortic valve. There is moderate thickening of the aortic valve. Aortic valve regurgitation is trivial. Aortic valve sclerosis/calcification is present, without any evidence of aortic stenosis.  5. The inferior vena cava is dilated in size with >50% respiratory variability, suggesting right atrial pressure of 8 mmHg. FINDINGS  Left Ventricle: RWMA;s described on TTE done 03/14/21 no longer apparent. Left ventricular ejection fraction, by estimation, is 55%. The left ventricle has normal function. The left ventricle has no regional wall motion abnormalities. The left ventricular internal cavity size was normal in size. There is mild left ventricular hypertrophy. Left ventricular diastolic parameters are indeterminate. Right Ventricle: The right ventricular size is normal. No increase in right ventricular wall thickness. Right ventricular systolic function is normal. Left Atrium: Left atrial size was normal in size. Right Atrium: Right atrial size was normal in size. Pericardium: There is no evidence of pericardial effusion. Mitral Valve: The mitral valve is abnormal. There is mild thickening of the mitral valve leaflet(s). There is mild calcification of the mitral valve leaflet(s). Trivial mitral valve regurgitation. No evidence of mitral valve stenosis. Tricuspid Valve: The tricuspid valve is normal in structure. Tricuspid valve regurgitation is mild . No evidence of tricuspid stenosis.  Pravastatin Other (See Comments)    "had problems with it"   Pregabalin Other (See Comments)    made her feel funny   Simvastatin Other (See Comments)     leg pain   Tape Other (See Comments)    Bruising    Zolpidem Tartrate Other (See Comments)     hallucinations.    Consultations: None   Procedures/Studies: ECHOCARDIOGRAM COMPLETE  Result Date: 01/30/2022    ECHOCARDIOGRAM REPORT   Patient Name:   Christine [name] Boyan Date of Exam: 01/30/2022 Medical Rec #:  [id number]       Height:       64.0 in Accession #:    [id number]      Weight:       124.3 lb Date of Birth:  [birth year]       BSA:          1.599 m Patient Age:    89 years        BP:           109/66 mmHg Patient Gender: F               HR:           93 bpm. Exam Location:  Inpatient Procedure: 2D Echo, Cardiac Doppler and Color Doppler Indications:    Syncope  History:        Patient has prior history of Echocardiogram examinations, most                 recent 03/14/2021. Arrythmias:Atrial Fibrillation; Risk                 Factors:Hypertension and Dyslipidemia. COVID-19.  Sonographer:    Clayton Lefort RDCS (AE) Referring Phys: [id number] Panacea  1. RWMA;s described on TTE done 03/14/21 no longer apparent . Left  ventricular ejection fraction, by estimation, is 55%. The left ventricle has normal function. The left ventricle has no regional wall motion abnormalities. There is mild left ventricular hypertrophy. Left ventricular diastolic parameters are indeterminate.  2. Right ventricular systolic function is normal. The right ventricular size is normal.  3. The mitral valve is abnormal. Trivial mitral valve regurgitation. No evidence of mitral stenosis.  4. The aortic valve is tricuspid. There is moderate calcification of the aortic valve. There is moderate thickening of the aortic valve. Aortic valve regurgitation is trivial. Aortic valve sclerosis/calcification is present, without any evidence of aortic stenosis.  5. The inferior vena cava is dilated in size with >50% respiratory variability, suggesting right atrial pressure of 8 mmHg. FINDINGS  Left Ventricle: RWMA;s described on TTE done 03/14/21 no longer apparent. Left ventricular ejection fraction, by estimation, is 55%. The left ventricle has normal function. The left ventricle has no regional wall motion abnormalities. The left ventricular internal cavity size was normal in size. There is mild left ventricular hypertrophy. Left ventricular diastolic parameters are indeterminate. Right Ventricle: The right ventricular size is normal. No increase in right ventricular wall thickness. Right ventricular systolic function is normal. Left Atrium: Left atrial size was normal in size. Right Atrium: Right atrial size was normal in size. Pericardium: There is no evidence of pericardial effusion. Mitral Valve: The mitral valve is abnormal. There is mild thickening of the mitral valve leaflet(s). There is mild calcification of the mitral valve leaflet(s). Trivial mitral valve regurgitation. No evidence of mitral valve stenosis. Tricuspid Valve: The tricuspid valve is normal in structure. Tricuspid valve regurgitation is mild . No evidence of tricuspid stenosis.  Physician Discharge Summary  Christine [name] ZHG:[id number] DOB: [birth year] DOA: 01/29/2022  PCP: Crist Infante, MD  Admit date: 01/29/2022 Discharge date: 01/31/2022  Admitted From: Home Disposition: Home with home health PT  Recommendations for Outpatient Follow-up:  Follow up with PCP in 1-2 weeks Please obtain BMP/CBC in one week   Home Health: PT Equipment/Devices: None  Discharge Condition: Stable CODE STATUS: DNR Diet recommendation: Regular diet, nutritional supplements  Discharge summary: 89 year old from home with history of mild dementia, paroxysmal A-fib on Xarelto, known chronic systolic heart failure with ejection fraction 45% brought to the ER from home due to near syncopal episode witnessed by daughter.  Recently poor oral intake and started coughing.  No fever.  In the emergency room hemodynamically stable.  On room air. COVID-19 was positive.  She was also found to have leukopenia.  Hypokalemia.   # Syncope likely related to hypovolemia: Encourage oral intake.  Treated with low-dose IV fluids.  She did very good clinical recovery and currently asymptomatic.  Orthostatics are negative.  She is euvolemic.  Will benefit with therapies at home.  Discharged with home health therapies. A 2D echocardiogram was done that shows ejection fraction of 45 to 50% which is unchanged from before.  # COVID-19 viral infection: Screening test positive.  Possible left lower lobe infiltrate on chest x-ray.  On room air. She was started on remdesivir.  However with patient being almost asymptomatic today, we discussed with family about continuing remdesivir with no proven benefits so we discontinued.  Mucolytic's and over-the-counter cough medications and Tylenol. She has remained afebrile, on room air with occasional dry cough.   # Paroxysmal A-fib with RVR: Heart rate controlled after restarting home dose of blood pressure.  She is therapeutic on Xarelto.  Leukopenia and thrombocytopenia:  Likely related to acute viral infection.  Currently no evidence of complications or bleeding.  Able to continue Xarelto as platelets are 77,000. Will need to recheck in 1 week to ensure stabilization.  Stable for discharge.  Patient still driving with memory loss.  We had detailed discussion with the patient and family and she is agreeable to not to drive again.   Discharge Diagnoses:  Principal Problem:   Syncope Active Problems:   Syncope and collapse    Discharge Instructions  Discharge Instructions     Diet - low sodium heart healthy   Complete by: As directed    Discharge instructions   Complete by: As directed    Resume blood pressure medicine Losartan after 2 days on 1/29.  Do not drive   Increase activity slowly   Complete by: As directed       Allergies as of 01/31/2022       Reactions   Atorvastatin Other (See Comments)   hot flashes; felt like walking/leaning to L side; constipation   Citalopram Other (See Comments)   felt it made the inside and outside of her skin burn.it is not clear if this is an allergy or just an adverse reaction of some kind.   Duloxetine Hcl Other (See Comments)    she can't tolerate it and wanted it listed as an allergy   Ezetimibe Other (See Comments)   "had problems with it"   Fluvastatin Other (See Comments)    made legs ache.   Gabapentin Other (See Comments)   hallucinations   Oxybutynin Chloride Other (See Comments)   throat swelling; burning eyelids; nausea   Pravastatin Other (See Comments)   "had problems with it"  Physician Discharge Summary  Christine [name] ZHG:[id number] DOB: [birth year] DOA: 01/29/2022  PCP: Crist Infante, MD  Admit date: 01/29/2022 Discharge date: 01/31/2022  Admitted From: Home Disposition: Home with home health PT  Recommendations for Outpatient Follow-up:  Follow up with PCP in 1-2 weeks Please obtain BMP/CBC in one week   Home Health: PT Equipment/Devices: None  Discharge Condition: Stable CODE STATUS: DNR Diet recommendation: Regular diet, nutritional supplements  Discharge summary: 89 year old from home with history of mild dementia, paroxysmal A-fib on Xarelto, known chronic systolic heart failure with ejection fraction 45% brought to the ER from home due to near syncopal episode witnessed by daughter.  Recently poor oral intake and started coughing.  No fever.  In the emergency room hemodynamically stable.  On room air. COVID-19 was positive.  She was also found to have leukopenia.  Hypokalemia.   # Syncope likely related to hypovolemia: Encourage oral intake.  Treated with low-dose IV fluids.  She did very good clinical recovery and currently asymptomatic.  Orthostatics are negative.  She is euvolemic.  Will benefit with therapies at home.  Discharged with home health therapies. A 2D echocardiogram was done that shows ejection fraction of 45 to 50% which is unchanged from before.  # COVID-19 viral infection: Screening test positive.  Possible left lower lobe infiltrate on chest x-ray.  On room air. She was started on remdesivir.  However with patient being almost asymptomatic today, we discussed with family about continuing remdesivir with no proven benefits so we discontinued.  Mucolytic's and over-the-counter cough medications and Tylenol. She has remained afebrile, on room air with occasional dry cough.   # Paroxysmal A-fib with RVR: Heart rate controlled after restarting home dose of blood pressure.  She is therapeutic on Xarelto.  Leukopenia and thrombocytopenia:  Likely related to acute viral infection.  Currently no evidence of complications or bleeding.  Able to continue Xarelto as platelets are 77,000. Will need to recheck in 1 week to ensure stabilization.  Stable for discharge.  Patient still driving with memory loss.  We had detailed discussion with the patient and family and she is agreeable to not to drive again.   Discharge Diagnoses:  Principal Problem:   Syncope Active Problems:   Syncope and collapse    Discharge Instructions  Discharge Instructions     Diet - low sodium heart healthy   Complete by: As directed    Discharge instructions   Complete by: As directed    Resume blood pressure medicine Losartan after 2 days on 1/29.  Do not drive   Increase activity slowly   Complete by: As directed       Allergies as of 01/31/2022       Reactions   Atorvastatin Other (See Comments)   hot flashes; felt like walking/leaning to L side; constipation   Citalopram Other (See Comments)   felt it made the inside and outside of her skin burn.it is not clear if this is an allergy or just an adverse reaction of some kind.   Duloxetine Hcl Other (See Comments)    she can't tolerate it and wanted it listed as an allergy   Ezetimibe Other (See Comments)   "had problems with it"   Fluvastatin Other (See Comments)    made legs ache.   Gabapentin Other (See Comments)   hallucinations   Oxybutynin Chloride Other (See Comments)   throat swelling; burning eyelids; nausea   Pravastatin Other (See Comments)   "had problems with it"  Pravastatin Other (See Comments)    "had problems with it"   Pregabalin Other (See Comments)    made her feel funny   Simvastatin Other (See Comments)     leg pain   Tape Other (See Comments)    Bruising    Zolpidem Tartrate Other (See Comments)     hallucinations.    Consultations: None   Procedures/Studies: ECHOCARDIOGRAM COMPLETE  Result Date: 01/30/2022    ECHOCARDIOGRAM REPORT   Patient Name:   Christine [name] Boyan Date of Exam: 01/30/2022 Medical Rec #:  [id number]       Height:       64.0 in Accession #:    [id number]      Weight:       124.3 lb Date of Birth:  [birth year]       BSA:          1.599 m Patient Age:    89 years        BP:           109/66 mmHg Patient Gender: F               HR:           93 bpm. Exam Location:  Inpatient Procedure: 2D Echo, Cardiac Doppler and Color Doppler Indications:    Syncope  History:        Patient has prior history of Echocardiogram examinations, most                 recent 03/14/2021. Arrythmias:Atrial Fibrillation; Risk                 Factors:Hypertension and Dyslipidemia. COVID-19.  Sonographer:    Clayton Lefort RDCS (AE) Referring Phys: [id number] Panacea  1. RWMA;s described on TTE done 03/14/21 no longer apparent . Left  ventricular ejection fraction, by estimation, is 55%. The left ventricle has normal function. The left ventricle has no regional wall motion abnormalities. There is mild left ventricular hypertrophy. Left ventricular diastolic parameters are indeterminate.  2. Right ventricular systolic function is normal. The right ventricular size is normal.  3. The mitral valve is abnormal. Trivial mitral valve regurgitation. No evidence of mitral stenosis.  4. The aortic valve is tricuspid. There is moderate calcification of the aortic valve. There is moderate thickening of the aortic valve. Aortic valve regurgitation is trivial. Aortic valve sclerosis/calcification is present, without any evidence of aortic stenosis.  5. The inferior vena cava is dilated in size with >50% respiratory variability, suggesting right atrial pressure of 8 mmHg. FINDINGS  Left Ventricle: RWMA;s described on TTE done 03/14/21 no longer apparent. Left ventricular ejection fraction, by estimation, is 55%. The left ventricle has normal function. The left ventricle has no regional wall motion abnormalities. The left ventricular internal cavity size was normal in size. There is mild left ventricular hypertrophy. Left ventricular diastolic parameters are indeterminate. Right Ventricle: The right ventricular size is normal. No increase in right ventricular wall thickness. Right ventricular systolic function is normal. Left Atrium: Left atrial size was normal in size. Right Atrium: Right atrial size was normal in size. Pericardium: There is no evidence of pericardial effusion. Mitral Valve: The mitral valve is abnormal. There is mild thickening of the mitral valve leaflet(s). There is mild calcification of the mitral valve leaflet(s). Trivial mitral valve regurgitation. No evidence of mitral valve stenosis. Tricuspid Valve: The tricuspid valve is normal in structure. Tricuspid valve regurgitation is mild . No evidence of tricuspid stenosis.  Pravastatin Other (See Comments)    "had problems with it"   Pregabalin Other (See Comments)    made her feel funny   Simvastatin Other (See Comments)     leg pain   Tape Other (See Comments)    Bruising    Zolpidem Tartrate Other (See Comments)     hallucinations.    Consultations: None   Procedures/Studies: ECHOCARDIOGRAM COMPLETE  Result Date: 01/30/2022    ECHOCARDIOGRAM REPORT   Patient Name:   Christine [name] Boyan Date of Exam: 01/30/2022 Medical Rec #:  [id number]       Height:       64.0 in Accession #:    [id number]      Weight:       124.3 lb Date of Birth:  [birth year]       BSA:          1.599 m Patient Age:    89 years        BP:           109/66 mmHg Patient Gender: F               HR:           93 bpm. Exam Location:  Inpatient Procedure: 2D Echo, Cardiac Doppler and Color Doppler Indications:    Syncope  History:        Patient has prior history of Echocardiogram examinations, most                 recent 03/14/2021. Arrythmias:Atrial Fibrillation; Risk                 Factors:Hypertension and Dyslipidemia. COVID-19.  Sonographer:    Clayton Lefort RDCS (AE) Referring Phys: [id number] Panacea  1. RWMA;s described on TTE done 03/14/21 no longer apparent . Left  ventricular ejection fraction, by estimation, is 55%. The left ventricle has normal function. The left ventricle has no regional wall motion abnormalities. There is mild left ventricular hypertrophy. Left ventricular diastolic parameters are indeterminate.  2. Right ventricular systolic function is normal. The right ventricular size is normal.  3. The mitral valve is abnormal. Trivial mitral valve regurgitation. No evidence of mitral stenosis.  4. The aortic valve is tricuspid. There is moderate calcification of the aortic valve. There is moderate thickening of the aortic valve. Aortic valve regurgitation is trivial. Aortic valve sclerosis/calcification is present, without any evidence of aortic stenosis.  5. The inferior vena cava is dilated in size with >50% respiratory variability, suggesting right atrial pressure of 8 mmHg. FINDINGS  Left Ventricle: RWMA;s described on TTE done 03/14/21 no longer apparent. Left ventricular ejection fraction, by estimation, is 55%. The left ventricle has normal function. The left ventricle has no regional wall motion abnormalities. The left ventricular internal cavity size was normal in size. There is mild left ventricular hypertrophy. Left ventricular diastolic parameters are indeterminate. Right Ventricle: The right ventricular size is normal. No increase in right ventricular wall thickness. Right ventricular systolic function is normal. Left Atrium: Left atrial size was normal in size. Right Atrium: Right atrial size was normal in size. Pericardium: There is no evidence of pericardial effusion. Mitral Valve: The mitral valve is abnormal. There is mild thickening of the mitral valve leaflet(s). There is mild calcification of the mitral valve leaflet(s). Trivial mitral valve regurgitation. No evidence of mitral valve stenosis. Tricuspid Valve: The tricuspid valve is normal in structure. Tricuspid valve regurgitation is mild . No evidence of tricuspid stenosis.  Pravastatin Other (See Comments)    "had problems with it"   Pregabalin Other (See Comments)    made her feel funny   Simvastatin Other (See Comments)     leg pain   Tape Other (See Comments)    Bruising    Zolpidem Tartrate Other (See Comments)     hallucinations.    Consultations: None   Procedures/Studies: ECHOCARDIOGRAM COMPLETE  Result Date: 01/30/2022    ECHOCARDIOGRAM REPORT   Patient Name:   Christine [name] Boyan Date of Exam: 01/30/2022 Medical Rec #:  [id number]       Height:       64.0 in Accession #:    [id number]      Weight:       124.3 lb Date of Birth:  [birth year]       BSA:          1.599 m Patient Age:    89 years        BP:           109/66 mmHg Patient Gender: F               HR:           93 bpm. Exam Location:  Inpatient Procedure: 2D Echo, Cardiac Doppler and Color Doppler Indications:    Syncope  History:        Patient has prior history of Echocardiogram examinations, most                 recent 03/14/2021. Arrythmias:Atrial Fibrillation; Risk                 Factors:Hypertension and Dyslipidemia. COVID-19.  Sonographer:    Clayton Lefort RDCS (AE) Referring Phys: [id number] Panacea  1. RWMA;s described on TTE done 03/14/21 no longer apparent . Left  ventricular ejection fraction, by estimation, is 55%. The left ventricle has normal function. The left ventricle has no regional wall motion abnormalities. There is mild left ventricular hypertrophy. Left ventricular diastolic parameters are indeterminate.  2. Right ventricular systolic function is normal. The right ventricular size is normal.  3. The mitral valve is abnormal. Trivial mitral valve regurgitation. No evidence of mitral stenosis.  4. The aortic valve is tricuspid. There is moderate calcification of the aortic valve. There is moderate thickening of the aortic valve. Aortic valve regurgitation is trivial. Aortic valve sclerosis/calcification is present, without any evidence of aortic stenosis.  5. The inferior vena cava is dilated in size with >50% respiratory variability, suggesting right atrial pressure of 8 mmHg. FINDINGS  Left Ventricle: RWMA;s described on TTE done 03/14/21 no longer apparent. Left ventricular ejection fraction, by estimation, is 55%. The left ventricle has normal function. The left ventricle has no regional wall motion abnormalities. The left ventricular internal cavity size was normal in size. There is mild left ventricular hypertrophy. Left ventricular diastolic parameters are indeterminate. Right Ventricle: The right ventricular size is normal. No increase in right ventricular wall thickness. Right ventricular systolic function is normal. Left Atrium: Left atrial size was normal in size. Right Atrium: Right atrial size was normal in size. Pericardium: There is no evidence of pericardial effusion. Mitral Valve: The mitral valve is abnormal. There is mild thickening of the mitral valve leaflet(s). There is mild calcification of the mitral valve leaflet(s). Trivial mitral valve regurgitation. No evidence of mitral valve stenosis. Tricuspid Valve: The tricuspid valve is normal in structure. Tricuspid valve regurgitation is mild . No evidence of tricuspid stenosis.

## 2022-01-31 NOTE — TOC Transition Note (Signed)
Transition of Care [facility]) - CM/SW Discharge Note   Patient Details  Name: Christine [name] MRN: [id number] Date of Birth: [birth year]  Transition of Care [facility] CM/SW Contact:  Bartholomew Crews, RN Phone Number: [phone] 01/31/2022, 11:42 AM   Clinical Narrative:     Patient to transition home today. Notified liaison at [facility] [place] orders. No further TOC needs identified at this time.   Final next level of care: Home w Home Health Services Barriers to Discharge: No Barriers Identified   Patient Goals and CMS Choice CMS Medicare.gov Compare Post Acute Care list provided to:: Patient Choice offered to / list presented to : Adult Children  Discharge Placement                         Discharge Plan and Services Additional resources added to the After Visit Summary for     Discharge Planning Services: CM Consult Post Acute Care Choice: Home Health          DME Arranged: N/A         HH Arranged: PT, OT HH Agency: [place] Date [place]: 01/31/22 Time [place]: 1142 Representative spoke with at [place]: [place] Determinants of Health ([place]) Interventions SDOH Screenings   Tobacco Use: Low Risk  (01/29/2022)     Readmission Risk Interventions     No data to display

## 2022-02-04 LAB — CULTURE, BLOOD (ROUTINE X 2)
Culture: NO GROWTH
Culture: NO GROWTH
Special Requests: ADEQUATE

## 2022-02-05 DIAGNOSIS — G4700 Insomnia, unspecified: Secondary | ICD-10-CM | POA: Diagnosis not present

## 2022-02-05 DIAGNOSIS — K5730 Diverticulosis of large intestine without perforation or abscess without bleeding: Secondary | ICD-10-CM | POA: Diagnosis not present

## 2022-02-05 DIAGNOSIS — K3189 Other diseases of stomach and duodenum: Secondary | ICD-10-CM | POA: Diagnosis not present

## 2022-02-05 DIAGNOSIS — G894 Chronic pain syndrome: Secondary | ICD-10-CM | POA: Diagnosis not present

## 2022-02-05 DIAGNOSIS — I5042 Chronic combined systolic (congestive) and diastolic (congestive) heart failure: Secondary | ICD-10-CM | POA: Diagnosis not present

## 2022-02-05 DIAGNOSIS — E785 Hyperlipidemia, unspecified: Secondary | ICD-10-CM | POA: Diagnosis not present

## 2022-02-05 DIAGNOSIS — E538 Deficiency of other specified B group vitamins: Secondary | ICD-10-CM | POA: Diagnosis not present

## 2022-02-05 DIAGNOSIS — I480 Paroxysmal atrial fibrillation: Secondary | ICD-10-CM | POA: Diagnosis not present

## 2022-02-05 DIAGNOSIS — K219 Gastro-esophageal reflux disease without esophagitis: Secondary | ICD-10-CM | POA: Diagnosis not present

## 2022-02-05 DIAGNOSIS — I2510 Atherosclerotic heart disease of native coronary artery without angina pectoris: Secondary | ICD-10-CM | POA: Diagnosis not present

## 2022-02-05 DIAGNOSIS — I110 Hypertensive heart disease with heart failure: Secondary | ICD-10-CM | POA: Diagnosis not present

## 2022-02-05 DIAGNOSIS — F0283 Dementia in other diseases classified elsewhere, unspecified severity, with mood disturbance: Secondary | ICD-10-CM | POA: Diagnosis not present

## 2022-02-05 DIAGNOSIS — U071 COVID-19: Secondary | ICD-10-CM | POA: Diagnosis not present

## 2022-02-05 DIAGNOSIS — N318 Other neuromuscular dysfunction of bladder: Secondary | ICD-10-CM | POA: Diagnosis not present

## 2022-02-05 DIAGNOSIS — M1990 Unspecified osteoarthritis, unspecified site: Secondary | ICD-10-CM | POA: Diagnosis not present

## 2022-02-05 DIAGNOSIS — H6123 Impacted cerumen, bilateral: Secondary | ICD-10-CM | POA: Diagnosis not present

## 2022-02-05 DIAGNOSIS — I252 Old myocardial infarction: Secondary | ICD-10-CM | POA: Diagnosis not present

## 2022-02-05 DIAGNOSIS — F329 Major depressive disorder, single episode, unspecified: Secondary | ICD-10-CM | POA: Diagnosis not present

## 2022-02-05 DIAGNOSIS — D509 Iron deficiency anemia, unspecified: Secondary | ICD-10-CM | POA: Diagnosis not present

## 2022-02-05 DIAGNOSIS — M810 Age-related osteoporosis without current pathological fracture: Secondary | ICD-10-CM | POA: Diagnosis not present

## 2022-02-05 DIAGNOSIS — F0284 Dementia in other diseases classified elsewhere, unspecified severity, with anxiety: Secondary | ICD-10-CM | POA: Diagnosis not present

## 2022-02-05 DIAGNOSIS — K5900 Constipation, unspecified: Secondary | ICD-10-CM | POA: Diagnosis not present

## 2022-02-05 DIAGNOSIS — E876 Hypokalemia: Secondary | ICD-10-CM | POA: Diagnosis not present

## 2022-02-05 DIAGNOSIS — H6120 Impacted cerumen, unspecified ear: Secondary | ICD-10-CM | POA: Diagnosis not present

## 2022-02-09 DIAGNOSIS — J189 Pneumonia, unspecified organism: Secondary | ICD-10-CM | POA: Diagnosis not present

## 2022-02-09 DIAGNOSIS — K5900 Constipation, unspecified: Secondary | ICD-10-CM | POA: Diagnosis not present

## 2022-02-09 DIAGNOSIS — U071 COVID-19: Secondary | ICD-10-CM | POA: Diagnosis not present

## 2022-02-09 DIAGNOSIS — I480 Paroxysmal atrial fibrillation: Secondary | ICD-10-CM | POA: Diagnosis not present

## 2022-02-12 ENCOUNTER — Other Ambulatory Visit: Payer: Self-pay | Admitting: Pharmacy Technician

## 2022-03-16 ENCOUNTER — Ambulatory Visit (INDEPENDENT_AMBULATORY_CARE_PROVIDER_SITE_OTHER): Payer: PPO

## 2022-03-16 ENCOUNTER — Encounter: Payer: Self-pay | Admitting: Orthopedic Surgery

## 2022-03-16 ENCOUNTER — Ambulatory Visit (INDEPENDENT_AMBULATORY_CARE_PROVIDER_SITE_OTHER): Payer: PPO | Admitting: Orthopedic Surgery

## 2022-03-16 DIAGNOSIS — M79605 Pain in left leg: Secondary | ICD-10-CM

## 2022-03-16 NOTE — Progress Notes (Signed)
Office Visit Note   Patient: Christine [name]           Date of Birth: November 19, 1933           MRN: [id number] Visit Date: 03/16/2022 Requested by: Rodrigo Ran, MD [address] [place],  [place] [address] PCP: Rodrigo Ran, MD  Subjective: Chief Complaint  Patient presents with   Left Leg - Pain    HPI: Christine [name] is a 89 y.o. female who presents to the office reporting episodic left leg pain.  Reports some posterior leg pain from the posterior aspect of the knee down to the ankle.  Started 2 days ago in the morning.  Was unable to apply much pressure to it on Saturday but did improve on Sunday.  Daughter states that her gait was still a little bit off.  She did use a walker yesterday.  No definite history of injury.  Has been taking Ultram for symptoms.  Patient has dementia but lives alone and has caregivers during the day.  Reports some catching in the left leg.  She does have a history of left hip replacement.  She is on Xarelto.  No history of gout or pseudogout..                ROS: All systems reviewed are negative as they relate to the chief complaint within the history of present illness.  Patient denies fevers or chills.  Assessment & Plan: Visit Diagnoses:  1. Pain in left leg     Plan: Impression is left leg and knee pain with an effusion in the left knee but no effusion in the right knee.  Radiographs of the tib-fib region are normal.  I think this could be referred pain from the knee.  Alternatively she may have had a ruptured Baker's cyst.  We talked about aspirating and injecting the knee today but she wants to hold off on that intervention.  Her gait is actually pretty normal.  She will follow-up as needed but knee aspiration and injection would be the next step.  Follow-Up Instructions: No follow-ups on file.   Orders:  Orders Placed This Encounter  Procedures   XR Tibia/Fibula Left   No orders of the defined types were placed in this encounter.      Procedures: No procedures performed   Clinical Data: No additional findings.  Objective: Vital Signs: There were no vitals taken for this visit.  Physical Exam:  Constitutional: Patient appears well-developed HEENT:  Head: Normocephalic Eyes:EOM are normal Neck: Normal range of motion Cardiovascular: Normal rate Pulmonary/chest: Effort normal Neurologic: Patient is alert Skin: Skin is warm Psychiatric: Patient has normal mood and affect  Ortho Exam: Ortho exam demonstrates perfused and sensate feet.  Ankle dorsiflexion intact bilaterally.  No groin pain with internal/external rotation of either leg.  Mild effusion in the left knee is present.  Collateral crucial ligaments are stable.  No tenderness to palpation along the entire tibial crest and fibula.  Specialty Comments:  No specialty comments available.  Imaging: No results found.   PMFS History: Patient Active Problem List   Diagnosis Date Noted   Syncope 01/30/2022   Syncope and collapse 01/30/2022   Malnutrition of moderate degree 03/15/2021   Vitamin B12 deficiency 03/14/2021   Dyslipidemia 03/14/2021   Recurrent falls 03/14/2021   NSTEMI (non-ST elevated myocardial infarction) (HCC) 03/13/2021   History of kidney stones 10/23/2020   Dementia without behavioral disturbance (HCC) 08/30/2020   Pain in right knee  08/30/2020   Vitamin D deficiency 08/30/2020   Bilateral impacted cerumen 08/22/2019   Thrombophilia (HCC) 03/02/2019   Increased frequency of urination 10/14/2017   Impacted cerumen 08/04/2016   Age-related osteoporosis without current pathological fracture 07/06/2016   Hip fracture (HCC) 05/29/2016   Closed left hip fracture (HCC) 05/27/2016   Insomnia 12/17/2015   Memory loss 12/17/2015   Dysuria 12/04/2015   AKI (acute kidney injury) (HCC) 10/01/2015   Acute encephalopathy 10/01/2015   Interstitial cystitis 10/01/2015   Essential hypertension 10/01/2015   Rhabdomyolysis 09/30/2015   Major  depression, single episode 07/10/2015   Non-thrombocytopenic purpura (HCC) 07/10/2015   Encounter for general adult medical examination without abnormal findings 11/30/2014   Fall 08/07/2013   Pain in left leg 08/07/2013   UTI (lower urinary tract infection) 07/16/2013   Paroxysmal atrial fibrillation (HCC) 07/16/2013   Chest pain 07/16/2013   Transient ischemic attack 07/04/2012   Chronic pain syndrome 03/28/2012   Residual hemorrhoidal skin tags 01/18/2012   Skin sensation disturbance 03/03/2010   ABDOMINAL PAIN, UNSPECIFIED SITE 02/03/2010   Pain in limb 10/07/2009   Anemia 03/26/2009   Allergic rhinitis 02/27/2009   Anxiety disorder 02/27/2009   Hyperlipidemia 02/27/2009   Low compliance bladder 02/27/2009   DYSPEPSIA 01/18/2008   ADENOMATOUS COLONIC POLYP 01/17/2008   HEMORRHOIDS, INTERNAL 01/17/2008   ESOPHAGITIS 01/17/2008   DIVERTICULOSIS, COLON 01/17/2008   HEMORRHOIDS-EXTERNAL 12/07/2007   GERD 12/07/2007   Constipation 12/07/2007   RECTAL BLEEDING 12/07/2007   Loss of weight 12/07/2007   Nausea alone 12/07/2007   HEARTBURN 12/07/2007   ABDOMINAL BLOATING 12/07/2007   PERSONAL HX COLONIC POLYPS 12/07/2007   Past Medical History:  Diagnosis Date   AR (allergic rhinitis)    Arthritis    Bilateral carotid artery stenosis    MILD --  40% BILATERAL ICA PER DOPPLER JULY 2014   Diverticulosis    Frequency of urination    GERD (gastroesophageal reflux disease)    History of colon polyps    ADENOMATOUS   History of Helicobacter pylori infection    History of shingles    History of TIA (transient ischemic attack)    Hyperlipidemia    Hypertension    Impaired memory    Interstitial cystitis    Iron deficiency    Nocturia    Osteopenia    PAF (paroxysmal atrial fibrillation) (HCC)    dx 07/ 2015   Pelvic pain in female    Urgency of urination    Vitamin D deficiency    Wears glasses     No family history on file.  Past Surgical History:  Procedure  Laterality Date   ABDOMINAL HYSTERECTOMY     CATARACT EXTRACTION W/ INTRAOCULAR LENS  IMPLANT, BILATERAL  2009   CYSTO WITH HYDRODISTENSION  02/24/2011   Procedure: CYSTOSCOPY/HYDRODISTENSION;  Surgeon: Martina Sinner, MD;  Location: [facility];  Service: Urology;  Laterality: N/A;  INSTILLATION OF marcaine and  pyridium   CYSTO WITH HYDRODISTENSION N/A 06/20/2013   Procedure: CYSTOSCOPY/HYDRODISTENSION/INSTALLATION OF PYRIDIUM -MARCAINE 0.5%.;  Surgeon: Martina Sinner, MD;  Location[facility];  Service: Urology;  Laterality: N/A;   CYSTO WITH HYDRODISTENSION N/A 03/22/2014   Procedure: CYSTOSCOPY/HYDRODISTENSION INSTILLATION OF MARCAINE AND PYRDIUM ;  Surgeon: Alfredo Martinez, MD;  Location: [facility] ;  Service: Urology;  Laterality: N/A;   CYSTO WITH HYDRODISTENSION N/A 09/17/2014   Procedure: CYSTOSCOPY HYDRODISTENSION INSTILLATION OF MARCAINE AND PYRIDIUM;  Surgeon: Alfredo Martinez, MD;  Location: [facility];  Service: Urology;  Laterality: N/A;   CYSTO/ HOD/ INSTILLATION THERAPY  10-04-2008  &  05-02-2009   CYSTO/ LEFT URETEROSCOPY W/ LEFT URETERAL DILATION  04-15-2006   TOTAL HIP ARTHROPLASTY Left 05/29/2016   Procedure: TOTAL HIP ARTHROPLASTY ANTERIOR APPROACH;  Surgeon: Cammy Copa, MD;  Location: [facility] OR;  Service: Orthopedics;  Laterality: Left;   TRANSTHORACIC ECHOCARDIOGRAM  07-17-2013   grade I diastolic dysfuntion/  ef 60-65%/  mild AR and MR/  trivial TR   URETEROLITHOTOMY Bilateral 11-24-2004 LEFT  &  05-29-2006  RIGHT   VAULT SUSPENSION WITH GRAFT/ CYSTOCELE REPAIR  09-17-2009   Social History   Occupational History   Not on file  Tobacco Use   Smoking status: Never   Smokeless tobacco: Never  Substance and Sexual Activity   Alcohol use: No   Drug use: No   Sexual activity: Not on file

## 2022-03-26 DIAGNOSIS — Z85828 Personal history of other malignant neoplasm of skin: Secondary | ICD-10-CM | POA: Diagnosis not present

## 2022-03-26 DIAGNOSIS — D0439 Carcinoma in situ of skin of other parts of face: Secondary | ICD-10-CM | POA: Diagnosis not present

## 2022-03-26 DIAGNOSIS — L820 Inflamed seborrheic keratosis: Secondary | ICD-10-CM | POA: Diagnosis not present

## 2022-03-26 DIAGNOSIS — D485 Neoplasm of uncertain behavior of skin: Secondary | ICD-10-CM | POA: Diagnosis not present

## 2022-04-09 ENCOUNTER — Encounter: Payer: Self-pay | Admitting: Podiatry

## 2022-04-09 ENCOUNTER — Ambulatory Visit: Payer: PPO | Admitting: Podiatry

## 2022-04-09 DIAGNOSIS — B351 Tinea unguium: Secondary | ICD-10-CM | POA: Diagnosis not present

## 2022-04-09 DIAGNOSIS — M79675 Pain in left toe(s): Secondary | ICD-10-CM | POA: Diagnosis not present

## 2022-04-09 DIAGNOSIS — M79674 Pain in right toe(s): Secondary | ICD-10-CM

## 2022-04-09 NOTE — Progress Notes (Signed)
Subjective:   Patient ID: Christine [name], female   DOB: 89 y.o.   MRN: [id number]   HPI Patient presents with caregiver with severely elongated nailbeds 1-5 both feet thickened and incurvated   ROS      Objective:  Physical Exam  Neurovascular status intact thick yellow brittle nailbeds 1-5 both feet moderate discomfort associated with them with excessive length chronic mycotic     Assessment:  Nail infection with excessive leg nailbeds 1-5 both feet     Plan:  Debridement nailbeds 1-5 both feet slight bit of bleeding on the distal portion right hallux with Band-Aid applied and patient will be seen back as needed for conservative

## 2022-05-28 DIAGNOSIS — E559 Vitamin D deficiency, unspecified: Secondary | ICD-10-CM | POA: Diagnosis not present

## 2022-05-28 DIAGNOSIS — I10 Essential (primary) hypertension: Secondary | ICD-10-CM | POA: Diagnosis not present

## 2022-05-28 DIAGNOSIS — R7989 Other specified abnormal findings of blood chemistry: Secondary | ICD-10-CM | POA: Diagnosis not present

## 2022-05-28 DIAGNOSIS — K219 Gastro-esophageal reflux disease without esophagitis: Secondary | ICD-10-CM | POA: Diagnosis not present

## 2022-05-28 DIAGNOSIS — E785 Hyperlipidemia, unspecified: Secondary | ICD-10-CM | POA: Diagnosis not present

## 2022-05-28 DIAGNOSIS — D649 Anemia, unspecified: Secondary | ICD-10-CM | POA: Diagnosis not present

## 2022-06-04 DIAGNOSIS — D692 Other nonthrombocytopenic purpura: Secondary | ICD-10-CM | POA: Diagnosis not present

## 2022-06-04 DIAGNOSIS — E785 Hyperlipidemia, unspecified: Secondary | ICD-10-CM | POA: Diagnosis not present

## 2022-06-04 DIAGNOSIS — M810 Age-related osteoporosis without current pathological fracture: Secondary | ICD-10-CM | POA: Diagnosis not present

## 2022-06-04 DIAGNOSIS — I130 Hypertensive heart and chronic kidney disease with heart failure and stage 1 through stage 4 chronic kidney disease, or unspecified chronic kidney disease: Secondary | ICD-10-CM | POA: Diagnosis not present

## 2022-06-04 DIAGNOSIS — F0390 Unspecified dementia without behavioral disturbance: Secondary | ICD-10-CM | POA: Diagnosis not present

## 2022-06-04 DIAGNOSIS — Z0000 Encounter for general adult medical examination without abnormal findings: Secondary | ICD-10-CM | POA: Diagnosis not present

## 2022-06-04 DIAGNOSIS — N1831 Chronic kidney disease, stage 3a: Secondary | ICD-10-CM | POA: Diagnosis not present

## 2022-06-04 DIAGNOSIS — R82998 Other abnormal findings in urine: Secondary | ICD-10-CM | POA: Diagnosis not present

## 2022-06-04 DIAGNOSIS — Z8673 Personal history of transient ischemic attack (TIA), and cerebral infarction without residual deficits: Secondary | ICD-10-CM | POA: Diagnosis not present

## 2022-06-04 DIAGNOSIS — I5022 Chronic systolic (congestive) heart failure: Secondary | ICD-10-CM | POA: Diagnosis not present

## 2022-06-04 DIAGNOSIS — D6869 Other thrombophilia: Secondary | ICD-10-CM | POA: Diagnosis not present

## 2022-06-04 DIAGNOSIS — I10 Essential (primary) hypertension: Secondary | ICD-10-CM | POA: Diagnosis not present

## 2022-07-20 DIAGNOSIS — L812 Freckles: Secondary | ICD-10-CM | POA: Diagnosis not present

## 2022-07-20 DIAGNOSIS — D1801 Hemangioma of skin and subcutaneous tissue: Secondary | ICD-10-CM | POA: Diagnosis not present

## 2022-07-20 DIAGNOSIS — D692 Other nonthrombocytopenic purpura: Secondary | ICD-10-CM | POA: Diagnosis not present

## 2022-07-20 DIAGNOSIS — L821 Other seborrheic keratosis: Secondary | ICD-10-CM | POA: Diagnosis not present

## 2022-07-20 DIAGNOSIS — Z85828 Personal history of other malignant neoplasm of skin: Secondary | ICD-10-CM | POA: Diagnosis not present

## 2022-07-20 DIAGNOSIS — D225 Melanocytic nevi of trunk: Secondary | ICD-10-CM | POA: Diagnosis not present

## 2022-07-21 ENCOUNTER — Other Ambulatory Visit (HOSPITAL_COMMUNITY): Payer: Self-pay

## 2022-07-23 ENCOUNTER — Ambulatory Visit (HOSPITAL_COMMUNITY)
Admission: RE | Admit: 2022-07-23 | Discharge: 2022-07-23 | Disposition: A | Discharge status: Home / Self Care | Payer: PPO | Source: Ambulatory Visit | Attending: Internal Medicine | Admitting: Internal Medicine

## 2022-07-23 DIAGNOSIS — H6121 Impacted cerumen, right ear: Secondary | ICD-10-CM | POA: Diagnosis not present

## 2022-07-23 DIAGNOSIS — M810 Age-related osteoporosis without current pathological fracture: Secondary | ICD-10-CM | POA: Diagnosis not present

## 2022-07-23 MED ORDER — DENOSUMAB 60 MG/ML ~~LOC~~ SOSY
PREFILLED_SYRINGE | SUBCUTANEOUS | Status: AC
  Filled 2022-07-23: qty 1

## 2022-07-23 MED ORDER — DENOSUMAB 60 MG/ML ~~LOC~~ SOSY
60.0000 mg | PREFILLED_SYRINGE | Freq: Once | SUBCUTANEOUS | Status: AC
  Administered 2022-07-23: 60 mg via SUBCUTANEOUS

## 2022-08-17 DIAGNOSIS — N3010 Interstitial cystitis (chronic) without hematuria: Secondary | ICD-10-CM | POA: Diagnosis not present

## 2022-08-17 DIAGNOSIS — M810 Age-related osteoporosis without current pathological fracture: Secondary | ICD-10-CM | POA: Diagnosis not present

## 2022-08-17 DIAGNOSIS — I10 Essential (primary) hypertension: Secondary | ICD-10-CM | POA: Diagnosis not present

## 2022-08-17 DIAGNOSIS — D692 Other nonthrombocytopenic purpura: Secondary | ICD-10-CM | POA: Diagnosis not present

## 2022-08-17 DIAGNOSIS — F0390 Unspecified dementia without behavioral disturbance: Secondary | ICD-10-CM | POA: Diagnosis not present

## 2022-08-17 DIAGNOSIS — I480 Paroxysmal atrial fibrillation: Secondary | ICD-10-CM | POA: Diagnosis not present

## 2022-09-15 ENCOUNTER — Encounter (HOSPITAL_BASED_OUTPATIENT_CLINIC_OR_DEPARTMENT_OTHER): Payer: Self-pay | Admitting: Emergency Medicine

## 2022-09-15 ENCOUNTER — Emergency Department (HOSPITAL_BASED_OUTPATIENT_CLINIC_OR_DEPARTMENT_OTHER): Payer: PPO | Admitting: Radiology

## 2022-09-15 ENCOUNTER — Emergency Department (HOSPITAL_BASED_OUTPATIENT_CLINIC_OR_DEPARTMENT_OTHER): Payer: PPO

## 2022-09-15 ENCOUNTER — Emergency Department (HOSPITAL_BASED_OUTPATIENT_CLINIC_OR_DEPARTMENT_OTHER)
Admission: EM | Admit: 2022-09-15 | Discharge: 2022-09-15 | Disposition: A | Discharge status: Home / Self Care | Payer: PPO | Attending: Emergency Medicine | Admitting: Emergency Medicine

## 2022-09-15 ENCOUNTER — Other Ambulatory Visit: Payer: Self-pay

## 2022-09-15 DIAGNOSIS — Z7901 Long term (current) use of anticoagulants: Secondary | ICD-10-CM | POA: Insufficient documentation

## 2022-09-15 DIAGNOSIS — I10 Essential (primary) hypertension: Secondary | ICD-10-CM | POA: Insufficient documentation

## 2022-09-15 DIAGNOSIS — S62336A Displaced fracture of neck of fifth metacarpal bone, right hand, initial encounter for closed fracture: Secondary | ICD-10-CM | POA: Diagnosis not present

## 2022-09-15 DIAGNOSIS — M79642 Pain in left hand: Secondary | ICD-10-CM | POA: Insufficient documentation

## 2022-09-15 DIAGNOSIS — S62331A Displaced fracture of neck of second metacarpal bone, left hand, initial encounter for closed fracture: Secondary | ICD-10-CM | POA: Insufficient documentation

## 2022-09-15 DIAGNOSIS — I6782 Cerebral ischemia: Secondary | ICD-10-CM | POA: Diagnosis not present

## 2022-09-15 DIAGNOSIS — W010XXA Fall on same level from slipping, tripping and stumbling without subsequent striking against object, initial encounter: Secondary | ICD-10-CM | POA: Insufficient documentation

## 2022-09-15 DIAGNOSIS — S6992XA Unspecified injury of left wrist, hand and finger(s), initial encounter: Secondary | ICD-10-CM | POA: Diagnosis present

## 2022-09-15 DIAGNOSIS — Z79899 Other long term (current) drug therapy: Secondary | ICD-10-CM | POA: Insufficient documentation

## 2022-09-15 DIAGNOSIS — M19042 Primary osteoarthritis, left hand: Secondary | ICD-10-CM | POA: Diagnosis not present

## 2022-09-15 DIAGNOSIS — Y9301 Activity, walking, marching and hiking: Secondary | ICD-10-CM | POA: Diagnosis not present

## 2022-09-15 DIAGNOSIS — W19XXXA Unspecified fall, initial encounter: Secondary | ICD-10-CM

## 2022-09-15 NOTE — ED Provider Notes (Signed)
[place] EMERGENCY DEPARTMENT AT [facility] Provider Note   CSN: [id number] Arrival date & time: 09/15/22  1648     History {Add pertinent medical, surgical, social history, OB history to HPI:1} Chief Complaint  Patient presents with   Christine [name] is a 89 y.o. female.  HPI     Home Medications Prior to Admission medications   Medication Sig Start Date End Date Taking? Authorizing Provider  acetaminophen (TYLENOL) 500 MG tablet Take 1,000 mg by mouth as needed for moderate pain.    [provider]  Cholecalciferol (VITAMIN D3) 1000 UNITS CAPS Take 1,000 Units by mouth daily.     [provider]  denosumab (PROLIA) 60 MG/ML SOSY injection Inject 60 mg into the skin every 6 (six) months.    [provider]  donepezil (ARICEPT) 10 MG tablet Take 10 mg by mouth at bedtime. 08/29/20   [provider]  guaiFENesin-dextromethorphan (ROBITUSSIN DM) 100-10 MG/5ML syrup Take 5 mLs by mouth every 4 (four) hours as needed for cough. 01/31/22   Dorcas Carrow, MD  hydrOXYzine (ATARAX/VISTARIL) 25 MG tablet Take 50 mg by mouth at bedtime.  08/27/15   [provider]  LORazepam (ATIVAN) 1 MG tablet Take 1 mg by mouth at bedtime. 12/23/17   [provider]  losartan (COZAAR) 50 MG tablet Take 50 mg by mouth daily.    [provider]  memantine (NAMENDA) 5 MG tablet Take 5 mg by mouth daily.    [provider]  metoprolol tartrate (LOPRESSOR) 25 MG tablet Take 0.5 tablets (12.5 mg total) by mouth 2 (two) times daily. Patient not taking: Reported on 01/30/2022 03/15/21   Azucena Fallen, MD  mirabegron ER (MYRBETRIQ) 50 MG TB24 tablet Take 50 mg by mouth daily. 12/25/14   [provider]  pantoprazole (PROTONIX) 40 MG tablet Take 40 mg by mouth daily.    [provider]  pentosan polysulfate (ELMIRON) 100 MG capsule Take 100 mg by mouth daily. 08/27/15   [provider]   Polyethylene Glycol 3350 (MIRALAX PO) Take 17 g by mouth daily as needed (constipation). 10/07/09   [provider]  PRALUENT 150 MG/ML SOAJ Inject 150 mg into the skin every 21 ( twenty-one) days. 02/11/18   [provider]  Rivaroxaban (XARELTO) 15 MG TABS tablet Take 1 tablet (15 mg total) by mouth daily with supper. Patient taking differently: Take 15 mg by mouth daily. 07/17/13   Esperanza Sheets, MD  traMADol (ULTRAM) 50 MG tablet Take 50 mg by mouth as needed for moderate pain. 01/31/18   [provider]  vitamin B-12 (CYANOCOBALAMIN) 1000 MCG tablet Take 1,000 mcg by mouth daily.    [provider]      Allergies    Atorvastatin, Citalopram, Duloxetine hcl, Ezetimibe, Fluvastatin, Gabapentin, Oxybutynin chloride, Pravastatin, Pregabalin, Simvastatin, Tape, and Zolpidem tartrate    Review of Systems   Review of Systems  Physical Exam Updated Vital Signs BP 138/65 (BP Location: Right Arm)   Pulse 82   Temp 97.6 F (36.4 C)   Resp 18   SpO2 96%  Physical Exam  ED Results / Procedures / Treatments   Labs (all labs ordered are listed, but only abnormal results are displayed) Labs Reviewed - No data to display  EKG None  Radiology DG Hand Complete Left  Result Date: 09/15/2022 CLINICAL DATA:  Injury pain EXAM: LEFT HAND - COMPLETE 3+ VIEW COMPARISON:  None Available. FINDINGS: Acute  comminuted fracture involving the distal second metacarpal with moderate volar angulation of distal fracture fragment and mild to moderate fracture displacement. Moderate joint space narrowing and arthritis at the IP joints and first [facility] joint IMPRESSION: Acute comminuted fracture involving the distal second metacarpal with volar angulation and displacement. Electronically Signed   By: Jasmine Pang M.D.   On: 09/15/2022 18:53   CT Head Wo Contrast  Result Date: 09/15/2022 CLINICAL DATA:  Fall with left hand pain and bruising to right arm, on Xarelto EXAM: CT HEAD  WITHOUT CONTRAST TECHNIQUE: Contiguous axial images were obtained from the base of the skull through the vertex without intravenous contrast. RADIATION DOSE REDUCTION: This exam was performed according to the departmental dose-optimization program which includes automated exposure control, adjustment of the mA and/or kV according to patient size and/or use of iterative reconstruction technique. COMPARISON:  CT brain 03/13/2021 FINDINGS: Brain: No acute territorial infarction, hemorrhage or intracranial mass. Moderate severe atrophy. Moderate chronic small vessel ischemic changes of the white matter. Nonenlarged ventricles Vascular: No hyperdense vessels.  Carotid vascular calcification Skull: Normal. Negative for fracture or focal lesion. Sinuses/Orbits: No acute finding. Other: None IMPRESSION: 1. No CT evidence for acute intracranial abnormality. 2. Atrophy and chronic small vessel ischemic changes of the white matter. Electronically Signed   By: Jasmine Pang M.D.   On: 09/15/2022 18:52    Procedures Procedures  {Document cardiac monitor, telemetry assessment procedure when appropriate:1}  Medications Ordered in ED Medications - No data to display  ED Course/ Medical Decision Making/ A&P   {   Click here for ABCD2, HEART and other calculatorsREFRESH Note before signing :1}                              Medical Decision Making Amount and/or Complexity of Data Reviewed Radiology: ordered.   ***  {Document critical care time when appropriate:1} {Document review of labs and clinical decision tools ie heart score, Chads2Vasc2 etc:1}  {Document your independent review of radiology images, and any outside records:1} {Document your discussion with family members, caretakers, and with consultants:1} {Document social determinants of health affecting pt's care:1} {Document your decision making why or why not admission, treatments were needed:1} Final Clinical Impression(s) / ED Diagnoses Final  diagnoses:  None    Rx / DC Orders ED Discharge Orders     None

## 2022-09-15 NOTE — ED Notes (Signed)
Report given to the next RN... 

## 2022-09-15 NOTE — ED Triage Notes (Signed)
Fall/trip while walking outside  Unwitnessed, was evaluated by ems   Only complain is pain in left hand/finger. Bruising noted to right arm/elbow  On xarelto

## 2022-09-15 NOTE — ED Notes (Signed)
Discharge paperwork given and verbally understood.... PA visualized the splint and signed off on the splint.Marland KitchenMarland Kitchen

## 2022-09-17 DIAGNOSIS — S62331A Displaced fracture of neck of second metacarpal bone, left hand, initial encounter for closed fracture: Secondary | ICD-10-CM | POA: Diagnosis not present

## 2022-09-24 DIAGNOSIS — S62601A Fracture of unspecified phalanx of left index finger, initial encounter for closed fracture: Secondary | ICD-10-CM | POA: Diagnosis not present

## 2022-09-24 DIAGNOSIS — S62321A Displaced fracture of shaft of second metacarpal bone, left hand, initial encounter for closed fracture: Secondary | ICD-10-CM | POA: Diagnosis not present

## 2022-10-02 DIAGNOSIS — S62321A Displaced fracture of shaft of second metacarpal bone, left hand, initial encounter for closed fracture: Secondary | ICD-10-CM | POA: Diagnosis not present

## 2022-10-08 DIAGNOSIS — S62601D Fracture of unspecified phalanx of left index finger, subsequent encounter for fracture with routine healing: Secondary | ICD-10-CM | POA: Diagnosis not present

## 2022-10-08 DIAGNOSIS — S62321D Displaced fracture of shaft of second metacarpal bone, left hand, subsequent encounter for fracture with routine healing: Secondary | ICD-10-CM | POA: Diagnosis not present

## 2022-10-09 ENCOUNTER — Ambulatory Visit: Payer: PPO | Admitting: Podiatry

## 2022-10-09 ENCOUNTER — Encounter: Payer: Self-pay | Admitting: Podiatry

## 2022-10-09 DIAGNOSIS — M79675 Pain in left toe(s): Secondary | ICD-10-CM

## 2022-10-09 DIAGNOSIS — M79674 Pain in right toe(s): Secondary | ICD-10-CM

## 2022-10-09 DIAGNOSIS — B351 Tinea unguium: Secondary | ICD-10-CM | POA: Diagnosis not present

## 2022-10-09 DIAGNOSIS — D6859 Other primary thrombophilia: Secondary | ICD-10-CM

## 2022-10-09 NOTE — Progress Notes (Signed)

## 2022-10-14 ENCOUNTER — Telehealth: Payer: Self-pay

## 2022-10-14 NOTE — Telephone Encounter (Signed)
Transition Care Management Unsuccessful Follow-up Telephone Call  Date of discharge and from where:  [facility] 9/10  Attempts:  1st Attempt  Reason for unsuccessful TCM follow-up call:  No answer/busy   [name]  [facility], [facility] Guide, Phone: [phone] Website: [email or web address]

## 2022-10-15 ENCOUNTER — Telehealth: Payer: Self-pay

## 2022-10-15 NOTE — Telephone Encounter (Signed)
Transition Care Management Unsuccessful Follow-up Telephone Call  Date of discharge and from where:  [facility] 9/10  Attempts:  2nd Attempt  Reason for unsuccessful TCM follow-up call:  No answer/busy   [facility]  [facility], [facility] Guide, Phone: [phone] Website: [email or web address]

## 2022-10-19 DIAGNOSIS — N3010 Interstitial cystitis (chronic) without hematuria: Secondary | ICD-10-CM | POA: Diagnosis not present

## 2022-10-19 DIAGNOSIS — R351 Nocturia: Secondary | ICD-10-CM | POA: Diagnosis not present

## 2022-10-19 DIAGNOSIS — R102 Pelvic and perineal pain: Secondary | ICD-10-CM | POA: Diagnosis not present

## 2022-10-19 DIAGNOSIS — Z87442 Personal history of urinary calculi: Secondary | ICD-10-CM | POA: Diagnosis not present

## 2022-10-19 DIAGNOSIS — R350 Frequency of micturition: Secondary | ICD-10-CM | POA: Diagnosis not present

## 2022-10-22 DIAGNOSIS — S62601A Fracture of unspecified phalanx of left index finger, initial encounter for closed fracture: Secondary | ICD-10-CM | POA: Diagnosis not present

## 2022-10-22 DIAGNOSIS — M79642 Pain in left hand: Secondary | ICD-10-CM | POA: Diagnosis not present

## 2022-10-22 DIAGNOSIS — S62321A Displaced fracture of shaft of second metacarpal bone, left hand, initial encounter for closed fracture: Secondary | ICD-10-CM | POA: Diagnosis not present

## 2022-10-30 DIAGNOSIS — M79642 Pain in left hand: Secondary | ICD-10-CM | POA: Diagnosis not present

## 2022-11-11 DIAGNOSIS — M79642 Pain in left hand: Secondary | ICD-10-CM | POA: Diagnosis not present

## 2022-11-18 DIAGNOSIS — S62601D Fracture of unspecified phalanx of left index finger, subsequent encounter for fracture with routine healing: Secondary | ICD-10-CM | POA: Diagnosis not present

## 2022-11-18 DIAGNOSIS — S62321D Displaced fracture of shaft of second metacarpal bone, left hand, subsequent encounter for fracture with routine healing: Secondary | ICD-10-CM | POA: Diagnosis not present

## 2022-12-14 ENCOUNTER — Ambulatory Visit: Payer: PPO | Admitting: Orthopedic Surgery

## 2022-12-14 ENCOUNTER — Other Ambulatory Visit (INDEPENDENT_AMBULATORY_CARE_PROVIDER_SITE_OTHER): Payer: Self-pay

## 2022-12-14 DIAGNOSIS — M25551 Pain in right hip: Secondary | ICD-10-CM | POA: Diagnosis not present

## 2022-12-15 ENCOUNTER — Encounter: Payer: Self-pay | Admitting: Orthopedic Surgery

## 2022-12-15 NOTE — Progress Notes (Unsigned)
Office Visit Note   Patient: Christine [name]           Date of Birth: [birth year]           MRN: [id number] Visit Date: 12/14/2022 Requested by: Rodrigo Ran, MD [address] [place],  [place] [address] PCP: Rodrigo Ran, MD  Subjective: Chief Complaint  Patient presents with   Right Hip - Pain    HPI: Christine [name] is a 89 y.o. female who presents to the office reporting right hip pain.  Had relatively acute onset of the pain the day before clinic visit.  Has a history of dementia but she is able to ambulate with a walker.  No known falls.  She is walking a little bit better on the day of her clinic visit than she was the day prior.  Denies much in the way of groin pain..                ROS: All systems reviewed are negative as they relate to the chief complaint within the history of present illness.  Patient denies fevers or chills.  Assessment & Plan: Visit Diagnoses:  1. Pain in right hip     Plan: Impression is normal radiographs and exam which makes occult hip fracture unlikely.  Plan at this time is we will give this a week and if she is not continuing to improve with her gait then we can obtain MRI scan to rule out occult right hip fracture.  At this time she looks pretty good in terms of exam and her gait pattern.  Follow-Up Instructions: No follow-ups on file.   Orders:  Orders Placed This Encounter  Procedures   XR HIP UNILAT W OR W/O PELVIS 2-3 VIEWS RIGHT   No orders of the defined types were placed in this encounter.     Procedures: No procedures performed   Clinical Data: No additional findings.  Objective: Vital Signs: There were no vitals taken for this visit.  Physical Exam:  Constitutional: Patient appears well-developed HEENT:  Head: Normocephalic Eyes:EOM are normal Neck: Normal range of motion Cardiovascular: Normal rate Pulmonary/chest: Effort normal Neurologic: Patient is alert Skin: Skin is warm Psychiatric: Patient has normal mood  and affect  Ortho Exam: Ortho exam demonstrates no groin pain with internal/external rotation of either hip.  She has pretty good range of motion with internal and external rotation which is symmetric between hips.  5 out of 5 hip abduction adduction and hip flexion strength.  No nerve root tension signs.  Both feet are perfused and sensate.  Not too much trochanteric tenderness is present.  Specialty Comments:  No specialty comments available.  Imaging: XR HIP UNILAT W OR W/O PELVIS 2-3 VIEWS RIGHT  Result Date: 12/15/2022 AP pelvis lateral radiographs right hip reviewed.  No acute fracture.  Left total hip prosthesis in good position alignment.  No rami fractures or sacral fractures visible.    PMFS History: Patient Active Problem List   Diagnosis Date Noted   Syncope 01/30/2022   Syncope and collapse 01/30/2022   Malnutrition of moderate degree 03/15/2021   Vitamin B12 deficiency 03/14/2021   Dyslipidemia 03/14/2021   Recurrent falls 03/14/2021   NSTEMI (non-ST elevated myocardial infarction) (HCC) 03/13/2021   History of kidney stones 10/23/2020   Dementia without behavioral disturbance (HCC) 08/30/2020   Pain in right knee 08/30/2020   Vitamin D deficiency 08/30/2020   Bilateral impacted cerumen 08/22/2019   Thrombophilia (HCC) 03/02/2019   Increased frequency  of urination 10/14/2017   Impacted cerumen 08/04/2016   Age-related osteoporosis without current pathological fracture 07/06/2016   Hip fracture (HCC) 05/29/2016   Closed left hip fracture (HCC) 05/27/2016   Insomnia 12/17/2015   Memory loss 12/17/2015   Dysuria 12/04/2015   AKI (acute kidney injury) (HCC) 10/01/2015   Acute encephalopathy 10/01/2015   Interstitial cystitis 10/01/2015   Essential hypertension 10/01/2015   Rhabdomyolysis 09/30/2015   Major depression, single episode 07/10/2015   Non-thrombocytopenic purpura (HCC) 07/10/2015   Encounter for general adult medical examination without abnormal  findings 11/30/2014   Fall 08/07/2013   Pain in left leg 08/07/2013   Lower urinary tract infectious disease 07/16/2013   Paroxysmal atrial fibrillation (HCC) 07/16/2013   Chest pain 07/16/2013   Transient ischemic attack 07/04/2012   Chronic pain syndrome 03/28/2012   Residual hemorrhoidal skin tags 01/18/2012   Skin sensation disturbance 03/03/2010   Abdominal pain 02/03/2010   Pain in limb 10/07/2009   Anemia 03/26/2009   Allergic rhinitis 02/27/2009   Anxiety disorder 02/27/2009   Hyperlipidemia 02/27/2009   Low compliance bladder 02/27/2009   DYSPEPSIA 01/18/2008   ADENOMATOUS COLONIC POLYP 01/17/2008   HEMORRHOIDS, INTERNAL 01/17/2008   ESOPHAGITIS 01/17/2008   DIVERTICULOSIS, COLON 01/17/2008   HEMORRHOIDS-EXTERNAL 12/07/2007   GERD 12/07/2007   Constipation 12/07/2007   RECTAL BLEEDING 12/07/2007   Loss of weight 12/07/2007   Nausea alone 12/07/2007   HEARTBURN 12/07/2007   ABDOMINAL BLOATING 12/07/2007   History of colonic polyps 12/07/2007   Past Medical History:  Diagnosis Date   AR (allergic rhinitis)    Arthritis    Bilateral carotid artery stenosis    MILD --  40% BILATERAL ICA PER DOPPLER JULY 2014   Diverticulosis    Frequency of urination    GERD (gastroesophageal reflux disease)    History of colon polyps    ADENOMATOUS   History of Helicobacter pylori infection    History of shingles    History of TIA (transient ischemic attack)    Hyperlipidemia    Hypertension    Impaired memory    Interstitial cystitis    Iron deficiency    Nocturia    Osteopenia    PAF (paroxysmal atrial fibrillation) (HCC)    dx 07/ 2015   Pelvic pain in female    Urgency of urination    Vitamin D deficiency    Wears glasses     History reviewed. No pertinent family history.  Past Surgical History:  Procedure Laterality Date   ABDOMINAL HYSTERECTOMY     CATARACT EXTRACTION W/ INTRAOCULAR LENS  IMPLANT, BILATERAL  2009   CYSTO WITH HYDRODISTENSION  02/24/2011    Procedure: CYSTOSCOPY/HYDRODISTENSION;  Surgeon: Martina Sinner, MD;  Location: [facility];  Service: Urology;  Laterality: N/A;  INSTILLATION OF marcaine and  pyridium   CYSTO WITH HYDRODISTENSION N/A 06/20/2013   Procedure: CYSTOSCOPY/HYDRODISTENSION/INSTALLATION OF PYRIDIUM -MARCAINE 0.5%.;  Surgeon: Martina Sinner, MD;  Location: [facility];  Service: Urology;  Laterality: N/A;   CYSTO WITH HYDRODISTENSION N/A 03/22/2014   Procedure: CYSTOSCOPY/HYDRODISTENSION INSTILLATION OF MARCAINE AND PYRDIUM ;  Surgeon: Alfredo Martinez, MD;  Location: [facility];  Service: Urology;  Laterality: N/A;   CYSTO WITH HYDRODISTENSION N/A 09/17/2014   Procedure: CYSTOSCOPY HYDRODISTENSION INSTILLATION OF MARCAINE AND PYRIDIUM;  Surgeon: Alfredo Martinez, MD;  Location: [facility];  Service: Urology;  Laterality: N/A;   CYSTO/ HOD/ INSTILLATION THERAPY  10-04-2008  &  05-02-2009   CYSTO/ LEFT  URETEROSCOPY W/ LEFT URETERAL DILATION  04-15-2006   TOTAL HIP ARTHROPLASTY Left 05/29/2016   Procedure: TOTAL HIP ARTHROPLASTY ANTERIOR APPROACH;  Surgeon: Cammy Copa, MD;  Location[facility];  Service: Orthopedics;  Laterality: Left;   TRANSTHORACIC ECHOCARDIOGRAM  07-17-2013   grade I diastolic dysfuntion/  ef 60-65%/  mild AR and MR/  trivial TR   URETEROLITHOTOMY Bilateral 11-24-2004 LEFT  &  05-29-2006  RIGHT   VAULT SUSPENSION WITH GRAFT/ CYSTOCELE REPAIR  09-17-2009   Social History   Occupational History   Not on file  Tobacco Use   Smoking status: Never   Smokeless tobacco: Never  Substance and Sexual Activity   Alcohol use: No   Drug use: No   Sexual activity: Not on file

## 2022-12-17 DIAGNOSIS — H6123 Impacted cerumen, bilateral: Secondary | ICD-10-CM | POA: Diagnosis not present

## 2023-02-10 DIAGNOSIS — L820 Inflamed seborrheic keratosis: Secondary | ICD-10-CM | POA: Diagnosis not present

## 2023-02-10 DIAGNOSIS — L821 Other seborrheic keratosis: Secondary | ICD-10-CM | POA: Diagnosis not present

## 2023-02-10 DIAGNOSIS — Z85828 Personal history of other malignant neoplasm of skin: Secondary | ICD-10-CM | POA: Diagnosis not present

## 2023-02-17 ENCOUNTER — Ambulatory Visit: Payer: PPO | Admitting: Podiatry

## 2023-02-17 ENCOUNTER — Encounter: Payer: Self-pay | Admitting: Podiatry

## 2023-02-17 DIAGNOSIS — B351 Tinea unguium: Secondary | ICD-10-CM | POA: Diagnosis not present

## 2023-02-17 DIAGNOSIS — M79674 Pain in right toe(s): Secondary | ICD-10-CM

## 2023-02-17 DIAGNOSIS — D6859 Other primary thrombophilia: Secondary | ICD-10-CM | POA: Diagnosis not present

## 2023-02-17 DIAGNOSIS — M79675 Pain in left toe(s): Secondary | ICD-10-CM

## 2023-02-25 NOTE — Progress Notes (Signed)
 Subjective:  Patient ID: Christine [name], female    DOB: [birth year],  MRN: [id number]  89 y.o. female presents painful, elongated thickened toenails x 10 which are symptomatic when wearing enclosed shoe gear. This interferes with his/her daily activities. Chief Complaint  Patient presents with   RFC    She is here for a nail trim, PCP is dr. Waynard Edwards and seen a few months ago.    New problem(s): None   PCP is Rodrigo Ran, MD.  Allergies  Allergen Reactions   Atorvastatin Other (See Comments)    hot flashes; felt like walking/leaning to L side; constipation   Citalopram Other (See Comments)    felt it made the inside and outside of her skin burn.it is not clear if this is an allergy or just an adverse reaction of some kind.   Duloxetine Hcl Other (See Comments)     she can't tolerate it and wanted it listed as an allergy   Ezetimibe Other (See Comments)    "had problems with it"   Fluvastatin Other (See Comments)     made legs ache.   Gabapentin Other (See Comments)    hallucinations   Oxybutynin Chloride Other (See Comments)    throat swelling; burning eyelids; nausea   Pravastatin Other (See Comments)    "had problems with it"   Pregabalin Other (See Comments)    made her feel funny   Simvastatin Other (See Comments)     leg pain   Tape Other (See Comments)    Bruising    Zolpidem Tartrate Other (See Comments)     hallucinations.    Review of Systems: Negative except as noted in the HPI.   Objective:  Christine [name] is a pleasant 89 y.o. female WD, WN in NAD. AAO x 3.  Vascular Examination: Palpable pedal pulses b/l LE. Digital hair present b/l. No pedal edema b/l. Skin temperature gradient WNL b/l. No varicosities b/l. Marland Kitchen  Dermatological Examination: Pedal skin with normal turgor, texture and tone b/l. No open wounds. No interdigital macerations b/l. Toenails 1-5 b/l thickened, discolored, dystrophic with subungual debris. There is pain on palpation to dorsal  aspect of nailplates. No hyperkeratotic nor porokeratotic lesions present on today's visit.Marland Kitchen  Neurological Examination: Protective sensation intact with 10 gram monofilament b/l LE. Vibratory sensation intact b/l LE.   Musculoskeletal Examination: Muscle strength 5/5 to all LE muscle groups b/l. HAV with bunion deformity noted b/l LE.  Radiographs: None  Last A1c:       No data to display           Assessment:   1. Pain due to onychomycosis of toenails of both feet   2. Thrombophilia (HCC)    Plan:  Patient was evaluated and treated. All patient's and/or POA's questions/concerns addressed on today's visit. Mycotic toenails 1-5 debrided in length and girth without incident. Continue soft, supportive shoe gear daily. Report any pedal injuries to medical professional. Call office if there are any quesitons/concerns. -Patient/POA to call should there be question/concern in the interim.  Return in about 3 months (around 05/17/2023).  Freddie Breech, DPM      [place] LOCATION: [address], [place] [address]  Office [phone]   [facility] LOCATION: [address] [place], [place] [address] Office [phone]

## 2023-03-18 DIAGNOSIS — D6869 Other thrombophilia: Secondary | ICD-10-CM | POA: Diagnosis not present

## 2023-03-18 DIAGNOSIS — M810 Age-related osteoporosis without current pathological fracture: Secondary | ICD-10-CM | POA: Diagnosis not present

## 2023-03-18 DIAGNOSIS — N318 Other neuromuscular dysfunction of bladder: Secondary | ICD-10-CM | POA: Diagnosis not present

## 2023-03-18 DIAGNOSIS — N3010 Interstitial cystitis (chronic) without hematuria: Secondary | ICD-10-CM | POA: Diagnosis not present

## 2023-03-18 DIAGNOSIS — N1831 Chronic kidney disease, stage 3a: Secondary | ICD-10-CM | POA: Diagnosis not present

## 2023-03-18 DIAGNOSIS — I480 Paroxysmal atrial fibrillation: Secondary | ICD-10-CM | POA: Diagnosis not present

## 2023-03-18 DIAGNOSIS — F0390 Unspecified dementia without behavioral disturbance: Secondary | ICD-10-CM | POA: Diagnosis not present

## 2023-03-18 DIAGNOSIS — E785 Hyperlipidemia, unspecified: Secondary | ICD-10-CM | POA: Diagnosis not present

## 2023-03-18 DIAGNOSIS — I130 Hypertensive heart and chronic kidney disease with heart failure and stage 1 through stage 4 chronic kidney disease, or unspecified chronic kidney disease: Secondary | ICD-10-CM | POA: Diagnosis not present

## 2023-03-18 DIAGNOSIS — I5022 Chronic systolic (congestive) heart failure: Secondary | ICD-10-CM | POA: Diagnosis not present

## 2023-03-18 DIAGNOSIS — Z8673 Personal history of transient ischemic attack (TIA), and cerebral infarction without residual deficits: Secondary | ICD-10-CM | POA: Diagnosis not present

## 2023-03-18 DIAGNOSIS — D692 Other nonthrombocytopenic purpura: Secondary | ICD-10-CM | POA: Diagnosis not present

## 2023-03-30 ENCOUNTER — Other Ambulatory Visit (HOSPITAL_COMMUNITY): Payer: Self-pay

## 2023-04-01 ENCOUNTER — Ambulatory Visit (HOSPITAL_COMMUNITY)
Admission: RE | Admit: 2023-04-01 | Discharge: 2023-04-01 | Disposition: A | Discharge status: Home / Self Care | Source: Ambulatory Visit | Attending: Internal Medicine | Admitting: Internal Medicine

## 2023-04-01 DIAGNOSIS — M810 Age-related osteoporosis without current pathological fracture: Secondary | ICD-10-CM | POA: Insufficient documentation

## 2023-04-01 MED ORDER — DENOSUMAB 60 MG/ML ~~LOC~~ SOSY
60.0000 mg | PREFILLED_SYRINGE | Freq: Once | SUBCUTANEOUS | Status: AC
  Administered 2023-04-01: 60 mg via SUBCUTANEOUS

## 2023-04-01 MED ORDER — DENOSUMAB 60 MG/ML ~~LOC~~ SOSY
PREFILLED_SYRINGE | SUBCUTANEOUS | Status: AC
  Filled 2023-04-01: qty 1

## 2023-06-22 ENCOUNTER — Encounter: Payer: Self-pay | Admitting: Podiatry

## 2023-06-22 ENCOUNTER — Ambulatory Visit: Payer: PPO | Admitting: Podiatry

## 2023-06-22 VITALS — Ht 64.0 in | Wt 124.3 lb

## 2023-06-22 DIAGNOSIS — D689 Coagulation defect, unspecified: Secondary | ICD-10-CM

## 2023-06-22 DIAGNOSIS — M79675 Pain in left toe(s): Secondary | ICD-10-CM

## 2023-06-22 DIAGNOSIS — M79674 Pain in right toe(s): Secondary | ICD-10-CM | POA: Diagnosis not present

## 2023-06-22 DIAGNOSIS — B351 Tinea unguium: Secondary | ICD-10-CM

## 2023-06-27 NOTE — Progress Notes (Signed)
 Subjective:  Patient ID: Christine [name], female    DOB: [birth year],  MRN: [id number]  DHRITI FALES presents to clinic today for at risk foot care with h/o clotting disorder and painful mycotic toenails of both feet that are difficult to trim. Pain interferes with daily activities and wearing enclosed shoe gear comfortably. She is accompanied by her caregiver on today's visit. Chief Complaint  Patient presents with   Nail Problem    Pt is here for [facility] PCP is Dr Shayne and LOV was in March.   New problem(s): None.   PCP is Shayne Anes, MD.  Allergies  Allergen Reactions   Atorvastatin Other (See Comments)    hot flashes; felt like walking/leaning to L side; constipation   Citalopram Other (See Comments)    felt it made the inside and outside of her skin burn.it is not clear if this is an allergy or just an adverse reaction of some kind.   Duloxetine Hcl Other (See Comments)     she can't tolerate it and wanted it listed as an allergy   Ezetimibe Other (See Comments)    had problems with it   Fluvastatin Other (See Comments)     made legs ache.   Gabapentin Other (See Comments)    hallucinations   Oxybutynin Chloride Other (See Comments)    throat swelling; burning eyelids; nausea   Pravastatin Other (See Comments)    had problems with it   Pregabalin  Other (See Comments)    made her feel funny   Simvastatin Other (See Comments)     leg pain   Tape Other (See Comments)    Bruising    Zolpidem      Other Reaction(s): hallucinations.   Zolpidem  Tartrate Other (See Comments)     hallucinations.    Review of Systems: Negative except as noted in the HPI.  Objective: No changes noted in today's physical examination. There were no vitals filed for this visit. Christine [name] is a pleasant 89 y.o. female WD, WN in NAD. AAO x 3.  Vascular Examination: Capillary refill time immediate b/l. Palpable pedal pulses. Pedal hair present b/l. No pain with calf compression b/l.  Skin temperature gradient WNL b/l. No cyanosis or clubbing b/l. No ischemia or gangrene noted b/l. No varicosities noted.  Neurological Examination: Sensation grossly intact b/l with 10 gram monofilament.  Dermatological Examination: Pedal skin with normal turgor, texture and tone b/l.  No open wounds. No interdigital macerations.   Toenails 1-5 b/l thick, discolored, elongated with subungual debris and pain on dorsal palpation.   No corns, calluses nor porokeratotic lesions noted.  Musculoskeletal Examination: Muscle strength 5/5 to all lower extremity muscle groups bilaterally. HAV with bunion deformity noted b/l LE.  Radiographs: None  Assessment/Plan: 1. Pain due to onychomycosis of toenails of both feet   2. Clotting disorder [facility])   Consent given for treatment. Patient examined. All patient's and/or POA's questions/concerns addressed on today's visit.Toenails 1-5 debrided in length and girth without incident. Continue soft, supportive shoe gear daily. Report any pedal injuries to medical professional. Call office if there are any questions/concerns. -Patient/POA to call should there be question/concern in the interim.   Return in about 3 months (around 09/22/2023).  Delon LITTIE Merlin, DPM      [place] LOCATION: [address]  [place], [place] [address]                   Office [phone]   [facility] LOCATION: [address] [place], [place] [address] Office [phone]

## 2023-07-28 DIAGNOSIS — L812 Freckles: Secondary | ICD-10-CM | POA: Diagnosis not present

## 2023-07-28 DIAGNOSIS — L820 Inflamed seborrheic keratosis: Secondary | ICD-10-CM | POA: Diagnosis not present

## 2023-07-28 DIAGNOSIS — D1801 Hemangioma of skin and subcutaneous tissue: Secondary | ICD-10-CM | POA: Diagnosis not present

## 2023-07-28 DIAGNOSIS — L821 Other seborrheic keratosis: Secondary | ICD-10-CM | POA: Diagnosis not present

## 2023-07-28 DIAGNOSIS — L570 Actinic keratosis: Secondary | ICD-10-CM | POA: Diagnosis not present

## 2023-07-28 DIAGNOSIS — Z85828 Personal history of other malignant neoplasm of skin: Secondary | ICD-10-CM | POA: Diagnosis not present

## 2023-07-28 DIAGNOSIS — D225 Melanocytic nevi of trunk: Secondary | ICD-10-CM | POA: Diagnosis not present

## 2023-08-16 DIAGNOSIS — H6123 Impacted cerumen, bilateral: Secondary | ICD-10-CM | POA: Diagnosis not present

## 2023-08-16 DIAGNOSIS — H9311 Tinnitus, right ear: Secondary | ICD-10-CM | POA: Diagnosis not present

## 2023-08-17 DIAGNOSIS — H9190 Unspecified hearing loss, unspecified ear: Secondary | ICD-10-CM | POA: Diagnosis not present

## 2023-08-17 DIAGNOSIS — H9311 Tinnitus, right ear: Secondary | ICD-10-CM | POA: Diagnosis not present

## 2023-09-02 DIAGNOSIS — I5022 Chronic systolic (congestive) heart failure: Secondary | ICD-10-CM | POA: Diagnosis not present

## 2023-09-02 DIAGNOSIS — N1831 Chronic kidney disease, stage 3a: Secondary | ICD-10-CM | POA: Diagnosis not present

## 2023-09-02 DIAGNOSIS — Z0189 Encounter for other specified special examinations: Secondary | ICD-10-CM | POA: Diagnosis not present

## 2023-09-02 DIAGNOSIS — M810 Age-related osteoporosis without current pathological fracture: Secondary | ICD-10-CM | POA: Diagnosis not present

## 2023-09-02 DIAGNOSIS — E785 Hyperlipidemia, unspecified: Secondary | ICD-10-CM | POA: Diagnosis not present

## 2023-09-02 DIAGNOSIS — I130 Hypertensive heart and chronic kidney disease with heart failure and stage 1 through stage 4 chronic kidney disease, or unspecified chronic kidney disease: Secondary | ICD-10-CM | POA: Diagnosis not present

## 2023-09-02 DIAGNOSIS — D649 Anemia, unspecified: Secondary | ICD-10-CM | POA: Diagnosis not present

## 2023-09-07 DIAGNOSIS — I480 Paroxysmal atrial fibrillation: Secondary | ICD-10-CM | POA: Diagnosis not present

## 2023-09-07 DIAGNOSIS — F419 Anxiety disorder, unspecified: Secondary | ICD-10-CM | POA: Diagnosis not present

## 2023-09-07 DIAGNOSIS — E785 Hyperlipidemia, unspecified: Secondary | ICD-10-CM | POA: Diagnosis not present

## 2023-09-07 DIAGNOSIS — F0390 Unspecified dementia without behavioral disturbance: Secondary | ICD-10-CM | POA: Diagnosis not present

## 2023-09-07 DIAGNOSIS — D649 Anemia, unspecified: Secondary | ICD-10-CM | POA: Diagnosis not present

## 2023-09-07 DIAGNOSIS — G894 Chronic pain syndrome: Secondary | ICD-10-CM | POA: Diagnosis not present

## 2023-09-07 DIAGNOSIS — Z0000 Encounter for general adult medical examination without abnormal findings: Secondary | ICD-10-CM | POA: Diagnosis not present

## 2023-09-07 DIAGNOSIS — N1831 Chronic kidney disease, stage 3a: Secondary | ICD-10-CM | POA: Diagnosis not present

## 2023-09-07 DIAGNOSIS — D61818 Other pancytopenia: Secondary | ICD-10-CM | POA: Diagnosis not present

## 2023-09-07 DIAGNOSIS — I130 Hypertensive heart and chronic kidney disease with heart failure and stage 1 through stage 4 chronic kidney disease, or unspecified chronic kidney disease: Secondary | ICD-10-CM | POA: Diagnosis not present

## 2023-09-07 DIAGNOSIS — I5022 Chronic systolic (congestive) heart failure: Secondary | ICD-10-CM | POA: Diagnosis not present

## 2023-09-07 DIAGNOSIS — R82998 Other abnormal findings in urine: Secondary | ICD-10-CM | POA: Diagnosis not present

## 2023-09-07 DIAGNOSIS — F329 Major depressive disorder, single episode, unspecified: Secondary | ICD-10-CM | POA: Diagnosis not present

## 2023-09-08 ENCOUNTER — Telehealth (HOSPITAL_COMMUNITY): Payer: Self-pay

## 2023-09-08 ENCOUNTER — Other Ambulatory Visit (HOSPITAL_COMMUNITY): Payer: Self-pay | Admitting: Internal Medicine

## 2023-09-08 DIAGNOSIS — M810 Age-related osteoporosis without current pathological fracture: Secondary | ICD-10-CM

## 2023-09-08 NOTE — Telephone Encounter (Signed)
Auth Submission: NO AUTH NEEDED Site of care: Site of care: [facility] Payer: [employer] Medication & CPT/J Code(s) submitted: Prolia  (Denosumab ) [id number] Diagnosis Code: M81.0 Route of submission (phone, fax, portal):  Phone # Fax # Auth type: Buy/Bill HB Units/visits requested: 60mg  q 6 months x 2 doses Reference number:  Approval from: 09/08/23 to 12/20/2023

## 2023-09-14 ENCOUNTER — Other Ambulatory Visit (HOSPITAL_COMMUNITY): Payer: Self-pay | Admitting: Internal Medicine

## 2023-09-16 ENCOUNTER — Emergency Department (HOSPITAL_BASED_OUTPATIENT_CLINIC_OR_DEPARTMENT_OTHER): Admission: EM | Admit: 2023-09-16 | Discharge: 2023-09-16 | Disposition: A | Discharge status: Home / Self Care

## 2023-09-16 ENCOUNTER — Other Ambulatory Visit: Payer: Self-pay

## 2023-09-16 ENCOUNTER — Encounter (HOSPITAL_BASED_OUTPATIENT_CLINIC_OR_DEPARTMENT_OTHER): Payer: Self-pay | Admitting: Emergency Medicine

## 2023-09-16 DIAGNOSIS — W228XXA Striking against or struck by other objects, initial encounter: Secondary | ICD-10-CM | POA: Diagnosis not present

## 2023-09-16 DIAGNOSIS — S51812A Laceration without foreign body of left forearm, initial encounter: Secondary | ICD-10-CM | POA: Insufficient documentation

## 2023-09-16 DIAGNOSIS — S59912A Unspecified injury of left forearm, initial encounter: Secondary | ICD-10-CM | POA: Diagnosis present

## 2023-09-16 NOTE — ED Provider Notes (Signed)
 [place] EMERGENCY DEPARTMENT AT [facility] Provider Note   CSN: [id number] Arrival date & time: 09/16/23  1053     Patient presents with: Wound Check   Christine [name] is a 89 y.o. female.   This is an 89 year old female presenting emergency department with wound to her left forearm.  Daughter reports bumping her arm onto a wall on Monday and suffered a skin tear.  Has been putting Neosporin on wound, but does not appear that is healing.  No fevers no chills.  Acting normal self.  No injuries did not fall did not hit her head   Wound Check       Prior to Admission medications   Medication Sig Start Date End Date Taking? Authorizing Provider  divalproex (DEPAKOTE SPRINKLE) 125 MG capsule Take by mouth. 07/15/23  Yes [provider]  QUEtiapine (SEROQUEL) 25 MG tablet Take 25 mg by mouth every evening. 09/07/23  Yes [provider]  REPATHA SURECLICK 140 MG/ML SOAJ Inject 140 mg into the skin every 14 (fourteen) days. 06/18/23  Yes [provider]  acetaminophen  (TYLENOL ) 500 MG tablet Take 1,000 mg by mouth as needed for moderate pain.    [provider]  Cholecalciferol  (VITAMIN D3) 1000 UNITS CAPS Take 1,000 Units by mouth daily.     [provider]  denosumab  (PROLIA ) 60 MG/ML SOSY injection Inject 60 mg into the skin every 6 (six) months.    [provider]  donepezil  (ARICEPT ) 10 MG tablet Take 10 mg by mouth at bedtime. 08/29/20   [provider]  guaiFENesin -dextromethorphan  (ROBITUSSIN DM) 100-10 MG/5ML syrup Take 5 mLs by mouth every 4 (four) hours as needed for cough. 01/31/22   Christine Coria, MD  hydrOXYzine  (ATARAX /VISTARIL ) 25 MG tablet Take 50 mg by mouth at bedtime.  08/27/15   [provider]  LORazepam  (ATIVAN ) 1 MG tablet Take 1 mg by mouth at bedtime. 12/23/17   [provider]  losartan  (COZAAR ) 50 MG tablet Take 50 mg by mouth daily.    [provider]  memantine   (NAMENDA ) 5 MG tablet Take 5 mg by mouth daily.    [provider]  metoprolol  tartrate (LOPRESSOR ) 25 MG tablet Take 0.5 tablets (12.5 mg total) by mouth 2 (two) times daily. Patient not taking: Reported on 06/22/2023 03/15/21   Christine Elsie BROCKS, MD  mirabegron  ER (MYRBETRIQ ) 50 MG TB24 tablet Take 50 mg by mouth daily. 12/25/14   [provider]  pantoprazole  (PROTONIX ) 40 MG tablet Take 40 mg by mouth daily.    [provider]  pentosan polysulfate (ELMIRON ) 100 MG capsule Take 100 mg by mouth daily. 08/27/15   [provider]  Polyethylene Glycol 3350  (MIRALAX  PO) Take 17 g by mouth daily as needed (constipation). 10/07/09   [provider]  PRALUENT 150 MG/ML SOAJ Inject 150 mg into the skin every 21 ( twenty-one) days. 02/11/18   [provider]  Rivaroxaban  (XARELTO ) 15 MG TABS tablet Take 1 tablet (15 mg total) by mouth daily with supper. Patient taking differently: Take 15 mg by mouth daily. 07/17/13   Christine Algis SAILOR, MD  traMADol  (ULTRAM ) 50 MG tablet Take 50 mg by mouth as needed for moderate pain. 01/31/18   [provider]  vitamin B-12 (CYANOCOBALAMIN ) 1000 MCG tablet Take 1,000 mcg by mouth daily.    [provider]    Allergies: Atorvastatin, Citalopram, Duloxetine hcl, Ezetimibe, Fluvastatin, Gabapentin, Oxybutynin chloride, Pravastatin, Pregabalin , Simvastatin, Tape, Zolpidem , and Zolpidem   tartrate    Review of Systems  Updated Vital Signs BP (!) 156/69 (BP Location: Right Arm)   Pulse 83   Temp 98.6 F (37 C)   Resp 16   SpO2 99%   Physical Exam Vitals and nursing note reviewed.  Constitutional:      General: She is not in acute distress. HENT:     Head: Normocephalic.     Nose: Nose normal.     Mouth/Throat:     Mouth: Mucous membranes are moist.  Eyes:     Conjunctiva/sclera: Conjunctivae normal.  Cardiovascular:     Rate and Rhythm: Normal rate.  Pulmonary:     Effort: Pulmonary effort  is normal.  Abdominal:     General: Abdomen is flat. There is no distension.     Palpations: Abdomen is soft.     Tenderness: There is no abdominal tenderness. There is no guarding or rebound.  Musculoskeletal:        General: Normal range of motion.  Skin:    General: Skin is warm and dry.     Capillary Refill: Capillary refill takes less than 2 seconds.     Comments: Patient with a 5 cm skin tear to her left forearm.  There was some oozing from the capillary bed the epidermis appears to have rolled under the lateral aspect of the wound no deep laceration.  No foreign bodies.  No surrounding erythema induration or fluctuance.  No purulent drainage.  No crepitus.  Neurological:     Mental Status: She is alert. Mental status is at baseline.  Psychiatric:        Mood and Affect: Mood normal.        Behavior: Behavior normal.     (all labs ordered are listed, but only abnormal results are displayed) Labs Reviewed - No data to display  EKG: None  Radiology: No results found.   Procedures   Medications Ordered in the ED - No data to display                                  Medical Decision Making This is an 89 year old female presenting emergency department with a skin tear to her left forearm requesting wound evaluation.  She is afebrile nontachycardic, slightly hypertensive.  Wound does not appear to have infection.  I personally irrigated wound with tap water  and used forceps to unroll her epidermis that had bunched on the side of the wound.  Patient tolerated procedure well.  Dressed with nonstick and Coban.  Discussed wound care and follow-up with PCP.  Amount and/or Complexity of Data Reviewed Independent Historian:     Details: Daughter notes that she has been putting Neosporin and changing bandage daily Labs:     Details: Low suspicion for acute blood loss anemia.  Low suspicion for systemic infection Radiology:     Details: Considered x-ray, however no underlying bony  tenderness.       Final diagnoses:  None    ED Discharge Orders     None          Christine Caron PARAS, DO 09/16/23 1154

## 2023-09-16 NOTE — ED Triage Notes (Signed)
 Daughter states skin tear to left forearm on Monday. Bleeding controlled.

## 2023-09-16 NOTE — Discharge Instructions (Signed)
 Please keep wound clean and dry.  Change bandages daily.  Follow-up with your primary doctor.  Return if develop fevers, chills, redness around wound, begins draining pus, foul odor, severe pain or any new or worsening symptoms that are concerning to you.

## 2023-09-17 DIAGNOSIS — Z111 Encounter for screening for respiratory tuberculosis: Secondary | ICD-10-CM | POA: Diagnosis not present

## 2023-10-04 ENCOUNTER — Inpatient Hospital Stay (HOSPITAL_COMMUNITY): Admission: RE | Admit: 2023-10-04 | Source: Ambulatory Visit

## 2023-10-08 DIAGNOSIS — F0390 Unspecified dementia without behavioral disturbance: Secondary | ICD-10-CM | POA: Diagnosis not present

## 2023-10-08 DIAGNOSIS — N3281 Overactive bladder: Secondary | ICD-10-CM | POA: Diagnosis not present

## 2023-10-08 DIAGNOSIS — I10 Essential (primary) hypertension: Secondary | ICD-10-CM | POA: Diagnosis not present

## 2023-10-12 ENCOUNTER — Ambulatory Visit: Admitting: Podiatry

## 2023-10-12 DIAGNOSIS — Z131 Encounter for screening for diabetes mellitus: Secondary | ICD-10-CM | POA: Diagnosis not present

## 2023-10-12 DIAGNOSIS — Z1329 Encounter for screening for other suspected endocrine disorder: Secondary | ICD-10-CM | POA: Diagnosis not present

## 2023-10-22 DIAGNOSIS — F329 Major depressive disorder, single episode, unspecified: Secondary | ICD-10-CM | POA: Diagnosis not present

## 2023-10-22 DIAGNOSIS — I480 Paroxysmal atrial fibrillation: Secondary | ICD-10-CM | POA: Diagnosis not present

## 2023-10-22 DIAGNOSIS — D6869 Other thrombophilia: Secondary | ICD-10-CM | POA: Diagnosis not present

## 2023-10-22 DIAGNOSIS — Z8673 Personal history of transient ischemic attack (TIA), and cerebral infarction without residual deficits: Secondary | ICD-10-CM | POA: Diagnosis not present

## 2023-10-22 DIAGNOSIS — F0390 Unspecified dementia without behavioral disturbance: Secondary | ICD-10-CM | POA: Diagnosis not present

## 2023-10-22 DIAGNOSIS — D692 Other nonthrombocytopenic purpura: Secondary | ICD-10-CM | POA: Diagnosis not present

## 2023-10-22 DIAGNOSIS — N1831 Chronic kidney disease, stage 3a: Secondary | ICD-10-CM | POA: Diagnosis not present

## 2023-10-22 DIAGNOSIS — M810 Age-related osteoporosis without current pathological fracture: Secondary | ICD-10-CM | POA: Diagnosis not present

## 2023-10-22 DIAGNOSIS — I5022 Chronic systolic (congestive) heart failure: Secondary | ICD-10-CM | POA: Diagnosis not present

## 2023-10-22 DIAGNOSIS — I130 Hypertensive heart and chronic kidney disease with heart failure and stage 1 through stage 4 chronic kidney disease, or unspecified chronic kidney disease: Secondary | ICD-10-CM | POA: Diagnosis not present

## 2023-10-22 DIAGNOSIS — D61818 Other pancytopenia: Secondary | ICD-10-CM | POA: Diagnosis not present

## 2023-11-06 ENCOUNTER — Emergency Department (HOSPITAL_COMMUNITY)
Admission: EM | Admit: 2023-11-06 | Discharge: 2023-11-06 | Disposition: A | Discharge status: Home / Self Care | Source: Home / Self Care | Attending: Emergency Medicine | Admitting: Emergency Medicine

## 2023-11-06 ENCOUNTER — Emergency Department (HOSPITAL_COMMUNITY)

## 2023-11-06 DIAGNOSIS — N3000 Acute cystitis without hematuria: Secondary | ICD-10-CM | POA: Diagnosis not present

## 2023-11-06 DIAGNOSIS — Z7901 Long term (current) use of anticoagulants: Secondary | ICD-10-CM | POA: Insufficient documentation

## 2023-11-06 DIAGNOSIS — R911 Solitary pulmonary nodule: Secondary | ICD-10-CM | POA: Diagnosis not present

## 2023-11-06 DIAGNOSIS — M47812 Spondylosis without myelopathy or radiculopathy, cervical region: Secondary | ICD-10-CM | POA: Diagnosis not present

## 2023-11-06 DIAGNOSIS — R000 Tachycardia, unspecified: Secondary | ICD-10-CM | POA: Diagnosis not present

## 2023-11-06 DIAGNOSIS — N200 Calculus of kidney: Secondary | ICD-10-CM | POA: Diagnosis not present

## 2023-11-06 DIAGNOSIS — M545 Low back pain, unspecified: Secondary | ICD-10-CM | POA: Insufficient documentation

## 2023-11-06 DIAGNOSIS — Z8673 Personal history of transient ischemic attack (TIA), and cerebral infarction without residual deficits: Secondary | ICD-10-CM | POA: Insufficient documentation

## 2023-11-06 DIAGNOSIS — I480 Paroxysmal atrial fibrillation: Secondary | ICD-10-CM | POA: Insufficient documentation

## 2023-11-06 DIAGNOSIS — M25552 Pain in left hip: Secondary | ICD-10-CM | POA: Diagnosis not present

## 2023-11-06 DIAGNOSIS — K7689 Other specified diseases of liver: Secondary | ICD-10-CM | POA: Diagnosis not present

## 2023-11-06 DIAGNOSIS — K5730 Diverticulosis of large intestine without perforation or abscess without bleeding: Secondary | ICD-10-CM | POA: Diagnosis not present

## 2023-11-06 DIAGNOSIS — Z043 Encounter for examination and observation following other accident: Secondary | ICD-10-CM | POA: Diagnosis not present

## 2023-11-06 DIAGNOSIS — I10 Essential (primary) hypertension: Secondary | ICD-10-CM | POA: Insufficient documentation

## 2023-11-06 DIAGNOSIS — M549 Dorsalgia, unspecified: Secondary | ICD-10-CM | POA: Diagnosis not present

## 2023-11-06 LAB — CBC WITH DIFFERENTIAL/PLATELET
Abs Immature Granulocytes: 0.02 K/uL (ref 0.00–0.07)
Basophils Absolute: 0 K/uL (ref 0.0–0.1)
Basophils Relative: 0 %
Eosinophils Absolute: 0 K/uL (ref 0.0–0.5)
Eosinophils Relative: 0 %
HCT: 35.5 % — ABNORMAL LOW (ref 36.0–46.0)
Hemoglobin: 11.1 g/dL — ABNORMAL LOW (ref 12.0–15.0)
Immature Granulocytes: 0 %
Lymphocytes Relative: 17 %
Lymphs Abs: 1.1 K/uL (ref 0.7–4.0)
MCH: 28.6 pg (ref 26.0–34.0)
MCHC: 31.3 g/dL (ref 30.0–36.0)
MCV: 91.5 fL (ref 80.0–100.0)
Monocytes Absolute: 1.3 K/uL — ABNORMAL HIGH (ref 0.1–1.0)
Monocytes Relative: 20 %
Neutro Abs: 4 K/uL (ref 1.7–7.7)
Neutrophils Relative %: 63 %
Platelets: 135 K/uL — ABNORMAL LOW (ref 150–400)
RBC: 3.88 MIL/uL (ref 3.87–5.11)
RDW: 15.7 % — ABNORMAL HIGH (ref 11.5–15.5)
WBC: 6.4 K/uL (ref 4.0–10.5)
nRBC: 0 % (ref 0.0–0.2)

## 2023-11-06 LAB — BASIC METABOLIC PANEL WITH GFR
Anion gap: 12 (ref 5–15)
BUN: 22 mg/dL (ref 8–23)
CO2: 24 mmol/L (ref 22–32)
Calcium: 9.5 mg/dL (ref 8.9–10.3)
Chloride: 104 mmol/L (ref 98–111)
Creatinine, Ser: 1.01 mg/dL — ABNORMAL HIGH (ref 0.44–1.00)
GFR, Estimated: 53 mL/min — ABNORMAL LOW (ref >60)
Glucose, Bld: 108 mg/dL — ABNORMAL HIGH (ref 70–99)
Potassium: 4.3 mmol/L (ref 3.5–5.1)
Sodium: 140 mmol/L (ref 135–145)

## 2023-11-06 LAB — URINALYSIS, ROUTINE W REFLEX MICROSCOPIC
Bilirubin Urine: NEGATIVE
Glucose, UA: NEGATIVE mg/dL
Hgb urine dipstick: NEGATIVE
Ketones, ur: NEGATIVE mg/dL
Leukocytes,Ua: NEGATIVE
Nitrite: NEGATIVE
Protein, ur: NEGATIVE mg/dL
Specific Gravity, Urine: 1.021 (ref 1.005–1.030)
pH: 5 (ref 5.0–8.0)

## 2023-11-06 NOTE — ED Notes (Signed)
 Resting with eyes closed, NAD, calm.

## 2023-11-06 NOTE — ED Notes (Signed)
 Patient is oriented to self only.  Patient is not amblatory r/t pain but usually ambulates with walker in memory care unit.  Fall risk precautions in place, bed rails up, bed alarm, non-skid footwear, sign on door and armband on patient.  Patient has been instructed to remain in bed and use the call light for help.

## 2023-11-06 NOTE — ED Notes (Signed)
 Verbal report given to [place] at [facility] and daughter has update.  [facility] transporting patient r/t safety concerns as p[atient is unable to stand or walk.  Explained this to family multiple times

## 2023-11-06 NOTE — ED Notes (Addendum)
 [name] Adams-daughter at [phone]  contacted this nurse to ask if granddaughter of patient can transport patient back to facility. Nurse educated paula that patient is on ambulatory and unable to go back to facility by POV without facility aware patient is coming back. Primary nurse has attempted to contact facility without success and has requested [facility] transport patient back to facility . [name] verbalized understanding

## 2023-11-06 NOTE — ED Triage Notes (Signed)
 Back pain started yesterday, mid/lower back

## 2023-11-06 NOTE — ED Provider Notes (Signed)
 [place] [facility] AT [facility] Provider Note   CSN: [id number] Arrival date & time: 11/06/23  9195     Patient presents with: Back Pain   Christine [name] is a 89 y.o. female.   Presenting with new onset LBP. PMH includes prior TIA, GERD, HTN, PAF on xarelto . Per triage note her pain started yesterday. Patient was brought in via EMS. Further history is limited by patient's mental status, patient has dementia at baseline.    Back Pain      Prior to Admission medications   Medication Sig Start Date End Date Taking? Authorizing Provider  acetaminophen  (TYLENOL ) 500 MG tablet Take 1,000 mg by mouth as needed for moderate pain.    [provider]  Cholecalciferol  (VITAMIN D3) 1000 UNITS CAPS Take 1,000 Units by mouth daily.     [provider]  denosumab  (PROLIA ) 60 MG/ML SOSY injection Inject 60 mg into the skin every 6 (six) months.    [provider]  divalproex (DEPAKOTE SPRINKLE) 125 MG capsule Take by mouth. 07/15/23   [provider]  donepezil  (ARICEPT ) 10 MG tablet Take 10 mg by mouth at bedtime. 08/29/20   [provider]  guaiFENesin -dextromethorphan  (ROBITUSSIN DM) 100-10 MG/5ML syrup Take 5 mLs by mouth every 4 (four) hours as needed for cough. 01/31/22   [name], MD  hydrOXYzine  (ATARAX /VISTARIL ) 25 MG tablet Take 50 mg by mouth at bedtime.  08/27/15   [provider]  LORazepam  (ATIVAN ) 1 MG tablet Take 1 mg by mouth at bedtime. 12/23/17   [provider]  losartan  (COZAAR ) 50 MG tablet Take 50 mg by mouth daily.    [provider]  memantine  (NAMENDA ) 5 MG tablet Take 5 mg by mouth daily.    [provider]  metoprolol  tartrate (LOPRESSOR ) 25 MG tablet Take 0.5 tablets (12.5 mg total) by mouth 2 (two) times daily. Patient not taking: Reported on 06/22/2023 03/15/21   [name], MD  mirabegron  ER (MYRBETRIQ ) 50 MG TB24 tablet Take 50 mg by mouth daily.  12/25/14   [provider]  pantoprazole  (PROTONIX ) 40 MG tablet Take 40 mg by mouth daily.    [provider]  pentosan polysulfate (ELMIRON ) 100 MG capsule Take 100 mg by mouth daily. 08/27/15   [provider]  Polyethylene Glycol 3350  (MIRALAX  PO) Take 17 g by mouth daily as needed (constipation). 10/07/09   [provider]  PRALUENT 150 MG/ML SOAJ Inject 150 mg into the skin every 21 ( twenty-one) days. 02/11/18   [provider]  QUEtiapine (SEROQUEL) 25 MG tablet Take 25 mg by mouth every evening. 09/07/23   [provider]  REPATHA SURECLICK 140 MG/ML SOAJ Inject 140 mg into the skin every 14 (fourteen) days. 06/18/23   [provider]  Rivaroxaban  (XARELTO ) 15 MG TABS tablet Take 1 tablet (15 mg total) by mouth daily with supper. Patient taking differently: Take 15 mg by mouth daily. 07/17/13   [name], MD  traMADol  (ULTRAM ) 50 MG tablet Take 50 mg by mouth as needed for moderate pain. 01/31/18   [provider]  vitamin B-12 (CYANOCOBALAMIN ) 1000 MCG tablet Take 1,000 mcg by mouth daily.    [provider]    Allergies: Atorvastatin, Citalopram, Duloxetine hcl, Ezetimibe, Fluvastatin, Gabapentin, Oxybutynin chloride, Pravastatin, Pregabalin , Simvastatin, Tape, Zolpidem , and Zolpidem  tartrate    Review of Systems  Musculoskeletal:  Positive for back pain.    Updated Vital Signs BP 112/87 (BP  Location: Left Arm)   Pulse 91   Temp 98.9 F (37.2 C) (Oral)   SpO2 93%   Physical Exam Constitutional:      Appearance: Normal appearance.  HENT:     Head: Normocephalic and atraumatic.     Mouth/Throat:     Mouth: Mucous membranes are moist.     Pharynx: Oropharynx is clear.  Eyes:     Extraocular Movements: Extraocular movements intact.     Conjunctiva/sclera: Conjunctivae normal.     Pupils: Pupils are equal, round, and reactive to light.  Cardiovascular:     Rate and Rhythm: Normal rate and  regular rhythm.  Pulmonary:     Effort: Pulmonary effort is normal.     Breath sounds: Normal breath sounds.  Abdominal:     General: Abdomen is flat. Bowel sounds are normal.     Palpations: Abdomen is soft.  Genitourinary:    General: Normal vulva.  Musculoskeletal:        General: Normal range of motion.     Cervical back: Normal range of motion.  Skin:    General: Skin is warm and dry.  Neurological:     Mental Status: She is alert.     (all labs ordered are listed, but only abnormal results are displayed) Labs Reviewed  URINALYSIS, ROUTINE W REFLEX MICROSCOPIC  BASIC METABOLIC PANEL WITH GFR  CBC WITH DIFFERENTIAL/PLATELET    EKG: None  Radiology: No results found.   Procedures   Medications Ordered in the ED - No data to display                                  Medical Decision Making This is an 89 yo patient presenting with 24 hours of LBP differential for this includes but is not limited to:  1. Urinary tract infection 2. Spontaneous retroperitoneal bleed 3. Osteoarthritis 4. MSK pain  Will obtain urine studies as well as CBC and BMET to look for infectious/metabolic involvement. Given patient's xarelto  use, will also obtain CT to assess for retroperitoneal bleed.   CT scan showed multiple small stones, no evidence of retroperitoneal bleed. UA is negative for signs of infection, white cell count is normal. Given multiple small stones, most likely etiology of her pain. Patient is stable for discharge and pain should be managed as appropriate.   Amount and/or Complexity of Data Reviewed Labs: ordered. Radiology: ordered.    Final diagnoses:  None    ED Discharge Orders     None          Cleotilde Lukes, DO 11/06/23 1120    Dasie Faden, MD 11/07/23 1017

## 2023-11-06 NOTE — ED Provider Notes (Signed)
 I saw and evaluated the patient, reviewed the resident's note and I agree with the findings and plan.      89 year old female with history of dementia presents with sudden onset of back pain which can yesterday.  On exam she seems to be tender along her lower back.  No rashes appreciated.  Urine and CT scan and blood work are pending   Dasie Faden, MD 11/06/23 [phone]

## 2023-11-06 NOTE — ED Notes (Addendum)
 Moved to h/w. Pending transport. Alert, NAD, calm at this time, recently restless, and wandering. Frequently climbs out of bed and frequently stopped and/or returned. Currently in view of nurses station. Diet ordered d/t LOS/ wait.

## 2023-11-07 ENCOUNTER — Observation Stay (HOSPITAL_COMMUNITY)

## 2023-11-07 ENCOUNTER — Emergency Department (HOSPITAL_COMMUNITY)

## 2023-11-07 ENCOUNTER — Inpatient Hospital Stay (HOSPITAL_COMMUNITY)
Admission: EM | Admit: 2023-11-07 | Discharge: 2023-11-15 | DRG: 689 | Disposition: A | Discharge status: Skilled Nursing Facility | Source: Skilled Nursing Facility | Attending: Internal Medicine | Admitting: Internal Medicine

## 2023-11-07 ENCOUNTER — Other Ambulatory Visit: Payer: Self-pay

## 2023-11-07 DIAGNOSIS — R296 Repeated falls: Secondary | ICD-10-CM | POA: Diagnosis present

## 2023-11-07 DIAGNOSIS — Z9842 Cataract extraction status, left eye: Secondary | ICD-10-CM

## 2023-11-07 DIAGNOSIS — Z961 Presence of intraocular lens: Secondary | ICD-10-CM | POA: Diagnosis present

## 2023-11-07 DIAGNOSIS — M19071 Primary osteoarthritis, right ankle and foot: Secondary | ICD-10-CM | POA: Diagnosis not present

## 2023-11-07 DIAGNOSIS — Z66 Do not resuscitate: Secondary | ICD-10-CM | POA: Diagnosis present

## 2023-11-07 DIAGNOSIS — W19XXXA Unspecified fall, initial encounter: Secondary | ICD-10-CM | POA: Diagnosis not present

## 2023-11-07 DIAGNOSIS — Z0389 Encounter for observation for other suspected diseases and conditions ruled out: Secondary | ICD-10-CM | POA: Diagnosis not present

## 2023-11-07 DIAGNOSIS — K219 Gastro-esophageal reflux disease without esophagitis: Secondary | ICD-10-CM | POA: Diagnosis present

## 2023-11-07 DIAGNOSIS — Z471 Aftercare following joint replacement surgery: Secondary | ICD-10-CM | POA: Diagnosis not present

## 2023-11-07 DIAGNOSIS — S79912A Unspecified injury of left hip, initial encounter: Secondary | ICD-10-CM | POA: Diagnosis not present

## 2023-11-07 DIAGNOSIS — E43 Unspecified severe protein-calorie malnutrition: Secondary | ICD-10-CM | POA: Diagnosis present

## 2023-11-07 DIAGNOSIS — Z515 Encounter for palliative care: Secondary | ICD-10-CM | POA: Diagnosis not present

## 2023-11-07 DIAGNOSIS — Z96642 Presence of left artificial hip joint: Secondary | ICD-10-CM | POA: Diagnosis present

## 2023-11-07 DIAGNOSIS — N3010 Interstitial cystitis (chronic) without hematuria: Secondary | ICD-10-CM | POA: Diagnosis present

## 2023-11-07 DIAGNOSIS — Z9109 Other allergy status, other than to drugs and biological substances: Secondary | ICD-10-CM

## 2023-11-07 DIAGNOSIS — I6782 Cerebral ischemia: Secondary | ICD-10-CM | POA: Diagnosis not present

## 2023-11-07 DIAGNOSIS — Z7189 Other specified counseling: Secondary | ICD-10-CM | POA: Diagnosis not present

## 2023-11-07 DIAGNOSIS — E559 Vitamin D deficiency, unspecified: Secondary | ICD-10-CM | POA: Diagnosis present

## 2023-11-07 DIAGNOSIS — Z8673 Personal history of transient ischemic attack (TIA), and cerebral infarction without residual deficits: Secondary | ICD-10-CM

## 2023-11-07 DIAGNOSIS — Z8619 Personal history of other infectious and parasitic diseases: Secondary | ICD-10-CM

## 2023-11-07 DIAGNOSIS — D649 Anemia, unspecified: Secondary | ICD-10-CM | POA: Diagnosis present

## 2023-11-07 DIAGNOSIS — R000 Tachycardia, unspecified: Secondary | ICD-10-CM | POA: Diagnosis not present

## 2023-11-07 DIAGNOSIS — M47812 Spondylosis without myelopathy or radiculopathy, cervical region: Secondary | ICD-10-CM | POA: Diagnosis not present

## 2023-11-07 DIAGNOSIS — Z87440 Personal history of urinary (tract) infections: Secondary | ICD-10-CM

## 2023-11-07 DIAGNOSIS — R911 Solitary pulmonary nodule: Secondary | ICD-10-CM | POA: Diagnosis not present

## 2023-11-07 DIAGNOSIS — I252 Old myocardial infarction: Secondary | ICD-10-CM

## 2023-11-07 DIAGNOSIS — Z860100 Personal history of colon polyps, unspecified: Secondary | ICD-10-CM

## 2023-11-07 DIAGNOSIS — F0390 Unspecified dementia without behavioral disturbance: Secondary | ICD-10-CM | POA: Diagnosis not present

## 2023-11-07 DIAGNOSIS — N200 Calculus of kidney: Secondary | ICD-10-CM | POA: Diagnosis present

## 2023-11-07 DIAGNOSIS — M79671 Pain in right foot: Secondary | ICD-10-CM | POA: Diagnosis present

## 2023-11-07 DIAGNOSIS — F05 Delirium due to known physiological condition: Secondary | ICD-10-CM | POA: Diagnosis not present

## 2023-11-07 DIAGNOSIS — Z7901 Long term (current) use of anticoagulants: Secondary | ICD-10-CM

## 2023-11-07 DIAGNOSIS — I480 Paroxysmal atrial fibrillation: Secondary | ICD-10-CM | POA: Diagnosis present

## 2023-11-07 DIAGNOSIS — N3000 Acute cystitis without hematuria: Principal | ICD-10-CM | POA: Diagnosis present

## 2023-11-07 DIAGNOSIS — Z90710 Acquired absence of both cervix and uterus: Secondary | ICD-10-CM

## 2023-11-07 DIAGNOSIS — Z888 Allergy status to other drugs, medicaments and biological substances status: Secondary | ICD-10-CM

## 2023-11-07 DIAGNOSIS — M1990 Unspecified osteoarthritis, unspecified site: Secondary | ICD-10-CM | POA: Diagnosis present

## 2023-11-07 DIAGNOSIS — E860 Dehydration: Secondary | ICD-10-CM | POA: Diagnosis present

## 2023-11-07 DIAGNOSIS — Z9841 Cataract extraction status, right eye: Secondary | ICD-10-CM

## 2023-11-07 DIAGNOSIS — G319 Degenerative disease of nervous system, unspecified: Secondary | ICD-10-CM | POA: Diagnosis not present

## 2023-11-07 DIAGNOSIS — Z6821 Body mass index (BMI) 21.0-21.9, adult: Secondary | ICD-10-CM

## 2023-11-07 DIAGNOSIS — Z79899 Other long term (current) drug therapy: Secondary | ICD-10-CM

## 2023-11-07 DIAGNOSIS — M2011 Hallux valgus (acquired), right foot: Secondary | ICD-10-CM | POA: Diagnosis not present

## 2023-11-07 DIAGNOSIS — R4701 Aphasia: Secondary | ICD-10-CM | POA: Diagnosis present

## 2023-11-07 DIAGNOSIS — E785 Hyperlipidemia, unspecified: Secondary | ICD-10-CM | POA: Diagnosis present

## 2023-11-07 DIAGNOSIS — I10 Essential (primary) hypertension: Secondary | ICD-10-CM | POA: Diagnosis present

## 2023-11-07 DIAGNOSIS — Z1152 Encounter for screening for COVID-19: Secondary | ICD-10-CM

## 2023-11-07 DIAGNOSIS — E872 Acidosis, unspecified: Secondary | ICD-10-CM | POA: Diagnosis present

## 2023-11-07 DIAGNOSIS — Z043 Encounter for examination and observation following other accident: Secondary | ICD-10-CM | POA: Diagnosis not present

## 2023-11-07 DIAGNOSIS — M8580 Other specified disorders of bone density and structure, unspecified site: Secondary | ICD-10-CM | POA: Diagnosis present

## 2023-11-07 LAB — CBC WITH DIFFERENTIAL/PLATELET
Abs Immature Granulocytes: 0.04 K/uL (ref 0.00–0.07)
Basophils Absolute: 0 K/uL (ref 0.0–0.1)
Basophils Relative: 0 %
Eosinophils Absolute: 0 K/uL (ref 0.0–0.5)
Eosinophils Relative: 0 %
HCT: 34.7 % — ABNORMAL LOW (ref 36.0–46.0)
Hemoglobin: 11.1 g/dL — ABNORMAL LOW (ref 12.0–15.0)
Immature Granulocytes: 1 %
Lymphocytes Relative: 8 %
Lymphs Abs: 0.7 K/uL (ref 0.7–4.0)
MCH: 29.3 pg (ref 26.0–34.0)
MCHC: 32 g/dL (ref 30.0–36.0)
MCV: 91.6 fL (ref 80.0–100.0)
Monocytes Absolute: 1.4 K/uL — ABNORMAL HIGH (ref 0.1–1.0)
Monocytes Relative: 17 %
Neutro Abs: 6.3 K/uL (ref 1.7–7.7)
Neutrophils Relative %: 74 %
Platelets: 160 K/uL (ref 150–400)
RBC: 3.79 MIL/uL — ABNORMAL LOW (ref 3.87–5.11)
RDW: 15.4 % (ref 11.5–15.5)
WBC: 8.4 K/uL (ref 4.0–10.5)
nRBC: 0 % (ref 0.0–0.2)

## 2023-11-07 LAB — COMPREHENSIVE METABOLIC PANEL WITH GFR
ALT: 18 U/L (ref 0–44)
AST: 19 U/L (ref 15–41)
Albumin: 3.5 g/dL (ref 3.5–5.0)
Alkaline Phosphatase: 66 U/L (ref 38–126)
Anion gap: 12 (ref 5–15)
BUN: 19 mg/dL (ref 8–23)
CO2: 24 mmol/L (ref 22–32)
Calcium: 9 mg/dL (ref 8.9–10.3)
Chloride: 105 mmol/L (ref 98–111)
Creatinine, Ser: 1.13 mg/dL — ABNORMAL HIGH (ref 0.44–1.00)
GFR, Estimated: 47 mL/min — ABNORMAL LOW (ref >60)
Glucose, Bld: 125 mg/dL — ABNORMAL HIGH (ref 70–99)
Potassium: 4.3 mmol/L (ref 3.5–5.1)
Sodium: 141 mmol/L (ref 135–145)
Total Bilirubin: 1.1 mg/dL (ref 0.0–1.2)
Total Protein: 7.1 g/dL (ref 6.5–8.1)

## 2023-11-07 LAB — RESP PANEL BY RT-PCR (RSV, FLU A&B, COVID)  RVPGX2
Influenza A by PCR: NEGATIVE
Influenza B by PCR: NEGATIVE
Resp Syncytial Virus by PCR: NEGATIVE
SARS Coronavirus 2 by RT PCR: NEGATIVE

## 2023-11-07 LAB — URINALYSIS, W/ REFLEX TO CULTURE (INFECTION SUSPECTED)
Bilirubin Urine: NEGATIVE
Glucose, UA: NEGATIVE mg/dL
Hgb urine dipstick: NEGATIVE
Ketones, ur: NEGATIVE mg/dL
Nitrite: NEGATIVE
Protein, ur: 100 mg/dL — AB
Specific Gravity, Urine: 1.021 (ref 1.005–1.030)
WBC, UA: >50 WBC/hpf (ref 0–5)
pH: 6 (ref 5.0–8.0)

## 2023-11-07 LAB — I-STAT CG4 LACTIC ACID, ED
Lactic Acid, Venous: 2 mmol/L (ref 0.5–1.9)
Lactic Acid, Venous: 0.9 mmol/L (ref 0.5–1.9)

## 2023-11-07 LAB — PROTIME-INR
INR: 1.1 (ref 0.8–1.2)
Prothrombin Time: 14.7 s (ref 11.4–15.2)

## 2023-11-07 MED ORDER — LACTATED RINGERS IV BOLUS: 500.0000 mL | Freq: Once | INTRAVENOUS | Status: DC

## 2023-11-07 MED ORDER — MIRABEGRON ER 25 MG PO TB24
50.0000 mg | ORAL_TABLET | Freq: Every day | ORAL | Status: DC
  Administered 2023-11-07 – 2023-11-15 (×9): 50 mg via ORAL
  Filled 2023-11-07: qty 1
  Filled 2023-11-07 (×8): qty 2

## 2023-11-07 MED ORDER — SODIUM CHLORIDE 0.9 % IV SOLN
1.0000 g | INTRAVENOUS | Status: DC
  Administered 2023-11-08 – 2023-11-12 (×5): 1 g via INTRAVENOUS
  Filled 2023-11-07 (×5): qty 10

## 2023-11-07 MED ORDER — LORAZEPAM 2 MG/ML IJ SOLN
0.5000 mg | Freq: Once | INTRAMUSCULAR | Status: AC
  Administered 2023-11-07: 0.5 mg via INTRAVENOUS
  Filled 2023-11-07: qty 1

## 2023-11-07 MED ORDER — DIVALPROEX SODIUM 125 MG PO CSDR
125.0000 mg | DELAYED_RELEASE_CAPSULE | Freq: Two times a day (BID) | ORAL | Status: DC
  Administered 2023-11-07 – 2023-11-15 (×16): 125 mg via ORAL
  Filled 2023-11-07 (×16): qty 1

## 2023-11-07 MED ORDER — QUETIAPINE 12.5 MG HALF TABLET
12.5000 mg | ORAL_TABLET | Freq: Every day | ORAL | Status: DC
Start: 2023-11-07 — End: 2023-11-13
  Administered 2023-11-07 – 2023-11-12 (×6): 12.5 mg via ORAL
  Filled 2023-11-07 (×6): qty 1

## 2023-11-07 MED ORDER — RIVAROXABAN 15 MG PO TABS
15.0000 mg | ORAL_TABLET | Freq: Every day | ORAL | Status: DC
  Administered 2023-11-07 – 2023-11-14 (×8): 15 mg via ORAL
  Filled 2023-11-07 (×9): qty 1

## 2023-11-07 MED ORDER — DONEPEZIL HCL 10 MG PO TABS
10.0000 mg | ORAL_TABLET | Freq: Every day | ORAL | Status: DC
  Administered 2023-11-07 – 2023-11-14 (×8): 10 mg via ORAL
  Filled 2023-11-07 (×8): qty 1

## 2023-11-07 MED ORDER — MORPHINE SULFATE (PF) 2 MG/ML IV SOLN
2.0000 mg | Freq: Once | INTRAVENOUS | Status: AC
  Administered 2023-11-07: 2 mg via INTRAVENOUS
  Filled 2023-11-07: qty 1

## 2023-11-07 MED ORDER — CEFTRIAXONE SODIUM 1 G IJ SOLR
1.0000 g | Freq: Once | INTRAMUSCULAR | Status: AC
  Administered 2023-11-07: 1 g via INTRAVENOUS
  Filled 2023-11-07: qty 10

## 2023-11-07 MED ORDER — LACTATED RINGERS IV SOLN: INTRAVENOUS | Status: DC

## 2023-11-07 MED ORDER — MORPHINE SULFATE (PF) 2 MG/ML IV SOLN
2.0000 mg | Freq: Four times a day (QID) | INTRAVENOUS | Status: DC | PRN
  Administered 2023-11-07 – 2023-11-08 (×3): 2 mg via INTRAVENOUS
  Filled 2023-11-07 (×3): qty 1

## 2023-11-07 MED ORDER — ACETAMINOPHEN 325 MG PO TABS
650.0000 mg | ORAL_TABLET | ORAL | Status: DC | PRN
  Administered 2023-11-09 – 2023-11-12 (×6): 650 mg via ORAL
  Filled 2023-11-07 (×6): qty 2

## 2023-11-07 MED ORDER — ACETAMINOPHEN 325 MG PO TABS
650.0000 mg | ORAL_TABLET | Freq: Once | ORAL | Status: AC
  Administered 2023-11-07: 650 mg via ORAL
  Filled 2023-11-07: qty 2

## 2023-11-07 MED ORDER — STROKE: EARLY STAGES OF RECOVERY BOOK: Freq: Once | Status: DC

## 2023-11-07 MED ORDER — VITAMIN B-12 1000 MCG PO TABS
1000.0000 ug | ORAL_TABLET | Freq: Every day | ORAL | Status: DC
  Administered 2023-11-07 – 2023-11-15 (×9): 1000 ug via ORAL
  Filled 2023-11-07 (×9): qty 1

## 2023-11-07 MED ORDER — METOPROLOL TARTRATE 5 MG/5ML IV SOLN
1.3000 mg | Freq: Once | INTRAVENOUS | Status: AC | PRN
  Administered 2023-11-07: 1.3 mg via INTRAVENOUS
  Filled 2023-11-07: qty 5

## 2023-11-07 MED ORDER — ACETAMINOPHEN 650 MG RE SUPP: 650.0000 mg | RECTAL | Status: DC | PRN

## 2023-11-07 MED ORDER — ACETAMINOPHEN 160 MG/5ML PO SOLN
650.0000 mg | ORAL | Status: DC | PRN
  Administered 2023-11-08: 650 mg
  Filled 2023-11-07: qty 20.3

## 2023-11-07 MED ORDER — MEMANTINE HCL 5 MG PO TABS
5.0000 mg | ORAL_TABLET | Freq: Every day | ORAL | Status: DC
  Administered 2023-11-08 – 2023-11-15 (×7): 5 mg via ORAL
  Filled 2023-11-07 (×8): qty 1

## 2023-11-07 NOTE — H&P (Signed)
 History and Physical    Patient: Christine [name] FMW:[id number] DOB: May 22, 1933 DOA: 11/07/2023 DOS: the patient was seen and examined on 11/07/2023 . PCP: Shayne Anes, MD  Patient coming from: Home Chief complaint: Chief Complaint  Patient presents with   Fall   HPI:  Christine [name] is a 89 y.o. female with past medical history  of allergies to 13 medications which includes their statin group, oxybutynin chloride, pregabalin , tape, zolpidem , Cymbalta, citalopram, Zetia, please see complete list, also past medical history of CAD/NSTEMI, PAF on Xarelto , GERD esophagitis, recurrent falls, AKI, history of rhabdo, essential hypertension, history of syncope and collapse, nutritional deficiencies with vitamin D  and B12, dementia, advanced age, rhabdomyolysis, history of UTI, presents today--- for falls and lethargy.  EMS found patient on the floor sitting but reports an unwitnessed fall in the past hour, on arrival patient did have left upper leg and hip pain and swelling in the left leg, per report patient was seen at [facility] yesterday and diagnosed with kidney stones.  Malodorous urine and increased congestion over the past few days.   ED Course:  Vital signs in the ED were notable for the following:  Vitals:   11/07/23 1100 11/07/23 1145 11/07/23 1329 11/07/23 1330  BP: (!) 157/66 98/83  (!) 150/34  Pulse: 78 90  83  Temp:   100.3 F (37.9 C)   Resp: 18 15  (!) 23  Height:      Weight:      SpO2: 98% 99%  98%  TempSrc:   Rectal   BMI (Calculated):       >>ED evaluation thus far shows: Vitals on presentation show fever and tachycardia. CMP shows glucose 125, AKI with a creatinine of 1.13 GFR 47 normal LFTs. Lactic acid of 2.0.  INR is 1.1. CBC shows anemia with a hemoglobin of 11.1, this seems to be chronic and ongoing since 2011.  Currently stable will defer to PCP to follow and manage. Abnormal Urinalysis.    >>While in the ED patient received the following: Medications   lactated ringers  infusion ( Intravenous New Bag/Given 11/07/23 1123)  divalproex (DEPAKOTE SPRINKLE) capsule 125 mg (has no administration in time range)  donepezil  (ARICEPT ) tablet 10 mg (has no administration in time range)  memantine  (NAMENDA ) tablet 5 mg (has no administration in time range)  QUEtiapine (SEROQUEL) tablet 12.5 mg (has no administration in time range)  cyanocobalamin  (VITAMIN B12) tablet 1,000 mcg (has no administration in time range)  Rivaroxaban  (XARELTO ) tablet 15 mg (has no administration in time range)  acetaminophen  (TYLENOL ) tablet 650 mg (650 mg Oral Given 11/07/23 1115)  morphine  (PF) 2 MG/ML injection 2 mg (2 mg Intravenous Given 11/07/23 1056)  cefTRIAXone  (ROCEPHIN ) 1 g in sodium chloride  0.9 % 100 mL IVPB (1 g Intravenous New Bag/Given 11/07/23 1325)   Review of Systems  Unable to perform ROS: Age  Musculoskeletal:  Positive for falls.  Neurological:  Positive for weakness.   Past Medical History:  Diagnosis Date   AR (allergic rhinitis)    Arthritis    Bilateral carotid artery stenosis    MILD --  40% BILATERAL ICA PER DOPPLER JULY 2014   Diverticulosis    Frequency of urination    GERD (gastroesophageal reflux disease)    History of colon polyps    ADENOMATOUS   History of Helicobacter pylori infection    History of shingles    History of TIA (transient ischemic attack)    Hyperlipidemia  Hypertension    Impaired memory    Interstitial cystitis    Iron deficiency    Nocturia    Osteopenia    PAF (paroxysmal atrial fibrillation) (HCC)    dx 07/ 2015   Pelvic pain in female    Urgency of urination    Vitamin D  deficiency    Wears glasses    Past Surgical History:  Procedure Laterality Date   ABDOMINAL HYSTERECTOMY     CATARACT EXTRACTION W/ INTRAOCULAR LENS  IMPLANT, BILATERAL  2009   CYSTO WITH HYDRODISTENSION  02/24/2011   Procedure: CYSTOSCOPY/HYDRODISTENSION;  Surgeon: Glendia DELENA Elizabeth, MD;  Location: [facility];   Service: Urology;  Laterality: N/A;  INSTILLATION OF marcaine  and  pyridium    CYSTO WITH HYDRODISTENSION N/A 06/20/2013   Procedure: CYSTOSCOPY/HYDRODISTENSION/INSTALLATION OF PYRIDIUM  -MARCAINE  0.5%.;  Surgeon: Glendia DELENA Elizabeth, MD;  Location: [facility];  Service: Urology;  Laterality: N/A;   CYSTO WITH HYDRODISTENSION N/A 03/22/2014   Procedure: CYSTOSCOPY/HYDRODISTENSION INSTILLATION OF MARCAINE  AND PYRDIUM ;  Surgeon: Glendia Elizabeth, MD;  Location: [facility];  Service: Urology;  Laterality: N/A;   CYSTO WITH HYDRODISTENSION N/A 09/17/2014   Procedure: CYSTOSCOPY HYDRODISTENSION INSTILLATION OF MARCAINE  AND PYRIDIUM ;  Surgeon: Glendia Elizabeth, MD;  Location: [facility];  Service: Urology;  Laterality: N/A;   CYSTO/ HOD/ INSTILLATION THERAPY  10-04-2008  &  05-02-2009   CYSTO/ LEFT URETEROSCOPY W/ LEFT URETERAL DILATION  04-15-2006   TOTAL HIP ARTHROPLASTY Left 05/29/2016   Procedure: TOTAL HIP ARTHROPLASTY ANTERIOR APPROACH;  Surgeon: Addie Glendia Hacker, MD;  Location: [facility];  Service: Orthopedics;  Laterality: Left;   TRANSTHORACIC ECHOCARDIOGRAM  07-17-2013   grade I diastolic dysfuntion/  ef 60-65%/  mild AR and MR/  trivial TR   URETEROLITHOTOMY Bilateral 11-24-2004 LEFT  &  05-29-2006  RIGHT   VAULT SUSPENSION WITH GRAFT/ CYSTOCELE REPAIR  09-17-2009    reports that she has never smoked. She has never used smokeless tobacco. She reports that she does not drink alcohol  and does not use drugs. Allergies  Allergen Reactions   Atorvastatin Other (See Comments)    hot flashes; felt like walking/leaning to L side; constipation   Citalopram Other (See Comments)    felt it made the inside and outside of her skin burn.it is not clear if this is an allergy or just an adverse reaction of some kind.   Duloxetine Hcl Other (See Comments)     she can't tolerate it and wanted it listed as an allergy   Ezetimibe Other (See Comments)    had  problems with it   Fluvastatin Other (See Comments)     made legs ache.   Gabapentin Other (See Comments)    hallucinations   Oxybutynin Chloride Other (See Comments)    throat swelling; burning eyelids; nausea   Pravastatin Other (See Comments)    had problems with it   Pregabalin  Other (See Comments)    made her feel funny   Simvastatin Other (See Comments)     leg pain   Tape Other (See Comments)    Bruising    Zolpidem      Other Reaction(s): hallucinations.   Zolpidem  Tartrate Other (See Comments)     hallucinations.   No family history on file. Prior to Admission medications   Medication Sig Start Date End Date Taking? Authorizing Provider  acetaminophen  (TYLENOL ) 500 MG tablet Take 1,000 mg by mouth as needed for moderate pain.    [provider]  Cholecalciferol  (VITAMIN D3) 1000 UNITS CAPS Take 1,000 Units by mouth daily.     [provider]  denosumab  (PROLIA ) 60 MG/ML SOSY injection Inject 60 mg into the skin every 6 (six) months.    [provider]  divalproex (DEPAKOTE SPRINKLE) 125 MG capsule Take by mouth. 07/15/23   [provider]  donepezil  (ARICEPT ) 10 MG tablet Take 10 mg by mouth at bedtime. 08/29/20   [provider]  guaiFENesin -dextromethorphan  (ROBITUSSIN DM) 100-10 MG/5ML syrup Take 5 mLs by mouth every 4 (four) hours as needed for cough. 01/31/22   [name], MD  hydrOXYzine  (ATARAX /VISTARIL ) 25 MG tablet Take 50 mg by mouth at bedtime.  08/27/15   [provider]  LORazepam  (ATIVAN ) 1 MG tablet Take 1 mg by mouth at bedtime. 12/23/17   [provider]  losartan  (COZAAR ) 50 MG tablet Take 50 mg by mouth daily.    [provider]  memantine  (NAMENDA ) 5 MG tablet Take 5 mg by mouth daily.    [provider]  metoprolol  tartrate (LOPRESSOR ) 25 MG tablet Take 0.5 tablets (12.5 mg total) by mouth 2 (two) times daily. Patient not taking: Reported on 06/22/2023 03/15/21   [name], MD  mirabegron  ER (MYRBETRIQ ) 50 MG TB24 tablet Take 50 mg by mouth daily. 12/25/14   [provider]  pantoprazole  (PROTONIX ) 40 MG tablet Take 40 mg by mouth daily.    [provider]  pentosan polysulfate (ELMIRON ) 100 MG capsule Take 100 mg by mouth daily. 08/27/15   [provider]  Polyethylene Glycol 3350  (MIRALAX  PO) Take 17 g by mouth daily as needed (constipation). 10/07/09   [provider]  PRALUENT 150 MG/ML SOAJ Inject 150 mg into the skin every 21 ( twenty-one) days. 02/11/18   [provider]  QUEtiapine (SEROQUEL) 25 MG tablet Take 25 mg by mouth every evening. 09/07/23   [provider]  REPATHA SURECLICK 140 MG/ML SOAJ Inject 140 mg into the skin every 14 (fourteen) days. 06/18/23   [provider]  Rivaroxaban  (XARELTO ) 15 MG TABS tablet Take 1 tablet (15 mg total) by mouth daily with supper. Patient taking differently: Take 15 mg by mouth daily. 07/17/13   [name], MD  traMADol  (ULTRAM ) 50 MG tablet Take 50 mg by mouth as needed for moderate pain. 01/31/18   [provider]  vitamin B-12 (CYANOCOBALAMIN ) 1000 MCG tablet Take 1,000 mcg by mouth daily.    [provider]                                                                                 Vitals:   11/07/23 1100 11/07/23 1145 11/07/23 1329 11/07/23 1330  BP: (!) 157/66 98/83  (!) 150/34  Pulse: 78 90  83  Resp: 18 15  (!) 23  Temp:   100.3 F (37.9 C)   TempSrc:   Rectal   SpO2: 98% 99%  98%  Weight:      Height:       Physical Exam Vitals reviewed.  Constitutional:      General: She is not in acute distress.    Appearance: She is  not ill-appearing.  HENT:     Head: Normocephalic.  Eyes:     Extraocular Movements: Extraocular movements intact.  Cardiovascular:     Rate and Rhythm: Normal rate and regular rhythm.     Heart sounds: Normal heart sounds.  Pulmonary:     Effort: Pulmonary effort is normal.      Breath sounds: Normal breath sounds.  Abdominal:     General: There is no distension.     Palpations: Abdomen is soft.     Tenderness: There is no abdominal tenderness.  Neurological:     General: No focal deficit present.     Mental Status: She is alert.     Cranial Nerves: Cranial nerves 2-12 are intact.     Motor: Weakness present.     Comments: Generalized weakness.   Psychiatric:        Attention and Perception: Attention normal.        Behavior: Behavior is cooperative.     Labs on Admission: I have personally reviewed following labs and imaging studies CBC: Recent Labs  Lab 11/06/23 0937 11/07/23 1050  WBC 6.4 8.4  NEUTROABS 4.0 6.3  HGB 11.1* 11.1*  HCT 35.5* 34.7*  MCV 91.5 91.6  PLT 135* 160   Basic Metabolic Panel: Recent Labs  Lab 11/06/23 0937 11/07/23 1050  NA 140 141  K 4.3 4.3  CL 104 105  CO2 24 24  GLUCOSE 108* 125*  BUN 22 19  CREATININE 1.01* 1.13*  CALCIUM  9.5 9.0   GFR: Estimated Creatinine Clearance: 29.7 mL/min (A) (by C-G formula based on SCr of 1.13 mg/dL (H)). Liver Function Tests: Recent Labs  Lab 11/07/23 1050  AST 19  ALT 18  ALKPHOS 66  BILITOT 1.1  PROT 7.1  ALBUMIN 3.5   No results for input(s): LIPASE, AMYLASE in the last 168 hours. No results for input(s): AMMONIA in the last 168 hours. Recent Labs    11/06/23 0937 11/07/23 1050  BUN 22 19  CREATININE 1.01* 1.13*    Cardiac Enzymes: No results for input(s): CKTOTAL, CKMB, CKMBINDEX, TROPONINI in the last 168 hours. BNP (last 3 results) No results for input(s): PROBNP in the last 8760 hours. HbA1C: No results for input(s): HGBA1C in the last 72 hours. CBG: No results for input(s): GLUCAP in the last 168 hours. Lipid Profile: No results for input(s): CHOL, HDL, LDLCALC, TRIG, CHOLHDL, LDLDIRECT in the last 72 hours. Thyroid  Function Tests: No results for input(s): TSH, T4TOTAL, FREET4, T3FREE, THYROIDAB in the last  72 hours. Anemia Panel: No results for input(s): VITAMINB12, FOLATE, FERRITIN, TIBC, IRON, RETICCTPCT in the last 72 hours. Urine analysis:    Component Value Date/Time   COLORURINE YELLOW 11/07/2023 1050   APPEARANCEUR CLOUDY (A) 11/07/2023 1050   LABSPEC 1.021 11/07/2023 1050   PHURINE 6.0 11/07/2023 1050   GLUCOSEU NEGATIVE 11/07/2023 1050   HGBUR NEGATIVE 11/07/2023 1050   BILIRUBINUR NEGATIVE 11/07/2023 1050   KETONESUR NEGATIVE 11/07/2023 1050   PROTEINUR 100 (A) 11/07/2023 1050   UROBILINOGEN 0.2 09/03/2014 1649   NITRITE NEGATIVE 11/07/2023 1050   LEUKOCYTESUR LARGE (A) 11/07/2023 1050   Radiological Exams on Admission: CT Cervical Spine Wo Contrast Result Date: 11/07/2023 CLINICAL DATA:  Clemens, history of it dimension EXAM: CT CERVICAL SPINE WITHOUT CONTRAST TECHNIQUE: Multidetector CT imaging of the cervical spine was performed without intravenous contrast. Multiplanar CT image reconstructions were also generated. RADIATION DOSE REDUCTION: This exam was performed according to the departmental dose-optimization program which includes automated exposure control,  adjustment of the mA and/or kV according to patient size and/or use of iterative reconstruction technique. COMPARISON:  07/14/2011 FINDINGS: Alignment: Reversal cervical lordosis with kyphosis centered at the C5-6 level, likely due to multilevel degenerative changes. Skull base and vertebrae: No acute fracture. No primary bone lesion or focal pathologic process. Soft tissues and spinal canal: No prevertebral fluid or swelling. No visible canal hematoma. Disc levels: Marked hypertrophic changes at the C1-C2 interface. Partial bony fusion across the disc space and bilateral facet joints at C2-3. Bony fusion across the bilateral facets at C4-5. There is multilevel spondylosis most pronounced at the C4-5, C5-6, and C6-7 levels. Diffuse multilevel facet hypertrophy greatest at C3-4. Upper chest: There is a spiculated  subpleural 1.2 x 0.9 cm left upper lobe pulmonary nodule, concerning for underlying neoplasm. Airway is patent. Other: Reconstructed images demonstrate no additional findings. IMPRESSION: 1. No acute cervical spine fracture. 2. Suspicious left solid pulmonary nodule within the upper lobe measuring 12 mm detected on incomplete chest CT. Per Fleischner Society Guidelines, recommend prompt non-contrast Chest CT for further evaluation if the patient would be a therapy candidate should neoplasm be detected. Reference: Radiology. 2017; 284(1):228-43. 3. Extensive multilevel cervical degenerative changes as above. Electronically Signed   By: Ozell Daring M.D.   On: 11/07/2023 12:50   CT Head Wo Contrast Result Date: 11/07/2023 CLINICAL DATA:  Clemens, history of dementia EXAM: CT HEAD WITHOUT CONTRAST TECHNIQUE: Contiguous axial images were obtained from the base of the skull through the vertex without intravenous contrast. RADIATION DOSE REDUCTION: This exam was performed according to the departmental dose-optimization program which includes automated exposure control, adjustment of the mA and/or kV according to patient size and/or use of iterative reconstruction technique. COMPARISON:  09/15/2022 FINDINGS: Brain: Stable chronic small-vessel ischemic changes throughout the periventricular white matter. No acute infarct or hemorrhage. Lateral ventricles and remaining midline structures are stable. No acute extra-axial fluid collections. No mass effect. Stable diffuse cerebral cortical atrophy. Vascular: No hyperdense vessel or unexpected calcification. Skull: Normal. Negative for fracture or focal lesion. Sinuses/Orbits: Mucosal thickening throughout the ethmoid air cells. No gas fluid levels. Trace left mastoid effusion. Other: None. IMPRESSION: 1. Stable head CT, no acute intracranial process. Electronically Signed   By: Ozell Daring M.D.   On: 11/07/2023 12:46   DG Knee 2 Views Left Result Date: 11/07/2023 EXAM:  1 or 2 VIEW(S) XRAY OF THE LEFT KNEE 11/07/2023 11:12:00 AM COMPARISON: None available. CLINICAL HISTORY: fall FINDINGS: BONES AND JOINTS: No acute fracture. No focal osseous lesion. No joint dislocation. Possible small suprapatellar joint effusion is noted. Severe narrowing of lateral joint space is noted. SOFT TISSUES: The soft tissues are unremarkable. IMPRESSION: 1. Severe lateral compartment joint space narrowing, consistent with advanced osteoarthritis. 2. Possible small suprapatellar joint effusion. Electronically signed by: Lynwood Seip MD 11/07/2023 11:20 AM EST RP Workstation: [id number]   DG Hip Unilat W or Wo Pelvis 2-3 Views Left Result Date: 11/07/2023 EXAM: 2 or 3 VIEW(S) XRAY OF THE LEFT HIP 11/07/2023 11:12:00 AM COMPARISON: 06/11/2016 status post left total hip arthroplasty. CLINICAL HISTORY: fall fall FINDINGS: BONES AND JOINTS: Status post left total hip arthroplasty. No acute fracture or focal osseous lesion. SOFT TISSUES: The soft tissues are unremarkable. IMPRESSION: 1. No acute fracture or dislocation. 2. Status post left total hip arthroplasty. Electronically signed by: Lynwood Seip MD 11/07/2023 11:19 AM EST RP Workstation: [id number]   DG Ankle Complete Left Result Date: 11/07/2023 EXAM: 3 OR MORE VIEWS XRAY OF THE LEFT ANKLE 11/07/2023  11:12:00 AM CLINICAL HISTORY: Fall. COMPARISON: None available. FINDINGS: BONES AND JOINTS: No acute fracture. No focal osseous lesion. No joint dislocation. SOFT TISSUES: The soft tissues are unremarkable. IMPRESSION: 1. No acute osseous abnormality. Electronically signed by: Lynwood Seip MD 11/07/2023 11:17 AM EST RP Workstation: [id number]   DG Chest Port 1 View Result Date: 11/07/2023 EXAM: 1 VIEW(S) XRAY OF THE CHEST 11/07/2023 11:12:00 AM COMPARISON: 01/29/2022 CLINICAL HISTORY: Questionable sepsis - evaluate for abnormality FINDINGS: LUNGS AND PLEURA: No focal pulmonary opacity. No pulmonary edema. No pleural effusion. No pneumothorax. HEART  AND MEDIASTINUM: No acute abnormality of the cardiac and mediastinal silhouettes. BONES AND SOFT TISSUES: No acute osseous abnormality. IMPRESSION: 1. No acute cardiopulmonary disease. Electronically signed by: Lynwood Seip MD 11/07/2023 11:15 AM EST RP Workstation: [id number]   CT Renal Stone Study Result Date: 11/06/2023 CLINICAL DATA:  Abdominal and flank pain. EXAM: CT ABDOMEN AND PELVIS WITHOUT CONTRAST TECHNIQUE: Multidetector CT imaging of the abdomen and pelvis was performed following the standard protocol without IV contrast. RADIATION DOSE REDUCTION: This exam was performed according to the departmental dose-optimization program which includes automated exposure control, adjustment of the mA and/or kV according to patient size and/or use of iterative reconstruction technique. COMPARISON:  02/18/2010 FINDINGS: Lower chest: No acute findings. Hepatobiliary: Several small hepatic cysts are again noted. No hepatic masses seen on this unenhanced exam. Gallbladder is unremarkable. No evidence of biliary ductal dilatation. Pancreas: No mass or inflammatory process visualized on this unenhanced exam. Spleen:  Within normal limits in size. Adrenals/Urinary tract: A few punctate left renal calculi are noted. No evidence of ureteral calculi or hydronephrosis. Poor visualization of distal ureters and bladder due to severe artifact from left hip prostheses. Stomach/Bowel: No evidence of obstruction, inflammatory process, or abnormal fluid collections. Normal appendix visualized. Diverticulosis is seen mainly involving the sigmoid colon, however there is no evidence of diverticulitis. Vascular/Lymphatic: No pathologically enlarged lymph nodes identified. No evidence of abdominal aortic aneurysm. Reproductive: Prior hysterectomy noted. Adnexal regions are unremarkable in appearance. Other:  None. Musculoskeletal:  No suspicious bone lesions identified. IMPRESSION: Tiny left renal calculi. No evidence of ureteral  calculi, hydronephrosis, or other acute findings. Colonic diverticulosis, without radiographic evidence of diverticulitis. Electronically Signed   By: Norleen DELENA Kil M.D.   On: 11/06/2023 10:27   Data Reviewed: Relevant notes from primary care and specialist visits, past discharge summaries as available in EHR, including Care Everywhere . Prior diagnostic testing as pertinent to current admission diagnoses, Updated medications and problem lists for reconciliation .ED course, including vitals, labs, imaging, treatment and response to treatment,Triage notes, nursing and pharmacy notes and ED provider's notes.Notable results as noted in HPI.Discussed case with EDMD/ ED APP/ or Specialty MD on call and as needed.  Assessment & Plan   >>Falls: 2/2 gen weakness from her acute Uti and lactic acidosis / dehydration.  Fall precaution and PT eval Prior to discharge.   >>Aphasia: Per daughter paula at bedside . Mom is not speaking or moving as her baseline. Which is ambulatory  with walker. We will obtain MRI, suspect pt has has a stroke already being on Xarelto  for her A-fib patient does not meet criteria for thrombectomy if she needs.  Daughter agrees for MRI and low-dose sedation if needed.   >>UTI: We will cont pt on Rocephin .  Patient had been on LR 150 and I have decreased the rate now to 75 cc/h for the next 20 hours.   >> Dementia: Continue patient on Namenda , Aricept , quetiapine dose  decreased to 12.5 to avoid excessive sedation, Depakote continued.   DVT prophylaxis:  Xarelto  Consults:  None  Advance Care Planning:    Code Status: Limited: Do not attempt resuscitation (DNR) -DNR-LIMITED -Do Not Intubate/DNI    Family Communication:  Paula-daughter Disposition Plan:  SNF memory care unit Severity of Illness: The appropriate patient status for this patient is INPATIENT. Inpatient status is judged to be reasonable and necessary in order to provide the required intensity of service to  ensure the patient's safety. The patient's presenting symptoms, physical exam findings, and initial radiographic and laboratory data in the context of their chronic comorbidities is felt to place them at high risk for further clinical deterioration. Furthermore, it is not anticipated that the patient will be medically stable for discharge from the hospital within 2 midnights of admission.   * I certify that at the point of admission it is my clinical judgment that the patient will require inpatient hospital care spanning beyond 2 midnights from the point of admission due to high intensity of service, high risk for further deterioration and high frequency of surveillance required.*  Unresulted Labs (From admission, onward)     Start     Ordered   11/07/23 1457  Lipid panel  (Labs)  Add-on,   AD       Comments: Fasting    11/07/23 1457   11/07/23 1455  Hemoglobin A1c  (Labs)  Once,   R       Comments: To assess prior glycemic control    11/07/23 1457   11/07/23 1050  Blood Culture (routine x 2)  (Septic presentation on arrival (screening labs, nursing and treatment orders for obvious sepsis))  BLOOD CULTURE X 2,   STAT      11/07/23 1050            Meds ordered this encounter  Medications   lactated ringers  infusion   acetaminophen  (TYLENOL ) tablet 650 mg   DISCONTD: lactated ringers  bolus 500 mL   morphine  (PF) 2 MG/ML injection 2 mg   cefTRIAXone  (ROCEPHIN ) 1 g in sodium chloride  0.9 % 100 mL IVPB    Antibiotic Indication::   UTI   divalproex (DEPAKOTE SPRINKLE) capsule 125 mg   donepezil  (ARICEPT ) tablet 10 mg   memantine  (NAMENDA ) tablet 5 mg   QUEtiapine (SEROQUEL) tablet 12.5 mg   cyanocobalamin  (VITAMIN B12) tablet 1,000 mcg   Rivaroxaban  (XARELTO ) tablet 15 mg   mirabegron  ER (MYRBETRIQ ) tablet 50 mg   cefTRIAXone  (ROCEPHIN ) 1 g in sodium chloride  0.9 % 100 mL IVPB    Antibiotic Indication::   UTI    stroke: early stages of recovery book   OR Linked Order Group     acetaminophen  (TYLENOL ) tablet 650 mg    acetaminophen  (TYLENOL ) 160 MG/5ML solution 650 mg    acetaminophen  (TYLENOL ) suppository 650 mg     Orders Placed This Encounter  Procedures   Resp panel by RT-PCR (RSV, Flu A&B, Covid) Anterior Nasal Swab   Blood Culture (routine x 2)   CT Cervical Spine Wo Contrast   CT Head Wo Contrast   DG Chest Port 1 View   DG Hip Unilat W or Wo Pelvis 2-3 Views Left   DG Knee 2 Views Left   DG Ankle Complete Left   MR BRAIN WO CONTRAST   Comprehensive metabolic panel   CBC with Differential   Protime-INR   Urinalysis, w/ Reflex to Culture (Infection Suspected) -Urine, Catheterized   Lipid panel   Hemoglobin  A1c   Diet NPO time specified   Document height and weight   Document vital signs within 1-hour of fluid bolus completion. Notify provider of abnormal vital signs despite fluid resuscitation.   Initiate Carrier Fluid Protocol   In and Out Cath   Check Rectal Temperature   Strict intake and output   NIHSS score documentation NIHSS score range: 0-42   Vital signs   Notify physician (specify)   Activity as tolerated   Swallow screen - If patient does NOT pass this screen, place order for SLP eval and treat (SLP2) - swallowing evaluation (BSE, MBS and/or diet order as indicated)   NIH Stroke Scale   Intake and output   Cardiac Monitoring Continuous x 24 hours Indications for use: Acute neurological event   Apply Stroke Care Plan: Ischemic Stroke, TIA   Discuss with patient and document patient's goals for stroke risk factor reduction   Initiate Oral Care Protocol   Initiate Carrier Fluid Protocol   Provide stroke education material to patient and family.   Nurse to provide smoking / tobacco cessation education   If the patient has passed the Stroke Swallow Screen or has a feeding tube, then RN may order General Admission PRN Orders (through manage orders) for the following patient needs: allergy symptoms (Claritin), cold sores (Carmex), cough  (Robitussin DM), eye irritation (Liquifilm Tears), hemorrhoids (Tucks), indigestion (Maalox), minor skin irritation (hydrocortisone cream), muscle pain Lucienne Gay), nose irritation (saline nasal spray) and sore throat (Chloraseptic spray).   Do not attempt resuscitation (DNR)- Limited -Do Not Intubate (DNI)   Consult to hospitalist  Fever, UTI, AMS   Consult to Registered Dietitian   Consult to Transition of Care Team   OT eval and treat   PT eval and treat   Oxygen therapy Mode or (Route): Nasal cannula; Liters Per Minute: 2; Keep O2 saturation between: greater than 94 %   SLP eval and treat Reason for evaluation: Cognitive/Language evaluation   I-Stat Lactic Acid, ED   EKG 12-Lead   ED EKG   ECHOCARDIOGRAM COMPLETE   Place in observation (patient's expected length of stay will be less than 2 midnights)   Fall precautions   Aspiration precautions    Author: Mario LULLA Blanch, MD 12 pm- 8 pm. [facility]. 11/07/2023 2:58 PM Please note for any communication after hours contact [facility] Assigned provider on call on Amion.

## 2023-11-07 NOTE — Hospital Course (Addendum)
 [name]

## 2023-11-07 NOTE — ED Notes (Signed)
 CCMD called by this RN

## 2023-11-07 NOTE — ED Notes (Signed)
 Awaiting imaging results to cath for urine sample. Hoy, Christine [name] aware and agreed

## 2023-11-07 NOTE — ED Notes (Signed)
 Phlebotomy at bedside.

## 2023-11-07 NOTE — ED Triage Notes (Addendum)
 Pt Christine [name] after an unwitnessed fall in the last hour. EMS found her on the floor, sitting on her bottom. C/o L upper leg and hip pain. No obvious deformities. Swelling in L leg, baseline according to [facility] staff. A&O x2 at baseline. On Elliquis. Unknown LOC or head injury. C Collar in place by EMS   Seen at [facility] diagnosed with kidney stones.

## 2023-11-07 NOTE — ED Notes (Signed)
 Patient transported to MRI

## 2023-11-07 NOTE — ED Provider Notes (Signed)
 Christine [name] Provider Note   CSN: [id number] Arrival date & time: 11/07/23  1029     Patient presents with: Christine [name] is a 89 y.o. female OA, GERD, HLD, HTN, IDA, PAF dementia who presents to ED concern for unwitnessed fall.  Patient with a history of dementia.  History is limited.  EMS stating that SNF staff walked into patient's room and she was sitting on the ground and complaining of left leg/hip pain.  Granddaughter at bedside stating that patient has a mild chronic cough at baseline, but has been having increased congestion over the past couple days.  Granddaughter denies any other recent infectious symptoms such as nausea, vomiting, diarrhea, malodorous urine.    Fall       Prior to Admission medications   Medication Sig Start Date End Date Taking? Authorizing Provider  acetaminophen  (TYLENOL ) 500 MG tablet Take 1,000 mg by mouth as needed for moderate pain.    [provider]  Cholecalciferol  (VITAMIN D3) 1000 UNITS CAPS Take 1,000 Units by mouth daily.     [provider]  denosumab  (PROLIA ) 60 MG/ML SOSY injection Inject 60 mg into the skin every 6 (six) months.    [provider]  divalproex (DEPAKOTE SPRINKLE) 125 MG capsule Take by mouth. 07/15/23   [provider]  donepezil  (ARICEPT ) 10 MG tablet Take 10 mg by mouth at bedtime. 08/29/20   [provider]  guaiFENesin -dextromethorphan  (ROBITUSSIN DM) 100-10 MG/5ML syrup Take 5 mLs by mouth every 4 (four) hours as needed for cough. 01/31/22   Christine Coria, MD  hydrOXYzine  (ATARAX /VISTARIL ) 25 MG tablet Take 50 mg by mouth at bedtime.  08/27/15   [provider]  LORazepam  (ATIVAN ) 1 MG tablet Take 1 mg by mouth at bedtime. 12/23/17   [provider]  losartan  (COZAAR ) 50 MG tablet Take 50 mg by mouth daily.    [provider]  memantine  (NAMENDA ) 5 MG tablet Take 5 mg by mouth daily.    [provider]  metoprolol  tartrate (LOPRESSOR ) 25 MG tablet Take 0.5 tablets (12.5 mg total) by mouth 2 (two) times daily. Patient not taking: Reported on 06/22/2023 03/15/21   [name], MD  mirabegron  ER (MYRBETRIQ ) 50 MG TB24 tablet Take 50 mg by mouth daily. 12/25/14   [provider]  pantoprazole  (PROTONIX ) 40 MG tablet Take 40 mg by mouth daily.    [provider]  pentosan polysulfate (ELMIRON ) 100 MG capsule Take 100 mg by mouth daily. 08/27/15   [provider]  Polyethylene Glycol 3350  (MIRALAX  PO) Take 17 g by mouth daily as needed (constipation). 10/07/09   [provider]  PRALUENT 150 MG/ML SOAJ Inject 150 mg into the skin every 21 ( twenty-one) days. 02/11/18   [provider]  QUEtiapine (SEROQUEL) 25 MG tablet Take 25 mg by mouth every evening. 09/07/23   [provider]  REPATHA SURECLICK 140 MG/ML SOAJ Inject 140 mg into the skin every 14 (fourteen) days. 06/18/23   [provider]  Rivaroxaban  (XARELTO ) 15 MG TABS tablet Take 1 tablet (15 mg total) by mouth daily with supper. Patient taking differently: Take 15 mg by mouth daily. 07/17/13   Christine Algis SAILOR, MD  traMADol  (ULTRAM ) 50 MG tablet Take 50 mg by mouth as needed for moderate pain. 01/31/18   [provider]  vitamin B-12 (CYANOCOBALAMIN ) 1000 MCG tablet Take 1,000 mcg by mouth daily.  [provider]    Allergies: Atorvastatin, Citalopram, Duloxetine hcl, Ezetimibe, Fluvastatin, Gabapentin, Oxybutynin chloride, Pravastatin, Pregabalin , Simvastatin, Tape, Zolpidem , and Zolpidem  tartrate    Review of Systems  Musculoskeletal:        Fall    Updated Vital Signs BP (!) 150/34   Pulse 83   Temp 100.3 F (37.9 C) (Rectal)   Resp (!) 23   Ht 5' 4 (1.626 m)   Wt 56.4 kg   SpO2 98%   BMI 21.34 kg/m   Physical Exam Vitals and nursing note reviewed.  Constitutional:      General: She is not in acute distress.    Appearance:  She is not ill-appearing or toxic-appearing.  HENT:     Head: Normocephalic and atraumatic.     Mouth/Throat:     Mouth: Mucous membranes are moist.  Eyes:     General: No scleral icterus.       Right eye: No discharge.        Left eye: No discharge.     Conjunctiva/sclera: Conjunctivae normal.  Cardiovascular:     Rate and Rhythm: Normal rate and regular rhythm.     Pulses: Normal pulses.     Heart sounds: No murmur heard. Pulmonary:     Effort: Pulmonary effort is normal. No respiratory distress.     Breath sounds: Normal breath sounds. No wheezing, rhonchi or rales.  Abdominal:     General: Abdomen is flat. Bowel sounds are normal. There is no distension.     Palpations: Abdomen is soft. There is no mass.     Tenderness: There is no abdominal tenderness.  Musculoskeletal:     Right lower leg: No edema.     Left lower leg: No edema.     Comments: Patient grimacing when palpating left ankle, knee, hip.  No tenderness when palpating spine.  No other grimacing with tenderness of other joints.  Skin:    General: Skin is warm and dry.     Findings: No rash.  Neurological:     General: No focal deficit present.     Mental Status: She is alert. Mental status is at baseline.     Comments: Pleasantly demented.  No obvious unilateral neurodeficits.  Psychiatric:        Mood and Affect: Mood normal.     (all labs ordered are listed, but only abnormal results are displayed) Labs Reviewed  COMPREHENSIVE METABOLIC PANEL WITH GFR - Abnormal; Notable for the following components:      Result Value   Glucose, Bld 125 (*)    Creatinine, Ser 1.13 (*)    GFR, Estimated 47 (*)    All other components within normal limits  CBC WITH DIFFERENTIAL/PLATELET - Abnormal; Notable for the following components:   RBC 3.79 (*)    Hemoglobin 11.1 (*)    HCT 34.7 (*)    Monocytes Absolute 1.4 (*)    All other components within normal limits  URINALYSIS, W/ REFLEX TO CULTURE (INFECTION SUSPECTED) -  Abnormal; Notable for the following components:   APPearance CLOUDY (*)    Protein, ur 100 (*)    Leukocytes,Ua LARGE (*)    Bacteria, UA RARE (*)    All other components within normal limits  I-STAT CG4 LACTIC ACID, ED - Abnormal; Notable for the following components:   Lactic Acid, Venous 2.0 (*)    All other components within normal limits  RESP PANEL BY RT-PCR (RSV, FLU A&B, COVID)  RVPGX2  CULTURE, BLOOD (  ROUTINE X 2)  CULTURE, BLOOD (ROUTINE X 2)  PROTIME-INR  LIPID PANEL  HEMOGLOBIN A1C  I-STAT CG4 LACTIC ACID, ED    EKG: None  Radiology: CT Cervical Spine Wo Contrast Result Date: 11/07/2023 CLINICAL DATA:  Clemens, history of it dimension EXAM: CT CERVICAL SPINE WITHOUT CONTRAST TECHNIQUE: Multidetector CT imaging of the cervical spine was performed without intravenous contrast. Multiplanar CT image reconstructions were also generated. RADIATION DOSE REDUCTION: This exam was performed according to the departmental dose-optimization program which includes automated exposure control, adjustment of the mA and/or kV according to patient size and/or use of iterative reconstruction technique. COMPARISON:  07/14/2011 FINDINGS: Alignment: Reversal cervical lordosis with kyphosis centered at the C5-6 level, likely due to multilevel degenerative changes. Skull base and vertebrae: No acute fracture. No primary bone lesion or focal pathologic process. Soft tissues and spinal canal: No prevertebral fluid or swelling. No visible canal hematoma. Disc levels: Marked hypertrophic changes at the C1-C2 interface. Partial bony fusion across the disc space and bilateral facet joints at C2-3. Bony fusion across the bilateral facets at C4-5. There is multilevel spondylosis most pronounced at the C4-5, C5-6, and C6-7 levels. Diffuse multilevel facet hypertrophy greatest at C3-4. Upper chest: There is a spiculated subpleural 1.2 x 0.9 cm left upper lobe pulmonary nodule, concerning for underlying neoplasm. Airway  is patent. Other: Reconstructed images demonstrate no additional findings. IMPRESSION: 1. No acute cervical spine fracture. 2. Suspicious left solid pulmonary nodule within the upper lobe measuring 12 mm detected on incomplete chest CT. Per Fleischner Society Guidelines, recommend prompt non-contrast Chest CT for further evaluation if the patient would be a therapy candidate should neoplasm be detected. Reference: Radiology. 2017; 284(1):228-43. 3. Extensive multilevel cervical degenerative changes as above. Electronically Signed   By: Ozell Daring M.D.   On: 11/07/2023 12:50   CT Head Wo Contrast Result Date: 11/07/2023 CLINICAL DATA:  Clemens, history of dementia EXAM: CT HEAD WITHOUT CONTRAST TECHNIQUE: Contiguous axial images were obtained from the base of the skull through the vertex without intravenous contrast. RADIATION DOSE REDUCTION: This exam was performed according to the departmental dose-optimization program which includes automated exposure control, adjustment of the mA and/or kV according to patient size and/or use of iterative reconstruction technique. COMPARISON:  09/15/2022 FINDINGS: Brain: Stable chronic small-vessel ischemic changes throughout the periventricular white matter. No acute infarct or hemorrhage. Lateral ventricles and remaining midline structures are stable. No acute extra-axial fluid collections. No mass effect. Stable diffuse cerebral cortical atrophy. Vascular: No hyperdense vessel or unexpected calcification. Skull: Normal. Negative for fracture or focal lesion. Sinuses/Orbits: Mucosal thickening throughout the ethmoid air cells. No gas fluid levels. Trace left mastoid effusion. Other: None. IMPRESSION: 1. Stable head CT, no acute intracranial process. Electronically Signed   By: Ozell Daring M.D.   On: 11/07/2023 12:46   DG Knee 2 Views Left Result Date: 11/07/2023 EXAM: 1 or 2 VIEW(S) XRAY OF THE LEFT KNEE 11/07/2023 11:12:00 AM COMPARISON: None available. CLINICAL  HISTORY: fall FINDINGS: BONES AND JOINTS: No acute fracture. No focal osseous lesion. No joint dislocation. Possible small suprapatellar joint effusion is noted. Severe narrowing of lateral joint space is noted. SOFT TISSUES: The soft tissues are unremarkable. IMPRESSION: 1. Severe lateral compartment joint space narrowing, consistent with advanced osteoarthritis. 2. Possible small suprapatellar joint effusion. Electronically signed by: Lynwood Seip MD 11/07/2023 11:20 AM EST RP Workstation: [id number]   DG Hip Unilat W or Wo Pelvis 2-3 Views Left Result Date: 11/07/2023 EXAM: 2 or 3 VIEW(S) XRAY  OF THE LEFT HIP 11/07/2023 11:12:00 AM COMPARISON: 06/11/2016 status post left total hip arthroplasty. CLINICAL HISTORY: fall fall FINDINGS: BONES AND JOINTS: Status post left total hip arthroplasty. No acute fracture or focal osseous lesion. SOFT TISSUES: The soft tissues are unremarkable. IMPRESSION: 1. No acute fracture or dislocation. 2. Status post left total hip arthroplasty. Electronically signed by: Lynwood Seip MD 11/07/2023 11:19 AM EST RP Workstation: [id number]   DG Ankle Complete Left Result Date: 11/07/2023 EXAM: 3 OR MORE VIEWS XRAY OF THE LEFT ANKLE 11/07/2023 11:12:00 AM CLINICAL HISTORY: Fall. COMPARISON: None available. FINDINGS: BONES AND JOINTS: No acute fracture. No focal osseous lesion. No joint dislocation. SOFT TISSUES: The soft tissues are unremarkable. IMPRESSION: 1. No acute osseous abnormality. Electronically signed by: Lynwood Seip MD 11/07/2023 11:17 AM EST RP Workstation: [id number]   DG Chest Port 1 View Result Date: 11/07/2023 EXAM: 1 VIEW(S) XRAY OF THE CHEST 11/07/2023 11:12:00 AM COMPARISON: 01/29/2022 CLINICAL HISTORY: Questionable sepsis - evaluate for abnormality FINDINGS: LUNGS AND PLEURA: No focal pulmonary opacity. No pulmonary edema. No pleural effusion. No pneumothorax. HEART AND MEDIASTINUM: No acute abnormality of the cardiac and mediastinal silhouettes. BONES AND SOFT  TISSUES: No acute osseous abnormality. IMPRESSION: 1. No acute cardiopulmonary disease. Electronically signed by: Lynwood Seip MD 11/07/2023 11:15 AM EST RP Workstation: [id number]   CT Renal Stone Study Result Date: 11/06/2023 CLINICAL DATA:  Abdominal and flank pain. EXAM: CT ABDOMEN AND PELVIS WITHOUT CONTRAST TECHNIQUE: Multidetector CT imaging of the abdomen and pelvis was performed following the standard protocol without IV contrast. RADIATION DOSE REDUCTION: This exam was performed according to the departmental dose-optimization program which includes automated exposure control, adjustment of the mA and/or kV according to patient size and/or use of iterative reconstruction technique. COMPARISON:  02/18/2010 FINDINGS: Lower chest: No acute findings. Hepatobiliary: Several small hepatic cysts are again noted. No hepatic masses seen on this unenhanced exam. Gallbladder is unremarkable. No evidence of biliary ductal dilatation. Pancreas: No mass or inflammatory process visualized on this unenhanced exam. Spleen:  Within normal limits in size. Adrenals/Urinary tract: A few punctate left renal calculi are noted. No evidence of ureteral calculi or hydronephrosis. Poor visualization of distal ureters and bladder due to severe artifact from left hip prostheses. Stomach/Bowel: No evidence of obstruction, inflammatory process, or abnormal fluid collections. Normal appendix visualized. Diverticulosis is seen mainly involving the sigmoid colon, however there is no evidence of diverticulitis. Vascular/Lymphatic: No pathologically enlarged lymph nodes identified. No evidence of abdominal aortic aneurysm. Reproductive: Prior hysterectomy noted. Adnexal regions are unremarkable in appearance. Other:  None. Musculoskeletal:  No suspicious bone lesions identified. IMPRESSION: Tiny left renal calculi. No evidence of ureteral calculi, hydronephrosis, or other acute findings. Colonic diverticulosis, without radiographic evidence  of diverticulitis. Electronically Signed   By: Norleen DELENA Kil M.D.   On: 11/06/2023 10:27     Procedures   Medications Ordered in the ED  lactated ringers  infusion ( Intravenous Rate/Dose Change 11/07/23 1458)  divalproex (DEPAKOTE SPRINKLE) capsule 125 mg (has no administration in time range)  donepezil  (ARICEPT ) tablet 10 mg (has no administration in time range)  memantine  (NAMENDA ) tablet 5 mg (has no administration in time range)  QUEtiapine (SEROQUEL) tablet 12.5 mg (has no administration in time range)  cyanocobalamin  (VITAMIN B12) tablet 1,000 mcg (1,000 mcg Oral Given 11/07/23 1458)  Rivaroxaban  (XARELTO ) tablet 15 mg (has no administration in time range)  mirabegron  ER (MYRBETRIQ ) tablet 50 mg (50 mg Oral Given 11/07/23 1458)  cefTRIAXone  (ROCEPHIN ) 1 g in sodium chloride  0.9 %  100 mL IVPB (has no administration in time range)   stroke: early stages of recovery book (0 each Does not apply Hold 11/07/23 1506)  acetaminophen  (TYLENOL ) tablet 650 mg (has no administration in time range)    Or  acetaminophen  (TYLENOL ) 160 MG/5ML solution 650 mg (has no administration in time range)    Or  acetaminophen  (TYLENOL ) suppository 650 mg (has no administration in time range)  acetaminophen  (TYLENOL ) tablet 650 mg (650 mg Oral Given 11/07/23 1115)  morphine  (PF) 2 MG/ML injection 2 mg (2 mg Intravenous Given 11/07/23 1056)  cefTRIAXone  (ROCEPHIN ) 1 g in sodium chloride  0.9 % 100 mL IVPB (0 g Intravenous Stopped 11/07/23 1421)                                    Medical Decision Making Amount and/or Complexity of Data Reviewed Labs: ordered. Radiology: ordered.  Risk OTC drugs. Prescription drug management.   This patient presents to the ED after a fall, this involves an extensive number of treatment options, and is a complaint that carries with it a high risk of complications and morbidity.  The differential diagnosis includes  intracranial hemorrhage, subdural/epidural hematoma, vertebral  fracture, spinal cord injury, muscle strain, skull fracture, fracture.   Co morbidities that complicate the patient evaluation  See HPI above   Additional history obtained:  Dr. Shayne PCP   Problem List / ED Course / Critical interventions / Medication management  Patient presented for unwitnessed fall.  History and physical exam limited due to dementia.  Patient grimacing with palpation of left ankle, knee, hip.  Other joints palpated without obvious pain.  Vitals notable for fever of 101.6 which decreased to 100.3 after Tylenol . I Ordered, and personally interpreted labs.  UA concerning for infection with large leukocytes, rare bacteria, 50 WBC.  CBC without leukocytosis.  There is mild anemia with hemoglobin 11.1.  CMP with slight elevation in creatinine at 1.13.  PT/INR within normal limits.  Respiratory panel negative. The patient was maintained on a cardiac monitor.  I personally viewed and interpreted the cardiac monitored which showed an underlying rhythm of: Sinus rhythm I ordered imaging studies including left ankle/knee/hip x-ray, CT head/cervical spine, chest x-ray. I independently visualized and interpreted imaging which showed no acute process. I agree with the radiologist interpretation. Family ember later stated that patient is having right foot pain with movement.  Will add on right foot x-ray. Patient with fever despite Tylenol  and associated UTI with AMS.  I believe she will benefit from admission.  Family member at bedside agreeable with plan. I requested consultation with the hospitalist on-call Dr. Tobie,  and discussed lab and imaging findings as well as pertinent plan - they agree to admit patient I have reviewed the patients home medicines and have made adjustments as needed    Social Determinants of Health:  geriatric        Final diagnoses:  Acute cystitis without hematuria  Fall, initial encounter    ED Discharge Orders     None           Hoy Nidia JULIANNA DEVONNA 11/07/23 1518    Patsey Lot, MD 11/08/23 [phone]

## 2023-11-08 ENCOUNTER — Observation Stay (HOSPITAL_COMMUNITY)

## 2023-11-08 DIAGNOSIS — K219 Gastro-esophageal reflux disease without esophagitis: Secondary | ICD-10-CM | POA: Diagnosis not present

## 2023-11-08 DIAGNOSIS — Z860100 Personal history of colon polyps, unspecified: Secondary | ICD-10-CM | POA: Diagnosis not present

## 2023-11-08 DIAGNOSIS — I252 Old myocardial infarction: Secondary | ICD-10-CM | POA: Diagnosis not present

## 2023-11-08 DIAGNOSIS — F05 Delirium due to known physiological condition: Secondary | ICD-10-CM | POA: Diagnosis not present

## 2023-11-08 DIAGNOSIS — M1990 Unspecified osteoarthritis, unspecified site: Secondary | ICD-10-CM | POA: Diagnosis present

## 2023-11-08 DIAGNOSIS — R1310 Dysphagia, unspecified: Secondary | ICD-10-CM | POA: Diagnosis not present

## 2023-11-08 DIAGNOSIS — F02811 Dementia in other diseases classified elsewhere, unspecified severity, with agitation: Secondary | ICD-10-CM | POA: Diagnosis not present

## 2023-11-08 DIAGNOSIS — E860 Dehydration: Secondary | ICD-10-CM | POA: Diagnosis present

## 2023-11-08 DIAGNOSIS — F419 Anxiety disorder, unspecified: Secondary | ICD-10-CM | POA: Diagnosis not present

## 2023-11-08 DIAGNOSIS — E43 Unspecified severe protein-calorie malnutrition: Secondary | ICD-10-CM | POA: Diagnosis not present

## 2023-11-08 DIAGNOSIS — R4701 Aphasia: Secondary | ICD-10-CM | POA: Diagnosis present

## 2023-11-08 DIAGNOSIS — R278 Other lack of coordination: Secondary | ICD-10-CM | POA: Diagnosis not present

## 2023-11-08 DIAGNOSIS — I4891 Unspecified atrial fibrillation: Secondary | ICD-10-CM | POA: Diagnosis not present

## 2023-11-08 DIAGNOSIS — N3010 Interstitial cystitis (chronic) without hematuria: Secondary | ICD-10-CM | POA: Diagnosis present

## 2023-11-08 DIAGNOSIS — N3000 Acute cystitis without hematuria: Secondary | ICD-10-CM | POA: Diagnosis present

## 2023-11-08 DIAGNOSIS — Z515 Encounter for palliative care: Secondary | ICD-10-CM | POA: Diagnosis not present

## 2023-11-08 DIAGNOSIS — M25552 Pain in left hip: Secondary | ICD-10-CM | POA: Diagnosis present

## 2023-11-08 DIAGNOSIS — Z1152 Encounter for screening for COVID-19: Secondary | ICD-10-CM | POA: Diagnosis not present

## 2023-11-08 DIAGNOSIS — R296 Repeated falls: Secondary | ICD-10-CM | POA: Diagnosis not present

## 2023-11-08 DIAGNOSIS — Z6821 Body mass index (BMI) 21.0-21.9, adult: Secondary | ICD-10-CM | POA: Diagnosis not present

## 2023-11-08 DIAGNOSIS — E872 Acidosis, unspecified: Secondary | ICD-10-CM | POA: Diagnosis present

## 2023-11-08 DIAGNOSIS — Z7901 Long term (current) use of anticoagulants: Secondary | ICD-10-CM | POA: Diagnosis not present

## 2023-11-08 DIAGNOSIS — W19XXXA Unspecified fall, initial encounter: Secondary | ICD-10-CM | POA: Diagnosis present

## 2023-11-08 DIAGNOSIS — Z66 Do not resuscitate: Secondary | ICD-10-CM | POA: Diagnosis present

## 2023-11-08 DIAGNOSIS — I480 Paroxysmal atrial fibrillation: Secondary | ICD-10-CM | POA: Diagnosis present

## 2023-11-08 DIAGNOSIS — E785 Hyperlipidemia, unspecified: Secondary | ICD-10-CM | POA: Diagnosis not present

## 2023-11-08 DIAGNOSIS — D649 Anemia, unspecified: Secondary | ICD-10-CM | POA: Diagnosis present

## 2023-11-08 DIAGNOSIS — I10 Essential (primary) hypertension: Secondary | ICD-10-CM | POA: Diagnosis not present

## 2023-11-08 DIAGNOSIS — M8580 Other specified disorders of bone density and structure, unspecified site: Secondary | ICD-10-CM | POA: Diagnosis present

## 2023-11-08 DIAGNOSIS — Z7189 Other specified counseling: Secondary | ICD-10-CM | POA: Diagnosis not present

## 2023-11-08 DIAGNOSIS — F0390 Unspecified dementia without behavioral disturbance: Secondary | ICD-10-CM | POA: Diagnosis present

## 2023-11-08 DIAGNOSIS — M6281 Muscle weakness (generalized): Secondary | ICD-10-CM | POA: Diagnosis not present

## 2023-11-08 DIAGNOSIS — N200 Calculus of kidney: Secondary | ICD-10-CM | POA: Diagnosis present

## 2023-11-08 DIAGNOSIS — Z96642 Presence of left artificial hip joint: Secondary | ICD-10-CM | POA: Diagnosis present

## 2023-11-08 DIAGNOSIS — R2681 Unsteadiness on feet: Secondary | ICD-10-CM | POA: Diagnosis not present

## 2023-11-08 DIAGNOSIS — N390 Urinary tract infection, site not specified: Secondary | ICD-10-CM | POA: Diagnosis not present

## 2023-11-08 DIAGNOSIS — Z8619 Personal history of other infectious and parasitic diseases: Secondary | ICD-10-CM | POA: Diagnosis not present

## 2023-11-08 LAB — ECHOCARDIOGRAM COMPLETE
AR max vel: 2.29 cm2
AV Peak grad: 9.5 mmHg
Ao pk vel: 1.54 m/s
Area-P 1/2: 4.96 cm2
Height: 64 in
S' Lateral: 2.4 cm
Weight: 1989.43 [oz_av]

## 2023-11-08 LAB — LIPID PANEL
Cholesterol: 161 mg/dL (ref 0–200)
HDL: 73 mg/dL (ref >40)
LDL Cholesterol: 78 mg/dL (ref 0–99)
Total CHOL/HDL Ratio: 2.2 ratio
Triglycerides: 52 mg/dL (ref <150)
VLDL: 10 mg/dL (ref 0–40)

## 2023-11-08 LAB — HEMOGLOBIN A1C
Hgb A1c MFr Bld: 5.2 % (ref 4.8–5.6)
Mean Plasma Glucose: 102.54 mg/dL

## 2023-11-08 MED ORDER — ADULT MULTIVITAMIN W/MINERALS CH
1.0000 | ORAL_TABLET | Freq: Every day | ORAL | Status: DC
  Administered 2023-11-08 – 2023-11-15 (×8): 1 via ORAL
  Filled 2023-11-08 (×8): qty 1

## 2023-11-08 MED ORDER — METOPROLOL TARTRATE 12.5 MG HALF TABLET
12.5000 mg | ORAL_TABLET | Freq: Two times a day (BID) | ORAL | Status: DC
  Administered 2023-11-08 – 2023-11-15 (×14): 12.5 mg via ORAL
  Filled 2023-11-08 (×14): qty 1

## 2023-11-08 MED ORDER — BOOST / RESOURCE BREEZE PO LIQD CUSTOM
1.0000 | Freq: Three times a day (TID) | ORAL | Status: DC
  Administered 2023-11-08 – 2023-11-15 (×18): 1 via ORAL

## 2023-11-08 MED ORDER — LIDOCAINE 5 % EX PTCH
2.0000 | MEDICATED_PATCH | CUTANEOUS | Status: DC
  Administered 2023-11-08 – 2023-11-14 (×4): 2 via TRANSDERMAL
  Filled 2023-11-08 (×5): qty 2

## 2023-11-08 MED ORDER — LORAZEPAM 1 MG PO TABS
1.0000 mg | ORAL_TABLET | Freq: Two times a day (BID) | ORAL | Status: DC
  Administered 2023-11-08 – 2023-11-15 (×12): 1 mg via ORAL
  Filled 2023-11-08 (×16): qty 1

## 2023-11-08 MED ORDER — DICLOFENAC SODIUM 1 % EX GEL
2.0000 g | Freq: Four times a day (QID) | CUTANEOUS | Status: DC
  Administered 2023-11-08 – 2023-11-14 (×19): 2 g via TOPICAL
  Filled 2023-11-08: qty 100

## 2023-11-08 MED ORDER — VITAMIN D 25 MCG (1000 UNIT) PO TABS
1000.0000 [IU] | ORAL_TABLET | Freq: Every day | ORAL | Status: DC
  Administered 2023-11-08 – 2023-11-15 (×8): 1000 [IU] via ORAL
  Filled 2023-11-08 (×8): qty 1

## 2023-11-08 NOTE — Evaluation (Addendum)
 Occupational Therapy Evaluation Patient Details Name: Christine [name] MRN: [id number] DOB: 1933/10/12 Today's Date: 11/08/2023   History of Present Illness   Pt is an 89 y/o female who presents 11/07/2023 s/p fall at [facility] (memory care). Imaging negative for acute injury, and MRI of brain negative for acute intracranial changes. PMH signficant for TIA, HTN, dementia, osteopenia, L THA.     Clinical Impressions PTA Pt was independent with ADL tasks and functional mobility at memory care at [facility] per daughter report. Pt currently requires total A +2 for bed mobility to sit EOB and Max A for ADL tasks. Pt is primarily limited by impaired cognition, decreased activity tolerance 2/2 pain, impaired sitting balance, and generalized weakness. OT to continue to follow Pt acutely to facilitate progress towards goals. Patient will benefit from continued inpatient follow up therapy, <3 hours/day.      If plan is discharge home, recommend the following:   Two people to help with walking and/or transfers;A lot of help with bathing/dressing/bathroom;Assistance with cooking/housework;Assistance with feeding;Direct supervision/assist for medications management;Direct supervision/assist for financial management;Assist for transportation;Supervision due to cognitive status     Functional Status Assessment   Patient has had a recent decline in their functional status and demonstrates the ability to make significant improvements in function in a reasonable and predictable amount of time.     Equipment Recommendations   Other (comment) (defer to next LOC)     Recommendations for Other Services         Precautions/Restrictions   Precautions Precautions: Fall Recall of Precautions/Restrictions: Impaired Restrictions Weight Bearing Restrictions Per Provider Order: No     Mobility Bed Mobility Overal bed mobility: Needs Assistance Bed Mobility: Supine to Sit, Sit to  Supine     Supine to sit: Total assist, +2 for physical assistance, HOB elevated, +2 for safety/equipment Sit to supine: Total assist, +2 for physical assistance, +2 for safety/equipment   General bed mobility comments: Total assist for all aspects of bed mobility and repositioning in bed at end of session.    Transfers                   General transfer comment: Unable to progress OOB mobility this session d/t pain      Balance Overall balance assessment: Needs assistance Sitting-balance support: Single extremity supported, Bilateral upper extremity supported, Feet supported Sitting balance-Leahy Scale: Poor Sitting balance - Comments: Reliant on Max A to maintain sitting balance, observed to hold onto persons beside her.                                   ADL either performed or assessed with clinical judgement   ADL Overall ADL's : Needs assistance/impaired Eating/Feeding: Minimal assistance   Grooming: Wash/dry face;Moderate assistance;Sitting Grooming Details (indicate cue type and reason): provided with washcloth, required Mod verbal cues to wash face. Daughter completing task for Pt. Upper Body Bathing: Maximal assistance   Lower Body Bathing: Maximal assistance   Upper Body Dressing : Maximal assistance   Lower Body Dressing: Maximal assistance       Toileting- Clothing Manipulation and Hygiene: Total assistance         General ADL Comments: Pt limited by pain this session.     Vision Patient Visual Report: No change from baseline       Perception         Praxis  Pertinent Vitals/Pain Pain Assessment Pain Assessment: PAINAD Breathing: occasional labored breathing, short period of hyperventilation Negative Vocalization: occasional moan/groan, low speech, negative/disapproving quality Facial Expression: facial grimacing Body Language: tense, distressed pacing, fidgeting Consolability: unable to console, distract or  reassure PAINAD Score: 7 Pain Intervention(s): Limited activity within patient's tolerance, Monitored during session, Repositioned, Patient requesting pain meds-RN notified, Other (comment) (Pt complaining of pain in LE's and back (daughter reports kidney stones))     Extremity/Trunk Assessment Upper Extremity Assessment Upper Extremity Assessment: Generalized weakness (Pt guards BUE)   Lower Extremity Assessment Lower Extremity Assessment: Defer to PT evaluation RLE Deficits / Details: Minimal active movement noted in RLE. Painful to touch. Upon sitting EOB, extended knees out and unable to tolerate knees being bent to get feet flat on the floor. RLE: Unable to fully assess due to pain LLE Deficits / Details: Pt guarding LLE due to pain. Painful to touch and noted swelling in L knee and pt holding L LE adducted and internally rotated. Upon sitting EOB, extended knees out and unable to tolerate knees being bent to get feet flat on the floor. Daughter present and bent pt's L knee and once bent, pt able to maintain somewhat. LLE: Unable to fully assess due to pain   Cervical / Trunk Assessment Cervical / Trunk Assessment: Other exceptions Cervical / Trunk Exceptions: Forward head posture with rounded shoulders   Communication Communication Communication: Impaired Factors Affecting Communication: Reduced clarity of speech;Difficulty expressing self   Cognition Arousal: Alert Behavior During Therapy: Anxious Cognition: History of cognitive impairments             OT - Cognition Comments: baseline dementia                 Following commands: Impaired Following commands impaired: Follows one step commands inconsistently     Cueing  General Comments   Cueing Techniques: Verbal cues;Gestural cues;Tactile cues;Visual cues  Daughter, [name], at bedside is supportive and helpful with Pt. Pt frequently called out for daughter throughout session and relied on her to calm.    Exercises     Shoulder Instructions      Home Living Family/patient expects to be discharged to:: Assisted living                                 Additional Comments: At Memory Care at [facility]      Prior Functioning/Environment Prior Level of Function : Independent/Modified Independent             Mobility Comments: No AD per daughter ADLs Comments: Per daughter, staff assists sometimes but pt is typically able to get herself bathed and dressed without help.    OT Problem List: Decreased strength;Decreased activity tolerance;Decreased range of motion;Impaired balance (sitting and/or standing);Decreased cognition;Decreased safety awareness;Decreased knowledge of use of DME or AE;Pain   OT Treatment/Interventions: Self-care/ADL training;Therapeutic exercise;Energy conservation;DME and/or AE instruction;Therapeutic activities;Cognitive remediation/compensation;Patient/family education;Balance training      OT Goals(Current goals can be found in the care plan section)   Acute Rehab OT Goals Patient Stated Goal: none stated this session OT Goal Formulation: Patient unable to participate in goal setting Time For Goal Achievement: 11/22/23 Potential to Achieve Goals: Good ADL Goals Pt Will Perform Grooming: with min assist;sitting Pt Will Perform Upper Body Bathing: sitting;with contact guard assist Pt Will Perform Lower Body Bathing: with min assist;sitting/lateral leans Pt Will Perform Upper Body Dressing: with contact guard assist;sitting  Pt Will Perform Lower Body Dressing: with min assist;sitting/lateral leans Additional ADL Goal #1: Pt will engage in bed mobility with Mod A to sit EOB as a precursor to ADL engagement   OT Frequency:  Min 1X/week    Co-evaluation PT/OT/SLP Co-Evaluation/Treatment: Yes Reason for Co-Treatment: Complexity of the patient's impairments (multi-system involvement);Necessary to address cognition/behavior during  functional activity;For patient/therapist safety;To address functional/ADL transfers PT goals addressed during session: Mobility/safety with mobility;Balance;Strengthening/ROM OT goals addressed during session: ADL's and self-care;Strengthening/ROM      AM-PAC OT 6 Clicks Daily Activity     Outcome Measure Help from another person eating meals?: A Little Help from another person taking care of personal grooming?: A Lot Help from another person toileting, which includes using toliet, bedpan, or urinal?: Total Help from another person bathing (including washing, rinsing, drying)?: A Lot Help from another person to put on and taking off regular upper body clothing?: A Lot Help from another person to put on and taking off regular lower body clothing?: A Lot 6 Click Score: 12   End of Session Nurse Communication: Mobility status  Activity Tolerance: Patient limited by pain Patient left: in bed;with call bell/phone within reach;with bed alarm set;with family/visitor present  OT Visit Diagnosis: Muscle weakness (generalized) (M62.81);History of falling (Z91.81);Other symptoms and signs involving cognitive function;Pain Pain - Right/Left: Left Pain - part of body: Leg                Time: 1040-1110 OT Time Calculation (min): 30 min Charges:  OT General Charges $OT Visit: 1 Visit OT Evaluation $OT Eval Moderate Complexity: 1 Mod  Maurilio CROME, OTR/L.  [facility]  Office: [phone]   Maurilio PARAS Journi Moffa 11/08/2023, 1:02 PM

## 2023-11-08 NOTE — Progress Notes (Signed)
 Echocardiogram 2D Echocardiogram has been performed.  Christine [name] 11/08/2023, 10:08 AM

## 2023-11-08 NOTE — Progress Notes (Signed)
 Tele called and notified RN that pt was HR was up in the 140s-170 RN checked in pt she was in the bed eating daughter at bedside. She is currently sustaining 117s-120s. MD messaged and made aware

## 2023-11-08 NOTE — Progress Notes (Signed)
 PROGRESS NOTE    Christine [name]  FMW:[id number] DOB: May 08, 1933 DOA: 11/07/2023 PCP: Shayne Anes, MD    Brief Narrative:   Christine [name] is a 89 y.o. female OA, GERD, HLD, HTN, IDA, PAF dementia who presents to ED concern for unwitnessed fall.  Patient with a history of dementia.  History is limited.  EMS stating that SNF staff walked into patient's room and she was sitting on the ground and complaining of left leg/hip pain.    Assessment and Plan: Falls: -Suspect related to UTI - PT OT eval - From memory care unit at the facility [facility] consulted)   Aphasia: -Resolved - MRI without stroke but does show severe white matter changes/brain shrinkage     UTI: - Rocephin .   - Urine culture pending    Dementia: Continue patient on Namenda , Aricept , quetiapine dose decreased to 12.5 to avoid excessive sedation, Depakote continued.   DVT prophylaxis:  Rivaroxaban  (XARELTO ) tablet 15 mg    Code Status: Limited: Do not attempt resuscitation (DNR) -DNR-LIMITED -Do Not Intubate/DNI  Family Communication: At bedside  Disposition Plan:  Level of care: Med-Surg Status is: Inpatient     Consultants:  none   Subjective: Talking but not making sense  Objective: Vitals:   11/07/23 2032 11/08/23 0219 11/08/23 0419 11/08/23 1026  BP: 136/85 123/73 (!) 148/88 (!) 134/91  Pulse: 79 62 (!) 105 66  Resp: 18 17 18 16   Temp: 98.9 F (37.2 C) 98.4 F (36.9 C) 98.6 F (37 C) 97.7 F (36.5 C)  TempSrc: Oral Oral Oral Oral  SpO2: 95% 97% 98% 92%  Weight:      Height:        Intake/Output Summary (Last 24 hours) at 11/08/2023 1157 Last data filed at 11/08/2023 9682 Gross per 24 hour  Intake 60 ml  Output 1 ml  Net 59 ml   Filed Weights   11/07/23 1033  Weight: 56.4 kg    Examination:   General: Appearance:    Well developed, well nourished female in no acute distress     Lungs:     Clear to auscultation bilaterally, respirations unlabored  Heart:    Normal heart  rate.    MS:   All extremities are intact.    Neurologic:   Awake, alert, fully confused       Data Reviewed: I have personally reviewed following labs and imaging studies  CBC: Recent Labs  Lab 11/06/23 0937 11/07/23 1050  WBC 6.4 8.4  NEUTROABS 4.0 6.3  HGB 11.1* 11.1*  HCT 35.5* 34.7*  MCV 91.5 91.6  PLT 135* 160   Basic Metabolic Panel: Recent Labs  Lab 11/06/23 0937 11/07/23 1050  NA 140 141  K 4.3 4.3  CL 104 105  CO2 24 24  GLUCOSE 108* 125*  BUN 22 19  CREATININE 1.01* 1.13*  CALCIUM  9.5 9.0   GFR: Estimated Creatinine Clearance: 29.7 mL/min (A) (by C-G formula based on SCr of 1.13 mg/dL (H)). Liver Function Tests: Recent Labs  Lab 11/07/23 1050  AST 19  ALT 18  ALKPHOS 66  BILITOT 1.1  PROT 7.1  ALBUMIN 3.5   No results for input(s): LIPASE, AMYLASE in the last 168 hours. No results for input(s): AMMONIA in the last 168 hours. Coagulation Profile: Recent Labs  Lab 11/07/23 1050  INR 1.1   Cardiac Enzymes: No results for input(s): CKTOTAL, CKMB, CKMBINDEX, TROPONINI in the last 168 hours. BNP (last 3 results) No results for input(s): PROBNP in  the last 8760 hours. HbA1C: Recent Labs    11/08/23 0129  HGBA1C 5.2   CBG: No results for input(s): GLUCAP in the last 168 hours. Lipid Profile: Recent Labs    11/08/23 0129  CHOL 161  HDL 73  LDLCALC 78  TRIG 52  CHOLHDL 2.2   Thyroid  Function Tests: No results for input(s): TSH, T4TOTAL, FREET4, T3FREE, THYROIDAB in the last 72 hours. Anemia Panel: No results for input(s): VITAMINB12, FOLATE, FERRITIN, TIBC, IRON, RETICCTPCT in the last 72 hours. Sepsis Labs: Recent Labs  Lab 11/07/23 1112 11/07/23 1328  LATICACIDVEN 2.0* 0.9    Recent Results (from the past 240 hours)  Resp panel by RT-PCR (RSV, Flu A&B, Covid) Anterior Nasal Swab     Status: None   Collection Time: 11/07/23 10:50 AM   Specimen: Anterior Nasal Swab  Result Value  Ref Range Status   SARS Coronavirus 2 by RT PCR NEGATIVE NEGATIVE Final   Influenza A by PCR NEGATIVE NEGATIVE Final   Influenza B by PCR NEGATIVE NEGATIVE Final    Comment: (NOTE) The Xpert Xpress SARS-CoV-2/FLU/RSV plus assay is intended as an aid in the diagnosis of influenza from Nasopharyngeal swab specimens and should not be used as a sole basis for treatment. Nasal washings and aspirates are unacceptable for Xpert Xpress SARS-CoV-2/FLU/RSV testing.  Fact Sheet for Patients: bloggercourse.com  Fact Sheet for Healthcare Providers: seriousbroker.it  This test is not yet approved or cleared by the [place]  FDA and has been authorized for detection and/or diagnosis of SARS-CoV-2 by FDA under an Emergency Use Authorization (EUA). This EUA will remain in effect (meaning this test can be used) for the duration of the COVID-19 declaration under Section 564(b)(1) of the Act, 21 U.S.C. section 360bbb-3(b)(1), unless the authorization is terminated or revoked.     Resp Syncytial Virus by PCR NEGATIVE NEGATIVE Final    Comment: (NOTE) Fact Sheet for Patients: bloggercourse.com  Fact Sheet for Healthcare Providers: seriousbroker.it  This test is not yet approved or cleared by the [place]  FDA and has been authorized for detection and/or diagnosis of SARS-CoV-2 by FDA under an Emergency Use Authorization (EUA). This EUA will remain in effect (meaning this test can be used) for the duration of the COVID-19 declaration under Section 564(b)(1) of the Act, 21 U.S.C. section 360bbb-3(b)(1), unless the authorization is terminated or revoked.  Performed at [facility], [address], [place], [place] [address]   Blood Culture (routine x 2)     Status: None (Preliminary result)   Collection Time: 11/07/23 10:50 AM   Specimen: BLOOD  Result Value Ref Range Status    Specimen Description BLOOD LEFT ANTECUBITAL  Final   Special Requests   Final    BOTTLES DRAWN AEROBIC AND ANAEROBIC Blood Culture adequate volume   Culture   Final    NO GROWTH < 24 HOURS Performed at [facility], [address] [place], [place] [address]    Report Status PENDING  Incomplete  Blood Culture (routine x 2)     Status: None (Preliminary result)   Collection Time: 11/07/23 10:55 AM   Specimen: BLOOD RIGHT HAND  Result Value Ref Range Status   Specimen Description BLOOD RIGHT HAND  Final   Special Requests   Final    BOTTLES DRAWN AEROBIC ONLY Blood Culture results may not be optimal due to an inadequate volume of blood received in culture bottles   Culture   Final    NO GROWTH <  24 HOURS Performed at [facility], [address], [place], [place] [address]    Report Status PENDING  Incomplete         Radiology Studies: CT Hip Left Wo Contrast Result Date: 11/07/2023 CLINICAL DATA:  Hip trauma, fell EXAM: CT OF THE LEFT HIP WITHOUT CONTRAST TECHNIQUE: Multidetector CT imaging of the left hip was performed according to the standard protocol. Multiplanar CT image reconstructions were also generated. RADIATION DOSE REDUCTION: This exam was performed according to the departmental dose-optimization program which includes automated exposure control, adjustment of the mA and/or kV according to patient size and/or use of iterative reconstruction technique. COMPARISON:  11/07/2023, 11/06/2023 FINDINGS: Bones/Joint/Cartilage Stable appearance of the left hip arthroplasty. No evidence of metallic hardware failure or loosening. There are no acute displaced fractures. Stable spondylosis and facet hypertrophy at the lumbosacral junction. Ligaments Suboptimally assessed by CT. Muscles and Tendons No acute findings. Soft tissues No evidence of soft tissue swelling. Atherosclerosis of the left iliac and femoral vessels. Reconstructed images demonstrate no additional findings.  IMPRESSION: 1. Stable left hip arthroplasty. 2. No acute displaced fracture. Electronically Signed   By: Ozell Daring M.D.   On: 11/07/2023 19:43   MR BRAIN WO CONTRAST Result Date: 11/07/2023 EXAM: MRI BRAIN WITHOUT CONTRAST 11/07/2023 05:01:02 PM TECHNIQUE: Multiplanar multisequence MRI of the head/brain was performed without the administration of intravenous contrast. COMPARISON: Head CT 11/07/2023 and MRI 12/21/2015. CLINICAL HISTORY: aphasia FINDINGS: LIMITATIONS: The examination is mildly motion degraded. Axial T1 and coronal T2 sequences were not submitted. BRAIN AND VENTRICLES: There is no evidence of an acute infarct, intracranial hemorrhage, mass, midline shift, hydrocephalus, or extra-axial fluid collection. There is advanced cerebral atrophy. Patchy to confluent T2 hyperintensities in the cerebral white matter bilaterally have mildly progressed from the prior MRI and are nonspecific but compatible with moderate to severe chronic small vessel ischemic disease. Mild to moderate chronic small vessel changes are present in the pons. Major intracranial vascular flow voids are preserved. ORBITS: Bilateral cataract extraction. SINUSES AND MASTOIDS: Extensive bilateral ethmoid air cell opacification. Mild mucosal thickening in the other paranasal sinuses. Small left mastoid effusion. BONES AND SOFT TISSUES: Normal marrow signal. No acute soft tissue abnormality. IMPRESSION: 1. No acute intracranial abnormality. 2. Advanced cerebral atrophy and chronic small vessel ischemic disease. Electronically signed by: Dasie Hamburg MD 11/07/2023 05:33 PM EST RP Workstation: [id number]   DG Ankle Complete Right Result Date: 11/07/2023 CLINICAL DATA:  Un witnessed fall EXAM: RIGHT ANKLE - COMPLETE 3+ VIEW; DG FOOT COMPLETE 3+V*R* COMPARISON:  None Available. FINDINGS: Right ankle: Frontal, oblique, and lateral views are obtained. Evaluation slightly limited by patient positioning. No acute displaced fracture,  subluxation, or dislocation. Mild hindfoot osteoarthritis. Soft tissues are unremarkable. Right foot: Frontal, oblique, and lateral views are obtained. No acute displaced fracture. Moderate hallux valgus deformity with osteoarthritis at the first metatarsophalangeal joint. Mild midfoot osteoarthritis. Soft tissues are unremarkable. IMPRESSION: 1. No acute fracture of the right foot or ankle. 2. Multifocal osteoarthritis. 3. Hallux valgus deformity. Electronically Signed   By: Ozell Daring M.D.   On: 11/07/2023 16:30   DG Foot Complete Right Result Date: 11/07/2023 CLINICAL DATA:  Un witnessed fall EXAM: RIGHT ANKLE - COMPLETE 3+ VIEW; DG FOOT COMPLETE 3+V*R* COMPARISON:  None Available. FINDINGS: Right ankle: Frontal, oblique, and lateral views are obtained. Evaluation slightly limited by patient positioning. No acute displaced fracture, subluxation, or dislocation. Mild hindfoot osteoarthritis. Soft tissues are unremarkable. Right foot: Frontal, oblique, and lateral views are  obtained. No acute displaced fracture. Moderate hallux valgus deformity with osteoarthritis at the first metatarsophalangeal joint. Mild midfoot osteoarthritis. Soft tissues are unremarkable. IMPRESSION: 1. No acute fracture of the right foot or ankle. 2. Multifocal osteoarthritis. 3. Hallux valgus deformity. Electronically Signed   By: Ozell Daring M.D.   On: 11/07/2023 16:30   CT Cervical Spine Wo Contrast Result Date: 11/07/2023 CLINICAL DATA:  Clemens, history of it dimension EXAM: CT CERVICAL SPINE WITHOUT CONTRAST TECHNIQUE: Multidetector CT imaging of the cervical spine was performed without intravenous contrast. Multiplanar CT image reconstructions were also generated. RADIATION DOSE REDUCTION: This exam was performed according to the departmental dose-optimization program which includes automated exposure control, adjustment of the mA and/or kV according to patient size and/or use of iterative reconstruction technique.  COMPARISON:  07/14/2011 FINDINGS: Alignment: Reversal cervical lordosis with kyphosis centered at the C5-6 level, likely due to multilevel degenerative changes. Skull base and vertebrae: No acute fracture. No primary bone lesion or focal pathologic process. Soft tissues and spinal canal: No prevertebral fluid or swelling. No visible canal hematoma. Disc levels: Marked hypertrophic changes at the C1-C2 interface. Partial bony fusion across the disc space and bilateral facet joints at C2-3. Bony fusion across the bilateral facets at C4-5. There is multilevel spondylosis most pronounced at the C4-5, C5-6, and C6-7 levels. Diffuse multilevel facet hypertrophy greatest at C3-4. Upper chest: There is a spiculated subpleural 1.2 x 0.9 cm left upper lobe pulmonary nodule, concerning for underlying neoplasm. Airway is patent. Other: Reconstructed images demonstrate no additional findings. IMPRESSION: 1. No acute cervical spine fracture. 2. Suspicious left solid pulmonary nodule within the upper lobe measuring 12 mm detected on incomplete chest CT. Per Fleischner Society Guidelines, recommend prompt non-contrast Chest CT for further evaluation if the patient would be a therapy candidate should neoplasm be detected. Reference: Radiology. 2017; 284(1):228-43. 3. Extensive multilevel cervical degenerative changes as above. Electronically Signed   By: Ozell Daring M.D.   On: 11/07/2023 12:50   CT Head Wo Contrast Result Date: 11/07/2023 CLINICAL DATA:  Clemens, history of dementia EXAM: CT HEAD WITHOUT CONTRAST TECHNIQUE: Contiguous axial images were obtained from the base of the skull through the vertex without intravenous contrast. RADIATION DOSE REDUCTION: This exam was performed according to the departmental dose-optimization program which includes automated exposure control, adjustment of the mA and/or kV according to patient size and/or use of iterative reconstruction technique. COMPARISON:  09/15/2022 FINDINGS: Brain:  Stable chronic small-vessel ischemic changes throughout the periventricular white matter. No acute infarct or hemorrhage. Lateral ventricles and remaining midline structures are stable. No acute extra-axial fluid collections. No mass effect. Stable diffuse cerebral cortical atrophy. Vascular: No hyperdense vessel or unexpected calcification. Skull: Normal. Negative for fracture or focal lesion. Sinuses/Orbits: Mucosal thickening throughout the ethmoid air cells. No gas fluid levels. Trace left mastoid effusion. Other: None. IMPRESSION: 1. Stable head CT, no acute intracranial process. Electronically Signed   By: Ozell Daring M.D.   On: 11/07/2023 12:46   DG Knee 2 Views Left Result Date: 11/07/2023 EXAM: 1 or 2 VIEW(S) XRAY OF THE LEFT KNEE 11/07/2023 11:12:00 AM COMPARISON: None available. CLINICAL HISTORY: fall FINDINGS: BONES AND JOINTS: No acute fracture. No focal osseous lesion. No joint dislocation. Possible small suprapatellar joint effusion is noted. Severe narrowing of lateral joint space is noted. SOFT TISSUES: The soft tissues are unremarkable. IMPRESSION: 1. Severe lateral compartment joint space narrowing, consistent with advanced osteoarthritis. 2. Possible small suprapatellar joint effusion. Electronically signed by: Lynwood Seip MD 11/07/2023  11:20 AM EST RP Workstation: [id number]   DG Hip Unilat W or Wo Pelvis 2-3 Views Left Result Date: 11/07/2023 EXAM: 2 or 3 VIEW(S) XRAY OF THE LEFT HIP 11/07/2023 11:12:00 AM COMPARISON: 06/11/2016 status post left total hip arthroplasty. CLINICAL HISTORY: fall fall FINDINGS: BONES AND JOINTS: Status post left total hip arthroplasty. No acute fracture or focal osseous lesion. SOFT TISSUES: The soft tissues are unremarkable. IMPRESSION: 1. No acute fracture or dislocation. 2. Status post left total hip arthroplasty. Electronically signed by: Lynwood Seip MD 11/07/2023 11:19 AM EST RP Workstation: [id number]   DG Ankle Complete Left Result Date:  11/07/2023 EXAM: 3 OR MORE VIEWS XRAY OF THE LEFT ANKLE 11/07/2023 11:12:00 AM CLINICAL HISTORY: Fall. COMPARISON: None available. FINDINGS: BONES AND JOINTS: No acute fracture. No focal osseous lesion. No joint dislocation. SOFT TISSUES: The soft tissues are unremarkable. IMPRESSION: 1. No acute osseous abnormality. Electronically signed by: Lynwood Seip MD 11/07/2023 11:17 AM EST RP Workstation: [id number]   DG Chest Port 1 View Result Date: 11/07/2023 EXAM: 1 VIEW(S) XRAY OF THE CHEST 11/07/2023 11:12:00 AM COMPARISON: 01/29/2022 CLINICAL HISTORY: Questionable sepsis - evaluate for abnormality FINDINGS: LUNGS AND PLEURA: No focal pulmonary opacity. No pulmonary edema. No pleural effusion. No pneumothorax. HEART AND MEDIASTINUM: No acute abnormality of the cardiac and mediastinal silhouettes. BONES AND SOFT TISSUES: No acute osseous abnormality. IMPRESSION: 1. No acute cardiopulmonary disease. Electronically signed by: Lynwood Seip MD 11/07/2023 11:15 AM EST RP Workstation: [id number]        Scheduled Meds:   stroke: early stages of recovery book   Does not apply Once   cholecalciferol   1,000 Units Oral Daily   cyanocobalamin   1,000 mcg Oral Daily   divalproex  125 mg Oral Q12H   donepezil   10 mg Oral QHS   feeding supplement  1 Container Oral TID BM   memantine   5 mg Oral Daily   mirabegron  ER  50 mg Oral Daily   multivitamin with minerals  1 tablet Oral Daily   QUEtiapine  12.5 mg Oral QHS   Rivaroxaban   15 mg Oral Q supper   Continuous Infusions:  cefTRIAXone  (ROCEPHIN )  IV 1 g (11/08/23 1138)     LOS: 0 days    Time spent: 45 minutes spent on chart review, discussion with nursing staff, consultants, updating family and interview/physical exam; more than 50% of that time was spent in counseling and/or coordination of care.    Harlene RAYMOND Bowl, DO [facility] Available via Epic secure chat 7am-7pm After these hours, please refer to coverage provider listed on  [email or web address] 11/08/2023, 11:57 AM

## 2023-11-08 NOTE — Evaluation (Signed)
 Physical Therapy Evaluation  Patient Details Name: Christine [name] MRN: [id number] DOB: Jun 02, 1933 Today's Date: 11/08/2023  History of Present Illness  Pt is an 89 y/o female who presents 11/07/2023 s/p fall at [facility] (memory care). Imaging negative for acute injury, and MRI of brain negative for acute intracranial changes. PMH signficant for TIA, HTN, dementia, osteopenia, L THA.   Clinical Impression  Pt admitted with above diagnosis. Pt currently with functional limitations due to the deficits listed below (see PT Problem List). At the time of PT eval pt was significantly limited by pain. Pain reported to be worst in back (daughter reports kidney stones) and LLE. Noted LLE positioned in bed adducted and internally rotated. Heavy +2 assist required for transition to/from EOB and pt was unable to tolerate any further mobility this session due to pain. Pt unable to allow for knee flexion at EOB to get feet flat on the floor, however daughter able to bend L knee a little while pt sitting. Pt calling out in pain while sitting EOB. Repositioned in chair position in bed with LLE in neutral position on pillow, and UE's supported on pillows as well. Recommend continued skilled PT services at d/c <3 hours/day at d/c. Pt will benefit from acute skilled PT to increase their independence and safety with mobility to allow discharge.           If plan is discharge home, recommend the following: Two people to help with walking and/or transfers;Two people to help with bathing/dressing/bathroom;Assistance with cooking/housework;Direct supervision/assist for medications management;Direct supervision/assist for financial management;Assist for transportation;Supervision due to cognitive status;Help with stairs or ramp for entrance   Can travel by private vehicle   No    Equipment Recommendations Wheelchair (measurements PT);Wheelchair cushion (measurements PT);Hospital bed;Hoyer lift  Recommendations  for Other Services       Functional Status Assessment Patient has had a recent decline in their functional status and demonstrates the ability to make significant improvements in function in a reasonable and predictable amount of time.     Precautions / Restrictions Precautions Precautions: Fall Recall of Precautions/Restrictions: Impaired Restrictions Weight Bearing Restrictions Per Provider Order: No      Mobility  Bed Mobility Overal bed mobility: Needs Assistance Bed Mobility: Supine to Sit, Sit to Supine     Supine to sit: Total assist, +2 for physical assistance, HOB elevated, +2 for safety/equipment Sit to supine: Total assist, +2 for physical assistance, +2 for safety/equipment   General bed mobility comments: Total assist for all aspects of bed mobility and repositioning in bed at end of session.    Transfers                   General transfer comment: Unable to progress to OOB mobility this session    Ambulation/Gait                  Stairs            Wheelchair Mobility     Tilt Bed    Modified Rankin (Stroke Patients Only)       Balance Overall balance assessment: Needs assistance Sitting-balance support: Single extremity supported, Feet supported Sitting balance-Leahy Scale: Poor     Standing balance support:  (Unable to assess)                                 Pertinent Vitals/Pain Pain Assessment Pain Assessment: PAINAD Breathing:  occasional labored breathing, short period of hyperventilation Negative Vocalization: occasional moan/groan, low speech, negative/disapproving quality Facial Expression: facial grimacing Body Language: tense, distressed pacing, fidgeting Consolability: unable to console, distract or reassure PAINAD Score: 7 Pain Intervention(s): Limited activity within patient's tolerance, Monitored during session, Repositioned, Patient requesting pain meds-RN notified (Pt complaining of pain in  LE's and back (daughter reports kidney stones))    Home Living Family/patient expects to be discharged to:: Assisted living                   Additional Comments: At Memory Care at [facility]    Prior Function Prior Level of Function : Independent/Modified Independent             Mobility Comments: No AD per daughter ADLs Comments: Per daughter, staff assists sometimes but pt is typically able to get herself bathed and dressed without help.     Extremity/Trunk Assessment   Upper Extremity Assessment Upper Extremity Assessment: Defer to OT evaluation    Lower Extremity Assessment Lower Extremity Assessment: RLE deficits/detail;LLE deficits/detail RLE Deficits / Details: Minimal active movement noted in RLE. Painful to touch. Upon sitting EOB, extended knees out and unable to tolerate knees being bent to get feet flat on the floor. RLE: Unable to fully assess due to pain LLE Deficits / Details: Pt guarding LLE due to pain. Painful to touch and noted swelling in L knee and pt holding L LE adducted and internally rotated. Upon sitting EOB, extended knees out and unable to tolerate knees being bent to get feet flat on the floor. Daughter present and bent pt's L knee and once bent, pt able to maintain somewhat. LLE: Unable to fully assess due to pain    Cervical / Trunk Assessment Cervical / Trunk Assessment: Other exceptions Cervical / Trunk Exceptions: Forward head posture with rounded shoulders  Communication   Communication Communication: Impaired Factors Affecting Communication: Reduced clarity of speech;Difficulty expressing self    Cognition Arousal: Alert Behavior During Therapy: Anxious   PT - Cognitive impairments: History of cognitive impairments                         Following commands: Impaired Following commands impaired: Follows one step commands inconsistently     Cueing Cueing Techniques: Verbal cues, Gestural cues, Tactile cues,  Visual cues     General Comments      Exercises     Assessment/Plan    PT Assessment Patient needs continued PT services  PT Problem List Decreased strength;Decreased range of motion;Decreased activity tolerance;Decreased balance;Decreased mobility;Decreased knowledge of use of DME;Decreased safety awareness;Decreased knowledge of precautions;Pain       PT Treatment Interventions DME instruction;Gait training;Functional mobility training;Therapeutic activities;Therapeutic exercise;Balance training;Patient/family education    PT Goals (Current goals can be found in the Care Plan section)  Acute Rehab PT Goals Patient Stated Goal: Decrease pain PT Goal Formulation: With patient/family Time For Goal Achievement: 11/22/23 Potential to Achieve Goals: Fair    Frequency Min 2X/week     Co-evaluation PT/OT/SLP Co-Evaluation/Treatment: Yes Reason for Co-Treatment: Complexity of the patient's impairments (multi-system involvement);Necessary to address cognition/behavior during functional activity;For patient/therapist safety;To address functional/ADL transfers PT goals addressed during session: Mobility/safety with mobility;Balance;Strengthening/ROM         AM-PAC PT 6 Clicks Mobility  Outcome Measure Help needed turning from your back to your side while in a flat bed without using bedrails?: Total Help needed moving from lying on your back to sitting  on the side of a flat bed without using bedrails?: Total Help needed moving to and from a bed to a chair (including a wheelchair)?: Total Help needed standing up from a chair using your arms (e.g., wheelchair or bedside chair)?: Total Help needed to walk in hospital room?: Total Help needed climbing 3-5 steps with a railing? : Total 6 Click Score: 6    End of Session   Activity Tolerance: Patient limited by pain Patient left: in bed;with call bell/phone within reach;with bed alarm set;with family/visitor present Nurse  Communication: Mobility status;Patient requests pain meds PT Visit Diagnosis: Pain;Difficulty in walking, not elsewhere classified (R26.2);History of falling (Z91.81);Muscle weakness (generalized) (M62.81) Pain - Right/Left: Left Pain - part of body: Hip    Time: 1040-1110 PT Time Calculation (min) (ACUTE ONLY): 30 min   Charges:   PT Evaluation $PT Eval Moderate Complexity: 1 Mod   PT General Charges $$ ACUTE PT VISIT: 1 Visit         Leita Sable, PT, DPT Acute Rehabilitation Services Secure Chat Preferred Office: [phone]   Leita JONETTA Sable 11/08/2023, 12:36 PM

## 2023-11-08 NOTE — TOC Initial Note (Signed)
 Transition of Care [facility]) - Initial/Assessment Note    Patient Details  Name: Christine [name] MRN: [id number] Date of Birth: 01-14-1933  Transition of Care [facility]) CM/SW Contact:    Jeoffrey LITTIE Maranda ISRAEL Phone Number: 11/08/2023, 9:04 AM  Clinical Narrative:                 Pt admitted from [facility] due to unwitnessed fall. CSW following for needs.  Expected Discharge Plan: Assisted Living Barriers to Discharge: Continued Medical Work up   Patient Goals and CMS Choice Patient states their goals for this hospitalization and ongoing recovery are:: Return to [facility]          Expected Discharge Plan and Services       Living arrangements for the past 2 months: Assisted Living Facility                                      Prior Living Arrangements/Services Living arrangements for the past 2 months: Assisted Living Facility Lives with:: Facility Resident Patient language and need for interpreter reviewed:: Yes Do you feel safe going back to the place where you live?: Yes      Need for Family Participation in Patient Care: Yes (Comment) Care giver support system in place?: Yes (comment)   Criminal Activity/Legal Involvement Pertinent to Current Situation/Hospitalization: No - Comment as needed  Activities of Daily Living   ADL Screening (condition at time of admission) Independently performs ADLs?: No Does the patient have a NEW difficulty with bathing/dressing/toileting/self-feeding that is expected to last >3 days?: Yes (Initiates electronic notice to provider for possible OT consult) Does the patient have a NEW difficulty with getting in/out of bed, walking, or climbing stairs that is expected to last >3 days?: Yes (Initiates electronic notice to provider for possible PT consult) Does the patient have a NEW difficulty with communication that is expected to last >3 days?: No Is the patient deaf or have difficulty hearing?: Yes Does the patient have  difficulty seeing, even when wearing glasses/contacts?: No Does the patient have difficulty concentrating, remembering, or making decisions?: Yes  Permission Sought/Granted Permission sought to share information with : Facility Medical Sales Representative, Family Supports    Share Information with NAME: [name]  Permission granted to share info w AGENCY: [facility]  Permission granted to share info w Relationship: Daughter  Permission granted to share info w Contact Information: [phone]  Emotional Assessment Appearance:: Appears stated age Attitude/Demeanor/Rapport: Unable to Assess Affect (typically observed): Unable to Assess Orientation: : Oriented to Self Alcohol  / Substance Use: Not Applicable Psych Involvement: No (comment)  Admission diagnosis:  Fall [W19.XXXA] Acute cystitis without hematuria [N30.00] Fall, initial encounter [W19.XXXA] Patient Active Problem List   Diagnosis Date Noted   Syncope 01/30/2022   Syncope and collapse 01/30/2022   Malnutrition of moderate degree 03/15/2021   Vitamin B12 deficiency 03/14/2021   Dyslipidemia 03/14/2021   Recurrent falls 03/14/2021   NSTEMI (non-ST elevated myocardial infarction) (HCC) 03/13/2021   History of kidney stones 10/23/2020   Dementia without behavioral disturbance (HCC) 08/30/2020   Pain in right knee 08/30/2020   Vitamin D  deficiency 08/30/2020   Bilateral impacted cerumen 08/22/2019   Thrombophilia 03/02/2019   Increased frequency of urination 10/14/2017   Impacted cerumen 08/04/2016   Age-related osteoporosis without current pathological fracture 07/06/2016   Hip fracture (HCC) 05/29/2016   Closed left hip fracture (HCC) 05/27/2016  Insomnia 12/17/2015   Memory loss 12/17/2015   Dysuria 12/04/2015   AKI (acute kidney injury) 10/01/2015   Acute encephalopathy 10/01/2015   Interstitial cystitis 10/01/2015   Essential hypertension 10/01/2015   Rhabdomyolysis 09/30/2015   Major depression, single episode  07/10/2015   Non-thrombocytopenic purpura 07/10/2015   Encounter for general adult medical examination without abnormal findings 11/30/2014   Fall 08/07/2013   Pain in left leg 08/07/2013   Lower urinary tract infectious disease 07/16/2013   Paroxysmal atrial fibrillation (HCC) 07/16/2013   Chest pain 07/16/2013   Transient ischemic attack 07/04/2012   Chronic pain syndrome 03/28/2012   Residual hemorrhoidal skin tags 01/18/2012   Skin sensation disturbance 03/03/2010   Abdominal pain 02/03/2010   Pain in limb 10/07/2009   Anemia 03/26/2009   Allergic rhinitis 02/27/2009   Anxiety disorder 02/27/2009   Hyperlipidemia 02/27/2009   Low compliance bladder 02/27/2009   DYSPEPSIA 01/18/2008   ADENOMATOUS COLONIC POLYP 01/17/2008   HEMORRHOIDS, INTERNAL 01/17/2008   ESOPHAGITIS 01/17/2008   DIVERTICULOSIS, COLON 01/17/2008   HEMORRHOIDS-EXTERNAL 12/07/2007   GERD 12/07/2007   Constipation 12/07/2007   RECTAL BLEEDING 12/07/2007   Loss of weight 12/07/2007   Nausea alone 12/07/2007   HEARTBURN 12/07/2007   ABDOMINAL BLOATING 12/07/2007   History of colonic polyps 12/07/2007   PCP:  Shayne Anes, MD Pharmacy:   [id number] [place], [place] - [address] [place] [address] Phone: [phone] Fax: [phone]     Social Drivers of Health (SDOH) Social History: SDOH Screenings   Food Insecurity: No Food Insecurity (11/07/2023)  Housing: Low Risk  (11/07/2023)  Transportation Needs: No Transportation Needs (11/07/2023)  Utilities: Not At Risk (11/07/2023)  Tobacco Use: Low Risk  (09/16/2023)   SDOH Interventions:     Readmission Risk Interventions     No data to display

## 2023-11-08 NOTE — Plan of Care (Incomplete)
  Problem: Education: Goal: Knowledge of disease or condition will improve Outcome: Progressing   Problem: Nutrition: Goal: Risk of aspiration will decrease Outcome: Progressing   Problem: Clinical Measurements: Goal: Ability to maintain clinical measurements within normal limits will improve Outcome: Progressing   Problem: Nutrition: Goal: Adequate nutrition will be maintained Outcome: Progressing   Problem: Elimination: Goal: Will not experience complications related to bowel motility Outcome: Progressing   Problem: Pain Managment: Goal: General experience of comfort will improve and/or be controlled Outcome: Progressing   Problem: Safety: Goal: Ability to remain free from injury will improve 11/08/2023 0644 by Nyle Darice BRAVO, LPN Outcome: Progressing 11/08/2023 0436 by Nyle Darice BRAVO, LPN Outcome: Progressing   Problem: Skin Integrity: Goal: Risk for impaired skin integrity will decrease Outcome: Progressing

## 2023-11-08 NOTE — Progress Notes (Signed)
 Initial Nutrition Assessment  DOCUMENTATION CODES:   Severe malnutrition in context of acute illness/injury  INTERVENTION:  Encourage po intake - dysphagia 3, thin liquids Automatic trays Dicussed preordering meals with family  Nursing to assist with meals Boost Breeze po TID, each supplement provides 250 kcal and 9 grams of protein Magic cup TID with meals, each supplement provides 290 kcal and 9 grams of protein *Prefers strawberry  MVI with minerals daily 1,000 units Vitamin D  daily  Recommend updated weight   NUTRITION DIAGNOSIS:   Severe Malnutrition related to acute illness as evidenced by moderate muscle depletion, moderate fat depletion, energy intake < or equal to 50% for > or equal to 5 days, per patient/family report.  GOAL:   Patient will meet greater than or equal to 90% of their needs  MONITOR:   PO intake, Supplement acceptance, Labs, Weight trends  REASON FOR ASSESSMENT:   Consult Assessment of nutrition requirement/status  ASSESSMENT:   89 y.o. female with past medical history of CAD/NSTEMI, PAF, GERD, esophagitis, recurrent falls, AKI, HTN, nutritional deficiencies with vitamin D  and B12, dementia, recurrent UTIs. Admitted after unwitnessed fall and complaints of left leg/left hip pain.   11/2- Imaging of left ankle/knee/hip and head/cervical spine showed no abnormalities. MRI MRI without stroke but does show severe white matter changes/brain shrinkage   Patient with a history of dementia. History is limited. Daughter at bedside provides all history.   Pt from memory care unit at [facility], was seen at [facility] long yesterday and diagnosed with kidney stones. Having increased congestion over the past couple of days. Does had a mild chronic cough at baseline. No recent Nausea, vomiting, or abdominal pain. Daughter repots pt had a great appetite PTA and would typically clean her plate. Has seen a slight decrease in her appetite wihtin the last week eating 25-50% of  her meals. Had 100% orange juice and sausage, bites of toast and potatoes for breakfast. No recent weight loss noted. UBW 120-130 lbs. Pt was ambulatory without a walker PTA.   Daughter reports she has tried to get pt to drink Ensures and Boost but pt refuses. She is willing to try Boost Breeze and Magic cups. Pt was eating soft foods at SNF and able to feed herself however with her recent decline pt now requires feeding assistance continue DYS3 diet. Discussed preordering melas with daughter, will plan on automatic trays for now.   Admit weight: 56.4 kg Current weight: 56.4 kg  UBW: 120-30 lbs     Wt Readings from Last 10 Encounters:  11/07/23 56.4 kg  06/22/23 56.4 kg  01/29/22 56.4 kg  07/21/21 56.4 kg  03/13/21 63.5 kg  06/04/16 67.1 kg  05/29/16 67.1 kg  08/12/15 61.2 kg  09/17/14 61.2 kg  03/22/14 63.3 kg   Average Meal Intake: No meals recorded   Nutritionally Relevant Medications: Scheduled Meds:  cholecalciferol   1,000 Units Oral Daily   cyanocobalamin   1,000 mcg Oral Daily   feeding supplement  1 Container Oral TID BM   multivitamin with minerals  1 tablet Oral Daily   Continuous Infusions:  cefTRIAXone  (ROCEPHIN )  IV 1 g (11/08/23 1138)   Labs Reviewed: Creatinine 1.13 GFR 47 CBG ranges from 108-125 mg/dL over the last 24 hours HgbA1c 5.2   NUTRITION - FOCUSED PHYSICAL EXAM:  Flowsheet Row Most Recent Value  Orbital Region Moderate depletion  Upper Arm Region Moderate depletion  Thoracic and Lumbar Region Moderate depletion  Buccal Region Moderate depletion  Temple Region Moderate depletion  Clavicle Bone Region Moderate depletion  Clavicle and Acromion Bone Region Moderate depletion  Scapular Bone Region Mild depletion  Dorsal Hand Mild depletion  Patellar Region Mild depletion  Anterior Thigh Region Mild depletion  Posterior Calf Region Mild depletion  Edema (RD Assessment) None  Hair Reviewed  Eyes Reviewed  Mouth Unable to assess  Skin  Reviewed  Nails Reviewed    Diet Order:   Diet Order             DIET DYS 3 Room service appropriate? No; Fluid consistency: Thin  Diet effective now                   EDUCATION NEEDS:   Education needs have been addressed  Skin:  Skin Assessment: Reviewed RN Assessment  Last BM:  11/02 - type 3  Height:   Ht Readings from Last 1 Encounters:  11/07/23 5' 4 (1.626 m)    Weight:   Wt Readings from Last 1 Encounters:  11/07/23 56.4 kg    Ideal Body Weight:  54.5 kg  BMI:  Body mass index is 21.34 kg/m.  Estimated Nutritional Needs:   Kcal:  1400-1600 kcal  Protein:  70-90 gm  Fluid:  >1.4L/day   Olivia Kenning, RD Registered Dietitian  See Amion for more information

## 2023-11-08 NOTE — Plan of Care (Signed)
  Problem: Clinical Measurements: Goal: Ability to maintain clinical measurements within normal limits will improve Outcome: Progressing   Problem: Pain Managment: Goal: General experience of comfort will improve and/or be controlled Outcome: Progressing   Problem: Safety: Goal: Ability to remain free from injury will improve Outcome: Progressing

## 2023-11-09 DIAGNOSIS — W19XXXA Unspecified fall, initial encounter: Secondary | ICD-10-CM | POA: Diagnosis not present

## 2023-11-09 DIAGNOSIS — Z515 Encounter for palliative care: Secondary | ICD-10-CM | POA: Diagnosis not present

## 2023-11-09 DIAGNOSIS — N3000 Acute cystitis without hematuria: Secondary | ICD-10-CM | POA: Diagnosis not present

## 2023-11-09 DIAGNOSIS — Z7189 Other specified counseling: Secondary | ICD-10-CM | POA: Diagnosis not present

## 2023-11-09 DIAGNOSIS — F0390 Unspecified dementia without behavioral disturbance: Secondary | ICD-10-CM | POA: Diagnosis not present

## 2023-11-09 LAB — URINE CULTURE

## 2023-11-09 LAB — BASIC METABOLIC PANEL WITH GFR
Anion gap: 15 (ref 5–15)
BUN: 23 mg/dL (ref 8–23)
CO2: 23 mmol/L (ref 22–32)
Calcium: 8.8 mg/dL — ABNORMAL LOW (ref 8.9–10.3)
Chloride: 102 mmol/L (ref 98–111)
Creatinine, Ser: 1.17 mg/dL — ABNORMAL HIGH (ref 0.44–1.00)
GFR, Estimated: 45 mL/min — ABNORMAL LOW (ref >60)
Glucose, Bld: 123 mg/dL — ABNORMAL HIGH (ref 70–99)
Potassium: 4.2 mmol/L (ref 3.5–5.1)
Sodium: 140 mmol/L (ref 135–145)

## 2023-11-09 LAB — CBC
HCT: 32.6 % — ABNORMAL LOW (ref 36.0–46.0)
Hemoglobin: 10.7 g/dL — ABNORMAL LOW (ref 12.0–15.0)
MCH: 29.2 pg (ref 26.0–34.0)
MCHC: 32.8 g/dL (ref 30.0–36.0)
MCV: 88.8 fL (ref 80.0–100.0)
Platelets: 142 K/uL — ABNORMAL LOW (ref 150–400)
RBC: 3.67 MIL/uL — ABNORMAL LOW (ref 3.87–5.11)
RDW: 15 % (ref 11.5–15.5)
WBC: 8.5 K/uL (ref 4.0–10.5)
nRBC: 0 % (ref 0.0–0.2)

## 2023-11-09 NOTE — Plan of Care (Signed)

## 2023-11-09 NOTE — Progress Notes (Signed)
 PROGRESS NOTE    Christine [name]  FMW:[id number] DOB: 05/08/1933 DOA: 11/07/2023 PCP: Shayne Anes, MD    Brief Narrative:   Christine [name] is a 89 y.o. female OA, GERD, HLD, HTN, IDA, PAF dementia who presents to ED concern for unwitnessed fall.  Patient with a history of dementia.  History is limited.  EMS stating that SNF staff walked into patient's room and she was sitting on the ground and complaining of left leg/hip pain.  Found to have a UTI and severe arthritis.  Currently PT recommending skilled nursing facility.  Palliative care consult for goals of care with family's blessing   Assessment and Plan: Falls: -Suspect related to UTI - PT OT eval-SNF - From memory care unit at the facility [facility] consulted)   Aphasia: -Resolved - MRI without stroke but does show severe white matter changes/brain shrinkage     UTI: - Rocephin .   - Urine culture pending    Dementia: Continue patient on Namenda , Aricept , quetiapine dose decreased to 12.5 to avoid excessive sedation, Depakote continued.  Arthritis - Lidocaine  patch and Voltaren gel  DVT prophylaxis:  Rivaroxaban  (XARELTO ) tablet 15 mg    Code Status: Limited: Do not attempt resuscitation (DNR) -DNR-LIMITED -Do Not Intubate/DNI  Family Communication: At bedside  Disposition Plan:  Level of care: Med-Surg Status is: Inpatient  Suspected patient can return to memory care she will do better than skilled nursing but unsure if she will progress to that level  Consultants:  Palliative care   Subjective: Currently sleeping but family says she has been awake this morning and had breakfast already  Objective: Vitals:   11/08/23 1754 11/08/23 2002 11/09/23 0629 11/09/23 0938  BP: 117/68 122/79 128/74 (!) 143/57  Pulse: 90 79 81 76  Resp:  18 18 14   Temp: 97.7 F (36.5 C) 98.7 F (37.1 C) 98.3 F (36.8 C) 98.3 F (36.8 C)  TempSrc: Oral Axillary Axillary   SpO2: 98% 97% 100% 97%  Weight:      Height:         Intake/Output Summary (Last 24 hours) at 11/09/2023 1223 Last data filed at 11/09/2023 0900 Gross per 24 hour  Intake 240 ml  Output 201 ml  Net 39 ml   Filed Weights   11/07/23 1033  Weight: 56.4 kg    Examination:   General: Appearance:  Moderately/chronically ill female in no acute distress     Lungs:     respirations unlabored  Heart:    Normal heart rate.    MS:   All extremities are intact.    Neurologic: Will awaken to voice       Data Reviewed: I have personally reviewed following labs and imaging studies  CBC: Recent Labs  Lab 11/06/23 0937 11/07/23 1050 11/09/23 0531  WBC 6.4 8.4 8.5  NEUTROABS 4.0 6.3  --   HGB 11.1* 11.1* 10.7*  HCT 35.5* 34.7* 32.6*  MCV 91.5 91.6 88.8  PLT 135* 160 142*   Basic Metabolic Panel: Recent Labs  Lab 11/06/23 0937 11/07/23 1050 11/09/23 0531  NA 140 141 140  K 4.3 4.3 4.2  CL 104 105 102  CO2 24 24 23   GLUCOSE 108* 125* 123*  BUN 22 19 23   CREATININE 1.01* 1.13* 1.17*  CALCIUM  9.5 9.0 8.8*   GFR: Estimated Creatinine Clearance: 28.7 mL/min (A) (by C-G formula based on SCr of 1.17 mg/dL (H)). Liver Function Tests: Recent Labs  Lab 11/07/23 1050  AST 19  ALT 18  ALKPHOS 66  BILITOT 1.1  PROT 7.1  ALBUMIN 3.5   No results for input(s): LIPASE, AMYLASE in the last 168 hours. No results for input(s): AMMONIA in the last 168 hours. Coagulation Profile: Recent Labs  Lab 11/07/23 1050  INR 1.1   Cardiac Enzymes: No results for input(s): CKTOTAL, CKMB, CKMBINDEX, TROPONINI in the last 168 hours. BNP (last 3 results) No results for input(s): PROBNP in the last 8760 hours. HbA1C: Recent Labs    11/08/23 0129  HGBA1C 5.2   CBG: No results for input(s): GLUCAP in the last 168 hours. Lipid Profile: Recent Labs    11/08/23 0129  CHOL 161  HDL 73  LDLCALC 78  TRIG 52  CHOLHDL 2.2   Thyroid  Function Tests: No results for input(s): TSH, T4TOTAL, FREET4, T3FREE,  THYROIDAB in the last 72 hours. Anemia Panel: No results for input(s): VITAMINB12, FOLATE, FERRITIN, TIBC, IRON, RETICCTPCT in the last 72 hours. Sepsis Labs: Recent Labs  Lab 11/07/23 1112 11/07/23 1328  LATICACIDVEN 2.0* 0.9    Recent Results (from the past 240 hours)  Resp panel by RT-PCR (RSV, Flu A&B, Covid) Anterior Nasal Swab     Status: None   Collection Time: 11/07/23 10:50 AM   Specimen: Anterior Nasal Swab  Result Value Ref Range Status   SARS Coronavirus 2 by RT PCR NEGATIVE NEGATIVE Final   Influenza A by PCR NEGATIVE NEGATIVE Final   Influenza B by PCR NEGATIVE NEGATIVE Final    Comment: (NOTE) The Xpert Xpress SARS-CoV-2/FLU/RSV plus assay is intended as an aid in the diagnosis of influenza from Nasopharyngeal swab specimens and should not be used as a sole basis for treatment. Nasal washings and aspirates are unacceptable for Xpert Xpress SARS-CoV-2/FLU/RSV testing.  Fact Sheet for Patients: bloggercourse.com  Fact Sheet for Healthcare Providers: seriousbroker.it  This test is not yet approved or cleared by the [place]  FDA and has been authorized for detection and/or diagnosis of SARS-CoV-2 by FDA under an Emergency Use Authorization (EUA). This EUA will remain in effect (meaning this test can be used) for the duration of the COVID-19 declaration under Section 564(b)(1) of the Act, 21 U.S.C. section 360bbb-3(b)(1), unless the authorization is terminated or revoked.     Resp Syncytial Virus by PCR NEGATIVE NEGATIVE Final    Comment: (NOTE) Fact Sheet for Patients: bloggercourse.com  Fact Sheet for Healthcare Providers: seriousbroker.it  This test is not yet approved or cleared by the [place]  FDA and has been authorized for detection and/or diagnosis of SARS-CoV-2 by FDA under an Emergency Use Authorization (EUA). This EUA will  remain in effect (meaning this test can be used) for the duration of the COVID-19 declaration under Section 564(b)(1) of the Act, 21 U.S.C. section 360bbb-3(b)(1), unless the authorization is terminated or revoked.  Performed at [facility], [address], [place], [place] [address]   Blood Culture (routine x 2)     Status: None (Preliminary result)   Collection Time: 11/07/23 10:50 AM   Specimen: BLOOD  Result Value Ref Range Status   Specimen Description BLOOD LEFT ANTECUBITAL  Final   Special Requests   Final    BOTTLES DRAWN AEROBIC AND ANAEROBIC Blood Culture adequate volume   Culture   Final    NO GROWTH 2 DAYS Performed at [facility], [address], [place], [place] [address]    Report Status PENDING  Incomplete  Blood Culture (routine x 2)     Status: None (Preliminary result)  Collection Time: 11/07/23 10:55 AM   Specimen: BLOOD RIGHT HAND  Result Value Ref Range Status   Specimen Description BLOOD RIGHT HAND  Final   Special Requests   Final    BOTTLES DRAWN AEROBIC ONLY Blood Culture results may not be optimal due to an inadequate volume of blood received in culture bottles   Culture   Final    NO GROWTH 2 DAYS Performed at [facility], [address], [place], [place] [address]    Report Status PENDING  Incomplete  Urine Culture (for pregnant, neutropenic or urologic patients or patients with an indwelling urinary catheter)     Status: Abnormal   Collection Time: 11/08/23  1:51 PM   Specimen: Urine, Clean Catch  Result Value Ref Range Status   Specimen Description URINE, CLEAN CATCH  Final   Special Requests   Final    NONE Performed at [facility], [address], [place], [place] [address]    Culture MULTIPLE SPECIES PRESENT, SUGGEST RECOLLECTION (A)  Final   Report Status 11/09/2023 FINAL  Final         Radiology Studies: ECHOCARDIOGRAM COMPLETE Result Date: 11/08/2023    ECHOCARDIOGRAM REPORT   Patient Name:   Christine [name] Date of Exam: 11/08/2023 Medical Rec #:  [id number]       Height:       64.0 in Accession #:    [id number]      Weight:       124.3 lb Date of Birth:  1933/03/25       BSA:          1.599 m Patient Age:    89 years        BP:           148/88 mmHg Patient Gender: F               HR:           116 bpm. Exam Location:  Inpatient Procedure: 2D Echo, Cardiac Doppler and Color Doppler (Both Spectral and Color            Flow Doppler were utilized during procedure). Indications:    Atrial Fibrillation I48.91, Stroke I63.9  History:        Patient has prior history of Echocardiogram examinations, most                 recent 01/30/2022. Previous Myocardial Infarction, TIA,                 Arrythmias:Atrial Fibrillation, Signs/Symptoms:Chest Pain and                 Syncope; Risk Factors:Hypertension and Dyslipidemia.  Sonographer:    Thea Norlander RCS Referring Phys: MARIO GAILS PATEL IMPRESSIONS  1. Left ventricular ejection fraction, by estimation, is 55 to 60%. The left ventricle has normal function. The left ventricle has no regional wall motion abnormalities. Indeterminate diastolic filling due to E-A fusion.  2. Right ventricular systolic function is normal. The right ventricular size is normal.  3. The mitral valve is normal in structure. Mild to moderate mitral valve regurgitation. No evidence of mitral stenosis.  4. The aortic valve is normal in structure. There is moderate calcification of the aortic valve. There is mild thickening of the aortic valve. Aortic valve regurgitation is trivial. No aortic stenosis is present.  5. The inferior vena cava is dilated in size with <50% respiratory variability, suggesting right atrial pressure of  15 mmHg. FINDINGS  Left Ventricle: Left ventricular ejection fraction, by estimation, is 55 to 60%. The left ventricle has normal function. The left ventricle has no regional wall motion abnormalities. The left ventricular internal cavity size was normal in size. There is  no  left ventricular hypertrophy. Indeterminate diastolic filling due to E-A fusion. Right Ventricle: The right ventricular size is normal. No increase in right ventricular wall thickness. Right ventricular systolic function is normal. Left Atrium: Left atrial size was normal in size. Right Atrium: Right atrial size was normal in size. Pericardium: There is no evidence of pericardial effusion. Mitral Valve: The mitral valve is normal in structure. Mild to moderate mitral valve regurgitation. No evidence of mitral valve stenosis. Tricuspid Valve: The tricuspid valve is normal in structure. Tricuspid valve regurgitation is mild . No evidence of tricuspid stenosis. Aortic Valve: The aortic valve is normal in structure. There is moderate calcification of the aortic valve. There is mild thickening of the aortic valve. Aortic valve regurgitation is trivial. No aortic stenosis is present. Aortic valve peak gradient measures 9.5 mmHg. Pulmonic Valve: The pulmonic valve was normal in structure. Pulmonic valve regurgitation is trivial. No evidence of pulmonic stenosis. Aorta: The aortic root is normal in size and structure. Venous: The inferior vena cava is dilated in size with less than 50% respiratory variability, suggesting right atrial pressure of 15 mmHg. IAS/Shunts: No atrial level shunt detected by color flow Doppler.  LEFT VENTRICLE PLAX 2D LVIDd:         3.70 cm   Diastology LVIDs:         2.40 cm   LV e' medial:    9.46 cm/s LV PW:         1.10 cm   LV E/e' medial:  11.1 LV IVS:        0.90 cm   LV e' lateral:   18.40 cm/s LVOT diam:     1.90 cm   LV E/e' lateral: 5.7 LV SV:         50 LV SV Index:   32 LVOT Area:     2.84 cm  RIGHT VENTRICLE             IVC RV S prime:     30.00 cm/s  IVC diam: 2.20 cm TAPSE (M-mode): 1.5 cm LEFT ATRIUM             Index        RIGHT ATRIUM           Index LA diam:        2.90 cm 1.81 cm/m   RA Area:     12.00 cm LA Vol (A2C):   43.7 ml 27.34 ml/m  RA Volume:   23.40 ml  14.64  ml/m LA Vol (A4C):   27.9 ml 17.45 ml/m LA Biplane Vol: 34.6 ml 21.64 ml/m  AORTIC VALVE AV Area (Vmax): 2.29 cm AV Vmax:        154.00 cm/s AV Peak Grad:   9.5 mmHg LVOT Vmax:      124.33 cm/s LVOT Vmean:     80.767 cm/s LVOT VTI:       0.178 m  AORTA Ao Root diam: 3.30 cm Ao Asc diam:  3.30 cm MITRAL VALVE                TRICUSPID VALVE MV Area (PHT): 4.96 cm     TR Peak grad:   226576.0 mmHg MV Decel Time: 153 msec  TR Vmax:        23800.00 cm/s MV E velocity: 105.00 cm/s MV A velocity: 44.60 cm/s   SHUNTS MV E/A ratio:  2.35         Systemic VTI:  0.18 m                             Systemic Diam: 1.90 cm Joelle Cedars Tonleu Electronically signed by Joelle Cedars Tonleu Signature Date/Time: 11/08/2023/12:37:13 PM    Final    CT Hip Left Wo Contrast Result Date: 11/07/2023 CLINICAL DATA:  Hip trauma, fell EXAM: CT OF THE LEFT HIP WITHOUT CONTRAST TECHNIQUE: Multidetector CT imaging of the left hip was performed according to the standard protocol. Multiplanar CT image reconstructions were also generated. RADIATION DOSE REDUCTION: This exam was performed according to the departmental dose-optimization program which includes automated exposure control, adjustment of the mA and/or kV according to patient size and/or use of iterative reconstruction technique. COMPARISON:  11/07/2023, 11/06/2023 FINDINGS: Bones/Joint/Cartilage Stable appearance of the left hip arthroplasty. No evidence of metallic hardware failure or loosening. There are no acute displaced fractures. Stable spondylosis and facet hypertrophy at the lumbosacral junction. Ligaments Suboptimally assessed by CT. Muscles and Tendons No acute findings. Soft tissues No evidence of soft tissue swelling. Atherosclerosis of the left iliac and femoral vessels. Reconstructed images demonstrate no additional findings. IMPRESSION: 1. Stable left hip arthroplasty. 2. No acute displaced fracture. Electronically Signed   By: Ozell Daring M.D.   On:  11/07/2023 19:43   MR BRAIN WO CONTRAST Result Date: 11/07/2023 EXAM: MRI BRAIN WITHOUT CONTRAST 11/07/2023 05:01:02 PM TECHNIQUE: Multiplanar multisequence MRI of the head/brain was performed without the administration of intravenous contrast. COMPARISON: Head CT 11/07/2023 and MRI 12/21/2015. CLINICAL HISTORY: aphasia FINDINGS: LIMITATIONS: The examination is mildly motion degraded. Axial T1 and coronal T2 sequences were not submitted. BRAIN AND VENTRICLES: There is no evidence of an acute infarct, intracranial hemorrhage, mass, midline shift, hydrocephalus, or extra-axial fluid collection. There is advanced cerebral atrophy. Patchy to confluent T2 hyperintensities in the cerebral white matter bilaterally have mildly progressed from the prior MRI and are nonspecific but compatible with moderate to severe chronic small vessel ischemic disease. Mild to moderate chronic small vessel changes are present in the pons. Major intracranial vascular flow voids are preserved. ORBITS: Bilateral cataract extraction. SINUSES AND MASTOIDS: Extensive bilateral ethmoid air cell opacification. Mild mucosal thickening in the other paranasal sinuses. Small left mastoid effusion. BONES AND SOFT TISSUES: Normal marrow signal. No acute soft tissue abnormality. IMPRESSION: 1. No acute intracranial abnormality. 2. Advanced cerebral atrophy and chronic small vessel ischemic disease. Electronically signed by: Dasie Hamburg MD 11/07/2023 05:33 PM EST RP Workstation: [id number]   DG Ankle Complete Right Result Date: 11/07/2023 CLINICAL DATA:  Un witnessed fall EXAM: RIGHT ANKLE - COMPLETE 3+ VIEW; DG FOOT COMPLETE 3+V*R* COMPARISON:  None Available. FINDINGS: Right ankle: Frontal, oblique, and lateral views are obtained. Evaluation slightly limited by patient positioning. No acute displaced fracture, subluxation, or dislocation. Mild hindfoot osteoarthritis. Soft tissues are unremarkable. Right foot: Frontal, oblique, and lateral views  are obtained. No acute displaced fracture. Moderate hallux valgus deformity with osteoarthritis at the first metatarsophalangeal joint. Mild midfoot osteoarthritis. Soft tissues are unremarkable. IMPRESSION: 1. No acute fracture of the right foot or ankle. 2. Multifocal osteoarthritis. 3. Hallux valgus deformity. Electronically Signed   By: Ozell Daring M.D.   On: 11/07/2023 16:30   DG Foot Complete Right Result  Date: 11/07/2023 CLINICAL DATA:  Un witnessed fall EXAM: RIGHT ANKLE - COMPLETE 3+ VIEW; DG FOOT COMPLETE 3+V*R* COMPARISON:  None Available. FINDINGS: Right ankle: Frontal, oblique, and lateral views are obtained. Evaluation slightly limited by patient positioning. No acute displaced fracture, subluxation, or dislocation. Mild hindfoot osteoarthritis. Soft tissues are unremarkable. Right foot: Frontal, oblique, and lateral views are obtained. No acute displaced fracture. Moderate hallux valgus deformity with osteoarthritis at the first metatarsophalangeal joint. Mild midfoot osteoarthritis. Soft tissues are unremarkable. IMPRESSION: 1. No acute fracture of the right foot or ankle. 2. Multifocal osteoarthritis. 3. Hallux valgus deformity. Electronically Signed   By: Ozell Daring M.D.   On: 11/07/2023 16:30   CT Cervical Spine Wo Contrast Result Date: 11/07/2023 CLINICAL DATA:  Clemens, history of it dimension EXAM: CT CERVICAL SPINE WITHOUT CONTRAST TECHNIQUE: Multidetector CT imaging of the cervical spine was performed without intravenous contrast. Multiplanar CT image reconstructions were also generated. RADIATION DOSE REDUCTION: This exam was performed according to the departmental dose-optimization program which includes automated exposure control, adjustment of the mA and/or kV according to patient size and/or use of iterative reconstruction technique. COMPARISON:  07/14/2011 FINDINGS: Alignment: Reversal cervical lordosis with kyphosis centered at the C5-6 level, likely due to multilevel  degenerative changes. Skull base and vertebrae: No acute fracture. No primary bone lesion or focal pathologic process. Soft tissues and spinal canal: No prevertebral fluid or swelling. No visible canal hematoma. Disc levels: Marked hypertrophic changes at the C1-C2 interface. Partial bony fusion across the disc space and bilateral facet joints at C2-3. Bony fusion across the bilateral facets at C4-5. There is multilevel spondylosis most pronounced at the C4-5, C5-6, and C6-7 levels. Diffuse multilevel facet hypertrophy greatest at C3-4. Upper chest: There is a spiculated subpleural 1.2 x 0.9 cm left upper lobe pulmonary nodule, concerning for underlying neoplasm. Airway is patent. Other: Reconstructed images demonstrate no additional findings. IMPRESSION: 1. No acute cervical spine fracture. 2. Suspicious left solid pulmonary nodule within the upper lobe measuring 12 mm detected on incomplete chest CT. Per Fleischner Society Guidelines, recommend prompt non-contrast Chest CT for further evaluation if the patient would be a therapy candidate should neoplasm be detected. Reference: Radiology. 2017; 284(1):228-43. 3. Extensive multilevel cervical degenerative changes as above. Electronically Signed   By: Ozell Daring M.D.   On: 11/07/2023 12:50   CT Head Wo Contrast Result Date: 11/07/2023 CLINICAL DATA:  Clemens, history of dementia EXAM: CT HEAD WITHOUT CONTRAST TECHNIQUE: Contiguous axial images were obtained from the base of the skull through the vertex without intravenous contrast. RADIATION DOSE REDUCTION: This exam was performed according to the departmental dose-optimization program which includes automated exposure control, adjustment of the mA and/or kV according to patient size and/or use of iterative reconstruction technique. COMPARISON:  09/15/2022 FINDINGS: Brain: Stable chronic small-vessel ischemic changes throughout the periventricular white matter. No acute infarct or hemorrhage. Lateral ventricles  and remaining midline structures are stable. No acute extra-axial fluid collections. No mass effect. Stable diffuse cerebral cortical atrophy. Vascular: No hyperdense vessel or unexpected calcification. Skull: Normal. Negative for fracture or focal lesion. Sinuses/Orbits: Mucosal thickening throughout the ethmoid air cells. No gas fluid levels. Trace left mastoid effusion. Other: None. IMPRESSION: 1. Stable head CT, no acute intracranial process. Electronically Signed   By: Ozell Daring M.D.   On: 11/07/2023 12:46        Scheduled Meds:   stroke: early stages of recovery book   Does not apply Once   cholecalciferol   1,000 Units  Oral Daily   cyanocobalamin   1,000 mcg Oral Daily   diclofenac Sodium  2 g Topical QID   divalproex  125 mg Oral Q12H   donepezil   10 mg Oral QHS   feeding supplement  1 Container Oral TID BM   lidocaine   2 patch Transdermal Q24H   LORazepam   1 mg Oral BID   memantine   5 mg Oral Daily   metoprolol  tartrate  12.5 mg Oral BID   mirabegron  ER  50 mg Oral Daily   multivitamin with minerals  1 tablet Oral Daily   QUEtiapine  12.5 mg Oral QHS   Rivaroxaban   15 mg Oral Q supper   Continuous Infusions:  cefTRIAXone  (ROCEPHIN )  IV 1 g (11/08/23 1138)     LOS: 1 day    Time spent: 45 minutes spent on chart review, discussion with nursing staff, consultants, updating family and interview/physical exam; more than 50% of that time was spent in counseling and/or coordination of care.    Harlene RAYMOND Bowl, DO [facility] Available via Epic secure chat 7am-7pm After these hours, please refer to coverage provider listed on [email or web address] 11/09/2023, 12:23 PM

## 2023-11-09 NOTE — Consult Note (Signed)
 Consultation Note Date: 11/09/2023   Patient Name: Christine [name]  DOB: May 12, 1933  MRN: [id number]  Age / Sex: 89 y.o., female  PCP: Shayne Anes, MD Referring Physician: Juvenal Harlene PENNER, DO  Reason for Consultation: Establishing goals of care and Hospice Evaluation  HPI/Patient Profile: 89 y.o. female  with past medical history of CAD/NSTEMI, PAF on Xarelto , GERD esophagitis, recurrent falls, AKI, rhabdomyolysis, HTN, dementia, falls, UTI, syncope and collapse admitted on 11/07/2023 with suspected fall secondary to UTI.   Clinical Assessment and Goals of Care: Consult received and chart review completed. I met today at Christine [name]'s bedside along with daughter, Christine [name]. Ms. Burggraf slept while I spoke with Christine [name]. She once briefly opened her eyes and made eye contact but did not speak or interact with me. Christine [name] shares that her mother was functional and able to dress self, feed self, and walk around. She has had fluctuating appetite sometimes eating full meals and others only a few bites. She did have some incontinence. She has been in memory care ALF since September and was living at home prior with assistance from family who lives next door. She has had memory issues needing increasing assistance over the past 5 years. She drove up until 3 years ago. She is widowed and she had a son who died a few years ago. Christine [name] has support from her stepbrother (son of her father prior to his marriage to patient) and her children.   We spent time reviewing dementia progression and expectations. We discussed time for outcomes. We reviewed return to memory care with palliative vs hospice, SNF rehab, and hospice facility and when each would be appropriate. Christine [name] would be open to hospice options and guidance on recommendations for next steps. She would like to see how her mother does with PT and eating/drinking while hospitalized. She does  share that her mother has expressed that she is ready to go on multiple occasions. Christine [name] would accept if she were approaching end of life. Christine [name] did feel that her mother was still having good quality of life prior to hospitalization. She knows her mother would never want resuscitation, feeding tubes, or artificial prolonging of life. Time for outcomes.   All questions/concerns addressed. Emotional support provided.   Primary Decision Maker HCPOA daughter Christine [name]    SUMMARY OF RECOMMENDATIONS   - DNR - No feeding tube - Time for outcomes and progress to help determine next steps  Code Status/Advance Care Planning: DNR   Symptom Management:  Per attending.   Prognosis:  Prognosis poor.   Discharge Planning: To Be Determined      Primary Diagnoses: Present on Admission:  Fall   I have reviewed the medical record, interviewed the patient and family, and examined the patient. The following aspects are pertinent.  Past Medical History:  Diagnosis Date   AR (allergic rhinitis)    Arthritis    Bilateral carotid artery stenosis    MILD --  40% BILATERAL ICA PER DOPPLER JULY 2014   Diverticulosis    Frequency  of urination    GERD (gastroesophageal reflux disease)    History of colon polyps    ADENOMATOUS   History of Helicobacter pylori infection    History of shingles    History of TIA (transient ischemic attack)    Hyperlipidemia    Hypertension    Impaired memory    Interstitial cystitis    Iron deficiency    Nocturia    Osteopenia    PAF (paroxysmal atrial fibrillation) (HCC)    dx 07/ 2015   Pelvic pain in female    Urgency of urination    Vitamin D  deficiency    Wears glasses    Social History   Socioeconomic History   Marital status: Widowed    Spouse name: Not on file   Number of children: Not on file   Years of education: Not on file   Highest education level: Not on file  Occupational History   Not on file  Tobacco Use   Smoking status: Never    Smokeless tobacco: Never  Substance and Sexual Activity   Alcohol  use: No   Drug use: No   Sexual activity: Not on file  Other Topics Concern   Not on file  Social History Narrative   Not on file   Social Drivers of Health   Financial Resource Strain: Not on file  Food Insecurity: No Food Insecurity (11/07/2023)   Hunger Vital Sign    Worried About Running Out of Food in the Last Year: Never true    Ran Out of Food in the Last Year: Never true  Transportation Needs: No Transportation Needs (11/07/2023)   PRAPARE - Administrator, Civil Service (Medical): No    Lack of Transportation (Non-Medical): No  Physical Activity: Not on file  Stress: Not on file  Social Connections: Not on file   No family history on file. Scheduled Meds:   stroke: early stages of recovery book   Does not apply Once   cholecalciferol   1,000 Units Oral Daily   cyanocobalamin   1,000 mcg Oral Daily   diclofenac Sodium  2 g Topical QID   divalproex  125 mg Oral Q12H   donepezil   10 mg Oral QHS   feeding supplement  1 Container Oral TID BM   lidocaine   2 patch Transdermal Q24H   LORazepam   1 mg Oral BID   memantine   5 mg Oral Daily   metoprolol  tartrate  12.5 mg Oral BID   mirabegron  ER  50 mg Oral Daily   multivitamin with minerals  1 tablet Oral Daily   QUEtiapine  12.5 mg Oral QHS   Rivaroxaban   15 mg Oral Q supper   Continuous Infusions:  cefTRIAXone  (ROCEPHIN )  IV 1 g (11/09/23 1302)   PRN Meds:.acetaminophen  **OR** [DISCONTINUED] acetaminophen  (TYLENOL ) oral liquid 160 mg/5 mL **OR** acetaminophen  Allergies  Allergen Reactions   Ambien  [Zolpidem  Tartrate] Other (See Comments)     Hallucinations    Celexa [Citalopram] Other (See Comments)    Per patient, made the inside and outside of skin burn.   Cymbalta [Duloxetine Hcl] Other (See Comments)    Unknown reaction   Ditropan Xl [Oxybutynin Chloride] Nausea Only, Swelling and Other (See Comments)    Throat swelling Burning  eyelids   Lescol [Fluvastatin] Other (See Comments)    Myalgias    Lipitor [Atorvastatin] Other (See Comments)    Constipation  Hot flashes Left-leaning gait   Lyrica  [Pregabalin ] Other (See Comments)    Made patient  feel funny   Neurontin [Gabapentin] Other (See Comments)    Hallucinations    Pravastatin Other (See Comments)    Myalgias    Tape Other (See Comments)    Skin sensitive - tears and bruises easily   Zetia [Ezetimibe] Other (See Comments)    Myalgias    Zocor [Simvastatin] Other (See Comments)    Myalgias    Review of Systems  Unable to perform ROS: Dementia    Physical Exam Vitals and nursing note reviewed.  Constitutional:      General: She is sleeping.     Appearance: She is ill-appearing.     Comments: Frail   Cardiovascular:     Rate and Rhythm: Normal rate.  Pulmonary:     Effort: No tachypnea, accessory muscle usage or respiratory distress.  Abdominal:     General: Abdomen is flat.  Neurological:     Mental Status: She is disoriented and confused.     Vital Signs: BP (!) 143/57 (BP Location: Right Arm)   Pulse 76   Temp 98.3 F (36.8 C)   Resp 14   Ht 5' 4 (1.626 m)   Wt 56.4 kg   SpO2 97%   BMI 21.34 kg/m  Pain Scale: PAINAD   Pain Score: Asleep   SpO2: SpO2: 97 % O2 Device:SpO2: 97 % O2 Flow Rate: .   IO: Intake/output summary:  Intake/Output Summary (Last 24 hours) at 11/09/2023 1333 Last data filed at 11/09/2023 0900 Gross per 24 hour  Intake 240 ml  Output 201 ml  Net 39 ml    LBM: Last BM Date : 11/08/23 Baseline Weight: Weight: 56.4 kg Most recent weight: Weight: 56.4 kg     Palliative Assessment/Data:    Time Total: 80 min  Greater than 50%  of this time was spent counseling and coordinating care related to the above assessment and plan.  Signed by: Bernarda Kitty, NP Palliative Medicine Team Pager # [phone] (M-F 8a-5p) Team Phone # [phone] (Nights/Weekends)

## 2023-11-09 NOTE — Progress Notes (Signed)
 SLP Cancellation Note  Patient Details Name: Christine [name] MRN: [id number] DOB: [birth year]   Cancelled treatment:       Reason Eval/Treat Not Completed: Other (comment). SLP cognitive linguistic eval ordered on admission, MRI negative for acute finding, pt with baseline dementia in memory care. Cognitive assessment in acute setting not needed defer to next level of care   [name], Kristyl [place] 11/09/2023, 11:47 AM

## 2023-11-09 NOTE — TOC Progression Note (Addendum)
 Transition of Care [facility]) - Progression Note    Patient Details  Name: Christine [name] MRN: [id number] Date of Birth: 04-02-1933  Transition of Care [facility]) CM/SW Contact  Edras Wilford [name], [place] Phone Number: 11/09/2023, 9:14 AM  Clinical Narrative:    CSW contacted admissions director at [facility] to inquire about pt going to SNF before  returning at DC but did not receive a response.  CSW left a voicemail and provided contact information. CSW will continue to follow.   Expected Discharge Plan: Assisted Living Barriers to Discharge: Continued Medical Work up               Expected Discharge Plan and Services       Living arrangements for the past 2 months: Assisted Living Facility                                       Social Drivers of Health (SDOH) Interventions SDOH Screenings   Food Insecurity: No Food Insecurity (11/07/2023)  Housing: Low Risk  (11/07/2023)  Transportation Needs: No Transportation Needs (11/07/2023)  Utilities: Not At Risk (11/07/2023)  Tobacco Use: Low Risk  (09/16/2023)    Readmission Risk Interventions     No data to display

## 2023-11-10 ENCOUNTER — Encounter (HOSPITAL_COMMUNITY): Payer: Self-pay | Admitting: Internal Medicine

## 2023-11-10 DIAGNOSIS — Z7189 Other specified counseling: Secondary | ICD-10-CM

## 2023-11-10 DIAGNOSIS — F0390 Unspecified dementia without behavioral disturbance: Secondary | ICD-10-CM | POA: Diagnosis not present

## 2023-11-10 DIAGNOSIS — Z515 Encounter for palliative care: Secondary | ICD-10-CM

## 2023-11-10 DIAGNOSIS — W19XXXA Unspecified fall, initial encounter: Secondary | ICD-10-CM | POA: Diagnosis not present

## 2023-11-10 LAB — CBC
HCT: 32.1 % — ABNORMAL LOW (ref 36.0–46.0)
Hemoglobin: 10.5 g/dL — ABNORMAL LOW (ref 12.0–15.0)
MCH: 29.2 pg (ref 26.0–34.0)
MCHC: 32.7 g/dL (ref 30.0–36.0)
MCV: 89.2 fL (ref 80.0–100.0)
Platelets: 146 K/uL — ABNORMAL LOW (ref 150–400)
RBC: 3.6 MIL/uL — ABNORMAL LOW (ref 3.87–5.11)
RDW: 15 % (ref 11.5–15.5)
WBC: 6.7 K/uL (ref 4.0–10.5)
nRBC: 0 % (ref 0.0–0.2)

## 2023-11-10 LAB — BASIC METABOLIC PANEL WITH GFR
Anion gap: 14 (ref 5–15)
BUN: 27 mg/dL — ABNORMAL HIGH (ref 8–23)
CO2: 23 mmol/L (ref 22–32)
Calcium: 8.9 mg/dL (ref 8.9–10.3)
Chloride: 102 mmol/L (ref 98–111)
Creatinine, Ser: 1.05 mg/dL — ABNORMAL HIGH (ref 0.44–1.00)
GFR, Estimated: 51 mL/min — ABNORMAL LOW (ref >60)
Glucose, Bld: 109 mg/dL — ABNORMAL HIGH (ref 70–99)
Potassium: 3.5 mmol/L (ref 3.5–5.1)
Sodium: 139 mmol/L (ref 135–145)

## 2023-11-10 NOTE — TOC Progression Note (Signed)
 Transition of Care [facility]) - Progression Note    Patient Details  Name: MAX ROMANO MRN: [id number] Date of Birth: [birth year]  Transition of Care [facility]) CM/SW Contact  Nkosi Cortright [name], [place] Phone Number: 11/10/2023, 12:57 PM  Clinical Narrative:    CSW contacted pt daughter, [name], to provide her with an update on pt SNF recommendations and informed her that CSW contacted [facility] yesterday but did not receive a response. CSW explained that she left [facility] a voicemail and provided [place] with CSW contact information if she had any questions. CSW will continue to follow.    Expected Discharge Plan: Assisted Living Barriers to Discharge: Continued Medical Work up               Expected Discharge Plan and Services       Living arrangements for the past 2 months: Assisted Living Facility                                       Social Drivers of Health (SDOH) Interventions SDOH Screenings   Food Insecurity: No Food Insecurity (11/07/2023)  Housing: Low Risk  (11/07/2023)  Transportation Needs: No Transportation Needs (11/07/2023)  Utilities: Not At Risk (11/07/2023)  Tobacco Use: Low Risk  (11/10/2023)    Readmission Risk Interventions     No data to display

## 2023-11-10 NOTE — Progress Notes (Signed)
 PROGRESS NOTE    Christine [name]  FMW:[id number] DOB: August 04, 1933 DOA: 11/07/2023 PCP: Shayne Anes, MD    Brief Narrative:  Christine [name] is a 89 y.o. female OA, GERD, HLD, HTN, IDA, PAF dementia who presents to ED concern for unwitnessed fall.  Patient with a history of dementia.  History is limited.  EMS stating that SNF staff walked into patient's room and she was sitting on the ground and complaining of left leg/hip pain.  Found to have a UTI and severe arthritis.  Currently PT recommending skilled nursing facility.  Palliative care consult for goals of care with family's blessing   Assessment and Plan: Falls Suspect related to UTI in a setting of dementia PT OT eval-SNF From memory care unit at the facility [facility] consulted)  Aphasia Resolved MRI without stroke but does show severe white matter changes, atrophy   UTI UA with large leukocytes, rare bacteria, greater than 50 WBC UCx growing multiple species Continue Rocephin   History of osteoarthritis Lidocaine  patch and Voltaren gel  Dementia Continue Namenda , Aricept , quetiapine dose decreased to 12.5 to avoid excessive sedation Depakote continued    DVT prophylaxis:  Rivaroxaban  (XARELTO ) tablet 15 mg    Code Status: Limited: Do not attempt resuscitation (DNR) -DNR-LIMITED -Do Not Intubate/DNI  Family Communication: Daughter at bedside  Disposition Plan:  Level of care: Med-Surg Status is: Inpatient    Consultants:  Palliative care   Subjective: Seen patient sitting up on chair, denies any new complaints.  Daughter at bedside, reports patient has been gradually improving mentally.  Objective: Vitals:   11/09/23 2220 11/10/23 0100 11/10/23 0636 11/10/23 1005  BP: (!) 131/57  134/64 132/62  Pulse: 81  69 74  Resp: 20  18 16   Temp: 99.5 F (37.5 C) 98.1 F (36.7 C) (!) 97.5 F (36.4 C) 97.6 F (36.4 C)  TempSrc: Oral Oral Oral Oral  SpO2: 100%  100% 100%  Weight:      Height:         Intake/Output Summary (Last 24 hours) at 11/10/2023 1523 Last data filed at 11/10/2023 0330 Gross per 24 hour  Intake 200 ml  Output 1 ml  Net 199 ml   Filed Weights   11/07/23 1033  Weight: 56.4 kg    Examination: General: NAD, elderly Cardiovascular: S1, S2 present Respiratory: CTAB Abdomen: Soft, nontender, nondistended, bowel sounds present Musculoskeletal: No bilateral pedal edema noted Skin: Normal Psychiatry: Normal mood      Data Reviewed: I have personally reviewed following labs and imaging studies  CBC: Recent Labs  Lab 11/06/23 0937 11/07/23 1050 11/09/23 0531 11/10/23 0448  WBC 6.4 8.4 8.5 6.7  NEUTROABS 4.0 6.3  --   --   HGB 11.1* 11.1* 10.7* 10.5*  HCT 35.5* 34.7* 32.6* 32.1*  MCV 91.5 91.6 88.8 89.2  PLT 135* 160 142* 146*   Basic Metabolic Panel: Recent Labs  Lab 11/06/23 0937 11/07/23 1050 11/09/23 0531 11/10/23 0448  NA 140 141 140 139  K 4.3 4.3 4.2 3.5  CL 104 105 102 102  CO2 24 24 23 23   GLUCOSE 108* 125* 123* 109*  BUN 22 19 23  27*  CREATININE 1.01* 1.13* 1.17* 1.05*  CALCIUM  9.5 9.0 8.8* 8.9   GFR: Estimated Creatinine Clearance (by C-G formula based on SCr of 1.05 mg/dL (H)) Female: 32 mL/min (A) Female: 38.8 mL/min (A) Liver Function Tests: Recent Labs  Lab 11/07/23 1050  AST 19  ALT 18  ALKPHOS 66  BILITOT 1.1  PROT 7.1  ALBUMIN 3.5   No results for input(s): LIPASE, AMYLASE in the last 168 hours. No results for input(s): AMMONIA in the last 168 hours. Coagulation Profile: Recent Labs  Lab 11/07/23 1050  INR 1.1   Cardiac Enzymes: No results for input(s): CKTOTAL, CKMB, CKMBINDEX, TROPONINI in the last 168 hours. BNP (last 3 results) No results for input(s): PROBNP in the last 8760 hours. HbA1C: Recent Labs    11/08/23 0129  HGBA1C 5.2   CBG: No results for input(s): GLUCAP in the last 168 hours. Lipid Profile: Recent Labs    11/08/23 0129  CHOL 161  HDL 73  LDLCALC 78   TRIG 52  CHOLHDL 2.2   Thyroid  Function Tests: No results for input(s): TSH, T4TOTAL, FREET4, T3FREE, THYROIDAB in the last 72 hours. Anemia Panel: No results for input(s): VITAMINB12, FOLATE, FERRITIN, TIBC, IRON, RETICCTPCT in the last 72 hours. Sepsis Labs: Recent Labs  Lab 11/07/23 1112 11/07/23 1328  LATICACIDVEN 2.0* 0.9    Recent Results (from the past 240 hours)  Resp panel by RT-PCR (RSV, Flu A&B, Covid) Anterior Nasal Swab     Status: None   Collection Time: 11/07/23 10:50 AM   Specimen: Anterior Nasal Swab  Result Value Ref Range Status   SARS Coronavirus 2 by RT PCR NEGATIVE NEGATIVE Final   Influenza A by PCR NEGATIVE NEGATIVE Final   Influenza B by PCR NEGATIVE NEGATIVE Final    Comment: (NOTE) The Xpert Xpress SARS-CoV-2/FLU/RSV plus assay is intended as an aid in the diagnosis of influenza from Nasopharyngeal swab specimens and should not be used as a sole basis for treatment. Nasal washings and aspirates are unacceptable for Xpert Xpress SARS-CoV-2/FLU/RSV testing.  Fact Sheet for Patients: bloggercourse.com  Fact Sheet for Healthcare Providers: seriousbroker.it  This test is not yet approved or cleared by the [place]  FDA and has been authorized for detection and/or diagnosis of SARS-CoV-2 by FDA under an Emergency Use Authorization (EUA). This EUA will remain in effect (meaning this test can be used) for the duration of the COVID-19 declaration under Section 564(b)(1) of the Act, 21 U.S.C. section 360bbb-3(b)(1), unless the authorization is terminated or revoked.     Resp Syncytial Virus by PCR NEGATIVE NEGATIVE Final    Comment: (NOTE) Fact Sheet for Patients: bloggercourse.com  Fact Sheet for Healthcare Providers: seriousbroker.it  This test is not yet approved or cleared by the [place]  FDA and has been  authorized for detection and/or diagnosis of SARS-CoV-2 by FDA under an Emergency Use Authorization (EUA). This EUA will remain in effect (meaning this test can be used) for the duration of the COVID-19 declaration under Section 564(b)(1) of the Act, 21 U.S.C. section 360bbb-3(b)(1), unless the authorization is terminated or revoked.  Performed at [facility], [address], [place], [place] [address]   Blood Culture (routine x 2)     Status: None (Preliminary result)   Collection Time: 11/07/23 10:50 AM   Specimen: BLOOD  Result Value Ref Range Status   Specimen Description BLOOD LEFT ANTECUBITAL  Final   Special Requests   Final    BOTTLES DRAWN AEROBIC AND ANAEROBIC Blood Culture adequate volume   Culture   Final    NO GROWTH 3 DAYS Performed at [facility], [address] [place], [place] [address]    Report Status PENDING  Incomplete  Blood Culture (routine x 2)     Status: None (Preliminary result)   Collection Time: 11/07/23 10:55 AM  Specimen: BLOOD RIGHT HAND  Result Value Ref Range Status   Specimen Description BLOOD RIGHT HAND  Final   Special Requests   Final    BOTTLES DRAWN AEROBIC ONLY Blood Culture results may not be optimal due to an inadequate volume of blood received in culture bottles   Culture   Final    NO GROWTH 3 DAYS Performed at [facility], [address], [place], [place] [address]    Report Status PENDING  Incomplete  Urine Culture (for pregnant, neutropenic or urologic patients or patients with an indwelling urinary catheter)     Status: Abnormal   Collection Time: 11/08/23  1:51 PM   Specimen: Urine, Clean Catch  Result Value Ref Range Status   Specimen Description URINE, CLEAN CATCH  Final   Special Requests   Final    NONE Performed at [facility], [address] [place], [place] [address]    Culture MULTIPLE SPECIES PRESENT, SUGGEST RECOLLECTION (A)  Final   Report Status 11/09/2023 FINAL  Final          Radiology Studies: No results found.       Scheduled Meds:   stroke: early stages of recovery book   Does not apply Once   cholecalciferol   1,000 Units Oral Daily   cyanocobalamin   1,000 mcg Oral Daily   diclofenac Sodium  2 g Topical QID   divalproex  125 mg Oral Q12H   donepezil   10 mg Oral QHS   feeding supplement  1 Container Oral TID BM   lidocaine   2 patch Transdermal Q24H   LORazepam   1 mg Oral BID   memantine   5 mg Oral Daily   metoprolol  tartrate  12.5 mg Oral BID   mirabegron  ER  50 mg Oral Daily   multivitamin with minerals  1 tablet Oral Daily   QUEtiapine  12.5 mg Oral QHS   Rivaroxaban   15 mg Oral Q supper   Continuous Infusions:  cefTRIAXone  (ROCEPHIN )  IV 1 g (11/10/23 1208)     LOS: 2 days    Time spent: 45 minutes spent on chart review, discussion with nursing staff, consultants, updating family and interview/physical exam; more than 50% of that time was spent in counseling and/or coordination of care.    Lebron JINNY Cage, MD [facility] Available via Epic secure chat 7am-7pm After these hours, please refer to coverage provider listed on [email or web address] 11/10/2023, 3:23 PM

## 2023-11-10 NOTE — Progress Notes (Signed)
 Palliative:  HPI: 89 y.o. female  with past medical history of CAD/NSTEMI, PAF on Xarelto , GERD esophagitis, recurrent falls, AKI, rhabdomyolysis, HTN, dementia, falls, UTI, syncope and collapse admitted on 11/07/2023 with suspected fall secondary to UTI.    I met today with Christine [name] and daughter, [name], at bedisde. Christine [name] is in recliner. She awakens and is more alert and interactive today. [name] reports that she ate all her breakfast and a mostly eaten [employer] is also at bedside. Christine [name] is sharing she has to have a bowel movement. RN and NT to bedside to get up to [facility]. Christine [name] yelled out when transferred onto [facility] but this seemed more from fear than pain/discomfort.   I spoke further with [name] about GOC. I gave her Hard Choices and MOST form. We reviewed. She will continue to monitor and assess wishes for limited interventions with hospital readmission vs comfort measures if further decline. At this time she is interested in pursuing SNF rehab with hopes of improvement to return to memory care ALF.   All questions/concerns addressed. Emotional support provided.   Exam: More awake and interactive. Underlying confusion. No distress. Breathing regular, unlabored. Abd soft. Weak.   Plan: - DNR, no feeding tube - SNF rehab with palliative to follow  40 min  Bernarda Kitty, NP Palliative Medicine Team Pager [phone] (Please see [email or web address] for schedule) Team Phone [phone]

## 2023-11-10 NOTE — Progress Notes (Signed)
 Physical Therapy Treatment  Patient Details Name: Christine [name] MRN: [id number] DOB: 12-03-1933 Today's Date: 11/10/2023   History of Present Illness Pt is an 89 y/o female who presents 11/07/2023 s/p fall at [facility] (memory care). Imaging negative for acute injury, and MRI of brain negative for acute intracranial changes. PMH signficant for TIA, HTN, dementia, osteopenia, L THA.    PT Comments  Pt progressing towards physical therapy goals. Was able to perform transfers and ambulation with up to mod assist. +2 helpful for safety, line management, and chair follow. Pt had a successful BM on the BSC and ambulated ~25'. Acute PT recommendations remain appropriate for short term rehab <3 hours/day, and DME recommendations updated to reflect current level of function. Will continue to follow.     If plan is discharge home, recommend the following: Two people to help with walking and/or transfers;Two people to help with bathing/dressing/bathroom;Assistance with cooking/housework;Direct supervision/assist for medications management;Direct supervision/assist for financial management;Assist for transportation;Supervision due to cognitive status;Help with stairs or ramp for entrance   Can travel by private vehicle     No  Equipment Recommendations  Wheelchair (measurements PT);Wheelchair cushion (measurements PT);Rolling walker (2 wheels)    Recommendations for Other Services       Precautions / Restrictions Precautions Precautions: Fall Recall of Precautions/Restrictions: Impaired Restrictions Weight Bearing Restrictions Per Provider Order: No     Mobility  Bed Mobility Overal bed mobility: Needs Assistance Bed Mobility: Supine to Sit     Supine to sit: HOB elevated, Min assist     General bed mobility comments: Pt initiating movement towards EOB, reporting urgency to urinate. Assist for RLE advancement and trunk support with initial sitting.    Transfers Overall  transfer level: Needs assistance Equipment used: Rolling walker (2 wheels) Transfers: Sit to/from Stand, Bed to chair/wheelchair/BSC Sit to Stand: Min assist, +2 physical assistance, +2 safety/equipment   Step pivot transfers: Min assist, +2 physical assistance, +2 safety/equipment       General transfer comment: Multimodal cues for hand placement on seated surface for safety. Assist for power up to full stand and to gain/maintain standing balance. Posterior bias.    Ambulation/Gait Ambulation/Gait assistance: Min assist, Mod assist, +2 safety/equipment Gait Distance (Feet): 25 Feet Assistive device: Rolling walker (2 wheels) Gait Pattern/deviations: Step-through pattern, Decreased stride length, Trunk flexed, Narrow base of support Gait velocity: Decreased Gait velocity interpretation: <1.31 ft/sec, indicative of household ambulator   General Gait Details: Posterior bias and up to mod assist at times for balance and walker management. +2 for IV pole management and daughter pushing chair behind us  for added safety.   Stairs             Wheelchair Mobility     Tilt Bed    Modified Rankin (Stroke Patients Only)       Balance Overall balance assessment: Needs assistance Sitting-balance support: Single extremity supported, Bilateral upper extremity supported, Feet supported Sitting balance-Leahy Scale: Poor     Standing balance support: Bilateral upper extremity supported, During functional activity, Reliant on assistive device for balance Standing balance-Leahy Scale: Poor                              Communication Communication Communication: Impaired Factors Affecting Communication: Reduced clarity of speech;Difficulty expressing self  Cognition Arousal: Alert Behavior During Therapy: Anxious   PT - Cognitive impairments: History of cognitive impairments  Following commands: Impaired Following commands impaired:  Follows one step commands inconsistently    Cueing Cueing Techniques: Verbal cues, Gestural cues, Tactile cues, Visual cues  Exercises      General Comments        Pertinent Vitals/Pain Pain Assessment Pain Assessment: Faces Faces Pain Scale: Hurts little more Pain Location: L hip Pain Descriptors / Indicators: Operative site guarding, Grimacing Pain Intervention(s): Limited activity within patient's tolerance, Monitored during session, Repositioned    Home Living                          Prior Function            PT Goals (current goals can now be found in the care plan section) Acute Rehab PT Goals Patient Stated Goal: Decrease pain PT Goal Formulation: With patient/family Time For Goal Achievement: 11/22/23 Potential to Achieve Goals: Fair Progress towards PT goals: Progressing toward goals    Frequency    Min 2X/week      PT Plan      Co-evaluation              AM-PAC PT 6 Clicks Mobility   Outcome Measure  Help needed turning from your back to your side while in a flat bed without using bedrails?: Total Help needed moving from lying on your back to sitting on the side of a flat bed without using bedrails?: Total Help needed moving to and from a bed to a chair (including a wheelchair)?: Total Help needed standing up from a chair using your arms (e.g., wheelchair or bedside chair)?: Total Help needed to walk in hospital room?: Total Help needed climbing 3-5 steps with a railing? : Total 6 Click Score: 6    End of Session Equipment Utilized During Treatment: Gait belt Activity Tolerance: Patient tolerated treatment well Patient left: in chair;with call bell/phone within reach;with chair alarm set;with family/visitor present Nurse Communication: Mobility status (IV status - dried blood around site) PT Visit Diagnosis: Pain;Difficulty in walking, not elsewhere classified (R26.2);History of falling (Z91.81);Muscle weakness (generalized)  (M62.81) Pain - Right/Left: Left Pain - part of body: Hip     Time: 8667-8592 PT Time Calculation (min) (ACUTE ONLY): 35 min  Charges:    $Gait Training: 8-22 mins $Therapeutic Activity: 8-22 mins PT General Charges $$ ACUTE PT VISIT: 1 Visit                     Leita Sable, PT, DPT Acute Rehabilitation Services Secure Chat Preferred Office: [phone]    Leita JONETTA Sable 11/10/2023, 2:17 PM

## 2023-11-10 NOTE — Plan of Care (Signed)
  Problem: Coping: Goal: Will verbalize positive feelings about self Outcome: Progressing   Problem: Nutrition: Goal: Dietary intake will improve Outcome: Progressing   Problem: Health Behavior/Discharge Planning: Goal: Ability to manage health-related needs will improve Outcome: Progressing   Problem: Clinical Measurements: Goal: Will remain free from infection Outcome: Progressing   Problem: Pain Managment: Goal: General experience of comfort will improve and/or be controlled Outcome: Progressing   Problem: Safety: Goal: Ability to remain free from injury will improve Outcome: Progressing   Problem: Skin Integrity: Goal: Risk for impaired skin integrity will decrease Outcome: Progressing

## 2023-11-10 NOTE — Plan of Care (Signed)
  Problem: Clinical Measurements: Goal: Will remain free from infection Outcome: Progressing   Problem: Pain Managment: Goal: General experience of comfort will improve and/or be controlled Outcome: Progressing   Problem: Safety: Goal: Ability to remain free from injury will improve Outcome: Progressing   Problem: Skin Integrity: Goal: Risk for impaired skin integrity will decrease Outcome: Progressing

## 2023-11-11 DIAGNOSIS — Z7189 Other specified counseling: Secondary | ICD-10-CM | POA: Diagnosis not present

## 2023-11-11 DIAGNOSIS — F0390 Unspecified dementia without behavioral disturbance: Secondary | ICD-10-CM | POA: Diagnosis not present

## 2023-11-11 DIAGNOSIS — Z515 Encounter for palliative care: Secondary | ICD-10-CM | POA: Diagnosis not present

## 2023-11-11 MED ORDER — HALOPERIDOL LACTATE 5 MG/ML IJ SOLN
1.0000 mg | Freq: Once | INTRAMUSCULAR | Status: AC
  Administered 2023-11-11: 1 mg via INTRAVENOUS
  Filled 2023-11-11: qty 1

## 2023-11-11 MED ORDER — MORPHINE SULFATE (PF) 2 MG/ML IV SOLN
1.0000 mg | Freq: Once | INTRAVENOUS | Status: AC
  Administered 2023-11-11: 1 mg via INTRAVENOUS
  Filled 2023-11-11: qty 1

## 2023-11-11 NOTE — NC FL2 (Signed)
 [place]  MEDICAID FL2 LEVEL OF CARE FORM     IDENTIFICATION  Patient Name: Christine [name] Birthdate: [birth year] Sex: adult Admission Date (Current Location): 11/07/2023  [facility] [place] Number:  Producer, Television/film/video and Address:  [facility], [address] [place], [place] [address]      Provider Number: [id number]  Attending Physician Name and Address:  Donnamarie Lebron PARAS, MD  Relative Name and Phone Number:       Current Level of Care: Hospital Recommended Level of Care: Skilled Nursing Facility Prior Approval Number:    Date Approved/Denied:   PASRR Number: [id number]  Discharge Plan: SNF    Current Diagnoses: Patient Active Problem List   Diagnosis Date Noted   Protein-calorie malnutrition, severe 11/08/2023   Syncope 01/30/2022   Syncope and collapse 01/30/2022   Malnutrition of moderate degree 03/15/2021   Vitamin B12 deficiency 03/14/2021   Dyslipidemia 03/14/2021   Recurrent falls 03/14/2021   NSTEMI (non-ST elevated myocardial infarction) (HCC) 03/13/2021   History of kidney stones 10/23/2020   Dementia without behavioral disturbance (HCC) 08/30/2020   Pain in right knee 08/30/2020   Vitamin D  deficiency 08/30/2020   Bilateral impacted cerumen 08/22/2019   Thrombophilia 03/02/2019   Increased frequency of urination 10/14/2017   Impacted cerumen 08/04/2016   Age-related osteoporosis without current pathological fracture 07/06/2016   Hip fracture (HCC) 05/29/2016   Closed left hip fracture (HCC) 05/27/2016   Insomnia 12/17/2015   Memory loss 12/17/2015   Dysuria 12/04/2015   AKI (acute kidney injury) 10/01/2015   Acute encephalopathy 10/01/2015   Interstitial cystitis 10/01/2015   Essential hypertension 10/01/2015   Rhabdomyolysis 09/30/2015   Major depression, single episode 07/10/2015   Non-thrombocytopenic purpura 07/10/2015   Encounter for general adult medical examination without abnormal findings 11/30/2014    Fall 08/07/2013   Pain in left leg 08/07/2013   Lower urinary tract infectious disease 07/16/2013   Paroxysmal atrial fibrillation (HCC) 07/16/2013   Chest pain 07/16/2013   Transient ischemic attack 07/04/2012   Chronic pain syndrome 03/28/2012   Residual hemorrhoidal skin tags 01/18/2012   Skin sensation disturbance 03/03/2010   Abdominal pain 02/03/2010   Pain in limb 10/07/2009   Anemia 03/26/2009   Allergic rhinitis 02/27/2009   Anxiety disorder 02/27/2009   Hyperlipidemia 02/27/2009   Low compliance bladder 02/27/2009   DYSPEPSIA 01/18/2008   ADENOMATOUS COLONIC POLYP 01/17/2008   HEMORRHOIDS, INTERNAL 01/17/2008   ESOPHAGITIS 01/17/2008   DIVERTICULOSIS, COLON 01/17/2008   HEMORRHOIDS-EXTERNAL 12/07/2007   GERD 12/07/2007   Constipation 12/07/2007   RECTAL BLEEDING 12/07/2007   Loss of weight 12/07/2007   Nausea alone 12/07/2007   HEARTBURN 12/07/2007   ABDOMINAL BLOATING 12/07/2007   History of colonic polyps 12/07/2007    Orientation RESPIRATION BLADDER Height & Weight     Self  Normal   Weight: 124 lb 5.4 oz (56.4 kg) Height:  5' 4 (162.6 cm)  BEHAVIORAL SYMPTOMS/MOOD NEUROLOGICAL BOWEL NUTRITION STATUS        Diet (See DC Summary)  AMBULATORY STATUS COMMUNICATION OF NEEDS Skin   Extensive Assist   Normal                       Personal Care Assistance Level of Assistance  Bathing, Dressing Bathing Assistance: Maximum assistance   Dressing Assistance: Maximum assistance     Functional Limitations Info  Sight, Hearing, Speech Sight Info: Impaired Hearing Info: Adequate Speech Info: Adequate    SPECIAL CARE  FACTORS FREQUENCY  PT (By licensed PT), OT (By licensed OT)     PT Frequency: 5x/week OT Frequency: 5x/week            Contractures Contractures Info: Not present    Additional Factors Info  Code Status, Allergies Code Status Info: DNR-Limited Allergies Info: Ambien  (Zolpidem  Tartrate); Celexa (Citalopram); Cymbalta  (Duloxetine Hcl); Ditropan Xl (Oxybutynin Chloride); Lescol (Fluvastatin); Lipitor (Pregabalin ); Neurontin (Gabapentin); Pravastatin; Tape; Zetia (Ezetimibe); Zocor (Simvastatin)           Current Medications (11/11/2023):  This is the current hospital active medication list Current Facility-Administered Medications  Medication Dose Route Frequency Provider Last Rate Last Admin    stroke: early stages of recovery book   Does not apply Once Tobie Mario GAILS, MD       acetaminophen  (TYLENOL ) tablet 650 mg  650 mg Oral Q4H PRN Patel, Ekta V, MD   650 mg at 11/11/23 9675   Or   acetaminophen  (TYLENOL ) suppository 650 mg  650 mg Rectal Q4H PRN Tobie Mario GAILS, MD       cefTRIAXone  (ROCEPHIN ) 1 g in sodium chloride  0.9 % 100 mL IVPB  1 g Intravenous Q24H Patel, Ekta V, MD 200 mL/hr at 11/11/23 1403 1 g at 11/11/23 1403   cholecalciferol  (VITAMIN D3) 25 MCG (1000 UNIT) tablet 1,000 Units  1,000 Units Oral Daily Vann, Jessica U, DO   1,000 Units at 11/11/23 1035   cyanocobalamin  (VITAMIN B12) tablet 1,000 mcg  1,000 mcg Oral Daily Patel, Ekta V, MD   1,000 mcg at 11/11/23 1035   diclofenac Sodium (VOLTAREN) 1 % topical gel 2 g  2 g Topical QID Vann, Jessica U, DO   2 g at 11/11/23 1402   divalproex (DEPAKOTE SPRINKLE) capsule 125 mg  125 mg Oral Q12H Tobie Mario V, MD   125 mg at 11/11/23 1035   donepezil  (ARICEPT ) tablet 10 mg  10 mg Oral QHS Patel, Ekta V, MD   10 mg at 11/10/23 2112   feeding supplement (BOOST / RESOURCE BREEZE) liquid 1 Container  1 Container Oral TID BM Vann, Jessica U, DO   1 Container at 11/10/23 2114   lidocaine  (LIDODERM ) 5 % 2 patch  2 patch Transdermal Q24H Vann, Jessica U, DO   2 patch at 11/10/23 1737   LORazepam  (ATIVAN ) tablet 1 mg  1 mg Oral BID Vann, Jessica U, DO   1 mg at 11/10/23 2111   memantine  (NAMENDA ) tablet 5 mg  5 mg Oral Daily Tobie Mario GAILS, MD   5 mg at 11/10/23 0944   metoprolol  tartrate (LOPRESSOR ) tablet 12.5 mg  12.5 mg Oral BID Vann, Jessica U, DO   12.5 mg  at 11/11/23 1035   mirabegron  ER (MYRBETRIQ ) tablet 50 mg  50 mg Oral Daily Patel, Ekta V, MD   50 mg at 11/11/23 1035   multivitamin with minerals tablet 1 tablet  1 tablet Oral Daily Vann, Jessica U, DO   1 tablet at 11/11/23 1035   QUEtiapine (SEROQUEL) tablet 12.5 mg  12.5 mg Oral QHS Tobie Mario V, MD   12.5 mg at 11/10/23 2111   Rivaroxaban  (XARELTO ) tablet 15 mg  15 mg Oral Q supper Patel, Ekta V, MD   15 mg at 11/10/23 1737     Discharge Medications: Please see discharge summary for a list of discharge medications.  Relevant Imaging Results:  Relevant Lab Results:   Additional Information SSN: [id number]  Jeoffrey [name], LCSWA

## 2023-11-11 NOTE — Plan of Care (Signed)
  Problem: Ischemic Stroke/TIA Tissue Perfusion: Goal: Complications of ischemic stroke/TIA will be minimized Outcome: Progressing   Problem: Nutrition: Goal: Risk of aspiration will decrease Outcome: Progressing   Problem: Activity: Goal: Risk for activity intolerance will decrease Outcome: Progressing   Problem: Nutrition: Goal: Adequate nutrition will be maintained Outcome: Progressing

## 2023-11-11 NOTE — TOC Progression Note (Signed)
 Transition of Care [facility]) - Progression Note    Patient Details  Name: Christine [name] MRN: [id number] Date of Birth: [birth year]  [facility] CM/SW Contact  Hughey Rittenberry [name], [place] Phone Number: 11/11/2023, 2:08 PM  Clinical Narrative:    CSW spoke with pt daughter, Christine [name] at bedside about SNF recommendation. Christine [name] stated she would like for pt to go to SNF following hospital DC and [facility] is their first choice. CSW completed Fl2 and sent out SNF referrals. CSW will follow up to provide SNF bed offers.    Expected Discharge Plan: Skilled Nursing Facility Barriers to Discharge: Continued Medical Work up, English As A Second Language Teacher, SNF Pending bed offer               Expected Discharge Plan and Services       Living arrangements for the past 2 months: Independent Living Facility                                       Social Drivers of Health (SDOH) Interventions SDOH Screenings   Food Insecurity: No Food Insecurity (11/07/2023)  Housing: Low Risk  (11/07/2023)  Transportation Needs: No Transportation Needs (11/07/2023)  Utilities: Not At Risk (11/07/2023)  Tobacco Use: Low Risk  (11/10/2023)    Readmission Risk Interventions     No data to display

## 2023-11-11 NOTE — Progress Notes (Signed)
 Occupational Therapy Treatment Patient Details Name: Christine [name] MRN: [id number] DOB: October 30, 1933 Today's Date: 11/11/2023   History of present illness Pt is an 89 y/o female who presents 11/07/2023 s/p fall at [facility] (memory care). Imaging negative for acute injury, and MRI of brain negative for acute intracranial changes. PMH signficant for TIA, HTN, dementia, osteopenia, L THA.   OT comments  Pt progressing towards goals this date. Focus of session on progressing functional transfers and increasing independence in ADL tasks. Pt required up to Max A +2 for ambulatory transfer to [facility] this date and continues to require up to Max A for ADL tasks. Pt with increased lethargy this date limiting participation. OT to continue per POC, current d/c recommendation remains appropriate.       If plan is discharge home, recommend the following:  Two people to help with walking and/or transfers;A lot of help with bathing/dressing/bathroom;Assistance with cooking/housework;Assistance with feeding;Direct supervision/assist for medications management;Direct supervision/assist for financial management;Assist for transportation;Supervision due to cognitive status   Equipment Recommendations  Other (comment) (defer to next LOC)    Recommendations for Other Services      Precautions / Restrictions Precautions Precautions: Fall Recall of Precautions/Restrictions: Impaired Restrictions Weight Bearing Restrictions Per Provider Order: No       Mobility Bed Mobility               General bed mobility comments: Pt greeted in recliner and returned to recliner.    Transfers Overall transfer level: Needs assistance Equipment used: Rolling walker (2 wheels) Transfers: Sit to/from Stand, Bed to chair/wheelchair/BSC Sit to Stand: Max assist, +2 physical assistance     Step pivot transfers: Max assist, +2 physical assistance     General transfer comment: Pt with decreased transfer this  date as compared to chart review. Pt with strong posterior lean this date, pushing against staff. Pt unable to fully extend trunk and knees for supportive and safe standing position. Pt with decreased ability to utilize RW, requiring assist to manage RW. Uncontrolled descent back to recliner. Pt with inability to safely follow commands this date for mobility outside of room.     Balance Overall balance assessment: Needs assistance Sitting-balance support: Bilateral upper extremity supported, Feet supported Sitting balance-Leahy Scale: Poor Sitting balance - Comments: Pt requires assistance to maintain sitting balance without posterior support. Postural control: Posterior lean Standing balance support: Bilateral upper extremity supported, During functional activity, Reliant on assistive device for balance Standing balance-Leahy Scale: Poor Standing balance comment: dependent on RW and external support                           ADL either performed or assessed with clinical judgement   ADL Overall ADL's : Needs assistance/impaired     Grooming: Wash/dry face;Minimal assistance;Cueing for sequencing;Cueing for UE precautions                   Toilet Transfer: Maximal assistance;+2 for physical assistance;Ambulation;BSC/3in1;Rolling walker (2 wheels) Toilet Transfer Details (indicate cue type and reason): Pt required Mod-Max A +2 for toilet transfer this date with BSC.                Extremity/Trunk Assessment Upper Extremity Assessment Upper Extremity Assessment: Generalized weakness;RUE deficits/detail;Right hand dominant RUE Deficits / Details: Noted this session, and per Pt daughter report, Pt with edema in R index finger and non-use of RUE. Pt required dense multimodal cues to utilize RUE to complete  grooming task and initiate movement.            Vision       Perception     Praxis     Communication Communication Communication: Impaired Factors  Affecting Communication: Reduced clarity of speech;Difficulty expressing self   Cognition Arousal: Lethargic Behavior During Therapy: Anxious Cognition: History of cognitive impairments             OT - Cognition Comments: baseline dementia                 Following commands: Impaired Following commands impaired: Follows one step commands inconsistently      Cueing   Cueing Techniques: Verbal cues, Gestural cues, Tactile cues, Visual cues  Exercises      Shoulder Instructions       General Comments Daughter present for session, expressed concern regarding Pt RUE. Questions answered within therapist scope. RUE addressed during session, no decrease of ROM observed per daughter report but Pt does not initiate movement.    Pertinent Vitals/ Pain       Pain Assessment Pain Assessment: Faces Faces Pain Scale: Hurts even more Pain Descriptors / Indicators: Discomfort, Grimacing, Guarding Pain Intervention(s): Limited activity within patient's tolerance, Monitored during session  Home Living                                          Prior Functioning/Environment              Frequency  Min 1X/week        Progress Toward Goals  OT Goals(current goals can now be found in the care plan section)  Progress towards OT goals: Progressing toward goals  Acute Rehab OT Goals Patient Stated Goal: none stated this session OT Goal Formulation: Patient unable to participate in goal setting Time For Goal Achievement: 11/22/23 Potential to Achieve Goals: Fair ADL Goals Pt Will Perform Grooming: with min assist;sitting Pt Will Perform Upper Body Bathing: sitting;with contact guard assist Pt Will Perform Lower Body Bathing: with min assist;sitting/lateral leans Pt Will Perform Upper Body Dressing: with contact guard assist;sitting Pt Will Perform Lower Body Dressing: with min assist;sitting/lateral leans Additional ADL Goal #1: Pt will engage in bed  mobility with Mod A to sit EOB as a precursor to ADL engagement  Plan      Co-evaluation                 AM-PAC OT 6 Clicks Daily Activity     Outcome Measure   Help from another person eating meals?: A Little Help from another person taking care of personal grooming?: A Lot Help from another person toileting, which includes using toliet, bedpan, or urinal?: A Lot Help from another person bathing (including washing, rinsing, drying)?: A Lot Help from another person to put on and taking off regular upper body clothing?: A Lot Help from another person to put on and taking off regular lower body clothing?: A Lot 6 Click Score: 13    End of Session Equipment Utilized During Treatment: Gait belt;Rolling walker (2 wheels)  OT Visit Diagnosis: Muscle weakness (generalized) (M62.81);History of falling (Z91.81);Other symptoms and signs involving cognitive function;Pain Pain - Right/Left: Left Pain - part of body: Leg   Activity Tolerance Patient limited by lethargy   Patient Left in chair;with call bell/phone within reach;with family/visitor present   Nurse Communication  Time: 8871-8854 OT Time Calculation (min): 17 min  Charges: OT General Charges $OT Visit: 1 Visit OT Treatments $Therapeutic Activity: 8-22 mins  Maurilio CROME, OTR/L.  [facility] Acute Rehabilitation  Office: [phone]   Maurilio PARAS Iram Astorino 11/11/2023, 1:12 PM

## 2023-11-11 NOTE — Progress Notes (Signed)
 PROGRESS NOTE    Christine [name]  FMW:[id number] DOB: 02/19/1933 DOA: 11/07/2023 PCP: Shayne Anes, MD    Brief Narrative:  Christine [name] is a 89 y.o. female OA, GERD, HLD, HTN, IDA, PAF dementia who presents to ED concern for unwitnessed fall.  Patient with a history of dementia.  History is limited.  EMS stating that SNF staff walked into patient's room and she was sitting on the ground and complaining of left leg/hip pain.  Found to have a UTI and severe arthritis.  Currently PT recommending skilled nursing facility.  Palliative care consult for goals of care with family's blessing   Assessment and Plan: Falls Suspect related to UTI in a setting of dementia PT OT eval-SNF From memory care unit at the facility [facility] consulted)  Aphasia Resolved MRI without stroke but does show severe white matter changes, atrophy   UTI UA with large leukocytes, rare bacteria, greater than 50 WBC UCx growing multiple species Continue Rocephin  x 5 days  History of osteoarthritis Lidocaine  patch and Voltaren gel  Dementia Continue Namenda , Aricept , quetiapine dose decreased to 12.5 to avoid excessive sedation Depakote continued    DVT prophylaxis:  Rivaroxaban  (XARELTO ) tablet 15 mg    Code Status: Limited: Do not attempt resuscitation (DNR) -DNR-LIMITED -Do Not Intubate/DNI  Family Communication: Daughter at bedside  Disposition Plan:  Level of care: Med-Surg Status is: Inpatient    Consultants:  Palliative care   Subjective: Denies any new complaints.  Overnight noted to have some sundowning episodes.  Appears comfortable this a.m., resting well in chair.  Daughter at bedside, discussed extensively about SNF/options.  Objective: Vitals:   11/10/23 1804 11/10/23 2017 11/11/23 0430 11/11/23 1028  BP: (!) 146/65 132/65 121/80 (!) 128/56  Pulse: 81 78 73 78  Resp: 16   13  Temp: (!) 97.5 F (36.4 C) 98.1 F (36.7 C) 98.1 F (36.7 C) 97.8 F (36.6 C)  TempSrc: Oral Oral  Oral Oral  SpO2: 100% 100% 97% 99%  Weight:      Height:        Intake/Output Summary (Last 24 hours) at 11/11/2023 1515 Last data filed at 11/11/2023 0900 Gross per 24 hour  Intake 220 ml  Output --  Net 220 ml   Filed Weights   11/07/23 1033  Weight: 56.4 kg    Examination: General: NAD, elderly Cardiovascular: S1, S2 present Respiratory: CTAB Abdomen: Soft, nontender, nondistended, bowel sounds present Musculoskeletal: No bilateral pedal edema noted Skin: Normal Psychiatry: Normal mood      Data Reviewed: I have personally reviewed following labs and imaging studies  CBC: Recent Labs  Lab 11/06/23 0937 11/07/23 1050 11/09/23 0531 11/10/23 0448  WBC 6.4 8.4 8.5 6.7  NEUTROABS 4.0 6.3  --   --   HGB 11.1* 11.1* 10.7* 10.5*  HCT 35.5* 34.7* 32.6* 32.1*  MCV 91.5 91.6 88.8 89.2  PLT 135* 160 142* 146*   Basic Metabolic Panel: Recent Labs  Lab 11/06/23 0937 11/07/23 1050 11/09/23 0531 11/10/23 0448  NA 140 141 140 139  K 4.3 4.3 4.2 3.5  CL 104 105 102 102  CO2 24 24 23 23   GLUCOSE 108* 125* 123* 109*  BUN 22 19 23  27*  CREATININE 1.01* 1.13* 1.17* 1.05*  CALCIUM  9.5 9.0 8.8* 8.9   GFR: Estimated Creatinine Clearance (by C-G formula based on SCr of 1.05 mg/dL (H)) Female: 32 mL/min (A) Female: 38.8 mL/min (A) Liver Function Tests: Recent Labs  Lab 11/07/23 1050  AST  19  ALT 18  ALKPHOS 66  BILITOT 1.1  PROT 7.1  ALBUMIN 3.5   No results for input(s): LIPASE, AMYLASE in the last 168 hours. No results for input(s): AMMONIA in the last 168 hours. Coagulation Profile: Recent Labs  Lab 11/07/23 1050  INR 1.1   Cardiac Enzymes: No results for input(s): CKTOTAL, CKMB, CKMBINDEX, TROPONINI in the last 168 hours. BNP (last 3 results) No results for input(s): PROBNP in the last 8760 hours. HbA1C: No results for input(s): HGBA1C in the last 72 hours.  CBG: No results for input(s): GLUCAP in the last 168 hours. Lipid  Profile: No results for input(s): CHOL, HDL, LDLCALC, TRIG, CHOLHDL, LDLDIRECT in the last 72 hours.  Thyroid  Function Tests: No results for input(s): TSH, T4TOTAL, FREET4, T3FREE, THYROIDAB in the last 72 hours. Anemia Panel: No results for input(s): VITAMINB12, FOLATE, FERRITIN, TIBC, IRON, RETICCTPCT in the last 72 hours. Sepsis Labs: Recent Labs  Lab 11/07/23 1112 11/07/23 1328  LATICACIDVEN 2.0* 0.9    Recent Results (from the past 240 hours)  Resp panel by RT-PCR (RSV, Flu A&B, Covid) Anterior Nasal Swab     Status: None   Collection Time: 11/07/23 10:50 AM   Specimen: Anterior Nasal Swab  Result Value Ref Range Status   SARS Coronavirus 2 by RT PCR NEGATIVE NEGATIVE Final   Influenza A by PCR NEGATIVE NEGATIVE Final   Influenza B by PCR NEGATIVE NEGATIVE Final    Comment: (NOTE) The Xpert Xpress SARS-CoV-2/FLU/RSV plus assay is intended as an aid in the diagnosis of influenza from Nasopharyngeal swab specimens and should not be used as a sole basis for treatment. Nasal washings and aspirates are unacceptable for Xpert Xpress SARS-CoV-2/FLU/RSV testing.  Fact Sheet for Patients: bloggercourse.com  Fact Sheet for Healthcare Providers: seriousbroker.it  This test is not yet approved or cleared by the [place]  FDA and has been authorized for detection and/or diagnosis of SARS-CoV-2 by FDA under an Emergency Use Authorization (EUA). This EUA will remain in effect (meaning this test can be used) for the duration of the COVID-19 declaration under Section 564(b)(1) of the Act, 21 U.S.C. section 360bbb-3(b)(1), unless the authorization is terminated or revoked.     Resp Syncytial Virus by PCR NEGATIVE NEGATIVE Final    Comment: (NOTE) Fact Sheet for Patients: bloggercourse.com  Fact Sheet for Healthcare  Providers: seriousbroker.it  This test is not yet approved or cleared by the [place]  FDA and has been authorized for detection and/or diagnosis of SARS-CoV-2 by FDA under an Emergency Use Authorization (EUA). This EUA will remain in effect (meaning this test can be used) for the duration of the COVID-19 declaration under Section 564(b)(1) of the Act, 21 U.S.C. section 360bbb-3(b)(1), unless the authorization is terminated or revoked.  Performed at [facility], [address], [place], [place] [address]   Blood Culture (routine x 2)     Status: None (Preliminary result)   Collection Time: 11/07/23 10:50 AM   Specimen: BLOOD  Result Value Ref Range Status   Specimen Description BLOOD LEFT ANTECUBITAL  Final   Special Requests   Final    BOTTLES DRAWN AEROBIC AND ANAEROBIC Blood Culture adequate volume   Culture   Final    NO GROWTH 4 DAYS Performed at [facility], [address], [place], [place] [address]    Report Status PENDING  Incomplete  Blood Culture (routine x 2)     Status: None (Preliminary result)   Collection Time: 11/07/23  10:55 AM   Specimen: BLOOD RIGHT HAND  Result Value Ref Range Status   Specimen Description BLOOD RIGHT HAND  Final   Special Requests   Final    BOTTLES DRAWN AEROBIC ONLY Blood Culture results may not be optimal due to an inadequate volume of blood received in culture bottles   Culture   Final    NO GROWTH 4 DAYS Performed at [facility], [address] [place], [place] [address]    Report Status PENDING  Incomplete  Urine Culture (for pregnant, neutropenic or urologic patients or patients with an indwelling urinary catheter)     Status: Abnormal   Collection Time: 11/08/23  1:51 PM   Specimen: Urine, Clean Catch  Result Value Ref Range Status   Specimen Description URINE, CLEAN CATCH  Final   Special Requests   Final    NONE Performed at [facility], [address]  [place], [place] [address]    Culture MULTIPLE SPECIES PRESENT, SUGGEST RECOLLECTION (A)  Final   Report Status 11/09/2023 FINAL  Final         Radiology Studies: No results found.       Scheduled Meds:   stroke: early stages of recovery book   Does not apply Once   cholecalciferol   1,000 Units Oral Daily   cyanocobalamin   1,000 mcg Oral Daily   diclofenac Sodium  2 g Topical QID   divalproex  125 mg Oral Q12H   donepezil   10 mg Oral QHS   feeding supplement  1 Container Oral TID BM   lidocaine   2 patch Transdermal Q24H   LORazepam   1 mg Oral BID   memantine   5 mg Oral Daily   metoprolol  tartrate  12.5 mg Oral BID   mirabegron  ER  50 mg Oral Daily   multivitamin with minerals  1 tablet Oral Daily   QUEtiapine  12.5 mg Oral QHS   Rivaroxaban   15 mg Oral Q supper   Continuous Infusions:  cefTRIAXone  (ROCEPHIN )  IV 1 g (11/11/23 1403)     LOS: 3 days     Lebron JINNY Cage, MD [facility] Available via Epic secure chat 7am-7pm After these hours, please refer to coverage provider listed on [email or web address] 11/11/2023, 3:15 PM

## 2023-11-12 DIAGNOSIS — F0390 Unspecified dementia without behavioral disturbance: Secondary | ICD-10-CM | POA: Diagnosis not present

## 2023-11-12 DIAGNOSIS — Z515 Encounter for palliative care: Secondary | ICD-10-CM | POA: Diagnosis not present

## 2023-11-12 DIAGNOSIS — Z7189 Other specified counseling: Secondary | ICD-10-CM | POA: Diagnosis not present

## 2023-11-12 LAB — CULTURE, BLOOD (ROUTINE X 2)
Culture: NO GROWTH
Culture: NO GROWTH
Special Requests: ADEQUATE

## 2023-11-12 NOTE — Progress Notes (Signed)
 PROGRESS NOTE    Christine [name]  FMW:[id number] DOB: Oct 03, 1933 DOA: 11/07/2023 PCP: Shayne Anes, MD    Brief Narrative:  Christine [name] is a 89 y.o. female OA, GERD, HLD, HTN, IDA, PAF dementia who presents to ED concern for unwitnessed fall.  Patient with a history of dementia.  History is limited.  EMS stating that SNF staff walked into patient's room and she was sitting on the ground and complaining of left leg/hip pain.  Found to have a UTI and severe arthritis.  Currently PT recommending skilled nursing facility.  Palliative care consult for goals of care with family's blessing   Assessment and Plan: Falls Suspect related to UTI in a setting of dementia PT OT eval-SNF From memory care unit at the facility [facility] consulted)  Aphasia Resolved MRI without stroke but does show severe white matter changes, atrophy   UTI UA with large leukocytes, rare bacteria, greater than 50 WBC UCx growing multiple species Completed 5 days of IV Rocephin   History of osteoarthritis Lidocaine  patch and Voltaren gel  Dementia Continue Namenda , Aricept , quetiapine dose decreased to 12.5 to avoid excessive sedation Depakote continued    DVT prophylaxis:  Rivaroxaban  (XARELTO ) tablet 15 mg    Code Status: Limited: Do not attempt resuscitation (DNR) -DNR-LIMITED -Do Not Intubate/DNI  Family Communication: Daughter at bedside  Disposition Plan:  Level of care: Med-Surg Status is: Inpatient    Consultants:  Palliative care   Subjective: Patient appears comfortable, no new complaints.  Daughter expressed frustration about the delay in SNF discharge.  Discussed extensively with daughter about the process.  Objective: Vitals:   11/11/23 2113 11/11/23 2228 11/12/23 0550 11/12/23 0844  BP: 136/70 118/69 (!) 161/70 138/71  Pulse: 82 70 76 84  Resp: 16 18 18 18   Temp:  97.7 F (36.5 C) (!) 97.4 F (36.3 C) 98.3 F (36.8 C)  TempSrc:  Oral Oral Oral  SpO2: 100% 99% 100% 100%   Weight:      Height:        Intake/Output Summary (Last 24 hours) at 11/12/2023 1640 Last data filed at 11/11/2023 2115 Gross per 24 hour  Intake 560 ml  Output --  Net 560 ml   Filed Weights   11/07/23 1033  Weight: 56.4 kg    Examination: General: NAD, elderly Cardiovascular: S1, S2 present Respiratory: CTAB Abdomen: Soft, nontender, nondistended, bowel sounds present Musculoskeletal: No bilateral pedal edema noted Skin: Normal Psychiatry: Normal mood      Data Reviewed: I have personally reviewed following labs and imaging studies  CBC: Recent Labs  Lab 11/06/23 0937 11/07/23 1050 11/09/23 0531 11/10/23 0448  WBC 6.4 8.4 8.5 6.7  NEUTROABS 4.0 6.3  --   --   HGB 11.1* 11.1* 10.7* 10.5*  HCT 35.5* 34.7* 32.6* 32.1*  MCV 91.5 91.6 88.8 89.2  PLT 135* 160 142* 146*   Basic Metabolic Panel: Recent Labs  Lab 11/06/23 0937 11/07/23 1050 11/09/23 0531 11/10/23 0448  NA 140 141 140 139  K 4.3 4.3 4.2 3.5  CL 104 105 102 102  CO2 24 24 23 23   GLUCOSE 108* 125* 123* 109*  BUN 22 19 23  27*  CREATININE 1.01* 1.13* 1.17* 1.05*  CALCIUM  9.5 9.0 8.8* 8.9   GFR: Estimated Creatinine Clearance (by C-G formula based on SCr of 1.05 mg/dL (H)) Female: 32 mL/min (A) Female: 38.8 mL/min (A) Liver Function Tests: Recent Labs  Lab 11/07/23 1050  AST 19  ALT 18  ALKPHOS 66  BILITOT 1.1  PROT 7.1  ALBUMIN 3.5   No results for input(s): LIPASE, AMYLASE in the last 168 hours. No results for input(s): AMMONIA in the last 168 hours. Coagulation Profile: Recent Labs  Lab 11/07/23 1050  INR 1.1   Cardiac Enzymes: No results for input(s): CKTOTAL, CKMB, CKMBINDEX, TROPONINI in the last 168 hours. BNP (last 3 results) No results for input(s): PROBNP in the last 8760 hours. HbA1C: No results for input(s): HGBA1C in the last 72 hours.  CBG: No results for input(s): GLUCAP in the last 168 hours. Lipid Profile: No results for input(s):  CHOL, HDL, LDLCALC, TRIG, CHOLHDL, LDLDIRECT in the last 72 hours.  Thyroid  Function Tests: No results for input(s): TSH, T4TOTAL, FREET4, T3FREE, THYROIDAB in the last 72 hours. Anemia Panel: No results for input(s): VITAMINB12, FOLATE, FERRITIN, TIBC, IRON, RETICCTPCT in the last 72 hours. Sepsis Labs: Recent Labs  Lab 11/07/23 1112 11/07/23 1328  LATICACIDVEN 2.0* 0.9    Recent Results (from the past 240 hours)  Resp panel by RT-PCR (RSV, Flu A&B, Covid) Anterior Nasal Swab     Status: None   Collection Time: 11/07/23 10:50 AM   Specimen: Anterior Nasal Swab  Result Value Ref Range Status   SARS Coronavirus 2 by RT PCR NEGATIVE NEGATIVE Final   Influenza A by PCR NEGATIVE NEGATIVE Final   Influenza B by PCR NEGATIVE NEGATIVE Final    Comment: (NOTE) The Xpert Xpress SARS-CoV-2/FLU/RSV plus assay is intended as an aid in the diagnosis of influenza from Nasopharyngeal swab specimens and should not be used as a sole basis for treatment. Nasal washings and aspirates are unacceptable for Xpert Xpress SARS-CoV-2/FLU/RSV testing.  Fact Sheet for Patients: bloggercourse.com  Fact Sheet for Healthcare Providers: seriousbroker.it  This test is not yet approved or cleared by the [place]  FDA and has been authorized for detection and/or diagnosis of SARS-CoV-2 by FDA under an Emergency Use Authorization (EUA). This EUA will remain in effect (meaning this test can be used) for the duration of the COVID-19 declaration under Section 564(b)(1) of the Act, 21 U.S.C. section 360bbb-3(b)(1), unless the authorization is terminated or revoked.     Resp Syncytial Virus by PCR NEGATIVE NEGATIVE Final    Comment: (NOTE) Fact Sheet for Patients: bloggercourse.com  Fact Sheet for Healthcare Providers: seriousbroker.it  This test is not yet approved or  cleared by the [place]  FDA and has been authorized for detection and/or diagnosis of SARS-CoV-2 by FDA under an Emergency Use Authorization (EUA). This EUA will remain in effect (meaning this test can be used) for the duration of the COVID-19 declaration under Section 564(b)(1) of the Act, 21 U.S.C. section 360bbb-3(b)(1), unless the authorization is terminated or revoked.  Performed at [facility], [address] [place], [place] [address]   Blood Culture (routine x 2)     Status: None   Collection Time: 11/07/23 10:50 AM   Specimen: BLOOD  Result Value Ref Range Status   Specimen Description BLOOD LEFT ANTECUBITAL  Final   Special Requests   Final    BOTTLES DRAWN AEROBIC AND ANAEROBIC Blood Culture adequate volume   Culture   Final    NO GROWTH 5 DAYS Performed at [facility], [address] [place], [place] [address]    Report Status 11/12/2023 FINAL  Final  Blood Culture (routine x 2)     Status: None   Collection Time: 11/07/23 10:55 AM   Specimen: BLOOD RIGHT HAND  Result Value  Ref Range Status   Specimen Description BLOOD RIGHT HAND  Final   Special Requests   Final    BOTTLES DRAWN AEROBIC ONLY Blood Culture results may not be optimal due to an inadequate volume of blood received in culture bottles   Culture   Final    NO GROWTH 5 DAYS Performed at [facility], [address], [place], [place] [address]    Report Status 11/12/2023 FINAL  Final  Urine Culture (for pregnant, neutropenic or urologic patients or patients with an indwelling urinary catheter)     Status: Abnormal   Collection Time: 11/08/23  1:51 PM   Specimen: Urine, Clean Catch  Result Value Ref Range Status   Specimen Description URINE, CLEAN CATCH  Final   Special Requests   Final    NONE Performed at [facility], [address] [place], [place] [address]    Culture MULTIPLE SPECIES PRESENT, SUGGEST RECOLLECTION (A)  Final   Report Status 11/09/2023 FINAL  Final          Radiology Studies: No results found.       Scheduled Meds:   stroke: early stages of recovery book   Does not apply Once   cholecalciferol   1,000 Units Oral Daily   cyanocobalamin   1,000 mcg Oral Daily   diclofenac Sodium  2 g Topical QID   divalproex  125 mg Oral Q12H   donepezil   10 mg Oral QHS   feeding supplement  1 Container Oral TID BM   lidocaine   2 patch Transdermal Q24H   LORazepam   1 mg Oral BID   memantine   5 mg Oral Daily   metoprolol  tartrate  12.5 mg Oral BID   mirabegron  ER  50 mg Oral Daily   multivitamin with minerals  1 tablet Oral Daily   QUEtiapine  12.5 mg Oral QHS   Rivaroxaban   15 mg Oral Q supper   Continuous Infusions:  cefTRIAXone  (ROCEPHIN )  IV 1 g (11/12/23 1424)     LOS: 4 days     Lebron JINNY Cage, MD [facility] Available via Epic secure chat 7am-7pm After these hours, please refer to coverage provider listed on [email or web address] 11/12/2023, 4:40 PM

## 2023-11-12 NOTE — TOC Progression Note (Signed)
 Transition of Care [facility]) - Progression Note    Patient Details  Name: Christine [name] MRN: [id number] Date of Birth: 03-21-1933  [facility] CM/SW Contact  Lakechia Nay [name], [place] Phone Number: 11/12/2023, 10:16 AM  Clinical Narrative:    CSW spoke with pt daughter, [name], on the phone regarding SNF bed offers. CSW explained that [facility] (pt first choice) had accepted pt along with [facility], [facility], & [facility]. [name] stated they would like to move forward with [facility]. CSW initiated insurance auth process with [employer]. [employer] asked for a call back number, CSW provided work cell phone number. [employer] to call with a decision. CSW will continue to follow.    Expected Discharge Plan: Skilled Nursing Facility Barriers to Discharge: Continued Medical Work up, English As A Second Language Teacher, SNF Pending bed offer               Expected Discharge Plan and Services       Living arrangements for the past 2 months: Independent Living Facility                                       Social Drivers of Health (SDOH) Interventions SDOH Screenings   Food Insecurity: No Food Insecurity (11/07/2023)  Housing: Low Risk  (11/07/2023)  Transportation Needs: No Transportation Needs (11/07/2023)  Utilities: Not At Risk (11/07/2023)  Tobacco Use: Low Risk  (11/10/2023)    Readmission Risk Interventions     No data to display

## 2023-11-12 NOTE — Plan of Care (Signed)
  Problem: Education: Goal: Knowledge of disease or condition will improve Outcome: Progressing Goal: Knowledge of patient specific risk factors will improve (DELETE if not current risk factor) Outcome: Progressing   Problem: Ischemic Stroke/TIA Tissue Perfusion: Goal: Complications of ischemic stroke/TIA will be minimized Outcome: Progressing   Problem: Coping: Goal: Will identify appropriate support needs Outcome: Progressing   Problem: Self-Care: Goal: Ability to participate in self-care as condition permits will improve Outcome: Progressing   Problem: Nutrition: Goal: Risk of aspiration will decrease Outcome: Progressing   Problem: Coping: Goal: Level of anxiety will decrease Outcome: Progressing   Problem: Pain Managment: Goal: General experience of comfort will improve and/or be controlled Outcome: Progressing   Problem: Safety: Goal: Ability to remain free from injury will improve Outcome: Progressing   Problem: Skin Integrity: Goal: Risk for impaired skin integrity will decrease Outcome: Progressing

## 2023-11-12 NOTE — Care Management Important Message (Signed)
 Important Message  Patient Details  Name: Christine [name] MRN: [id number] Date of Birth: 1933/10/17   Important Message Given:  Yes - Medicare IM     Jon Cruel 11/12/2023, 4:27 PM

## 2023-11-12 NOTE — Progress Notes (Signed)
 Physical Therapy Treatment  Patient Details Name: Christine [name] MRN: [id number] DOB: [birth year] Today's Date: 11/12/2023   History of Present Illness Pt is an 89 y/o female who presents 11/07/2023 s/p fall at [facility] (memory care). Imaging negative for acute injury, and MRI of brain negative for acute intracranial changes. PMH signficant for TIA, HTN, dementia, osteopenia, L THA.    PT Comments  Pt progressing towards physical therapy goals. Continues to require +2 assist for safe ambulation in room. Pt's tolerance for functional activity is improving and pt almost doubled her distance today compared to last session. Acute PT recommendations remain appropriate and unchanged. Will continue to follow and progress as able per POC.     If plan is discharge home, recommend the following: Two people to help with walking and/or transfers;Two people to help with bathing/dressing/bathroom;Assistance with cooking/housework;Direct supervision/assist for medications management;Direct supervision/assist for financial management;Assist for transportation;Supervision due to cognitive status;Help with stairs or ramp for entrance   Can travel by private vehicle     No  Equipment Recommendations  Wheelchair (measurements PT);Wheelchair cushion (measurements PT);Rolling walker (2 wheels)    Recommendations for Other Services       Precautions / Restrictions Precautions Precautions: Fall Recall of Precautions/Restrictions: Impaired Restrictions Weight Bearing Restrictions Per Provider Order: No     Mobility  Bed Mobility               General bed mobility comments: Pt was received sitting up in the recliner.    Transfers Overall transfer level: Needs assistance Equipment used: Rolling walker (2 wheels) Transfers: Sit to/from Stand, Bed to chair/wheelchair/BSC Sit to Stand: Mod assist, +2 physical assistance, +2 safety/equipment   Step pivot transfers: Mod assist, +2 physical  assistance, +2 safety/equipment       General transfer comment: +2 assist for power up to full stand from recliner and BSC. Pt required increased multimodal cues for transition recliner>BSC.    Ambulation/Gait Ambulation/Gait assistance: Min assist, Mod assist, +2 safety/equipment, +2 physical assistance Gait Distance (Feet): 25 Feet (25'+20') Assistive device: Rolling walker (2 wheels) Gait Pattern/deviations: Step-through pattern, Decreased stride length, Trunk flexed, Narrow base of support Gait velocity: Decreased Gait velocity interpretation: <1.31 ft/sec, indicative of household ambulator   General Gait Details: Posterior bias and up to mod assist at times for balance and walker management. +2 for balance support and safety, and daughter pushing chair behind us  for added safety.   Stairs             Wheelchair Mobility     Tilt Bed    Modified Rankin (Stroke Patients Only)       Balance Overall balance assessment: Needs assistance Sitting-balance support: Bilateral upper extremity supported, Feet supported Sitting balance-Leahy Scale: Poor Sitting balance - Comments: Pt requires assistance to maintain sitting balance without posterior support. Postural control: Posterior lean Standing balance support: Bilateral upper extremity supported, During functional activity, Reliant on assistive device for balance Standing balance-Leahy Scale: Poor Standing balance comment: dependent on RW and external support                            Communication Communication Communication: Impaired Factors Affecting Communication: Reduced clarity of speech;Difficulty expressing self  Cognition Arousal: Alert Behavior During Therapy: WFL for tasks assessed/performed   PT - Cognitive impairments: History of cognitive impairments  Following commands: Impaired Following commands impaired: Follows one step commands inconsistently     Cueing Cueing Techniques: Verbal cues, Gestural cues, Tactile cues, Visual cues  Exercises General Exercises - Lower Extremity Ankle Circles/Pumps: 5 reps Long Arc Quad: 5 reps    General Comments        Pertinent Vitals/Pain Pain Assessment Pain Assessment: Faces Faces Pain Scale: Hurts a little bit Pain Descriptors / Indicators: Discomfort, Grimacing, Guarding Pain Intervention(s): Limited activity within patient's tolerance, Monitored during session, Repositioned    Home Living                          Prior Function            PT Goals (current goals can now be found in the care plan section) Acute Rehab PT Goals Patient Stated Goal: Decrease pain PT Goal Formulation: With patient/family Time For Goal Achievement: 11/22/23 Potential to Achieve Goals: Fair Progress towards PT goals: Progressing toward goals    Frequency    Min 2X/week      PT Plan      Co-evaluation              AM-PAC PT 6 Clicks Mobility   Outcome Measure  Help needed turning from your back to your side while in a flat bed without using bedrails?: Total Help needed moving from lying on your back to sitting on the side of a flat bed without using bedrails?: Total Help needed moving to and from a bed to a chair (including a wheelchair)?: Total Help needed standing up from a chair using your arms (e.g., wheelchair or bedside chair)?: Total Help needed to walk in hospital room?: Total Help needed climbing 3-5 steps with a railing? : Total 6 Click Score: 6    End of Session Equipment Utilized During Treatment: Gait belt Activity Tolerance: Patient tolerated treatment well Patient left: in chair;with call bell/phone within reach;with chair alarm set;with family/visitor present Nurse Communication: Mobility status PT Visit Diagnosis: Pain;Difficulty in walking, not elsewhere classified (R26.2);History of falling (Z91.81);Muscle weakness (generalized) (M62.81) Pain -  Right/Left: Left Pain - part of body: Hip     Time: 8893-8858 PT Time Calculation (min) (ACUTE ONLY): 35 min  Charges:    $Gait Training: 23-37 mins PT General Charges $$ ACUTE PT VISIT: 1 Visit                     Christine [name], PT, DPT Acute Rehabilitation Services Secure Chat Preferred Office: [phone]    Christine [name] 11/12/2023, 1:55 PM

## 2023-11-13 DIAGNOSIS — Z7189 Other specified counseling: Secondary | ICD-10-CM | POA: Diagnosis not present

## 2023-11-13 DIAGNOSIS — Z515 Encounter for palliative care: Secondary | ICD-10-CM | POA: Diagnosis not present

## 2023-11-13 DIAGNOSIS — F0390 Unspecified dementia without behavioral disturbance: Secondary | ICD-10-CM | POA: Diagnosis not present

## 2023-11-13 MED ORDER — QUETIAPINE 12.5 MG HALF TABLET
25.0000 mg | ORAL_TABLET | Freq: Every day | ORAL | Status: DC
  Administered 2023-11-13 – 2023-11-14 (×2): 25 mg via ORAL
  Filled 2023-11-13 (×2): qty 2

## 2023-11-13 NOTE — Plan of Care (Signed)

## 2023-11-13 NOTE — Plan of Care (Signed)
   Problem: Safety: Goal: Ability to remain free from injury will improve Outcome: Progressing   Problem: Skin Integrity: Goal: Risk for impaired skin integrity will decrease Outcome: Progressing

## 2023-11-13 NOTE — TOC Progression Note (Addendum)
 Transition of Care [facility]) - Progression Note    Patient Details  Name: Christine [name] MRN: [id number] Date of Birth: 01-05-1934  [facility]) CM/SW Contact  Christine [name], LCSWA Phone Number: 11/13/2023, 9:41 AM  Clinical Narrative:     SW informed via VM from [facility], pt has been approved for PTAR #[id number], but SNF [facility] has been sent to medical review.   Update 112pm SW informed via VM from [facility], P2P offered. Due by 11/9 by 12pm Contact: [phone]  SW updated MD via secure chat.  SW spoke with pts daughter [name] [phone]) to update. SW informed of the possibility of denial and options (appeal vs d/c back to [facility]) if it were to happen. [name] does not think pts needs can be met at [facility] due to pts current state. [name] requested to speak with the MD, to get a better understanding so she knows how to move forward.   MD made aware via secure chat.   Expected Discharge Plan: Skilled Nursing Facility Barriers to Discharge: Continued Medical Work up, English As A Second Language Teacher, SNF Pending bed offer     Expected Discharge Plan and Services       Living arrangements for the past 2 months: Independent Living Facility                     Social Drivers of Health (SDOH) Interventions SDOH Screenings   Food Insecurity: No Food Insecurity (11/07/2023)  Housing: Low Risk  (11/07/2023)  Transportation Needs: No Transportation Needs (11/07/2023)  Utilities: Not At Risk (11/07/2023)  Tobacco Use: Low Risk  (11/10/2023)    Readmission Risk Interventions     No data to display

## 2023-11-13 NOTE — Plan of Care (Signed)
Pt is confused 

## 2023-11-13 NOTE — Progress Notes (Addendum)
 PROGRESS NOTE    Christine [name]  FMW:[id number] DOB: 1933/05/16 DOA: 11/07/2023 PCP: Shayne Anes, MD    Brief Narrative:  Christine [name] is a 89 y.o. female OA, GERD, HLD, HTN, IDA, PAF dementia who presents to ED concern for unwitnessed fall.  Patient with a history of dementia.  History is limited.  EMS stating that SNF staff walked into patient's room and she was sitting on the ground and complaining of left leg/hip pain.  Found to have a UTI and severe arthritis.  Currently PT recommending skilled nursing facility.  Palliative care consult for goals of care with family's blessing.  Insurance requiring peer to peer on 11/13/2023   Assessment and Plan: Falls Suspect related to UTI in a setting of dementia PT OT eval-SNF From memory care unit at the facility [facility] consulted)  Aphasia Resolved MRI without stroke but does show severe white matter changes, atrophy   UTI UA with large leukocytes, rare bacteria, greater than 50 WBC UCx growing multiple species Completed 5 days of IV Rocephin   History of osteoarthritis Lidocaine  patch and Voltaren gel  Dementia Continue Namenda , Aricept , quetiapine  Depakote continued    DVT prophylaxis:  Rivaroxaban  (XARELTO ) tablet 15 mg    Code Status: Limited: Do not attempt resuscitation (DNR) -DNR-LIMITED -Do Not Intubate/DNI  Family Communication: Daughter at bedside  Disposition Plan:  Level of care: Med-Surg Status is: Inpatient    Consultants:  Palliative care   Subjective: Patient denies any new complaints.  Sitting in chair at bedside, enjoyed her breakfast. Insurance requiring peer to peer on 11/13/2023    Objective: Vitals:   11/12/23 1744 11/12/23 2043 11/13/23 0615 11/13/23 0920  BP: 130/60 (!) 132/56 (!) 156/73 (!) 136/59  Pulse: 75 79 83 77  Resp: 16   16  Temp: 98.2 F (36.8 C) 97.9 F (36.6 C) 98.6 F (37 C) 98.1 F (36.7 C)  TempSrc: Oral Oral  Oral  SpO2: 99% 99% 100% 100%  Weight:      Height:         Intake/Output Summary (Last 24 hours) at 11/13/2023 1535 Last data filed at 11/13/2023 1247 Gross per 24 hour  Intake 600 ml  Output --  Net 600 ml   Filed Weights   11/07/23 1033  Weight: 56.4 kg    Examination: General: NAD, elderly Cardiovascular: S1, S2 present Respiratory: CTAB Abdomen: Soft, nontender, nondistended, bowel sounds present Musculoskeletal: No bilateral pedal edema noted Skin: Normal Psychiatry: Normal mood      Data Reviewed: I have personally reviewed following labs and imaging studies  CBC: Recent Labs  Lab 11/07/23 1050 11/09/23 0531 11/10/23 0448  WBC 8.4 8.5 6.7  NEUTROABS 6.3  --   --   HGB 11.1* 10.7* 10.5*  HCT 34.7* 32.6* 32.1*  MCV 91.6 88.8 89.2  PLT 160 142* 146*   Basic Metabolic Panel: Recent Labs  Lab 11/07/23 1050 11/09/23 0531 11/10/23 0448  NA 141 140 139  K 4.3 4.2 3.5  CL 105 102 102  CO2 24 23 23   GLUCOSE 125* 123* 109*  BUN 19 23 27*  CREATININE 1.13* 1.17* 1.05*  CALCIUM  9.0 8.8* 8.9   GFR: Estimated Creatinine Clearance (by C-G formula based on SCr of 1.05 mg/dL (H)) Female: 32 mL/min (A) Female: 38.8 mL/min (A) Liver Function Tests: Recent Labs  Lab 11/07/23 1050  AST 19  ALT 18  ALKPHOS 66  BILITOT 1.1  PROT 7.1  ALBUMIN 3.5   No results for input(s):  LIPASE, AMYLASE in the last 168 hours. No results for input(s): AMMONIA in the last 168 hours. Coagulation Profile: Recent Labs  Lab 11/07/23 1050  INR 1.1   Cardiac Enzymes: No results for input(s): CKTOTAL, CKMB, CKMBINDEX, TROPONINI in the last 168 hours. BNP (last 3 results) No results for input(s): PROBNP in the last 8760 hours. HbA1C: No results for input(s): HGBA1C in the last 72 hours.  CBG: No results for input(s): GLUCAP in the last 168 hours. Lipid Profile: No results for input(s): CHOL, HDL, LDLCALC, TRIG, CHOLHDL, LDLDIRECT in the last 72 hours.  Thyroid  Function Tests: No results for  input(s): TSH, T4TOTAL, FREET4, T3FREE, THYROIDAB in the last 72 hours. Anemia Panel: No results for input(s): VITAMINB12, FOLATE, FERRITIN, TIBC, IRON, RETICCTPCT in the last 72 hours. Sepsis Labs: Recent Labs  Lab 11/07/23 1112 11/07/23 1328  LATICACIDVEN 2.0* 0.9    Recent Results (from the past 240 hours)  Resp panel by RT-PCR (RSV, Flu A&B, Covid) Anterior Nasal Swab     Status: None   Collection Time: 11/07/23 10:50 AM   Specimen: Anterior Nasal Swab  Result Value Ref Range Status   SARS Coronavirus 2 by RT PCR NEGATIVE NEGATIVE Final   Influenza A by PCR NEGATIVE NEGATIVE Final   Influenza B by PCR NEGATIVE NEGATIVE Final    Comment: (NOTE) The Xpert Xpress SARS-CoV-2/FLU/RSV plus assay is intended as an aid in the diagnosis of influenza from Nasopharyngeal swab specimens and should not be used as a sole basis for treatment. Nasal washings and aspirates are unacceptable for Xpert Xpress SARS-CoV-2/FLU/RSV testing.  Fact Sheet for Patients: bloggercourse.com  Fact Sheet for Healthcare Providers: seriousbroker.it  This test is not yet approved or cleared by the [place]  FDA and has been authorized for detection and/or diagnosis of SARS-CoV-2 by FDA under an Emergency Use Authorization (EUA). This EUA will remain in effect (meaning this test can be used) for the duration of the COVID-19 declaration under Section 564(b)(1) of the Act, 21 U.S.C. section 360bbb-3(b)(1), unless the authorization is terminated or revoked.     Resp Syncytial Virus by PCR NEGATIVE NEGATIVE Final    Comment: (NOTE) Fact Sheet for Patients: bloggercourse.com  Fact Sheet for Healthcare Providers: seriousbroker.it  This test is not yet approved or cleared by the [place]  FDA and has been authorized for detection and/or diagnosis of SARS-CoV-2 by FDA under an  Emergency Use Authorization (EUA). This EUA will remain in effect (meaning this test can be used) for the duration of the COVID-19 declaration under Section 564(b)(1) of the Act, 21 U.S.C. section 360bbb-3(b)(1), unless the authorization is terminated or revoked.  Performed at [facility], [address] [place], [place] [address]   Blood Culture (routine x 2)     Status: None   Collection Time: 11/07/23 10:50 AM   Specimen: BLOOD  Result Value Ref Range Status   Specimen Description BLOOD LEFT ANTECUBITAL  Final   Special Requests   Final    BOTTLES DRAWN AEROBIC AND ANAEROBIC Blood Culture adequate volume   Culture   Final    NO GROWTH 5 DAYS Performed at [facility], [address] [place], [place] [address]    Report Status 11/12/2023 FINAL  Final  Blood Culture (routine x 2)     Status: None   Collection Time: 11/07/23 10:55 AM   Specimen: BLOOD RIGHT HAND  Result Value Ref Range Status   Specimen Description BLOOD RIGHT HAND  Final  Special Requests   Final    BOTTLES DRAWN AEROBIC ONLY Blood Culture results may not be optimal due to an inadequate volume of blood received in culture bottles   Culture   Final    NO GROWTH 5 DAYS Performed at [facility], [address] [place], [place] [address]    Report Status 11/12/2023 FINAL  Final  Urine Culture (for pregnant, neutropenic or urologic patients or patients with an indwelling urinary catheter)     Status: Abnormal   Collection Time: 11/08/23  1:51 PM   Specimen: Urine, Clean Catch  Result Value Ref Range Status   Specimen Description URINE, CLEAN CATCH  Final   Special Requests   Final    NONE Performed at [facility], [address], [place], [place] [address]    Culture MULTIPLE SPECIES PRESENT, SUGGEST RECOLLECTION (A)  Final   Report Status 11/09/2023 FINAL  Final         Radiology Studies: No results found.       Scheduled Meds:   stroke: early stages of recovery  book   Does not apply Once   cholecalciferol   1,000 Units Oral Daily   cyanocobalamin   1,000 mcg Oral Daily   diclofenac Sodium  2 g Topical QID   divalproex  125 mg Oral Q12H   donepezil   10 mg Oral QHS   feeding supplement  1 Container Oral TID BM   lidocaine   2 patch Transdermal Q24H   LORazepam   1 mg Oral BID   memantine   5 mg Oral Daily   metoprolol  tartrate  12.5 mg Oral BID   mirabegron  ER  50 mg Oral Daily   multivitamin with minerals  1 tablet Oral Daily   QUEtiapine  12.5 mg Oral QHS   Rivaroxaban   15 mg Oral Q supper   Continuous Infusions:     LOS: 5 days     Lebron JINNY Cage, MD [facility] Available via Epic secure chat 7am-7pm After these hours, please refer to coverage provider listed on [email or web address] 11/13/2023, 3:35 PM

## 2023-11-14 DIAGNOSIS — F0390 Unspecified dementia without behavioral disturbance: Secondary | ICD-10-CM | POA: Diagnosis not present

## 2023-11-14 DIAGNOSIS — Z7189 Other specified counseling: Secondary | ICD-10-CM | POA: Diagnosis not present

## 2023-11-14 DIAGNOSIS — Z515 Encounter for palliative care: Secondary | ICD-10-CM | POA: Diagnosis not present

## 2023-11-14 NOTE — Plan of Care (Signed)
  Problem: Self-Care: Goal: Verbalization of feelings and concerns over difficulty with self-care will improve Outcome: Not Applicable   Problem: Nutrition: Goal: Risk of aspiration will decrease Outcome: Progressing Goal: Dietary intake will improve Outcome: Progressing   Problem: Clinical Measurements: Goal: Will remain free from infection Outcome: Progressing Goal: Respiratory complications will improve Outcome: Progressing   Problem: Activity: Goal: Risk for activity intolerance will decrease Outcome: Progressing

## 2023-11-14 NOTE — TOC Progression Note (Signed)
 Transition of Care [facility]) - Progression Note    Patient Details  Name: Christine [name] MRN: [id number] Date of Birth: [birth year]  Transition of Care [facility]) CM/SW Contact  Cena Ligas, [place] Phone Number: 11/14/2023, 12:19 PM  Clinical Narrative:     Patients insurance [facility]ara has been approved for the next 7 days, [id number].   LCSW reached out to [facility], they are not able to accept her today but should be able to tomorrow.   Expected Discharge Plan: Skilled Nursing Facility Barriers to Discharge: Continued Medical Work up, English As A Second Language Teacher, SNF Pending bed offer               Expected Discharge Plan and Services       Living arrangements for the past 2 months: Independent Living Facility                                       Social Drivers of Health (SDOH) Interventions SDOH Screenings   Food Insecurity: No Food Insecurity (11/07/2023)  Housing: Low Risk  (11/07/2023)  Transportation Needs: No Transportation Needs (11/07/2023)  Utilities: Not At Risk (11/07/2023)  Tobacco Use: Low Risk  (11/10/2023)    Readmission Risk Interventions     No data to display

## 2023-11-14 NOTE — Progress Notes (Signed)
 PROGRESS NOTE    Christine [name]  FMW:[id number] DOB: March 02, 1933 DOA: 11/07/2023 PCP: Shayne Anes, MD    Brief Narrative:  Christine [name] is a 89 y.o. female OA, GERD, HLD, HTN, IDA, PAF dementia who presents to ED concern for unwitnessed fall.  Patient with a history of dementia.  History is limited.  EMS stating that SNF staff walked into patient's room and she was sitting on the ground and complaining of left leg/hip pain.  Found to have a UTI and severe arthritis.  Currently PT recommending skilled nursing facility.  Palliative care consult for goals of care with family's blessing.  Insurance requiring peer to peer on 11/13/2023   Assessment and Plan: Falls Suspect related to UTI in a setting of dementia PT OT eval-SNF From ALF/memory care unit at the facility  Aphasia Resolved MRI without stroke but does show severe white matter changes, atrophy   UTI UA with large leukocytes, rare bacteria, greater than 50 WBC UCx growing multiple species Completed 5 days of IV Rocephin   Paroxysmal A-fib HR controlled Not on any rate controlling medication Continue PTA Xarelto   Hypertension BP stable PTA losartan  3 times weekly  History of osteoarthritis Lidocaine  patch and Voltaren gel  Dementia Continue Namenda , Aricept , quetiapine  Depakote continued    DVT prophylaxis:  Rivaroxaban  (XARELTO ) tablet 15 mg    Code Status: Limited: Do not attempt resuscitation (DNR) -DNR-LIMITED -Do Not Intubate/DNI  Family Communication: Daughter at bedside  Disposition Plan:  Level of care: Med-Surg Status is: Inpatient    Consultants:  Palliative care   Subjective: Today, patient denies any new complaints.  Discussed with daughter at bedside.  SNF approved for 7 days.  Plan for DC to SNF on 11/15/2023    Objective: Vitals:   11/13/23 2052 11/14/23 0400 11/14/23 0541 11/14/23 0912  BP: (!) 129/58 (!) 157/59 (!) 171/63 134/62  Pulse: 82 73 79 84  Resp:  18  16  Temp:   98.2 F (36.8 C)  98.7 F (37.1 C)  TempSrc:  Oral    SpO2:  99% 100% 98%  Weight:      Height:        Intake/Output Summary (Last 24 hours) at 11/14/2023 1418 Last data filed at 11/14/2023 1100 Gross per 24 hour  Intake 120 ml  Output --  Net 120 ml   Filed Weights   11/07/23 1033  Weight: 56.4 kg    Examination: General: NAD, elderly Cardiovascular: S1, S2 present Respiratory: CTAB Abdomen: Soft, nontender, nondistended, bowel sounds present Musculoskeletal: No bilateral pedal edema noted Skin: Normal Psychiatry: Normal mood      Data Reviewed: I have personally reviewed following labs and imaging studies  CBC: Recent Labs  Lab 11/09/23 0531 11/10/23 0448  WBC 8.5 6.7  HGB 10.7* 10.5*  HCT 32.6* 32.1*  MCV 88.8 89.2  PLT 142* 146*   Basic Metabolic Panel: Recent Labs  Lab 11/09/23 0531 11/10/23 0448  NA 140 139  K 4.2 3.5  CL 102 102  CO2 23 23  GLUCOSE 123* 109*  BUN 23 27*  CREATININE 1.17* 1.05*  CALCIUM  8.8* 8.9   GFR: Estimated Creatinine Clearance (by C-G formula based on SCr of 1.05 mg/dL (H)) Female: 32 mL/min (A) Female: 38.8 mL/min (A) Liver Function Tests: No results for input(s): AST, ALT, ALKPHOS, BILITOT, PROT, ALBUMIN in the last 168 hours.  No results for input(s): LIPASE, AMYLASE in the last 168 hours. No results for input(s): AMMONIA in the last 168  hours. Coagulation Profile: No results for input(s): INR, PROTIME in the last 168 hours.  Cardiac Enzymes: No results for input(s): CKTOTAL, CKMB, CKMBINDEX, TROPONINI in the last 168 hours. BNP (last 3 results) No results for input(s): PROBNP in the last 8760 hours. HbA1C: No results for input(s): HGBA1C in the last 72 hours.  CBG: No results for input(s): GLUCAP in the last 168 hours. Lipid Profile: No results for input(s): CHOL, HDL, LDLCALC, TRIG, CHOLHDL, LDLDIRECT in the last 72 hours.  Thyroid  Function Tests: No  results for input(s): TSH, T4TOTAL, FREET4, T3FREE, THYROIDAB in the last 72 hours. Anemia Panel: No results for input(s): VITAMINB12, FOLATE, FERRITIN, TIBC, IRON, RETICCTPCT in the last 72 hours. Sepsis Labs: No results for input(s): PROCALCITON, LATICACIDVEN in the last 168 hours.   Recent Results (from the past 240 hours)  Resp panel by RT-PCR (RSV, Flu A&B, Covid) Anterior Nasal Swab     Status: None   Collection Time: 11/07/23 10:50 AM   Specimen: Anterior Nasal Swab  Result Value Ref Range Status   SARS Coronavirus 2 by RT PCR NEGATIVE NEGATIVE Final   Influenza A by PCR NEGATIVE NEGATIVE Final   Influenza B by PCR NEGATIVE NEGATIVE Final    Comment: (NOTE) The Xpert Xpress SARS-CoV-2/FLU/RSV plus assay is intended as an aid in the diagnosis of influenza from Nasopharyngeal swab specimens and should not be used as a sole basis for treatment. Nasal washings and aspirates are unacceptable for Xpert Xpress SARS-CoV-2/FLU/RSV testing.  Fact Sheet for Patients: bloggercourse.com  Fact Sheet for Healthcare Providers: seriousbroker.it  This test is not yet approved or cleared by the [place]  FDA and has been authorized for detection and/or diagnosis of SARS-CoV-2 by FDA under an Emergency Use Authorization (EUA). This EUA will remain in effect (meaning this test can be used) for the duration of the COVID-19 declaration under Section 564(b)(1) of the Act, 21 U.S.C. section 360bbb-3(b)(1), unless the authorization is terminated or revoked.     Resp Syncytial Virus by PCR NEGATIVE NEGATIVE Final    Comment: (NOTE) Fact Sheet for Patients: bloggercourse.com  Fact Sheet for Healthcare Providers: seriousbroker.it  This test is not yet approved or cleared by the [place]  FDA and has been authorized for detection and/or diagnosis of SARS-CoV-2  by FDA under an Emergency Use Authorization (EUA). This EUA will remain in effect (meaning this test can be used) for the duration of the COVID-19 declaration under Section 564(b)(1) of the Act, 21 U.S.C. section 360bbb-3(b)(1), unless the authorization is terminated or revoked.  Performed at [facility], [address], [place], [place] [address]   Blood Culture (routine x 2)     Status: None   Collection Time: 11/07/23 10:50 AM   Specimen: BLOOD  Result Value Ref Range Status   Specimen Description BLOOD LEFT ANTECUBITAL  Final   Special Requests   Final    BOTTLES DRAWN AEROBIC AND ANAEROBIC Blood Culture adequate volume   Culture   Final    NO GROWTH 5 DAYS Performed at [facility], [address] [place], [place] [address]    Report Status 11/12/2023 FINAL  Final  Blood Culture (routine x 2)     Status: None   Collection Time: 11/07/23 10:55 AM   Specimen: BLOOD RIGHT HAND  Result Value Ref Range Status   Specimen Description BLOOD RIGHT HAND  Final   Special Requests   Final    BOTTLES DRAWN AEROBIC ONLY Blood Culture results may not  be optimal due to an inadequate volume of blood received in culture bottles   Culture   Final    NO GROWTH 5 DAYS Performed at [facility], [address], [place], [place] [address]    Report Status 11/12/2023 FINAL  Final  Urine Culture (for pregnant, neutropenic or urologic patients or patients with an indwelling urinary catheter)     Status: Abnormal   Collection Time: 11/08/23  1:51 PM   Specimen: Urine, Clean Catch  Result Value Ref Range Status   Specimen Description URINE, CLEAN CATCH  Final   Special Requests   Final    NONE Performed at [facility], [address] [place], [place] [address]    Culture MULTIPLE SPECIES PRESENT, SUGGEST RECOLLECTION (A)  Final   Report Status 11/09/2023 FINAL  Final         Radiology Studies: No results found.       Scheduled Meds:   stroke: early  stages of recovery book   Does not apply Once   cholecalciferol   1,000 Units Oral Daily   cyanocobalamin   1,000 mcg Oral Daily   diclofenac Sodium  2 g Topical QID   divalproex  125 mg Oral Q12H   donepezil   10 mg Oral QHS   feeding supplement  1 Container Oral TID BM   lidocaine   2 patch Transdermal Q24H   LORazepam   1 mg Oral BID   memantine   5 mg Oral Daily   metoprolol  tartrate  12.5 mg Oral BID   mirabegron  ER  50 mg Oral Daily   multivitamin with minerals  1 tablet Oral Daily   QUEtiapine  25 mg Oral QHS   Rivaroxaban   15 mg Oral Q supper   Continuous Infusions:     LOS: 6 days     Lebron JINNY Cage, MD [facility] Available via Epic secure chat 7am-7pm After these hours, please refer to coverage provider listed on [email or web address] 11/14/2023, 2:18 PM

## 2023-11-14 NOTE — Plan of Care (Signed)
  Problem: Pain Managment: Goal: General experience of comfort will improve and/or be controlled Outcome: Progressing   Problem: Safety: Goal: Ability to remain free from injury will improve Outcome: Progressing   Problem: Skin Integrity: Goal: Risk for impaired skin integrity will decrease Outcome: Progressing

## 2023-11-15 DIAGNOSIS — E785 Hyperlipidemia, unspecified: Secondary | ICD-10-CM | POA: Diagnosis not present

## 2023-11-15 DIAGNOSIS — F02811 Dementia in other diseases classified elsewhere, unspecified severity, with agitation: Secondary | ICD-10-CM | POA: Diagnosis not present

## 2023-11-15 DIAGNOSIS — R2681 Unsteadiness on feet: Secondary | ICD-10-CM | POA: Diagnosis not present

## 2023-11-15 DIAGNOSIS — I82401 Acute embolism and thrombosis of unspecified deep veins of right lower extremity: Secondary | ICD-10-CM | POA: Diagnosis not present

## 2023-11-15 DIAGNOSIS — M6281 Muscle weakness (generalized): Secondary | ICD-10-CM | POA: Diagnosis not present

## 2023-11-15 DIAGNOSIS — F419 Anxiety disorder, unspecified: Secondary | ICD-10-CM | POA: Diagnosis not present

## 2023-11-15 DIAGNOSIS — N390 Urinary tract infection, site not specified: Secondary | ICD-10-CM | POA: Diagnosis not present

## 2023-11-15 DIAGNOSIS — R600 Localized edema: Secondary | ICD-10-CM | POA: Diagnosis not present

## 2023-11-15 DIAGNOSIS — E43 Unspecified severe protein-calorie malnutrition: Secondary | ICD-10-CM | POA: Diagnosis not present

## 2023-11-15 DIAGNOSIS — R296 Repeated falls: Secondary | ICD-10-CM | POA: Diagnosis not present

## 2023-11-15 DIAGNOSIS — R2243 Localized swelling, mass and lump, lower limb, bilateral: Secondary | ICD-10-CM | POA: Diagnosis not present

## 2023-11-15 DIAGNOSIS — Z515 Encounter for palliative care: Secondary | ICD-10-CM | POA: Diagnosis not present

## 2023-11-15 DIAGNOSIS — I10 Essential (primary) hypertension: Secondary | ICD-10-CM | POA: Diagnosis not present

## 2023-11-15 DIAGNOSIS — K219 Gastro-esophageal reflux disease without esophagitis: Secondary | ICD-10-CM | POA: Diagnosis not present

## 2023-11-15 DIAGNOSIS — R1310 Dysphagia, unspecified: Secondary | ICD-10-CM | POA: Diagnosis not present

## 2023-11-15 DIAGNOSIS — M109 Gout, unspecified: Secondary | ICD-10-CM | POA: Diagnosis not present

## 2023-11-15 DIAGNOSIS — W19XXXA Unspecified fall, initial encounter: Secondary | ICD-10-CM | POA: Diagnosis not present

## 2023-11-15 DIAGNOSIS — R278 Other lack of coordination: Secondary | ICD-10-CM | POA: Diagnosis not present

## 2023-11-15 MED ORDER — ADULT MULTIVITAMIN W/MINERALS CH: 1.0000 | ORAL_TABLET | Freq: Every day | ORAL | Status: DC

## 2023-11-15 MED ORDER — LORAZEPAM 1 MG PO TABS: 1.0000 mg | ORAL_TABLET | Freq: Two times a day (BID) | ORAL | 0 refills | Status: AC

## 2023-11-15 MED ORDER — TRAMADOL HCL 50 MG PO TABS: 50.0000 mg | ORAL_TABLET | Freq: Three times a day (TID) | ORAL | 0 refills | Status: AC | PRN

## 2023-11-15 MED ORDER — METOPROLOL TARTRATE 25 MG PO TABS: 12.5000 mg | ORAL_TABLET | Freq: Two times a day (BID) | ORAL | Status: DC

## 2023-11-15 MED ORDER — HYDROXYZINE HCL 25 MG PO TABS
25.0000 mg | ORAL_TABLET | Freq: Every evening | ORAL | Status: AC | PRN
Start: 2023-11-15 — End: ?

## 2023-11-15 MED ORDER — LIDOCAINE 5 % EX PTCH: 2.0000 | MEDICATED_PATCH | CUTANEOUS | Status: DC

## 2023-11-15 MED ORDER — DICLOFENAC SODIUM 1 % EX GEL: 2.0000 g | Freq: Four times a day (QID) | CUTANEOUS | Status: DC

## 2023-11-15 NOTE — Discharge Summary (Signed)
 Physician Discharge Summary   Patient: Christine [name] MRN: [id number] DOB: [birth year]  Admit date:     11/07/2023  Discharge date: 11/15/23  Discharge Physician: Lebron JINNY Cage   PCP: Shayne Anes, MD   Recommendations at discharge:   Follow-up with PCP  Discharge Diagnoses: Principal Problem:   Fall Active Problems:   Protein-calorie malnutrition, severe    Hospital Course: Christine [name] is a 89 y.o. female OA, GERD, HLD, HTN, IDA, PAF dementia who presents to ED concern for unwitnessed fall. Patient with a history of dementia. History is limited. EMS stating that SNF staff walked into patient's room and she was sitting on the ground and complaining of left leg/hip pain. Found to have a UTI and severe arthritis. Currently PT recommending skilled nursing facility. Palliative care consult for goals of care with family's blessing. Insurance required peer to peer on 11/14/2023, which they approved for her SNF stay.     Today, met patient sitting up in bed, denied any new complaints.  Stable to transfer to SNF for further rehab needs.     Assessment and Plan:  Falls Suspect related to UTI in a setting of dementia PT OT eval-SNF From ALF/memory care unit at the facility   Aphasia Resolved MRI without stroke but does show severe white matter changes, atrophy   UTI UA with large leukocytes, rare bacteria, greater than 50 WBC UCx growing multiple species Completed 5 days of IV Rocephin    Paroxysmal A-fib HR controlled Recently started on Lopressor , continue Continue PTA Xarelto    Hypertension BP stable Patient's BP remained stable without losartan  Hold home losartan  3 times weekly, can resume if BP uncontrolled at a lower dose   History of osteoarthritis Lidocaine  patch and Voltaren gel   Dementia Continue Namenda , Aricept , quetiapine  Continue Atarax , Depakote as needed for agitation       Consultants: Palliative care Procedures performed:  None Disposition: Skilled nursing facility Diet recommendation:  Dysphagia type 3, thin liquid    DISCHARGE MEDICATION: Allergies as of 11/15/2023       Reactions   Ambien  [zolpidem  Tartrate] Other (See Comments)    Hallucinations    Celexa [citalopram] Other (See Comments)   Per patient, made the inside and outside of skin burn.   Cymbalta [duloxetine Hcl] Other (See Comments)   Unknown reaction   Ditropan Xl [oxybutynin Chloride] Nausea Only, Swelling, Other (See Comments)   Throat swelling Burning eyelids   Lescol [fluvastatin] Other (See Comments)   Myalgias    Lipitor [atorvastatin] Other (See Comments)   Constipation  Hot flashes Left-leaning gait   Lyrica  [pregabalin ] Other (See Comments)   Made patient feel funny   Neurontin [gabapentin] Other (See Comments)   Hallucinations    Pravastatin Other (See Comments)   Myalgias    Tape Other (See Comments)   Skin sensitive - tears and bruises easily   Zetia [ezetimibe] Other (See Comments)   Myalgias    Zocor [simvastatin] Other (See Comments)   Myalgias         Medication List     PAUSE taking these medications    losartan  25 MG tablet Wait to take this until your doctor or other care provider tells you to start again. Commonly known as: COZAAR  Take 25 mg by mouth every Monday, Wednesday, and Friday.       TAKE these medications    diclofenac Sodium 1 % Gel Commonly known as: VOLTAREN Apply 2 g topically 4 (four) times daily.  divalproex 125 MG capsule Commonly known as: DEPAKOTE SPRINKLE Take 125 mg by mouth 2 (two) times daily as needed (agitation).   donepezil  10 MG tablet Commonly known as: ARICEPT  Take 10 mg by mouth every evening.   hydrOXYzine  25 MG tablet Commonly known as: ATARAX  Take 1 tablet (25 mg total) by mouth at bedtime as needed. What changed:  when to take this reasons to take this   lidocaine  5 % Commonly known as: LIDODERM  Place 2 patches onto the skin daily.  Remove & Discard patch within 12 hours or as directed by MD   LORazepam  1 MG tablet Commonly known as: ATIVAN  Take 1 tablet (1 mg total) by mouth in the morning and at bedtime for 3 days.   memantine  5 MG tablet Commonly known as: NAMENDA  Take 5 mg by mouth daily.   metoprolol  tartrate 25 MG tablet Commonly known as: LOPRESSOR  Take 0.5 tablets (12.5 mg total) by mouth 2 (two) times daily.   mirabegron  ER 50 MG Tb24 tablet Commonly known as: MYRBETRIQ  Take 50 mg by mouth daily.   MIRALAX  PO Take 17 g by mouth daily.   multivitamin with minerals Tabs tablet Take 1 tablet by mouth daily. Start taking on: November 16, 2023   pantoprazole  40 MG tablet Commonly known as: PROTONIX  Take 40 mg by mouth daily.   pentosan polysulfate 100 MG capsule Commonly known as: ELMIRON  Take 100 mg by mouth daily.   QUEtiapine 25 MG tablet Commonly known as: SEROQUEL Take 25 mg by mouth at bedtime.   Repatha SureClick 140 MG/ML Soaj Generic drug: Evolocumab Inject 140 mg into the skin every 14 (fourteen) days.   Rivaroxaban  15 MG Tabs tablet Commonly known as: XARELTO  Take 1 tablet (15 mg total) by mouth daily with supper. What changed: when to take this   traMADol  50 MG tablet Commonly known as: ULTRAM  Take 1 tablet (50 mg total) by mouth 3 (three) times daily as needed for up to 3 days (pain).   Vitamin D3 25 MCG (1000 UT) Caps Take 1,000 Units by mouth daily.        Contact information for after-discharge care     Destination     [facility] .   Service: Skilled Nursing Contact information: [address] [place]  [address] [phone]                    Discharge Exam: Christine [name]   11/07/23 1033  Weight: 56.4 kg   General: NAD  Cardiovascular: S1, S2 present Respiratory: CTAB Abdomen: Soft, nontender, nondistended, bowel sounds present Musculoskeletal: No bilateral pedal edema noted Skin: Normal Psychiatry: Normal mood    Condition at discharge: good  The results of significant diagnostics from this hospitalization (including imaging, microbiology, ancillary and laboratory) are listed below for reference.   Imaging Studies: ECHOCARDIOGRAM COMPLETE Result Date: 11/08/2023    ECHOCARDIOGRAM REPORT   Patient Name:   Christine [name] Date of Exam: 11/08/2023 Medical Rec #:  [id number]       Height:       64.0 in Accession #:    [id number]      Weight:       124.3 lb Date of Birth:  1933-10-03       BSA:          1.599 m Patient Age:    89 years        BP:           148/88 mmHg Patient  Gender: F               HR:           116 bpm. Exam Location:  Inpatient Procedure: 2D Echo, Cardiac Doppler and Color Doppler (Both Spectral and Color            Flow Doppler were utilized during procedure). Indications:    Atrial Fibrillation I48.91, Stroke I63.9  History:        Patient has prior history of Echocardiogram examinations, most                 recent 01/30/2022. Previous Myocardial Infarction, TIA,                 Arrythmias:Atrial Fibrillation, Signs/Symptoms:Chest Pain and                 Syncope; Risk Factors:Hypertension and Dyslipidemia.  Sonographer:    Thea Norlander RCS Referring Phys: MARIO GAILS PATEL IMPRESSIONS  1. Left ventricular ejection fraction, by estimation, is 55 to 60%. The left ventricle has normal function. The left ventricle has no regional wall motion abnormalities. Indeterminate diastolic filling due to E-A fusion.  2. Right ventricular systolic function is normal. The right ventricular size is normal.  3. The mitral valve is normal in structure. Mild to moderate mitral valve regurgitation. No evidence of mitral stenosis.  4. The aortic valve is normal in structure. There is moderate calcification of the aortic valve. There is mild thickening of the aortic valve. Aortic valve regurgitation is trivial. No aortic stenosis is present.  5. The inferior vena cava is dilated in size with <50% respiratory variability,  suggesting right atrial pressure of 15 mmHg. FINDINGS  Left Ventricle: Left ventricular ejection fraction, by estimation, is 55 to 60%. The left ventricle has normal function. The left ventricle has no regional wall motion abnormalities. The left ventricular internal cavity size was normal in size. There is  no left ventricular hypertrophy. Indeterminate diastolic filling due to E-A fusion. Right Ventricle: The right ventricular size is normal. No increase in right ventricular wall thickness. Right ventricular systolic function is normal. Left Atrium: Left atrial size was normal in size. Right Atrium: Right atrial size was normal in size. Pericardium: There is no evidence of pericardial effusion. Mitral Valve: The mitral valve is normal in structure. Mild to moderate mitral valve regurgitation. No evidence of mitral valve stenosis. Tricuspid Valve: The tricuspid valve is normal in structure. Tricuspid valve regurgitation is mild . No evidence of tricuspid stenosis. Aortic Valve: The aortic valve is normal in structure. There is moderate calcification of the aortic valve. There is mild thickening of the aortic valve. Aortic valve regurgitation is trivial. No aortic stenosis is present. Aortic valve peak gradient measures 9.5 mmHg. Pulmonic Valve: The pulmonic valve was normal in structure. Pulmonic valve regurgitation is trivial. No evidence of pulmonic stenosis. Aorta: The aortic root is normal in size and structure. Venous: The inferior vena cava is dilated in size with less than 50% respiratory variability, suggesting right atrial pressure of 15 mmHg. IAS/Shunts: No atrial level shunt detected by color flow Doppler.  LEFT VENTRICLE PLAX 2D LVIDd:         3.70 cm   Diastology LVIDs:         2.40 cm   LV e' medial:    9.46 cm/s LV PW:         1.10 cm   LV E/e' medial:  11.1 LV IVS:  0.90 cm   LV e' lateral:   18.40 cm/s LVOT diam:     1.90 cm   LV E/e' lateral: 5.7 LV SV:         50 LV SV Index:   32 LVOT  Area:     2.84 cm  RIGHT VENTRICLE             IVC RV S prime:     30.00 cm/s  IVC diam: 2.20 cm TAPSE (M-mode): 1.5 cm LEFT ATRIUM             Index        RIGHT ATRIUM           Index LA diam:        2.90 cm 1.81 cm/m   RA Area:     12.00 cm LA Vol (A2C):   43.7 ml 27.34 ml/m  RA Volume:   23.40 ml  14.64 ml/m LA Vol (A4C):   27.9 ml 17.45 ml/m LA Biplane Vol: 34.6 ml 21.64 ml/m  AORTIC VALVE AV Area (Vmax): 2.29 cm AV Vmax:        154.00 cm/s AV Peak Grad:   9.5 mmHg LVOT Vmax:      124.33 cm/s LVOT Vmean:     80.767 cm/s LVOT VTI:       0.178 m  AORTA Ao Root diam: 3.30 cm Ao Asc diam:  3.30 cm MITRAL VALVE                TRICUSPID VALVE MV Area (PHT): 4.96 cm     TR Peak grad:   226576.0 mmHg MV Decel Time: 153 msec     TR Vmax:        23800.00 cm/s MV E velocity: 105.00 cm/s MV A velocity: 44.60 cm/s   SHUNTS MV E/A ratio:  2.35         Systemic VTI:  0.18 m                             Systemic Diam: 1.90 cm Joelle Azobou Tonleu Electronically signed by Joelle Cedars Tonleu Signature Date/Time: 11/08/2023/12:37:13 PM    Final    CT Hip Left Wo Contrast Result Date: 11/07/2023 CLINICAL DATA:  Hip trauma, fell EXAM: CT OF THE LEFT HIP WITHOUT CONTRAST TECHNIQUE: Multidetector CT imaging of the left hip was performed according to the standard protocol. Multiplanar CT image reconstructions were also generated. RADIATION DOSE REDUCTION: This exam was performed according to the departmental dose-optimization program which includes automated exposure control, adjustment of the mA and/or kV according to patient size and/or use of iterative reconstruction technique. COMPARISON:  11/07/2023, 11/06/2023 FINDINGS: Bones/Joint/Cartilage Stable appearance of the left hip arthroplasty. No evidence of metallic hardware failure or loosening. There are no acute displaced fractures. Stable spondylosis and facet hypertrophy at the lumbosacral junction. Ligaments Suboptimally assessed by CT. Muscles and Tendons No acute  findings. Soft tissues No evidence of soft tissue swelling. Atherosclerosis of the left iliac and femoral vessels. Reconstructed images demonstrate no additional findings. IMPRESSION: 1. Stable left hip arthroplasty. 2. No acute displaced fracture. Electronically Signed   By: Ozell Daring M.D.   On: 11/07/2023 19:43   MR BRAIN WO CONTRAST Result Date: 11/07/2023 EXAM: MRI BRAIN WITHOUT CONTRAST 11/07/2023 05:01:02 PM TECHNIQUE: Multiplanar multisequence MRI of the head/brain was performed without the administration of intravenous contrast. COMPARISON: Head CT 11/07/2023 and MRI 12/21/2015. CLINICAL HISTORY: aphasia FINDINGS: LIMITATIONS: The  examination is mildly motion degraded. Axial T1 and coronal T2 sequences were not submitted. BRAIN AND VENTRICLES: There is no evidence of an acute infarct, intracranial hemorrhage, mass, midline shift, hydrocephalus, or extra-axial fluid collection. There is advanced cerebral atrophy. Patchy to confluent T2 hyperintensities in the cerebral white matter bilaterally have mildly progressed from the prior MRI and are nonspecific but compatible with moderate to severe chronic small vessel ischemic disease. Mild to moderate chronic small vessel changes are present in the pons. Major intracranial vascular flow voids are preserved. ORBITS: Bilateral cataract extraction. SINUSES AND MASTOIDS: Extensive bilateral ethmoid air cell opacification. Mild mucosal thickening in the other paranasal sinuses. Small left mastoid effusion. BONES AND SOFT TISSUES: Normal marrow signal. No acute soft tissue abnormality. IMPRESSION: 1. No acute intracranial abnormality. 2. Advanced cerebral atrophy and chronic small vessel ischemic disease. Electronically signed by: Dasie Hamburg MD 11/07/2023 05:33 PM EST RP Workstation: [id number]   DG Ankle Complete Right Result Date: 11/07/2023 CLINICAL DATA:  Un witnessed fall EXAM: RIGHT ANKLE - COMPLETE 3+ VIEW; DG FOOT COMPLETE 3+V*R* COMPARISON:  None  Available. FINDINGS: Right ankle: Frontal, oblique, and lateral views are obtained. Evaluation slightly limited by patient positioning. No acute displaced fracture, subluxation, or dislocation. Mild hindfoot osteoarthritis. Soft tissues are unremarkable. Right foot: Frontal, oblique, and lateral views are obtained. No acute displaced fracture. Moderate hallux valgus deformity with osteoarthritis at the first metatarsophalangeal joint. Mild midfoot osteoarthritis. Soft tissues are unremarkable. IMPRESSION: 1. No acute fracture of the right foot or ankle. 2. Multifocal osteoarthritis. 3. Hallux valgus deformity. Electronically Signed   By: Ozell Daring M.D.   On: 11/07/2023 16:30   DG Foot Complete Right Result Date: 11/07/2023 CLINICAL DATA:  Un witnessed fall EXAM: RIGHT ANKLE - COMPLETE 3+ VIEW; DG FOOT COMPLETE 3+V*R* COMPARISON:  None Available. FINDINGS: Right ankle: Frontal, oblique, and lateral views are obtained. Evaluation slightly limited by patient positioning. No acute displaced fracture, subluxation, or dislocation. Mild hindfoot osteoarthritis. Soft tissues are unremarkable. Right foot: Frontal, oblique, and lateral views are obtained. No acute displaced fracture. Moderate hallux valgus deformity with osteoarthritis at the first metatarsophalangeal joint. Mild midfoot osteoarthritis. Soft tissues are unremarkable. IMPRESSION: 1. No acute fracture of the right foot or ankle. 2. Multifocal osteoarthritis. 3. Hallux valgus deformity. Electronically Signed   By: Ozell Daring M.D.   On: 11/07/2023 16:30   CT Cervical Spine Wo Contrast Result Date: 11/07/2023 CLINICAL DATA:  Clemens, history of it dimension EXAM: CT CERVICAL SPINE WITHOUT CONTRAST TECHNIQUE: Multidetector CT imaging of the cervical spine was performed without intravenous contrast. Multiplanar CT image reconstructions were also generated. RADIATION DOSE REDUCTION: This exam was performed according to the departmental dose-optimization  program which includes automated exposure control, adjustment of the mA and/or kV according to patient size and/or use of iterative reconstruction technique. COMPARISON:  07/14/2011 FINDINGS: Alignment: Reversal cervical lordosis with kyphosis centered at the C5-6 level, likely due to multilevel degenerative changes. Skull base and vertebrae: No acute fracture. No primary bone lesion or focal pathologic process. Soft tissues and spinal canal: No prevertebral fluid or swelling. No visible canal hematoma. Disc levels: Marked hypertrophic changes at the C1-C2 interface. Partial bony fusion across the disc space and bilateral facet joints at C2-3. Bony fusion across the bilateral facets at C4-5. There is multilevel spondylosis most pronounced at the C4-5, C5-6, and C6-7 levels. Diffuse multilevel facet hypertrophy greatest at C3-4. Upper chest: There is a spiculated subpleural 1.2 x 0.9 cm left upper lobe pulmonary nodule, concerning  for underlying neoplasm. Airway is patent. Other: Reconstructed images demonstrate no additional findings. IMPRESSION: 1. No acute cervical spine fracture. 2. Suspicious left solid pulmonary nodule within the upper lobe measuring 12 mm detected on incomplete chest CT. Per Fleischner Society Guidelines, recommend prompt non-contrast Chest CT for further evaluation if the patient would be a therapy candidate should neoplasm be detected. Reference: Radiology. 2017; 284(1):228-43. 3. Extensive multilevel cervical degenerative changes as above. Electronically Signed   By: Ozell Daring M.D.   On: 11/07/2023 12:50   CT Head Wo Contrast Result Date: 11/07/2023 CLINICAL DATA:  Clemens, history of dementia EXAM: CT HEAD WITHOUT CONTRAST TECHNIQUE: Contiguous axial images were obtained from the base of the skull through the vertex without intravenous contrast. RADIATION DOSE REDUCTION: This exam was performed according to the departmental dose-optimization program which includes automated exposure  control, adjustment of the mA and/or kV according to patient size and/or use of iterative reconstruction technique. COMPARISON:  09/15/2022 FINDINGS: Brain: Stable chronic small-vessel ischemic changes throughout the periventricular white matter. No acute infarct or hemorrhage. Lateral ventricles and remaining midline structures are stable. No acute extra-axial fluid collections. No mass effect. Stable diffuse cerebral cortical atrophy. Vascular: No hyperdense vessel or unexpected calcification. Skull: Normal. Negative for fracture or focal lesion. Sinuses/Orbits: Mucosal thickening throughout the ethmoid air cells. No gas fluid levels. Trace left mastoid effusion. Other: None. IMPRESSION: 1. Stable head CT, no acute intracranial process. Electronically Signed   By: Ozell Daring M.D.   On: 11/07/2023 12:46   DG Knee 2 Views Left Result Date: 11/07/2023 EXAM: 1 or 2 VIEW(S) XRAY OF THE LEFT KNEE 11/07/2023 11:12:00 AM COMPARISON: None available. CLINICAL HISTORY: fall FINDINGS: BONES AND JOINTS: No acute fracture. No focal osseous lesion. No joint dislocation. Possible small suprapatellar joint effusion is noted. Severe narrowing of lateral joint space is noted. SOFT TISSUES: The soft tissues are unremarkable. IMPRESSION: 1. Severe lateral compartment joint space narrowing, consistent with advanced osteoarthritis. 2. Possible small suprapatellar joint effusion. Electronically signed by: Lynwood Seip MD 11/07/2023 11:20 AM EST RP Workstation: [id number]   DG Hip Unilat W or Wo Pelvis 2-3 Views Left Result Date: 11/07/2023 EXAM: 2 or 3 VIEW(S) XRAY OF THE LEFT HIP 11/07/2023 11:12:00 AM COMPARISON: 06/11/2016 status post left total hip arthroplasty. CLINICAL HISTORY: fall fall FINDINGS: BONES AND JOINTS: Status post left total hip arthroplasty. No acute fracture or focal osseous lesion. SOFT TISSUES: The soft tissues are unremarkable. IMPRESSION: 1. No acute fracture or dislocation. 2. Status post left total hip  arthroplasty. Electronically signed by: Lynwood Seip MD 11/07/2023 11:19 AM EST RP Workstation: [id number]   DG Ankle Complete Left Result Date: 11/07/2023 EXAM: 3 OR MORE VIEWS XRAY OF THE LEFT ANKLE 11/07/2023 11:12:00 AM CLINICAL HISTORY: Fall. COMPARISON: None available. FINDINGS: BONES AND JOINTS: No acute fracture. No focal osseous lesion. No joint dislocation. SOFT TISSUES: The soft tissues are unremarkable. IMPRESSION: 1. No acute osseous abnormality. Electronically signed by: Lynwood Seip MD 11/07/2023 11:17 AM EST RP Workstation: [id number]   DG Chest Port 1 View Result Date: 11/07/2023 EXAM: 1 VIEW(S) XRAY OF THE CHEST 11/07/2023 11:12:00 AM COMPARISON: 01/29/2022 CLINICAL HISTORY: Questionable sepsis - evaluate for abnormality FINDINGS: LUNGS AND PLEURA: No focal pulmonary opacity. No pulmonary edema. No pleural effusion. No pneumothorax. HEART AND MEDIASTINUM: No acute abnormality of the cardiac and mediastinal silhouettes. BONES AND SOFT TISSUES: No acute osseous abnormality. IMPRESSION: 1. No acute cardiopulmonary disease. Electronically signed by: Lynwood Seip MD 11/07/2023 11:15 AM EST RP Workstation: [id number]  CT Renal Stone Study Result Date: 11/06/2023 CLINICAL DATA:  Abdominal and flank pain. EXAM: CT ABDOMEN AND PELVIS WITHOUT CONTRAST TECHNIQUE: Multidetector CT imaging of the abdomen and pelvis was performed following the standard protocol without IV contrast. RADIATION DOSE REDUCTION: This exam was performed according to the departmental dose-optimization program which includes automated exposure control, adjustment of the mA and/or kV according to patient size and/or use of iterative reconstruction technique. COMPARISON:  02/18/2010 FINDINGS: Lower chest: No acute findings. Hepatobiliary: Several small hepatic cysts are again noted. No hepatic masses seen on this unenhanced exam. Gallbladder is unremarkable. No evidence of biliary ductal dilatation. Pancreas: No mass or  inflammatory process visualized on this unenhanced exam. Spleen:  Within normal limits in size. Adrenals/Urinary tract: A few punctate left renal calculi are noted. No evidence of ureteral calculi or hydronephrosis. Poor visualization of distal ureters and bladder due to severe artifact from left hip prostheses. Stomach/Bowel: No evidence of obstruction, inflammatory process, or abnormal fluid collections. Normal appendix visualized. Diverticulosis is seen mainly involving the sigmoid colon, however there is no evidence of diverticulitis. Vascular/Lymphatic: No pathologically enlarged lymph nodes identified. No evidence of abdominal aortic aneurysm. Reproductive: Prior hysterectomy noted. Adnexal regions are unremarkable in appearance. Other:  None. Musculoskeletal:  No suspicious bone lesions identified. IMPRESSION: Tiny left renal calculi. No evidence of ureteral calculi, hydronephrosis, or other acute findings. Colonic diverticulosis, without radiographic evidence of diverticulitis. Electronically Signed   By: Norleen DELENA Kil M.D.   On: 11/06/2023 10:27    Microbiology: Results for orders placed or performed during the hospital encounter of 11/07/23  Resp panel by RT-PCR (RSV, Flu A&B, Covid) Anterior Nasal Swab     Status: None   Collection Time: 11/07/23 10:50 AM   Specimen: Anterior Nasal Swab  Result Value Ref Range Status   SARS Coronavirus 2 by RT PCR NEGATIVE NEGATIVE Final   Influenza A by PCR NEGATIVE NEGATIVE Final   Influenza B by PCR NEGATIVE NEGATIVE Final    Comment: (NOTE) The Xpert Xpress SARS-CoV-2/FLU/RSV plus assay is intended as an aid in the diagnosis of influenza from Nasopharyngeal swab specimens and should not be used as a sole basis for treatment. Nasal washings and aspirates are unacceptable for Xpert Xpress SARS-CoV-2/FLU/RSV testing.  Fact Sheet for Patients: bloggercourse.com  Fact Sheet for Healthcare  Providers: seriousbroker.it  This test is not yet approved or cleared by the [place]  FDA and has been authorized for detection and/or diagnosis of SARS-CoV-2 by FDA under an Emergency Use Authorization (EUA). This EUA will remain in effect (meaning this test can be used) for the duration of the COVID-19 declaration under Section 564(b)(1) of the Act, 21 U.S.C. section 360bbb-3(b)(1), unless the authorization is terminated or revoked.     Resp Syncytial Virus by PCR NEGATIVE NEGATIVE Final    Comment: (NOTE) Fact Sheet for Patients: bloggercourse.com  Fact Sheet for Healthcare Providers: seriousbroker.it  This test is not yet approved or cleared by the [place]  FDA and has been authorized for detection and/or diagnosis of SARS-CoV-2 by FDA under an Emergency Use Authorization (EUA). This EUA will remain in effect (meaning this test can be used) for the duration of the COVID-19 declaration under Section 564(b)(1) of the Act, 21 U.S.C. section 360bbb-3(b)(1), unless the authorization is terminated or revoked.  Performed at [facility], [address] [place], [place] [address]   Blood Culture (routine x 2)     Status: None   Collection Time: 11/07/23 10:50 AM  Specimen: BLOOD  Result Value Ref Range Status   Specimen Description BLOOD LEFT ANTECUBITAL  Final   Special Requests   Final    BOTTLES DRAWN AEROBIC AND ANAEROBIC Blood Culture adequate volume   Culture   Final    NO GROWTH 5 DAYS Performed at [facility], [address] [place], [place] [address]    Report Status 11/12/2023 FINAL  Final  Blood Culture (routine x 2)     Status: None   Collection Time: 11/07/23 10:55 AM   Specimen: BLOOD RIGHT HAND  Result Value Ref Range Status   Specimen Description BLOOD RIGHT HAND  Final   Special Requests   Final    BOTTLES DRAWN AEROBIC ONLY Blood Culture results may not be  optimal due to an inadequate volume of blood received in culture bottles   Culture   Final    NO GROWTH 5 DAYS Performed at [facility], [address] [place], [place] [address]    Report Status 11/12/2023 FINAL  Final  Urine Culture (for pregnant, neutropenic or urologic patients or patients with an indwelling urinary catheter)     Status: Abnormal   Collection Time: 11/08/23  1:51 PM   Specimen: Urine, Clean Catch  Result Value Ref Range Status   Specimen Description URINE, CLEAN CATCH  Final   Special Requests   Final    NONE Performed at [facility], [address] [place], [place] [address]    Culture MULTIPLE SPECIES PRESENT, SUGGEST RECOLLECTION (A)  Final   Report Status 11/09/2023 FINAL  Final    Labs: CBC: Recent Labs  Lab 11/09/23 0531 11/10/23 0448  WBC 8.5 6.7  HGB 10.7* 10.5*  HCT 32.6* 32.1*  MCV 88.8 89.2  PLT 142* 146*   Basic Metabolic Panel: Recent Labs  Lab 11/09/23 0531 11/10/23 0448  NA 140 139  K 4.2 3.5  CL 102 102  CO2 23 23  GLUCOSE 123* 109*  BUN 23 27*  CREATININE 1.17* 1.05*  CALCIUM  8.8* 8.9   Liver Function Tests: No results for input(s): AST, ALT, ALKPHOS, BILITOT, PROT, ALBUMIN in the last 168 hours. CBG: No results for input(s): GLUCAP in the last 168 hours.  Discharge time spent: greater than 30 minutes.  Signed: Lebron JINNY Cage, MD [facility] 11/15/2023

## 2023-11-15 NOTE — Progress Notes (Signed)
 Physical Therapy Treatment  Patient Details Name: Christine [name] MRN: [id number] DOB: Nov 16, 1933 Today's Date: 11/15/2023   History of Present Illness Pt is an 89 y/o female who presents 11/07/2023 s/p fall at [facility] (memory care). Imaging negative for acute injury, and MRI of brain negative for acute intracranial changes. PMH signficant for TIA, HTN, dementia, osteopenia, L THA.    PT Comments  Pt progressing towards physical therapy goals. Noted improved quality of gait this session with pt advancing RLE more than in previous sessions.  Pt did complain of more pain in the LLE but overall was able to tolerate an increased ambulation distance. Pt and daughter anticipate d/c to SNF this afternoon. Will continue to follow and progress as able per POC.    If plan is discharge home, recommend the following: Two people to help with walking and/or transfers;Two people to help with bathing/dressing/bathroom;Assistance with cooking/housework;Direct supervision/assist for medications management;Direct supervision/assist for financial management;Assist for transportation;Supervision due to cognitive status;Help with stairs or ramp for entrance   Can travel by private vehicle     Yes  Equipment Recommendations  Wheelchair (measurements PT);Wheelchair cushion (measurements PT);Rolling walker (2 wheels)    Recommendations for Other Services       Precautions / Restrictions Precautions Precautions: Fall Recall of Precautions/Restrictions: Impaired Restrictions Weight Bearing Restrictions Per Provider Order: No     Mobility  Bed Mobility               General bed mobility comments: Pt was received sitting up in the recliner.    Transfers Overall transfer level: Needs assistance Equipment used: Rolling walker (2 wheels) Transfers: Sit to/from Stand Sit to Stand: Min assist, Mod assist, +2 physical assistance           General transfer comment: Initially +2 min assist  progressing to +2 mod assist as pt fatigued.    Ambulation/Gait Ambulation/Gait assistance: Min assist, Mod assist, +2 safety/equipment, +2 physical assistance Gait Distance (Feet): 40 Feet (x2) Assistive device: Rolling walker (2 wheels) Gait Pattern/deviations: Step-through pattern, Decreased stride length, Trunk flexed, Narrow base of support Gait velocity: Decreased Gait velocity interpretation: <1.31 ft/sec, indicative of household ambulator   General Gait Details: Posterior bias and intermittent mod assist at times for balance and walker management. +2 for balance support and safety, and daughter pushing chair behind us  for added safety.   Stairs             Wheelchair Mobility     Tilt Bed    Modified Rankin (Stroke Patients Only)       Balance Overall balance assessment: Needs assistance Sitting-balance support: Bilateral upper extremity supported, Feet supported Sitting balance-Leahy Scale: Poor Sitting balance - Comments: Pt requires assistance to maintain sitting balance without posterior support. Postural control: Posterior lean Standing balance support: Bilateral upper extremity supported, During functional activity, Reliant on assistive device for balance Standing balance-Leahy Scale: Poor Standing balance comment: dependent on RW and external support                            Communication Communication Communication: Impaired Factors Affecting Communication: Reduced clarity of speech;Difficulty expressing self  Cognition Arousal: Alert Behavior During Therapy: WFL for tasks assessed/performed   PT - Cognitive impairments: History of cognitive impairments                         Following commands: Impaired Following commands impaired: Follows  one step commands inconsistently    Cueing Cueing Techniques: Verbal cues, Gestural cues, Tactile cues, Visual cues  Exercises General Exercises - Lower Extremity Long Arc Quad: 5  reps, Both    General Comments        Pertinent Vitals/Pain Pain Assessment Pain Assessment: Faces Faces Pain Scale: Hurts even more Pain Location: LLE Pain Descriptors / Indicators: Discomfort, Grimacing, Guarding Pain Intervention(s): Limited activity within patient's tolerance, Monitored during session, Repositioned    Home Living                          Prior Function            PT Goals (current goals can now be found in the care plan section) Acute Rehab PT Goals Patient Stated Goal: Decrease pain PT Goal Formulation: With patient/family Time For Goal Achievement: 11/22/23 Potential to Achieve Goals: Fair Progress towards PT goals: Progressing toward goals    Frequency    Min 2X/week      PT Plan      Co-evaluation              AM-PAC PT 6 Clicks Mobility   Outcome Measure  Help needed turning from your back to your side while in a flat bed without using bedrails?: A Lot Help needed moving from lying on your back to sitting on the side of a flat bed without using bedrails?: A Lot Help needed moving to and from a bed to a chair (including a wheelchair)?: A Lot Help needed standing up from a chair using your arms (e.g., wheelchair or bedside chair)?: A Lot Help needed to walk in hospital room?: A Lot Help needed climbing 3-5 steps with a railing? : Total 6 Click Score: 11    End of Session Equipment Utilized During Treatment: Gait belt Activity Tolerance: Patient tolerated treatment well Patient left: in chair;with call bell/phone within reach;with chair alarm set;with family/visitor present Nurse Communication: Mobility status PT Visit Diagnosis: Pain;Difficulty in walking, not elsewhere classified (R26.2);History of falling (Z91.81);Muscle weakness (generalized) (M62.81) Pain - Right/Left: Left Pain - part of body: Hip     Time: 8995-8975 PT Time Calculation (min) (ACUTE ONLY): 20 min  Charges:    $Gait Training: 8-22  mins PT General Charges $$ ACUTE PT VISIT: 1 Visit                     Christine [name], PT, DPT Acute Rehabilitation Services Secure Chat Preferred Office: [phone]    Christine [name] 11/15/2023, 1:05 PM

## 2023-11-15 NOTE — Progress Notes (Signed)
 Discharge paperwork provided to patient's daughter. Informed daughter to provide paperwork to [facility] upon arrival. Daughter verbalized understanding.

## 2023-11-15 NOTE — TOC Transition Note (Signed)
 Transition of Care [facility]) - Discharge Note   Patient Details  Name: Christine [name] MRN: [id number] Date of Birth: 18-Jul-1933  [facility] CM/SW Contact:  Christine [name] Phone Number: 11/15/2023, 12:28 PM   Clinical Narrative:    Patient will DC to: [facility] Anticipated DC date: 11/15/23 Family notified: Yes Transport by: Daughter    Per MD patient ready for DC to [facility]. RN to call report prior to discharge [phone] Room 609A. RN, patient, patient's family, and facility notified of DC. Discharge Summary and FL2 sent to facility. Daughter will transport pt to facility.   CSW will sign off for now as social work intervention is no longer needed. Please consult us  again if new needs arise.     Final next level of care: Skilled Nursing Facility Barriers to Discharge: Barriers Resolved   Patient Goals and CMS Choice Patient states their goals for this hospitalization and ongoing recovery are:: SNF          Discharge Placement   Existing PASRR number confirmed : 11/15/23          Patient chooses bed at: [facility] Patient to be transferred to facility by: Daughter Name of family member notified: [name] Patient and family notified of of transfer: 11/15/23  Discharge Plan and Services Additional resources added to the After Visit Summary for                                       Social Drivers of Health (SDOH) Interventions SDOH Screenings   Food Insecurity: No Food Insecurity (11/07/2023)  Housing: Low Risk  (11/07/2023)  Transportation Needs: No Transportation Needs (11/07/2023)  Utilities: Not At Risk (11/07/2023)  Tobacco Use: Low Risk  (11/10/2023)     Readmission Risk Interventions     No data to display

## 2023-11-15 NOTE — Progress Notes (Signed)
 Report called to [facility], spoke with nurse Darice. All questions and concerns about patient were addressed and answered. Informed Darice that daughter will be transporting patient to facility.

## 2023-11-18 DIAGNOSIS — F02811 Dementia in other diseases classified elsewhere, unspecified severity, with agitation: Secondary | ICD-10-CM | POA: Diagnosis not present

## 2023-11-18 DIAGNOSIS — F419 Anxiety disorder, unspecified: Secondary | ICD-10-CM | POA: Diagnosis not present

## 2023-11-18 DIAGNOSIS — E43 Unspecified severe protein-calorie malnutrition: Secondary | ICD-10-CM | POA: Diagnosis not present

## 2023-11-18 DIAGNOSIS — E785 Hyperlipidemia, unspecified: Secondary | ICD-10-CM | POA: Diagnosis not present

## 2023-11-19 DIAGNOSIS — R600 Localized edema: Secondary | ICD-10-CM | POA: Diagnosis not present

## 2023-11-19 DIAGNOSIS — M109 Gout, unspecified: Secondary | ICD-10-CM | POA: Diagnosis not present

## 2023-11-19 DIAGNOSIS — I82401 Acute embolism and thrombosis of unspecified deep veins of right lower extremity: Secondary | ICD-10-CM | POA: Diagnosis not present

## 2023-11-22 ENCOUNTER — Encounter (HOSPITAL_COMMUNITY): Payer: Self-pay | Admitting: Internal Medicine

## 2023-11-24 ENCOUNTER — Inpatient Hospital Stay (HOSPITAL_COMMUNITY)
Admission: EM | Admit: 2023-11-24 | Discharge: 2023-11-29 | DRG: 689 | Disposition: A | Discharge status: Skilled Nursing Facility | Source: Skilled Nursing Facility | Attending: Internal Medicine | Admitting: Internal Medicine

## 2023-11-24 ENCOUNTER — Other Ambulatory Visit: Payer: Self-pay

## 2023-11-24 ENCOUNTER — Emergency Department (HOSPITAL_COMMUNITY)

## 2023-11-24 ENCOUNTER — Emergency Department (EMERGENCY_DEPARTMENT_HOSPITAL)
Admit: 2023-11-24 | Discharge: 2023-11-24 | Disposition: A | Discharge status: Home / Self Care | Attending: Emergency Medicine | Admitting: Emergency Medicine

## 2023-11-24 ENCOUNTER — Encounter (HOSPITAL_COMMUNITY): Payer: Self-pay

## 2023-11-24 DIAGNOSIS — M79605 Pain in left leg: Secondary | ICD-10-CM | POA: Diagnosis present

## 2023-11-24 DIAGNOSIS — Z860100 Personal history of colon polyps, unspecified: Secondary | ICD-10-CM

## 2023-11-24 DIAGNOSIS — M79604 Pain in right leg: Secondary | ICD-10-CM | POA: Diagnosis not present

## 2023-11-24 DIAGNOSIS — J189 Pneumonia, unspecified organism: Secondary | ICD-10-CM | POA: Diagnosis not present

## 2023-11-24 DIAGNOSIS — R410 Disorientation, unspecified: Secondary | ICD-10-CM | POA: Diagnosis not present

## 2023-11-24 DIAGNOSIS — R262 Difficulty in walking, not elsewhere classified: Secondary | ICD-10-CM | POA: Diagnosis not present

## 2023-11-24 DIAGNOSIS — Z87440 Personal history of urinary (tract) infections: Secondary | ICD-10-CM

## 2023-11-24 DIAGNOSIS — Z91048 Other nonmedicinal substance allergy status: Secondary | ICD-10-CM

## 2023-11-24 DIAGNOSIS — R52 Pain, unspecified: Secondary | ICD-10-CM | POA: Diagnosis not present

## 2023-11-24 DIAGNOSIS — Z888 Allergy status to other drugs, medicaments and biological substances status: Secondary | ICD-10-CM

## 2023-11-24 DIAGNOSIS — R338 Other retention of urine: Secondary | ICD-10-CM | POA: Diagnosis not present

## 2023-11-24 DIAGNOSIS — Z96642 Presence of left artificial hip joint: Secondary | ICD-10-CM | POA: Diagnosis present

## 2023-11-24 DIAGNOSIS — Z79899 Other long term (current) drug therapy: Secondary | ICD-10-CM

## 2023-11-24 DIAGNOSIS — Z90710 Acquired absence of both cervix and uterus: Secondary | ICD-10-CM

## 2023-11-24 DIAGNOSIS — R339 Retention of urine, unspecified: Secondary | ICD-10-CM | POA: Diagnosis not present

## 2023-11-24 DIAGNOSIS — E785 Hyperlipidemia, unspecified: Secondary | ICD-10-CM | POA: Diagnosis not present

## 2023-11-24 DIAGNOSIS — D649 Anemia, unspecified: Secondary | ICD-10-CM | POA: Diagnosis present

## 2023-11-24 DIAGNOSIS — N136 Pyonephrosis: Principal | ICD-10-CM | POA: Diagnosis present

## 2023-11-24 DIAGNOSIS — G9340 Encephalopathy, unspecified: Secondary | ICD-10-CM | POA: Diagnosis present

## 2023-11-24 DIAGNOSIS — I252 Old myocardial infarction: Secondary | ICD-10-CM

## 2023-11-24 DIAGNOSIS — Z961 Presence of intraocular lens: Secondary | ICD-10-CM | POA: Diagnosis present

## 2023-11-24 DIAGNOSIS — I4891 Unspecified atrial fibrillation: Secondary | ICD-10-CM

## 2023-11-24 DIAGNOSIS — E860 Dehydration: Secondary | ICD-10-CM | POA: Diagnosis present

## 2023-11-24 DIAGNOSIS — J90 Pleural effusion, not elsewhere classified: Secondary | ICD-10-CM | POA: Diagnosis not present

## 2023-11-24 DIAGNOSIS — R296 Repeated falls: Secondary | ICD-10-CM | POA: Diagnosis present

## 2023-11-24 DIAGNOSIS — F0390 Unspecified dementia without behavioral disturbance: Secondary | ICD-10-CM | POA: Diagnosis present

## 2023-11-24 DIAGNOSIS — N1831 Chronic kidney disease, stage 3a: Secondary | ICD-10-CM | POA: Diagnosis present

## 2023-11-24 DIAGNOSIS — K219 Gastro-esophageal reflux disease without esophagitis: Secondary | ICD-10-CM | POA: Diagnosis present

## 2023-11-24 DIAGNOSIS — Z993 Dependence on wheelchair: Secondary | ICD-10-CM

## 2023-11-24 DIAGNOSIS — I2510 Atherosclerotic heart disease of native coronary artery without angina pectoris: Secondary | ICD-10-CM | POA: Diagnosis present

## 2023-11-24 DIAGNOSIS — I10 Essential (primary) hypertension: Secondary | ICD-10-CM | POA: Diagnosis not present

## 2023-11-24 DIAGNOSIS — M79606 Pain in leg, unspecified: Secondary | ICD-10-CM | POA: Diagnosis not present

## 2023-11-24 DIAGNOSIS — R918 Other nonspecific abnormal finding of lung field: Secondary | ICD-10-CM | POA: Diagnosis not present

## 2023-11-24 DIAGNOSIS — Z9841 Cataract extraction status, right eye: Secondary | ICD-10-CM

## 2023-11-24 DIAGNOSIS — I6523 Occlusion and stenosis of bilateral carotid arteries: Secondary | ICD-10-CM | POA: Diagnosis not present

## 2023-11-24 DIAGNOSIS — B952 Enterococcus as the cause of diseases classified elsewhere: Secondary | ICD-10-CM | POA: Diagnosis present

## 2023-11-24 DIAGNOSIS — Z7901 Long term (current) use of anticoagulants: Secondary | ICD-10-CM

## 2023-11-24 DIAGNOSIS — Z66 Do not resuscitate: Secondary | ICD-10-CM | POA: Diagnosis present

## 2023-11-24 DIAGNOSIS — L89156 Pressure-induced deep tissue damage of sacral region: Secondary | ICD-10-CM | POA: Diagnosis present

## 2023-11-24 DIAGNOSIS — N3000 Acute cystitis without hematuria: Secondary | ICD-10-CM

## 2023-11-24 DIAGNOSIS — Z8673 Personal history of transient ischemic attack (TIA), and cerebral infarction without residual deficits: Secondary | ICD-10-CM

## 2023-11-24 DIAGNOSIS — Z9842 Cataract extraction status, left eye: Secondary | ICD-10-CM

## 2023-11-24 DIAGNOSIS — G9341 Metabolic encephalopathy: Secondary | ICD-10-CM | POA: Diagnosis present

## 2023-11-24 DIAGNOSIS — M8580 Other specified disorders of bone density and structure, unspecified site: Secondary | ICD-10-CM | POA: Diagnosis present

## 2023-11-24 DIAGNOSIS — D631 Anemia in chronic kidney disease: Secondary | ICD-10-CM | POA: Diagnosis present

## 2023-11-24 DIAGNOSIS — I129 Hypertensive chronic kidney disease with stage 1 through stage 4 chronic kidney disease, or unspecified chronic kidney disease: Secondary | ICD-10-CM | POA: Diagnosis present

## 2023-11-24 DIAGNOSIS — I480 Paroxysmal atrial fibrillation: Secondary | ICD-10-CM | POA: Diagnosis present

## 2023-11-24 DIAGNOSIS — S199XXA Unspecified injury of neck, initial encounter: Secondary | ICD-10-CM | POA: Diagnosis not present

## 2023-11-24 DIAGNOSIS — Z1152 Encounter for screening for COVID-19: Secondary | ICD-10-CM

## 2023-11-24 DIAGNOSIS — R400 Somnolence: Secondary | ICD-10-CM | POA: Diagnosis not present

## 2023-11-24 DIAGNOSIS — Z8619 Personal history of other infectious and parasitic diseases: Secondary | ICD-10-CM

## 2023-11-24 DIAGNOSIS — R109 Unspecified abdominal pain: Secondary | ICD-10-CM | POA: Diagnosis not present

## 2023-11-24 DIAGNOSIS — N320 Bladder-neck obstruction: Secondary | ICD-10-CM | POA: Diagnosis present

## 2023-11-24 DIAGNOSIS — R9082 White matter disease, unspecified: Secondary | ICD-10-CM | POA: Diagnosis not present

## 2023-11-24 LAB — RETICULOCYTES
Immature Retic Fract: 28 % — ABNORMAL HIGH (ref 2.3–15.9)
RBC.: 3.09 MIL/uL — ABNORMAL LOW (ref 3.87–5.11)
Retic Count, Absolute: 48.2 K/uL (ref 19.0–186.0)
Retic Ct Pct: 1.6 % (ref 0.4–3.1)

## 2023-11-24 LAB — VITAMIN B12: Vitamin B-12: 2616 pg/mL — ABNORMAL HIGH (ref 180–914)

## 2023-11-24 LAB — MAGNESIUM: Magnesium: 2.1 mg/dL (ref 1.7–2.4)

## 2023-11-24 LAB — COMPREHENSIVE METABOLIC PANEL WITH GFR
ALT: 22 U/L (ref 0–44)
AST: 26 U/L (ref 15–41)
Albumin: 3.6 g/dL (ref 3.5–5.0)
Alkaline Phosphatase: 97 U/L (ref 38–126)
Anion gap: 12 (ref 5–15)
BUN: 23 mg/dL (ref 8–23)
CO2: 24 mmol/L (ref 22–32)
Calcium: 9.4 mg/dL (ref 8.9–10.3)
Chloride: 105 mmol/L (ref 98–111)
Creatinine, Ser: 1.22 mg/dL — ABNORMAL HIGH (ref 0.44–1.00)
GFR, Estimated: 42 mL/min — ABNORMAL LOW (ref >60)
Glucose, Bld: 112 mg/dL — ABNORMAL HIGH (ref 70–99)
Potassium: 5 mmol/L (ref 3.5–5.1)
Sodium: 141 mmol/L (ref 135–145)
Total Bilirubin: 0.5 mg/dL (ref 0.0–1.2)
Total Protein: 7.5 g/dL (ref 6.5–8.1)

## 2023-11-24 LAB — CK: Total CK: 84 U/L (ref 38–234)

## 2023-11-24 LAB — BASIC METABOLIC PANEL WITH GFR
Anion gap: 10 (ref 5–15)
BUN: 21 mg/dL (ref 8–23)
CO2: 23 mmol/L (ref 22–32)
Calcium: 9 mg/dL (ref 8.9–10.3)
Chloride: 103 mmol/L (ref 98–111)
Creatinine, Ser: 1.08 mg/dL — ABNORMAL HIGH (ref 0.44–1.00)
GFR, Estimated: 49 mL/min — ABNORMAL LOW (ref >60)
Glucose, Bld: 117 mg/dL — ABNORMAL HIGH (ref 70–99)
Potassium: 4.7 mmol/L (ref 3.5–5.1)
Sodium: 136 mmol/L (ref 135–145)

## 2023-11-24 LAB — URINALYSIS, W/ REFLEX TO CULTURE (INFECTION SUSPECTED)
Bilirubin Urine: NEGATIVE
Glucose, UA: NEGATIVE mg/dL
Hgb urine dipstick: NEGATIVE
Ketones, ur: NEGATIVE mg/dL
Nitrite: NEGATIVE
Protein, ur: 30 mg/dL — AB
Specific Gravity, Urine: 1.011 (ref 1.005–1.030)
WBC, UA: >50 WBC/hpf (ref 0–5)
pH: 6 (ref 5.0–8.0)

## 2023-11-24 LAB — TSH: TSH: 8.81 u[IU]/mL — ABNORMAL HIGH (ref 0.350–4.500)

## 2023-11-24 LAB — CBC WITH DIFFERENTIAL/PLATELET
Abs Immature Granulocytes: 0.05 K/uL (ref 0.00–0.07)
Basophils Absolute: 0.1 K/uL (ref 0.0–0.1)
Basophils Relative: 1 %
Eosinophils Absolute: 0 K/uL (ref 0.0–0.5)
Eosinophils Relative: 0 %
HCT: 32.4 % — ABNORMAL LOW (ref 36.0–46.0)
Hemoglobin: 10.4 g/dL — ABNORMAL LOW (ref 12.0–15.0)
Immature Granulocytes: 1 %
Lymphocytes Relative: 9 %
Lymphs Abs: 0.8 K/uL (ref 0.7–4.0)
MCH: 28.8 pg (ref 26.0–34.0)
MCHC: 32.1 g/dL (ref 30.0–36.0)
MCV: 89.8 fL (ref 80.0–100.0)
Monocytes Absolute: 1.2 K/uL — ABNORMAL HIGH (ref 0.1–1.0)
Monocytes Relative: 13 %
Neutro Abs: 7.1 K/uL (ref 1.7–7.7)
Neutrophils Relative %: 76 %
Platelets: 351 K/uL (ref 150–400)
RBC: 3.61 MIL/uL — ABNORMAL LOW (ref 3.87–5.11)
RDW: 14.9 % (ref 11.5–15.5)
WBC: 9.2 K/uL (ref 4.0–10.5)
nRBC: 0 % (ref 0.0–0.2)

## 2023-11-24 LAB — FERRITIN: Ferritin: 131 ng/mL (ref 11–307)

## 2023-11-24 LAB — IRON AND TIBC
Iron: 13 ug/dL — ABNORMAL LOW (ref 28–170)
Saturation Ratios: 5 % — ABNORMAL LOW (ref 10.4–31.8)
TIBC: 258 ug/dL (ref 250–450)
UIBC: 245 ug/dL

## 2023-11-24 LAB — T4, FREE: Free T4: 1.16 ng/dL — ABNORMAL HIGH (ref 0.61–1.12)

## 2023-11-24 LAB — PHOSPHORUS: Phosphorus: 4.1 mg/dL (ref 2.5–4.6)

## 2023-11-24 LAB — CG4 I-STAT (LACTIC ACID)
Lactic Acid, Venous: 1.5 mmol/L (ref 0.5–1.9)
Lactic Acid, Venous: 1.9 mmol/L (ref 0.5–1.9)

## 2023-11-24 LAB — AMMONIA: Ammonia: <13 umol/L (ref 9–35)

## 2023-11-24 MED ORDER — LACTATED RINGERS IV BOLUS
1000.0000 mL | Freq: Once | INTRAVENOUS | Status: AC
  Administered 2023-11-24: 1000 mL via INTRAVENOUS

## 2023-11-24 MED ORDER — SODIUM CHLORIDE 0.9 % IV SOLN
1.0000 g | Freq: Once | INTRAVENOUS | Status: AC
  Administered 2023-11-24: 1 g via INTRAVENOUS
  Filled 2023-11-24: qty 10

## 2023-11-24 MED ORDER — IOHEXOL 300 MG/ML  SOLN
80.0000 mL | Freq: Once | INTRAMUSCULAR | Status: AC | PRN
  Administered 2023-11-24: 80 mL via INTRAVENOUS

## 2023-11-24 MED ORDER — DIVALPROEX SODIUM 125 MG PO CSDR: 125.0000 mg | DELAYED_RELEASE_CAPSULE | Freq: Two times a day (BID) | ORAL | Status: DC | PRN

## 2023-11-24 MED ORDER — TRAMADOL HCL 50 MG PO TABS
50.0000 mg | ORAL_TABLET | Freq: Three times a day (TID) | ORAL | Status: DC | PRN
  Administered 2023-11-25 – 2023-11-29 (×9): 50 mg via ORAL
  Filled 2023-11-24 (×9): qty 1

## 2023-11-24 MED ORDER — RIVAROXABAN 15 MG PO TABS
15.0000 mg | ORAL_TABLET | Freq: Every evening | ORAL | Status: DC
  Administered 2023-11-24 – 2023-11-28 (×5): 15 mg via ORAL
  Filled 2023-11-24 (×5): qty 1

## 2023-11-24 MED ORDER — ONDANSETRON HCL 4 MG/2ML IJ SOLN: 4.0000 mg | Freq: Four times a day (QID) | INTRAMUSCULAR | Status: DC | PRN

## 2023-11-24 MED ORDER — QUETIAPINE FUMARATE 25 MG PO TABS
25.0000 mg | ORAL_TABLET | Freq: Every day | ORAL | Status: DC
  Administered 2023-11-24 – 2023-11-28 (×5): 25 mg via ORAL
  Filled 2023-11-24 (×5): qty 1

## 2023-11-24 MED ORDER — ACETAMINOPHEN 325 MG PO TABS: 650.0000 mg | ORAL_TABLET | Freq: Four times a day (QID) | ORAL | Status: DC | PRN

## 2023-11-24 MED ORDER — ALBUTEROL SULFATE (2.5 MG/3ML) 0.083% IN NEBU: 2.5000 mg | INHALATION_SOLUTION | RESPIRATORY_TRACT | Status: DC | PRN

## 2023-11-24 MED ORDER — DILTIAZEM HCL-DEXTROSE 125-5 MG/125ML-% IV SOLN (PREMIX)
5.0000 mg/h | INTRAVENOUS | Status: DC
  Administered 2023-11-24: 5 mg/h via INTRAVENOUS
  Filled 2023-11-24 (×2): qty 125

## 2023-11-24 MED ORDER — PANTOPRAZOLE SODIUM 40 MG PO TBEC
40.0000 mg | DELAYED_RELEASE_TABLET | Freq: Every day | ORAL | Status: DC
  Administered 2023-11-25 – 2023-11-29 (×5): 40 mg via ORAL
  Filled 2023-11-24 (×5): qty 1

## 2023-11-24 MED ORDER — ONDANSETRON HCL 4 MG PO TABS: 4.0000 mg | ORAL_TABLET | Freq: Four times a day (QID) | ORAL | Status: DC | PRN

## 2023-11-24 MED ORDER — DONEPEZIL HCL 10 MG PO TABS
10.0000 mg | ORAL_TABLET | Freq: Every evening | ORAL | Status: DC
  Administered 2023-11-24 – 2023-11-28 (×5): 10 mg via ORAL
  Filled 2023-11-24 (×4): qty 1
  Filled 2023-11-24: qty 2

## 2023-11-24 MED ORDER — POLYETHYLENE GLYCOL 3350 17 G PO PACK
17.0000 g | PACK | Freq: Every day | ORAL | Status: DC
  Administered 2023-11-25 – 2023-11-29 (×4): 17 g via ORAL
  Filled 2023-11-24 (×5): qty 1

## 2023-11-24 MED ORDER — DILTIAZEM LOAD VIA INFUSION
10.0000 mg | Freq: Once | INTRAVENOUS | Status: AC
  Administered 2023-11-24: 10 mg via INTRAVENOUS
  Filled 2023-11-24: qty 10

## 2023-11-24 MED ORDER — SODIUM CHLORIDE 0.9 % IV SOLN
1.0000 g | INTRAVENOUS | Status: DC
  Administered 2023-11-25: 1 g via INTRAVENOUS
  Filled 2023-11-24: qty 10

## 2023-11-24 MED ORDER — SODIUM CHLORIDE 0.9 % IV SOLN
500.0000 mg | INTRAVENOUS | Status: DC
  Administered 2023-11-25 (×2): 500 mg via INTRAVENOUS
  Filled 2023-11-24 (×2): qty 5

## 2023-11-24 MED ORDER — ACETAMINOPHEN 650 MG RE SUPP: 650.0000 mg | Freq: Four times a day (QID) | RECTAL | Status: DC | PRN

## 2023-11-24 MED ORDER — LORAZEPAM 0.5 MG PO TABS
0.5000 mg | ORAL_TABLET | Freq: Two times a day (BID) | ORAL | Status: DC | PRN
  Administered 2023-11-25 – 2023-11-26 (×2): 1 mg via ORAL
  Administered 2023-11-28 – 2023-11-29 (×3): 0.5 mg via ORAL
  Filled 2023-11-24: qty 1
  Filled 2023-11-24: qty 2
  Filled 2023-11-24: qty 1
  Filled 2023-11-24: qty 2
  Filled 2023-11-24: qty 1

## 2023-11-24 MED ORDER — MEMANTINE HCL 10 MG PO TABS
5.0000 mg | ORAL_TABLET | Freq: Every day | ORAL | Status: DC
  Administered 2023-11-25 – 2023-11-29 (×5): 5 mg via ORAL
  Filled 2023-11-24 (×5): qty 1

## 2023-11-24 MED ORDER — SODIUM CHLORIDE 0.9 % IV SOLN: INTRAVENOUS | Status: AC

## 2023-11-24 NOTE — Assessment & Plan Note (Signed)
-   most likely multifactorial secondary to combination of  infection   mild dehydration secondary to decreased by mouth intake,  polypharmacy   - Will rehydrate   - treat underlining infection    - Hold contributing medications   - if no improvement may need further imaging to evaluate for CNS pathology pathology such as MRI of the brain  For the past 2 weeks patient has been altered she has been more independent  prior to that 2 weeks ago  - neurological exam appears to be nonfocal but patient unable to cooperate fully   - VBG   ammonia  ordered

## 2023-11-24 NOTE — Assessment & Plan Note (Signed)
 Obtain anemia panel  Transfuse for Hg <7 , rapidly dropping or  if symptomatic

## 2023-11-24 NOTE — Assessment & Plan Note (Signed)
- -  Patient presenting with  productive cough,   , and infiltrate in   lower lobe on chest x-ray        will admit for treatment of CAP will start on appropriate antibiotic coverage. - Rocephin /azithromycin  but unlikely                  blood cultures and sputum cultures ordered                   strep pneumo UA antigen,                  check for Legionella antigen.                Provide oxygen as needed.

## 2023-11-24 NOTE — ED Provider Notes (Signed)
 [facility] AT [facility] Provider Note   CSN: [id number] Arrival date & time: 11/24/23  1238     Patient presents with: Altered Mental Status   [name] is a 89 y.o. adult.   HPI      89 year old female with a history of osteoarthritis, GERD, hyperlipidemia, hypertension, paroxysmal atrial fibrillation, dementia, recent fall with admission to the hospital November 2 to November 10 with UTI, arthritis discharged to skilled nursing facility who presents after she was discharged from rehab at [facility] back to [facility] with concern for confusion more than her prior baseline and leg pain.  Memory Care at [facility] and rehab comes there-11/17   Vannie down the hall for the past 3 days Then today was sleepier and complaining about her legs again, seems like lower leg knees down In last 4 months has been tough guessing where she is having pain No fevers, cough, known abdominal pain, no nausea or vomiting Was still in the bed, does not thinks she has fallen again   Past Medical History:  Diagnosis Date   AR (allergic rhinitis)    Arthritis    Bilateral carotid artery stenosis    MILD --  40% BILATERAL ICA PER DOPPLER JULY 2014   Diverticulosis    Frequency of urination    GERD (gastroesophageal reflux disease)    History of colon polyps    ADENOMATOUS   History of Helicobacter pylori infection    History of shingles    History of TIA (transient ischemic attack)    Hyperlipidemia    Hypertension    Impaired memory    Interstitial cystitis    Iron deficiency    Nocturia    Osteopenia    PAF (paroxysmal atrial fibrillation) (HCC)    dx 07/ 2015   Pelvic pain in female    Urgency of urination    Vitamin D  deficiency    Wears glasses      Prior to Admission medications   Medication Sig Start Date End Date Taking? Authorizing Provider  Cholecalciferol  (VITAMIN D3) 1000 UNITS CAPS Take 1,000 Units by mouth daily.     Yes [provider]  divalproex (DEPAKOTE SPRINKLE) 125 MG capsule Take 125 mg by mouth 2 (two) times daily as needed (agitation). 07/15/23  Yes [provider]  donepezil  (ARICEPT ) 10 MG tablet Take 10 mg by mouth every evening. 08/29/20  Yes [provider]  hydrOXYzine  (ATARAX ) 25 MG tablet Take 1 tablet (25 mg total) by mouth at bedtime as needed. Patient taking differently: Take 25 mg by mouth every evening. 11/15/23  Yes Donnamarie Lebron PARAS, MD  LORazepam  (ATIVAN ) 1 MG tablet Take 1 mg by mouth 2 (two) times daily.   Yes [provider]  losartan  (COZAAR ) 25 MG tablet Take 25 mg by mouth every Monday, Wednesday, and Friday.   Yes [provider]  memantine  (NAMENDA ) 5 MG tablet Take 5 mg by mouth daily.   Yes [provider]  mirabegron  ER (MYRBETRIQ ) 50 MG TB24 tablet Take 50 mg by mouth daily. 12/25/14  Yes [provider]  pantoprazole  (PROTONIX ) 40 MG tablet Take 40 mg by mouth daily.   Yes [provider]  pentosan polysulfate (ELMIRON ) 100 MG capsule Take 100 mg by mouth daily. 08/27/15  Yes [provider]  polyethylene glycol (MIRALAX  / GLYCOLAX ) 17 g packet Take 17 g by mouth daily.   Yes [provider]  QUEtiapine (SEROQUEL) 25 MG tablet Take  25 mg by mouth at bedtime. 09/07/23  Yes [provider]  Rivaroxaban  (XARELTO ) 15 MG TABS tablet Take 1 tablet (15 mg total) by mouth daily with supper. Patient taking differently: Take 15 mg by mouth every evening. 07/17/13  Yes Buriev, Algis SAILOR, MD  traMADol  (ULTRAM ) 50 MG tablet Take 50 mg by mouth 3 (three) times daily as needed for moderate pain (pain score 4-6) or severe pain (pain score 7-10).   Yes [provider]    Allergies: Ambien  [zolpidem  tartrate], Celexa [citalopram], Cymbalta [duloxetine hcl], Ditropan xl [oxybutynin chloride], Lescol [fluvastatin], Lipitor [atorvastatin], Lyrica  [pregabalin ], Neurontin [gabapentin],  Pravastatin, Tape, Zetia [ezetimibe], and Zocor [simvastatin]    Review of Systems  Updated Vital Signs BP 117/71   Pulse (!) 55   Temp 98.9 F (37.2 C)   Resp 16   SpO2 100%   Physical Exam Vitals and nursing note reviewed.  Constitutional:      General: She is not in acute distress.    Appearance: She is well-developed. She is not diaphoretic.  HENT:     Head: Normocephalic and atraumatic.  Eyes:     Conjunctiva/sclera: Conjunctivae normal.  Cardiovascular:     Rate and Rhythm: Normal rate and regular rhythm.     Heart sounds: Normal heart sounds. No murmur heard.    No friction rub. No gallop.  Pulmonary:     Effort: Pulmonary effort is normal. No respiratory distress.     Breath sounds: Normal breath sounds. No wheezing or rales.  Abdominal:     General: There is distension.     Palpations: Abdomen is soft.     Tenderness: There is abdominal tenderness. There is no guarding.  Musculoskeletal:        General: Tenderness (bilateral lower extremity pain, no focal tenderness-normal pulses, pain with ranging bilateral hips) present.     Cervical back: Normal range of motion.  Skin:    General: Skin is warm and dry.     Findings: No erythema or rash.  Neurological:     Comments: Sleepy but following commands Normal distal strength LE, normal UE strength     (all labs ordered are listed, but only abnormal results are displayed) Labs Reviewed  CBC WITH DIFFERENTIAL/PLATELET - Abnormal; Notable for the following components:      Result Value   RBC 3.61 (*)    Hemoglobin 10.4 (*)    HCT 32.4 (*)    Monocytes Absolute 1.2 (*)    All other components within normal limits  COMPREHENSIVE METABOLIC PANEL WITH GFR - Abnormal; Notable for the following components:   Glucose, Bld 112 (*)    Creatinine, Ser 1.22 (*)    GFR, Estimated 42 (*)    All other components within normal limits  URINALYSIS, W/ REFLEX TO CULTURE (INFECTION SUSPECTED) - Abnormal; Notable for the  following components:   APPearance CLOUDY (*)    Protein, ur 30 (*)    Leukocytes,Ua LARGE (*)    Bacteria, UA RARE (*)    All other components within normal limits  VITAMIN B12 - Abnormal; Notable for the following components:   Vitamin B-12 2,616 (*)    All other components within normal limits  TSH - Abnormal; Notable for the following components:   TSH 8.810 (*)    All other components within normal limits  BASIC METABOLIC PANEL WITH GFR - Abnormal; Notable for the following components:   Glucose, Bld 117 (*)    Creatinine, Ser 1.08 (*)  GFR, Estimated 49 (*)    All other components within normal limits  URINE CULTURE  CK  AMMONIA  VITAMIN B1  T4, FREE  MAGNESIUM   PHOSPHORUS  VITAMIN B12  FOLATE  IRON AND TIBC  FERRITIN  RETICULOCYTES  T3  I-STAT CG4 LACTIC ACID, ED  I-STAT CG4 LACTIC ACID, ED    EKG: EKG Interpretation Date/Time:  Wednesday November 24 2023 16:44:32 EST Ventricular Rate:  140 PR Interval:  213 QRS Duration:  96 QT Interval:  295 QTC Calculation: 451 R Axis:   35  Text Interpretation: Atrial fibrillation with rapid V-rate Minimal ST depression Since prior ecg, she is no in atrial fibrillation Confirmed by Dreama Longs ([id number]) on 11/24/2023 4:49:55 PM  Radiology: [facility] CHEST PORT 1 VIEW Result Date: 11/24/2023 EXAM: 1 VIEW(S) XRAY OF THE CHEST 11/24/2023 08:46:00 PM COMPARISON: 11/07/2023 CLINICAL HISTORY: Confusion FINDINGS: LUNGS AND PLEURA: Left basilar patchy opacity. Trace left pleural effusion. No pneumothorax. HEART AND MEDIASTINUM: Atherosclerotic calcifications. No acute abnormality of the cardiac and mediastinal silhouettes. BONES AND SOFT TISSUES: No acute osseous abnormality. IMPRESSION: 1. Left basilar patchy opacity, which may reflect infection or atelectasis. 2. Trace left pleural effusion. Electronically signed by: Morgane Naveau MD 11/24/2023 08:54 PM EST RP Workstation: [id number]   CT ABDOMEN PELVIS W CONTRAST Result Date:  11/24/2023 EXAM: CT ABDOMEN AND PELVIS WITH CONTRAST 11/24/2023 04:26:51 PM TECHNIQUE: CT of the abdomen and pelvis was performed with the administration of 80 mL iohexol  (OMNIPAQUE ) 300 MG/ML solution. Multiplanar reformatted images are provided for review. Automated exposure control, iterative reconstruction, and/or weight-based adjustment of the mA/kV was utilized to reduce the radiation dose to as low as reasonably achievable. COMPARISON: None available. CLINICAL HISTORY: Abdominal pain, confusion; fall; inability to walk FINDINGS: LOWER CHEST: Coronary and descending thoracic aortic atherosclerotic vascular calcifications. Calcified granuloma posteriorly in the left lower lobe. Mild dependent atelectasis in both lower lobes. Mild cardiomegaly. LIVER: Stable hypodense liver lesions, likely cysts, warranting no further imaging workup. GALLBLADDER AND BILE DUCTS: Gallbladder is unremarkable. No biliary ductal dilatation. SPLEEN: No acute abnormality. PANCREAS: No acute abnormality. ADRENAL GLANDS: No acute abnormality. KIDNEYS, URETERS AND BLADDER: 2.7 cm simple right mid kidney cyst warrants no further imaging workup. Prominent bilateral hydronephrosis and moderate bilateral hydroureter extending down to the distended urinary bladder. The urinary bladder measures 17.2 x 10.8 x 11.4 cm, with a calculated volume of 1109 ml. Streak artifact in the patient's left hip implant partially obscures the vicinity of the urethra. The appearance raises concern for possible bladder outlet or urethral obstruction. No urinary tract calculi identified. No perinephric or periureteral stranding. GI AND BOWEL: Stomach demonstrates no acute abnormality. There is no bowel obstruction. No current findings of bowel distress such as pneumatosis, bowel wall thickening, or mesenteric edema. Low position of the anorectal junction suggesting pelvic floor laxity. PERITONEUM AND RETROPERITONEUM: No ascites. No free air. VASCULATURE:  Considerable abdominal aortic atheromatous vascular calcification noted. Substantial atheromatous plaque severely narrowing the proximal superior mesenteric artery; poor visualization of the proximal celiac trunk which is likely occluded, with reconstitution via collaterals from the superior mesenteric artery. This predisposes the patient to mesenteric ischemia including chronic mesenteric ischemia. LYMPH NODES: No lymphadenopathy. REPRODUCTIVE ORGANS: Uterus absent. The possibility of mucosal thickening along the vaginal vestibule is not excluded. BONES AND SOFT TISSUES: Loss of disc height at L4-L5 with lower lumbar spondylosis. 3 mm grade 1 degenerative anterolisthesis at L5-S1. Left total hip prosthesis. No focal soft tissue abnormality. IMPRESSION: 1. Prominent bilateral hydronephrosis and moderate  bilateral hydroureter to a markedly distended urinary bladder, concerning for bladder outlet or urethral obstruction; no urinary tract calculi identified. 2. 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 3. Loss of disc height at L4-L5 with lower lumbar spondylosis. 4. Grade 1 degenerative anterolisthesis at L5-S1. Electronically signed by: Ryan Salvage MD 11/24/2023 05:03 PM EST RP Workstation: [id number]   CT Head Wo Contrast Result Date: 11/24/2023 EXAM: CT HEAD WITHOUT CONTRAST 11/24/2023 04:26:51 PM TECHNIQUE: CT of the head was performed without the administration of intravenous contrast. Automated exposure control, iterative reconstruction, and/or weight based adjustment of the mA/kV was utilized to reduce the radiation dose to as low as reasonably achievable. COMPARISON: CT head without contrast 11/07/2023. MR head without contrast 11/07/2023. CLINICAL HISTORY: Delirium FINDINGS: BRAIN AND VENTRICLES: Moderate generalized cerebral and  cerebellar volume loss. Moderate periventricular and deep cerebral white matter disease. Atherosclerotic calcifications in cavernous internal carotid arteries. No acute hemorrhage. No evidence of acute infarct. No hydrocephalus. No extra-axial collection. No mass effect or midline shift. ORBITS: Bilateral lens replacement. SINUSES: Mucosal thickening in bilateral ethmoid sinuses. Trace left mastoid effusion. SOFT TISSUES AND SKULL: No acute soft tissue abnormality. No skull fracture. IMPRESSION: 1. No acute intracranial abnormality. 2. Stable moderate  generalized cerebral and cerebellar volume loss. 3. Stable moderate periventricular and deep cerebral white matter disease. Electronically signed by: Lonni Necessary MD 11/24/2023 04:44 PM EST RP Workstation: [id number]   CT Cervical Spine Wo Contrast Result Date: 11/24/2023 EXAM: CT CERVICAL SPINE WITH CONTRAST 11/24/2023 04:26:51 PM TECHNIQUE: CT of the cervical spine was performed with the administration of 80 mL of iohexol  (OMNIPAQUE ) 300 MG/ML solution. Multiplanar reformatted images are provided for review. Automated exposure control, iterative reconstruction, and/or weight based adjustment of the mA/kV was utilized to reduce the radiation dose to as low as reasonably achievable. COMPARISON: CT cervical spine 11/30/2023. CLINICAL HISTORY: Neck trauma (Age >= 65y); reports leg pain. Fall. Increased confusion. FINDINGS: CERVICAL SPINE: BONES AND ALIGNMENT: No acute fracture or traumatic malalignment. Ankylosis of the cervical spine is present at C2-C3 and C4-C5. Slight degenerative anterolisthesis present at C4-C5. Straightening of the normal cervical lordosis is present. DEGENERATIVE CHANGES: Ankylosis of the cervical spine is present at C2-C3 and C4-C5. Slight degenerative anterolisthesis present at C4-C5. Straightening of the normal cervical lordosis is present. SOFT TISSUES: No prevertebral soft tissue swelling. Atherosclerotic calcifications are  present at the carotid bifurcations bilaterally without significant stenosis. IMPRESSION: 1. No acute abnormality of the cervical spine related to the reported neck trauma. 2. Ankylosis of the cervical spine at C2-3 and C4-5 with slight degenerative anterolisthesis at C4-5. 3. Straightening of the normal cervical lordosis. Electronically signed by: Lonni Necessary MD 11/24/2023 04:42 PM EST RP Workstation: [id number]   VAS US  LOWER EXTREMITY VENOUS (DVT) (ONLY [facility] & [facility]) Result Date: 11/24/2023  Lower Venous DVT Study Patient Name:  KEISHANA KLINGER Divirgilio  Date of Exam:   11/24/2023 Medical Rec #: [id number]        Accession #:    [id number] Date of Birth: 10/04/1933        Patient Gender: F Patient Age:   42 years Exam Location:  [facility] Procedure:      VAS US  LOWER EXTREMITY VENOUS (DVT) Referring Phys: ROCKY MASSY --------------------------------------------------------------------------------  Indications: Pain.  Risk Factors: None identified. Limitations: Poor ultrasound/tissue interface and patient positioning, patient immobility, patient pain tolerance. Comparison Study: No prior  studies. Performing Technologist: Cordella Collet RVT  Examination Guidelines: A complete evaluation includes B-mode imaging, spectral Doppler, color Doppler, and power Doppler as needed of all accessible portions of each vessel. Bilateral testing is considered an integral part of a complete examination. Limited examinations for reoccurring indications may be performed as noted. The reflux portion of the exam is performed with the patient in reverse Trendelenburg.  +---------+---------------+---------+-----------+----------+--------------+ RIGHT    CompressibilityPhasicitySpontaneityPropertiesThrombus Aging +---------+---------------+---------+-----------+----------+--------------+ CFV      Full           Yes      Yes                                  +---------+---------------+---------+-----------+----------+--------------+ SFJ      Full                                                        +---------+---------------+---------+-----------+----------+--------------+ FV Prox  Full                                                        +---------+---------------+---------+-----------+----------+--------------+ FV Mid   Full                                                        +---------+---------------+---------+-----------+----------+--------------+ FV Distal               Yes      Yes                                 +---------+---------------+---------+-----------+----------+--------------+ PFV      Full                                                        +---------+---------------+---------+-----------+----------+--------------+ POP      Full           Yes      Yes                                 +---------+---------------+---------+-----------+----------+--------------+ PTV      Full                                                        +---------+---------------+---------+-----------+----------+--------------+ PERO     Full                                                        +---------+---------------+---------+-----------+----------+--------------+   +---------+---------------+---------+-----------+----------+-------------------+  LEFT     CompressibilityPhasicitySpontaneityPropertiesThrombus Aging      +---------+---------------+---------+-----------+----------+-------------------+ CFV      Full           Yes      Yes                                      +---------+---------------+---------+-----------+----------+-------------------+ SFJ      Full                                                             +---------+---------------+---------+-----------+----------+-------------------+ FV Prox  Full                                                              +---------+---------------+---------+-----------+----------+-------------------+ FV Mid   Full                                                             +---------+---------------+---------+-----------+----------+-------------------+ FV Distal               Yes      Yes                                      +---------+---------------+---------+-----------+----------+-------------------+ PFV      Full                                                             +---------+---------------+---------+-----------+----------+-------------------+ POP      Full           Yes      Yes                                      +---------+---------------+---------+-----------+----------+-------------------+ PTV      Full                                                             +---------+---------------+---------+-----------+----------+-------------------+ PERO                                                  Not well visualized +---------+---------------+---------+-----------+----------+-------------------+     Summary: RIGHT: - There is no evidence of  deep vein thrombosis in the lower extremity. However, portions of this examination were limited- see technologist comments above.  - No cystic structure found in the popliteal fossa.  LEFT: - There is no evidence of deep vein thrombosis in the lower extremity. However, portions of this examination were limited- see technologist comments above.  - No cystic structure found in the popliteal fossa.  *See table(s) above for measurements and observations. Electronically signed by Lonni Gaskins MD on 11/24/2023 at 4:29:06 PM.    Final      Procedures   Medications Ordered in the ED  diltiazem  (CARDIZEM ) 1 mg/mL load via infusion 10 mg (10 mg Intravenous Bolus from Bag 11/24/23 1736)    And  diltiazem  (CARDIZEM ) 125 mg in dextrose  5% 125 mL (1 mg/mL) infusion (5 mg/hr Intravenous Rate/Dose Change 11/24/23 1832)  lactated ringers   bolus 1,000 mL (1,000 mLs Intravenous New Bag/Given 11/24/23 1627)  iohexol  (OMNIPAQUE ) 300 MG/ML solution 80 mL (80 mLs Intravenous Contrast Given 11/24/23 1600)  cefTRIAXone  (ROCEPHIN ) 1 g in sodium chloride  0.9 % 100 mL IVPB (1 g Intravenous New Bag/Given 11/24/23 1904)                                     89 year old female with a history of osteoarthritis, GERD, hyperlipidemia, hypertension, paroxysmal atrial fibrillation, dementia, recent fall with admission to the hospital November 2 to November 10 with UTI, arthritis discharged to skilled nursing facility who presents after she was discharged from rehab at [facility] back to [place] with concern for confusion more than her prior baseline and leg pain.   DDx includes arrhythmia, anemia, electrolyte abnormalities, UTI, spinal abnormality, neuropathy, arterial occlusion, DVT, AAA, psoas abscess, other intraabdominal abnormality, septic arthritis, ICH, disc disease, rhabdomyolysis, medication effects.   EKG completed initially with sinus rhythm.  DVT study negative. Pulses present, doubt acute arterial thrombus. Overall low suspicion for septic arthritis.   CT head/CSpine without signs of ICH, does show ankylosis cervical spine, degenerative anterolisthesis.  Labs without clinically significant electrolyte abnormalities. Ammonia negative, CK negative.  B1/12/TSH/UA pending.  CT abdomen pelvis pending.  At time of signout, has gone into atrial fibrillation with RVR. Diltiazem  gtt ordered. Dr. Randol assuming care.      Final diagnoses:  Somnolence  Pain in both lower extremities  Atrial fibrillation with RVR (HCC)  Urinary retention  Acute cystitis without hematuria    ED Discharge Orders     None          Dreama Longs, MD 11/24/23 2219

## 2023-11-24 NOTE — Assessment & Plan Note (Signed)
 Sp foley catheter Will need to follow up with urology

## 2023-11-24 NOTE — Assessment & Plan Note (Signed)
Pt ot eval  

## 2023-11-24 NOTE — Subjective & Objective (Signed)
 Coming in from SNF recently was admitted for UTI Had a fall was discharged on the 10th to SNF Has been more sleepy over the past few days, more confused, no fever, legs achy Family did not report urinary complaints initially Initial workup included CT head and Dopplers of lower ext were non acute  UA was worrisome for UTI  CT abd showed bilateral hydro and bladder distention Noted to have urinary retention Foley was ordered

## 2023-11-24 NOTE — ED Triage Notes (Signed)
 Pt BIB EMS from [facility] due to increased confusion. Facility reports pt left facility for rehab at [facility] due to fall and came back more confused than baseline. Hx of Dementia. Facility noted pt normally walks with assistance, pt could not walk today. Pt expresses pain when touching upper legs. AAOx 1

## 2023-11-24 NOTE — H&P (Incomplete)
 Christine [name] FMW:[id number] DOB: [birth year] DOA: 11/24/2023     PCP: Shayne Anes, MD    Urology Dr. Janit Patient arrived to ER on 11/24/23 at 1238 Referred by Attending Randol Simmonds, MD   Patient coming from:    From facility   [facility] [address] Memory care    Chief Complaint:   Chief Complaint  Patient presents with   Altered Mental Status    HPI: Christine [name] is a 89 y.o. adult with medical history significant of Dementia, CAD atrial fibrillation on Xarelto , GERD falls hypertension recurrent UTIs recurrent falls history of TIA    Presented with increased confusion and lethargy and legs pain Coming in from SNF recently was admitted for UTI Had a fall was discharged on the 10th to SNF Has been more sleepy over the past few days, more confused, no fever, legs achy Family did not report urinary complaints initially Initial workup included CT head and Dopplers of lower ext were non acute  UA was worrisome for UTI  CT abd showed bilateral hydro and bladder distention Noted to have urinary retention Foley was ordered     Patient had very similar admission in beginning of November    Family denies fever or chills  Patient has been living alone up until September Family worried she may aspirating she coughs when she eats  Patient just got moved from SNF back to memory care for about 21 hours and immediately family was told that she was altered. Family states that this is her new normal for the past 2 weeks. The mental status has not cleared back to baseline ever since previous admission .    Denies significant ETOH intake   Does not smoke    Regarding pertinent Chronic problems:    Hyperlipidemia -  on statins  Repatha Lipid Panel     Component Value Date/Time   CHOL 161 11/08/2023 0129   TRIG 52 11/08/2023 0129   HDL 73 11/08/2023 0129   CHOLHDL 2.2 11/08/2023 0129   VLDL 10 11/08/2023 0129   LDLCALC 78 11/08/2023 0129     HTN on Cozaar   Lopressor    last echo  Recent Results (from the past 56199 hours)  ECHOCARDIOGRAM COMPLETE   Collection Time: 11/08/23 10:00 AM  Result Value   Weight 1,989.43   Height 64   BP 148/88   S' Lateral 2.40   AR max vel 2.29   AV Peak grad 9.5   Ao pk vel 1.54   Area-P 1/2 4.96   Est EF 55 - 60%   Narrative      ECHOCARDIOGRAM REPORT     IMPRESSIONS    1. Left ventricular ejection fraction, by estimation, is 55 to 60%. The left ventricle has normal function. The left ventricle has no regional wall motion abnormalities. Indeterminate diastolic filling due to E-A fusion.  2. Right ventricular systolic function is normal. The right ventricular size is normal.  3. The mitral valve is normal in structure. Mild to moderate mitral valve regurgitation. No evidence of mitral stenosis.  4. The aortic valve is normal in structure. There is moderate calcification of the aortic valve. There is mild thickening of the aortic valve. Aortic valve regurgitation is trivial. No aortic stenosis is present.  5. The inferior vena cava is dilated in size with <50% respiratory variability, suggesting right atrial pressure of 15 mmHg.          You have  CAD  -                  -  followed by cardiology             Hypothyroidism:   Lab Results  Component Value Date   TSH 8.810 (H) 11/24/2023         A. Fib -   atrial fibrillation CHA2DS2 vas score   8       current  on anticoagulation with  Xarelto          -  Rate control:  Currently controlled with  Metoprolol ,       CKD stage IIIa baseline Cr 1.2 CrCl cannot be calculated (Unknown ideal weight.).  Lab Results  Component Value Date   CREATININE 1.22 (H) 11/24/2023   CREATININE 1.05 (H) 11/10/2023   CREATININE 1.17 (H) 11/09/2023   Lab Results  Component Value Date   NA 141 11/24/2023   CL 105 11/24/2023   K 5.0 11/24/2023   CO2 24 11/24/2023   BUN 23 11/24/2023   CREATININE 1.22 (H) 11/24/2023   GFRNONAA 42 (L) 11/24/2023    CALCIUM  9.4 11/24/2023   PHOS 3.1 01/31/2022   ALBUMIN 3.6 11/24/2023   GLUCOSE 112 (H) 11/24/2023         Dementia - on Depakote    Chronic anemia - baseline hg Hemoglobin & Hematocrit  Recent Labs    11/09/23 0531 11/10/23 0448 11/24/23 1421  HGB 10.7* 10.5* 10.4*   Iron/TIBC/Ferritin/ %Sat No results found for: IRON, TIBC, FERRITIN, IRONPCTSAT    While in ER:   Found to have evidence of urinary retention Foley placed Found to have UTI start Rocephin  Was transiently tachy up to 150 and needed diltiazem  drip HR came down after that  Lab Orders         Urine Culture         CBC with Differential         Comprehensive metabolic panel         Urinalysis, w/ Reflex to Culture (Infection Suspected) -Urine, Clean Catch         Vitamin B12         Vitamin B1         CK         Ammonia         TSH         T4, free         I-Stat CG4 Lactic Acid      CT HEAD   NON acute  CT neck non acute     CXR - . Left basilar patchy opacity, which may reflect infection or atelectasis.   CTabd/pelvis - Prominent bilateral hydronephrosis and moderate bilateral hydroureter to a markedly distended urinary bladder, concerning for bladder outlet or urethral obstruction; no urinary tract calculi identified. 2. 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 3. Loss of disc height at L4-L5 with lower lumbar spondylosis. 4. Grade 1 degenerative anterolisthesis at L5-S1.    Following Medications were ordered in ER: Medications  diltiazem  (CARDIZEM ) 1 mg/mL load via infusion 10 mg (10 mg Intravenous Bolus from Bag 11/24/23 1736)    And  diltiazem  (CARDIZEM ) 125 mg in dextrose  5% 125 mL (1 mg/mL) infusion (5 mg/hr Intravenous Rate/Dose Change 11/24/23 1832)  lactated ringers  bolus 1,000 mL (1,000 mLs Intravenous New  Bag/Given 11/24/23 1627)  iohexol  (OMNIPAQUE ) 300 MG/ML solution 80 mL (80 mLs Intravenous Contrast Given 11/24/23 1600)  cefTRIAXone  (ROCEPHIN ) 1 g in sodium  chloride 0.9 % 100 mL IVPB (1 g Intravenous New Bag/Given 11/24/23 1904)       ED Triage Vitals  Encounter Vitals Group     BP 11/24/23 1258 (!) 127/96     Girls Systolic BP Percentile --      Girls Diastolic BP Percentile --      Boys Systolic BP Percentile --      Boys Diastolic BP Percentile --      Pulse Rate 11/24/23 1258 84     Resp 11/24/23 1258 17     Temp 11/24/23 1643 98.8 F (37.1 C)     Temp Source 11/24/23 1258 Oral     SpO2 11/24/23 1258 97 %     Weight --      Height --      Head Circumference --      Peak Flow --      Pain Score --      Pain Loc --      Pain Education --      Exclude from Growth Chart --   UFJK(75)@     _________________________________________ Significant initial  Findings: Abnormal Labs Reviewed  CBC WITH DIFFERENTIAL/PLATELET - Abnormal; Notable for the following components:      Result Value   RBC 3.61 (*)    Hemoglobin 10.4 (*)    HCT 32.4 (*)    Monocytes Absolute 1.2 (*)    All other components within normal limits  COMPREHENSIVE METABOLIC PANEL WITH GFR - Abnormal; Notable for the following components:   Glucose, Bld 112 (*)    Creatinine, Ser 1.22 (*)    GFR, Estimated 42 (*)    All other components within normal limits  URINALYSIS, W/ REFLEX TO CULTURE (INFECTION SUSPECTED) - Abnormal; Notable for the following components:   APPearance CLOUDY (*)    Protein, ur 30 (*)    Leukocytes,Ua LARGE (*)    Bacteria, UA RARE (*)    All other components within normal limits  VITAMIN B12 - Abnormal; Notable for the following components:   Vitamin B-12 2,616 (*)    All other components within normal limits  TSH - Abnormal; Notable for the following components:   TSH 8.810 (*)    All other components within normal limits       Cardiac Panel (last 3 results) Recent Labs     11/24/23 1530  CKTOTAL 84     ECG: Ordered Personally reviewed and interpreted by me showing: HR : 140 Rhythm:Atrial fibrillation with rapid V-rate Minimal ST depression Since prior ecg, she is no in atrial fibrillation QTC 451   The recent clinical data is shown below. Vitals:   11/24/23 1258 11/24/23 1415 11/24/23 1643 11/24/23 1834  BP: (!) 127/96 (!) 140/64 (!) 149/78 117/71  Pulse: 84 77 (!) 150 (!) 55  Resp: 17 (!) 24 (!) 22 16  Temp:   98.8 F (37.1 C) 98.9 F (37.2 C)  TempSrc: Oral  Oral   SpO2: 97% 95% 95% 100%    WBC     Component Value Date/Time   WBC 9.2 11/24/2023 1421   LYMPHSABS 0.8 11/24/2023 1421   MONOABS 1.2 (H) 11/24/2023 1421   EOSABS 0.0 11/24/2023 1421   BASOSABS 0.1 11/24/2023 1421     Lactic Acid, Venous    Component Value Date/Time   LATICACIDVEN 1.9 11/24/2023 1957         UA   evidence of UTI     Urine analysis:    Component  Value Date/Time   COLORURINE YELLOW 11/24/2023 1657   APPEARANCEUR CLOUDY (A) 11/24/2023 1657   LABSPEC 1.011 11/24/2023 1657   PHURINE 6.0 11/24/2023 1657   GLUCOSEU NEGATIVE 11/24/2023 1657   HGBUR NEGATIVE 11/24/2023 1657   BILIRUBINUR NEGATIVE 11/24/2023 1657   KETONESUR NEGATIVE 11/24/2023 1657   PROTEINUR 30 (A) 11/24/2023 1657   UROBILINOGEN 0.2 09/03/2014 1649   NITRITE NEGATIVE 11/24/2023 1657   LEUKOCYTESUR LARGE (A) 11/24/2023 1657     Blood Culture (routine x 2)     Status: None   Collection Time: 11/07/23 10:50 AM   Specimen: BLOOD  Result Value Ref Range Status   Specimen Description BLOOD LEFT ANTECUBITAL  Final   Special Requests   Final    BOTTLES DRAWN AEROBIC AND ANAEROBIC Blood Culture adequate volume   Culture   Final    NO GROWTH 5 DAYS Performed at [facility], [address] [place], [place] [address]    Report Status 11/12/2023 FINAL  Final  Blood Culture (routine x 2)     Status: None   Collection Time: 11/07/23 10:55 AM   Specimen: BLOOD RIGHT HAND  Result  Value Ref Range Status   Specimen Description BLOOD RIGHT HAND  Final   Special Requests   Final    BOTTLES DRAWN AEROBIC ONLY Blood Culture results may not be optimal due to an inadequate volume of blood received in culture bottles   Culture   Final    NO GROWTH 5 DAYS Performed at [facility], [address], [place], [place] [address]    Report Status 11/12/2023 FINAL  Final  Urine Culture (for pregnant, neutropenic or urologic patients or patients with an indwelling urinary catheter)     Status: Abnormal   Collection Time: 11/08/23  1:51 PM   Specimen: Urine, Clean Catch  Result Value Ref Range Status   Specimen Description URINE, CLEAN CATCH  Final   Special Requests   Final    NONE Performed at [facility], [address], [place], [place] [address]    Culture MULTIPLE SPECIES PRESENT, SUGGEST RECOLLECTION (A)  Final   Report Status 11/09/2023 FINAL  Final    ABX started Antibiotics Given (last 72 hours)     Date/Time Action Medication Dose Rate   11/24/23 1904 New Bag/Given   cefTRIAXone  (ROCEPHIN ) 1 g in sodium chloride  0.9 % 100 mL IVPB 1 g 200 mL/hr         __________________________________________________________ Recent Labs  Lab 11/24/23 1421  NA 141  K 5.0  CO2 24  GLUCOSE 112*  BUN 23  CREATININE 1.22*  CALCIUM  9.4    Cr   Up from baseline see below Lab Results  Component Value Date   CREATININE 1.22 (H) 11/24/2023   CREATININE 1.05 (H) 11/10/2023   CREATININE 1.17 (H) 11/09/2023    Recent Labs  Lab 11/24/23 1421  AST 26  ALT 22  ALKPHOS 97  BILITOT 0.5  PROT 7.5  ALBUMIN 3.6   Lab Results  Component Value Date   CALCIUM  9.4 11/24/2023   PHOS 3.1 01/31/2022    Plt: Lab Results  Component Value Date   PLT 351 11/24/2023       Recent Labs  Lab 11/24/23 1421  WBC 9.2  NEUTROABS 7.1  HGB 10.4*  HCT 32.4*  MCV 89.8  PLT 351    HG/HCT   stable,      Component Value Date/Time   HGB 10.4 (L) 11/24/2023  1421    HCT 32.4 (L) 11/24/2023 1421   MCV 89.8 11/24/2023 1421    Recent Labs  Lab 11/24/23 1530  AMMONIA <13      _______________________________________________ Hospitalist was called for admission for   Atrial fibrillation with RVR   Urinary retention  Acute cystitis without hematuria      The following Work up has been ordered so far:  Orders Placed This Encounter  Procedures   Urine Culture   CT Head Wo Contrast   CT Cervical Spine Wo Contrast   CT ABDOMEN PELVIS W CONTRAST   CBC with Differential   Comprehensive metabolic panel   Urinalysis, w/ Reflex to Culture (Infection Suspected) -Urine, Clean Catch   Vitamin B12   Vitamin B1   CK   Ammonia   TSH   T4, free   Check temperature   Insert foley catheter   Consult to hospitalist   I-Stat CG4 Lactic Acid   ED EKG   EKG 12-Lead   VAS US  LOWER EXTREMITY VENOUS (DVT) (ONLY [facility] & [facility])     OTHER Significant initial  Findings:  labs showing:     DM  labs:  HbA1C: Recent Labs    11/08/23 0129  HGBA1C 5.2       CBG (last 3)  No results for input(s): GLUCAP in the last 72 hours.        Cultures:    Component Value Date/Time   SDES URINE, CLEAN CATCH 11/08/2023 1351   SPECREQUEST  11/08/2023 1351    NONE Performed at [facility], [address] [place], [place] [address]    CULT MULTIPLE SPECIES PRESENT, SUGGEST RECOLLECTION (A) 11/08/2023 1351   REPTSTATUS 11/09/2023 FINAL 11/08/2023 1351     Radiological Exams on Admission: CT ABDOMEN PELVIS W CONTRAST Result Date: 11/24/2023 EXAM: CT ABDOMEN AND PELVIS WITH CONTRAST 11/24/2023 04:26:51 PM TECHNIQUE: CT of the abdomen and pelvis was performed with the administration of 80 mL iohexol  (OMNIPAQUE ) 300 MG/ML solution. Multiplanar reformatted images are provided for review. Automated exposure control, iterative reconstruction, and/or weight-based adjustment of the mA/kV was utilized to reduce the radiation dose to as low as reasonably  achievable. COMPARISON: None available. CLINICAL HISTORY: Abdominal pain, confusion; fall; inability to walk FINDINGS: LOWER CHEST: Coronary and descending thoracic aortic atherosclerotic vascular calcifications. Calcified granuloma posteriorly in the left lower lobe. Mild dependent atelectasis in both lower lobes. Mild cardiomegaly. LIVER: Stable hypodense liver lesions, likely cysts, warranting no further imaging workup. GALLBLADDER AND BILE DUCTS: Gallbladder is unremarkable. No biliary ductal dilatation. SPLEEN: No acute abnormality. PANCREAS: No acute abnormality. ADRENAL GLANDS: No acute abnormality. KIDNEYS, URETERS AND BLADDER: 2.7 cm simple right mid kidney cyst warrants no further imaging workup. Prominent bilateral hydronephrosis and moderate bilateral hydroureter extending down to the distended urinary bladder. The urinary bladder measures 17.2 x 10.8 x 11.4 cm, with a calculated volume of 1109 ml. Streak artifact in the patient's left hip implant partially obscures the vicinity of the urethra. The appearance raises concern for possible bladder outlet or urethral obstruction. No urinary tract calculi identified. No perinephric or periureteral stranding. GI AND BOWEL: Stomach demonstrates no acute abnormality. There is no bowel obstruction. No current findings of bowel distress such as pneumatosis, bowel wall thickening, or mesenteric edema. Low position of the anorectal junction suggesting pelvic floor laxity. PERITONEUM AND RETROPERITONEUM: No ascites. No free air. VASCULATURE: Considerable abdominal aortic atheromatous vascular calcification noted. Substantial atheromatous plaque severely narrowing the proximal superior mesenteric artery; poor visualization  of the proximal celiac trunk which is likely occluded, with reconstitution via collaterals from the superior mesenteric artery. This predisposes the patient to mesenteric ischemia including chronic mesenteric ischemia. LYMPH NODES: No  lymphadenopathy. REPRODUCTIVE ORGANS: Uterus absent. The possibility of mucosal thickening along the vaginal vestibule is not excluded. BONES AND SOFT TISSUES: Loss of disc height at L4-L5 with lower lumbar spondylosis. 3 mm grade 1 degenerative anterolisthesis at L5-S1. Left total hip prosthesis. No focal soft tissue abnormality. IMPRESSION: 1. Prominent bilateral hydronephrosis and moderate bilateral hydroureter to a markedly distended urinary bladder, concerning for bladder outlet or urethral obstruction; no urinary tract calculi identified. 2. 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 3. Loss of disc height at L4-L5 with lower lumbar spondylosis. 4. Grade 1 degenerative anterolisthesis at L5-S1. Electronically signed by: Ryan Salvage MD 11/24/2023 05:03 PM EST RP Workstation: [id number]   CT Head Wo Contrast Result Date: 11/24/2023 EXAM: CT HEAD WITHOUT CONTRAST 11/24/2023 04:26:51 PM TECHNIQUE: CT of the head was performed without the administration of intravenous contrast. Automated exposure control, iterative reconstruction, and/or weight based adjustment of the mA/kV was utilized to reduce the radiation dose to as low as reasonably achievable. COMPARISON: CT head without contrast 11/07/2023. MR head without contrast 11/07/2023. CLINICAL HISTORY: Delirium FINDINGS: BRAIN AND VENTRICLES: Moderate generalized cerebral and cerebellar volume loss. Moderate periventricular and deep cerebral white matter disease. Atherosclerotic calcifications in cavernous internal carotid arteries. No acute hemorrhage. No evidence of acute infarct. No hydrocephalus. No extra-axial collection. No mass effect or midline shift. ORBITS: Bilateral lens replacement. SINUSES: Mucosal thickening in bilateral ethmoid sinuses. Trace left mastoid effusion. SOFT TISSUES  AND SKULL: No acute soft tissue abnormality. No skull fracture. IMPRESSION: 1. No acute intracranial abnormality. 2. Stable moderate  generalized cerebral and cerebellar volume loss. 3. Stable moderate periventricular and deep cerebral white matter disease. Electronically signed by: Lonni Necessary MD 11/24/2023 04:44 PM EST RP Workstation: [id number]   CT Cervical Spine Wo Contrast Result Date: 11/24/2023 EXAM: CT CERVICAL SPINE WITH CONTRAST 11/24/2023 04:26:51 PM TECHNIQUE: CT of the cervical spine was performed with the administration of 80 mL of iohexol  (OMNIPAQUE ) 300 MG/ML solution. Multiplanar reformatted images are provided for review. Automated exposure control, iterative reconstruction, and/or weight based adjustment of the mA/kV was utilized to reduce the radiation dose to as low as reasonably achievable. COMPARISON: CT cervical spine 11/30/2023. CLINICAL HISTORY: Neck trauma (Age >= 65y); reports leg pain. Fall. Increased confusion. FINDINGS: CERVICAL SPINE: BONES AND ALIGNMENT: No acute fracture or traumatic malalignment. Ankylosis of the cervical spine is present at C2-C3 and C4-C5. Slight degenerative anterolisthesis present at C4-C5. Straightening of the normal cervical lordosis is present. DEGENERATIVE CHANGES: Ankylosis of the cervical spine is present at C2-C3 and C4-C5. Slight degenerative anterolisthesis present at C4-C5. Straightening of the normal cervical lordosis is present. SOFT TISSUES: No prevertebral soft tissue swelling. Atherosclerotic calcifications are present at the carotid bifurcations bilaterally without significant stenosis. IMPRESSION: 1. No acute abnormality of the cervical spine related to the reported neck trauma. 2. Ankylosis of the cervical spine at C2-3 and C4-5 with slight degenerative anterolisthesis at C4-5. 3. Straightening of the normal cervical lordosis. Electronically signed by: Lonni Necessary MD 11/24/2023 04:42 PM EST RP Workstation: [id number]    VAS US  LOWER EXTREMITY VENOUS (DVT) (ONLY [facility]) Result Date: 11/24/2023  Lower Venous DVT Study Patient Name:  Christine [name]  Date of  Exam:   11/24/2023 Medical Rec #: [id number]        Accession #:    [id number] Date of Birth: [birth year]        Patient Gender: F Patient Age:   45 years Exam Location:  [facility] Procedure:      VAS US  LOWER EXTREMITY VENOUS (DVT) Referring Phys: ROCKY MASSY --------------------------------------------------------------------------------  Indications: Pain.  Risk Factors: None identified. Limitations: Poor ultrasound/tissue interface and patient positioning, patient immobility, patient pain tolerance. Comparison Study: No prior studies. Performing Technologist: Cordella Collet RVT  Examination Guidelines: A complete evaluation includes B-mode imaging, spectral Doppler, color Doppler, and power Doppler as needed of all accessible portions of each vessel. Bilateral testing is considered an integral part of a complete examination. Limited examinations for reoccurring indications may be performed as noted. The reflux portion of the exam is performed with the patient in reverse Trendelenburg.  +---------+---------------+---------+-----------+----------+--------------+ RIGHT    CompressibilityPhasicitySpontaneityPropertiesThrombus Aging +---------+---------------+---------+-----------+----------+--------------+ CFV      Full           Yes      Yes                                 +---------+---------------+---------+-----------+----------+--------------+ SFJ      Full                                                        +---------+---------------+---------+-----------+----------+--------------+ FV Prox  Full                                                        +---------+---------------+---------+-----------+----------+--------------+ FV Mid   Full                                                         +---------+---------------+---------+-----------+----------+--------------+ FV Distal               Yes      Yes                                 +---------+---------------+---------+-----------+----------+--------------+ PFV      Full                                                        +---------+---------------+---------+-----------+----------+--------------+ POP      Full           Yes      Yes                                 +---------+---------------+---------+-----------+----------+--------------+ PTV      Full                                                        +---------+---------------+---------+-----------+----------+--------------+  PERO     Full                                                        +---------+---------------+---------+-----------+----------+--------------+   +---------+---------------+---------+-----------+----------+-------------------+ LEFT     CompressibilityPhasicitySpontaneityPropertiesThrombus Aging      +---------+---------------+---------+-----------+----------+-------------------+ CFV      Full           Yes      Yes                                      +---------+---------------+---------+-----------+----------+-------------------+ SFJ      Full                                                             +---------+---------------+---------+-----------+----------+-------------------+ FV Prox  Full                                                             +---------+---------------+---------+-----------+----------+-------------------+ FV Mid   Full                                                             +---------+---------------+---------+-----------+----------+-------------------+ FV Distal               Yes      Yes                                      +---------+---------------+---------+-----------+----------+-------------------+ PFV      Full                                                              +---------+---------------+---------+-----------+----------+-------------------+ POP      Full           Yes      Yes                                      +---------+---------------+---------+-----------+----------+-------------------+ PTV      Full                                                             +---------+---------------+---------+-----------+----------+-------------------+ PERO  Not well visualized +---------+---------------+---------+-----------+----------+-------------------+     Summary: RIGHT: - There is no evidence of deep vein thrombosis in the lower extremity. However, portions of this examination were limited- see technologist comments above.  - No cystic structure found in the popliteal fossa.  LEFT: - There is no evidence of deep vein thrombosis in the lower extremity. However, portions of this examination were limited- see technologist comments above.  - No cystic structure found in the popliteal fossa.  *See table(s) above for measurements and observations. Electronically signed by Lonni Gaskins MD on 11/24/2023 at 4:29:06 PM.    Final    _______________________________________________________________________________________________________ Latest  Blood pressure 117/71, pulse (!) 55, temperature 98.9 F (37.2 C), resp. rate 16, SpO2 100%.   Vitals  labs and radiology finding personally reviewed  Review of Systems:    Pertinent positives include:  fatigue,  Constitutional:  No weight loss, night sweats, Fevers, chills,  weight loss  HEENT:  No headaches, Difficulty swallowing,Tooth/dental problems,Sore throat,  No sneezing, itching, ear ache, nasal congestion, post nasal drip,  Cardio-vascular:  No chest pain, Orthopnea, PND, anasarca, dizziness, palpitations.no Bilateral lower extremity swelling  GI:  No heartburn, indigestion, abdominal pain, nausea, vomiting, diarrhea,  change in bowel habits, loss of appetite, melena, blood in stool, hematemesis Resp:  no shortness of breath at rest. No dyspnea on exertion, No excess mucus, no productive cough, No non-productive cough, No coughing up of blood.No change in color of mucus.No wheezing. Skin:  no rash or lesions. No jaundice GU:  no dysuria, change in color of urine, no urgency or frequency. No straining to urinate.  No flank pain.  Musculoskeletal:  No joint pain or no joint swelling. No decreased range of motion. No back pain.  Psych:  No change in mood or affect. No depression or anxiety. No memory loss.  Neuro: no localizing neurological complaints, no tingling, no weakness, no double vision, no gait abnormality, no slurred speech, no confusion  All systems reviewed and apart from HOPI all are negative _______________________________________________________________________________________________ Past Medical History:   Past Medical History:  Diagnosis Date   AR (allergic rhinitis)    Arthritis    Bilateral carotid artery stenosis    MILD --  40% BILATERAL ICA PER DOPPLER JULY 2014   Diverticulosis    Frequency of urination    GERD (gastroesophageal reflux disease)    History of colon polyps    ADENOMATOUS   History of Helicobacter pylori infection    History of shingles    History of TIA (transient ischemic attack)    Hyperlipidemia    Hypertension    Impaired memory    Interstitial cystitis    Iron deficiency    Nocturia    Osteopenia    PAF (paroxysmal atrial fibrillation) (HCC)    dx 07/ 2015   Pelvic pain in female    Urgency of urination    Vitamin D  deficiency    Wears glasses       Past Surgical History:  Procedure Laterality Date   ABDOMINAL HYSTERECTOMY     CATARACT EXTRACTION W/ INTRAOCULAR LENS  IMPLANT, BILATERAL  2009   CYSTO WITH HYDRODISTENSION  02/24/2011   Procedure: CYSTOSCOPY/HYDRODISTENSION;  Surgeon: Glendia DELENA Elizabeth, MD;  Location: [facility];  Service: Urology;  Laterality: N/A;  INSTILLATION OF marcaine  and  pyridium    CYSTO WITH HYDRODISTENSION N/A 06/20/2013   Procedure: CYSTOSCOPY/HYDRODISTENSION/INSTALLATION OF PYRIDIUM  -MARCAINE  0.5%.;  Surgeon: Glendia DELENA Elizabeth, MD;  Location: [facility];  Service: Urology;  Laterality: N/A;   CYSTO WITH HYDRODISTENSION N/A 03/22/2014   Procedure: CYSTOSCOPY/HYDRODISTENSION INSTILLATION OF MARCAINE  AND PYRDIUM ;  Surgeon: Glendia Elizabeth, MD;  Location: [facility] [place];  Service: Urology;  Laterality: N/A;   CYSTO WITH HYDRODISTENSION N/A 09/17/2014   Procedure: CYSTOSCOPY HYDRODISTENSION INSTILLATION OF MARCAINE  AND PYRIDIUM ;  Surgeon: Glendia Elizabeth, MD;  Location: [facility];  Service: Urology;  Laterality: N/A;   CYSTO/ HOD/ INSTILLATION THERAPY  10-04-2008  &  05-02-2009   CYSTO/ LEFT URETEROSCOPY W/ LEFT URETERAL DILATION  04-15-2006   TOTAL HIP ARTHROPLASTY Left 05/29/2016   Procedure: TOTAL HIP ARTHROPLASTY ANTERIOR APPROACH;  Surgeon: Addie Glendia Hacker, MD;  Location: [facility];  Service: Orthopedics;  Laterality: Left;   TRANSTHORACIC ECHOCARDIOGRAM  07-17-2013   grade I diastolic dysfuntion/  ef 60-65%/  mild AR and MR/  trivial TR   URETEROLITHOTOMY Bilateral 11-24-2004 LEFT  &  05-29-2006  RIGHT   VAULT SUSPENSION WITH GRAFT/ CYSTOCELE REPAIR  09-17-2009    Social History:  Ambulatory  wheelchair bound,  or walker    reports that she has never smoked. She has never used smokeless tobacco. She reports that she does not drink alcohol  and does not use drugs.     Family History:   History reviewed. No pertinent family history. ______________________________________________________________________________________________ Allergies: Allergies  Allergen Reactions   Ambien  [Zolpidem  Tartrate] Other (See Comments)     Hallucinations. Allergy not listed on MAR    Celexa [Citalopram] Other (See Comments)    Per patient, made  the inside and outside of skin burn. Allergy not listed on MAR    Cymbalta [Duloxetine Hcl] Other (See Comments)    Allergy not listed on MAR    Ditropan Xl [Oxybutynin Chloride] Nausea Only, Swelling and Other (See Comments)    Throat swelling, Burning eyelids. Allergy not listed on MAR    Lescol [Fluvastatin] Other (See Comments)    Myalgias. Allergy not listed on MAR    Lipitor [Atorvastatin] Other (See Comments)    Constipation, Hot flashes, Left-leaning gait. Allergy not listed on MAR    Lyrica  [Pregabalin ] Other (See Comments)    Made patient feel funny. Allergy not listed on MAR    Neurontin [Gabapentin] Other (See Comments)    Hallucinations. Allergy not listed on MAR    Pravastatin Other (See Comments)    Myalgias. Allergy not listed on MAR    Tape Other (See Comments)    Skin sensitive - tears and bruises easily. Allergy not listed on MAR    Zetia [Ezetimibe] Other (See Comments)    Myalgias. Allergy not listed on MAR    Zocor [Simvastatin] Other (See Comments)    Myalgias. Allergy not listed on MAR      Prior to Admission medications   Medication Sig Start Date End Date Taking? Authorizing Provider  Cholecalciferol  (VITAMIN D3) 1000 UNITS CAPS Take 1,000 Units by mouth daily.    Yes [provider]  divalproex (DEPAKOTE SPRINKLE) 125 MG capsule Take 125 mg by mouth 2 (two) times daily as needed (agitation). 07/15/23  Yes [provider]  donepezil  (ARICEPT ) 10 MG tablet Take 10 mg by mouth every evening. 08/29/20  Yes [provider]  hydrOXYzine  (ATARAX ) 25 MG tablet Take 1 tablet (25 mg total) by mouth at bedtime as needed. Patient taking differently: Take 25 mg by mouth every evening. 11/15/23  Yes Donnamarie Lebron PARAS, MD  LORazepam  (ATIVAN ) 1 MG tablet Take 1 mg by mouth 2 (two)  times daily.   Yes [provider]  losartan  (COZAAR ) 25 MG tablet Take 25 mg by mouth every Monday, Wednesday, and Friday.   Yes [provider]   memantine  (NAMENDA ) 5 MG tablet Take 5 mg by mouth daily.   Yes [provider]  mirabegron  ER (MYRBETRIQ ) 50 MG TB24 tablet Take 50 mg by mouth daily. 12/25/14  Yes [provider]  pantoprazole  (PROTONIX ) 40 MG tablet Take 40 mg by mouth daily.   Yes [provider]  pentosan polysulfate (ELMIRON ) 100 MG capsule Take 100 mg by mouth daily. 08/27/15  Yes [provider]  polyethylene glycol (MIRALAX  / GLYCOLAX ) 17 g packet Take 17 g by mouth daily.   Yes [provider]  QUEtiapine (SEROQUEL) 25 MG tablet Take 25 mg by mouth at bedtime. 09/07/23  Yes [provider]  Rivaroxaban  (XARELTO ) 15 MG TABS tablet Take 1 tablet (15 mg total) by mouth daily with supper. Patient taking differently: Take 15 mg by mouth every evening. 07/17/13  Yes Buriev, Algis SAILOR, MD  traMADol  (ULTRAM ) 50 MG tablet Take 50 mg by mouth 3 (three) times daily as needed for moderate pain (pain score 4-6) or severe pain (pain score 7-10).   Yes [provider]    ___________________________________________________________________________________________________ Physical Exam:    11/24/2023    6:34 PM 11/24/2023    4:43 PM 11/24/2023    2:15 PM  Vitals with BMI  Systolic 117 149 859  Diastolic 71 78 64  Pulse 55 150 77     1. General:  in No  Acute distress    Chronically ill   -appearing 2. Psychological: Alert and   Oriented to self only  3. Head/ENT Dry Mucous Membranes                          Head Non traumatic, neck supple                        Poor Dentition 4. SKIN: decreased Skin turgor,  Skin clean Dry and intact no rash    5. Heart: Regular rate and rhythm no  Murmur, no Rub or gallop 6. Lungs: no wheezes or crackles   7. Abdomen: Soft,  non-tender, Non distended  8. Lower extremities: no clubbing, cyanosis, no  edema 9. Neurologically Grossly intact, moving all 4 extremities equally   10. MSK: Normal range of motion    Chart has  been reviewed  ______________________________________________________________________________________________  Assessment/Plan  89 y.o. adult with medical history significant of Dementia, CAD atrial fibrillation on Xarelto , GERD falls hypertension recurrent UTIs recurrent falls history of TIA  Admitted for  Atrial fibrillation with RVR   Urinary retention  Acute cystitis without hematuria    Present on Admission:  Acute urinary retention  Dementia without behavioral disturbance (HCC)  Anemia  Acute encephalopathy  Dyslipidemia     Dementia without behavioral disturbance (HCC) Monitor for sun downing  Anemia Obtain anemia panel  Transfuse for Hg <7 , rapidly dropping or  if symptomatic   Acute encephalopathy   - most likely multifactorial secondary to combination of  infection   mild dehydration secondary to decreased by mouth intake,  polypharmacy   - Will rehydrate   - treat underlining infection    - Hold contributing medications   - if no improvement may need further imaging to evaluate for CNS pathology pathology such as MRI of the brain  For the past 2 weeks patient has been altered she has been more independent  prior to that 2 weeks ago  - neurological exam appears to be nonfocal but patient unable to cooperate fully   - VBG   ammonia  ordered   Dyslipidemia On repatha  Recurrent falls Pt ot eval   Acute urinary retention Sp foley catheter Will need to follow up with urology    Other plan as per orders.  DVT prophylaxis:  SCD      Code Status:    Code Status: Limited: Do not attempt resuscitation (DNR) -DNR-LIMITED -Do Not Intubate/DNI    DNR/DNI as per  family  I had personally discussed CODE STATUS with  family  ACP   none   Family Communication:   Family  at  Bedside  plan of care was discussed   with Daughter   Diet dysphagia diet   Disposition Plan:     Back to current facility when stable                             Following barriers  for discharge:                                                        Electrolytes corrected                                                       Pain controlled with PO medications                             able to transition to PO antibiotics                             Will need to be able to tolerate PO                                                       Work of breathing improves       Consult Orders  (From admission, onward)           Start     Ordered   11/24/23 1849  Consult to hospitalist  Once       Provider:  (Not yet assigned)  Question Answer Comment  Place call to: [facility]   Reason for Consult Admit      11/24/23 1848                              ***Would benefit from PT/OT eval prior to DC  Ordered                   Swallow eval - SLP ordered                   Diabetes care coordinator  Transition of care consulted                   Nutrition    consulted                  Wound care  consulted                   Palliative care    consulted                   Behavioral health  consulted                    Consults called:    NONE   Admission status:  ED Disposition     ED Disposition  Admit   Condition  --   Comment  Hospital [facility] [[id number]]  Level of Care: Progressive [102]  Admit to Progressive based on following criteria: CARDIOVASCULAR & THORACIC of moderate stability with acute coronary syndrome symptoms/low risk myocardial infarction/hypertensive urgency/arrhythmias/heart failure potentially compromising stability and stable post cardiovascular intervention patients.  May place patient in observation at [facility] or [facility] if equivalent level of care is available:: No  Diagnosis: Urinary retention [[id number]]  Admitting Physician: Buddy Loeffelholz [[id number]]  Attending Physician: Mirelle Biskup [[id number]]           Obs       Level of care   *** tele  For 12H 24H      medical floor       progressive     stepdown   tele indefinitely please discontinue once patient no longer qualifies COVID-19 Labs      Hermen Mario 11/24/2023, 11:46 PM    [facility]     after 2 AM please page floor coverage   If 7AM-7PM, please contact the day team taking care of the patient using [email or web address]

## 2023-11-24 NOTE — ED Provider Notes (Signed)
 Patient was initially seen by Dr. Dreama.  Please see her note.  Patient presented to the ED for altered mental status.  Patient's ED workup notable for urinalysis suggestive of possible UTI.  Patient also has an elevated TSH and an elevated B12 level.  Patient CT imaging does not show any signs of cerebral hemorrhage.  Patient's abdomen pelvis CT however does show findings of hydronephrosis and bladder outlet obstruction.  Foley catheter order placed.  Patient's Doppler study did not show any evidence of DVT patient has been started on IV antibiotics.  Will plan admission to hospital for further treatment  Case discussed with Dr Silvester Randol Simmonds, MD 11/24/23 1919

## 2023-11-24 NOTE — Assessment & Plan Note (Signed)
 On repatha

## 2023-11-24 NOTE — Progress Notes (Signed)
 Bilateral lower extremity venous duplex has been completed. Preliminary results can be found in CV Proc through chart review.  Results were given to Dr. Dreama.  11/24/23 3:53 PM Cathlyn Collet RVT

## 2023-11-24 NOTE — Assessment & Plan Note (Signed)
Monitor for sun downing 

## 2023-11-25 DIAGNOSIS — G9341 Metabolic encephalopathy: Secondary | ICD-10-CM | POA: Diagnosis present

## 2023-11-25 DIAGNOSIS — Z1152 Encounter for screening for COVID-19: Secondary | ICD-10-CM | POA: Diagnosis not present

## 2023-11-25 DIAGNOSIS — N1831 Chronic kidney disease, stage 3a: Secondary | ICD-10-CM | POA: Diagnosis present

## 2023-11-25 DIAGNOSIS — M79605 Pain in left leg: Secondary | ICD-10-CM | POA: Diagnosis present

## 2023-11-25 DIAGNOSIS — D631 Anemia in chronic kidney disease: Secondary | ICD-10-CM | POA: Diagnosis present

## 2023-11-25 DIAGNOSIS — R296 Repeated falls: Secondary | ICD-10-CM | POA: Diagnosis present

## 2023-11-25 DIAGNOSIS — M79604 Pain in right leg: Secondary | ICD-10-CM | POA: Diagnosis present

## 2023-11-25 DIAGNOSIS — R338 Other retention of urine: Secondary | ICD-10-CM | POA: Diagnosis not present

## 2023-11-25 DIAGNOSIS — I2510 Atherosclerotic heart disease of native coronary artery without angina pectoris: Secondary | ICD-10-CM | POA: Diagnosis present

## 2023-11-25 DIAGNOSIS — E860 Dehydration: Secondary | ICD-10-CM | POA: Diagnosis present

## 2023-11-25 DIAGNOSIS — E785 Hyperlipidemia, unspecified: Secondary | ICD-10-CM | POA: Diagnosis present

## 2023-11-25 DIAGNOSIS — N136 Pyonephrosis: Secondary | ICD-10-CM | POA: Diagnosis present

## 2023-11-25 DIAGNOSIS — Z961 Presence of intraocular lens: Secondary | ICD-10-CM | POA: Diagnosis present

## 2023-11-25 DIAGNOSIS — Z96642 Presence of left artificial hip joint: Secondary | ICD-10-CM | POA: Diagnosis present

## 2023-11-25 DIAGNOSIS — I480 Paroxysmal atrial fibrillation: Secondary | ICD-10-CM | POA: Diagnosis present

## 2023-11-25 DIAGNOSIS — R339 Retention of urine, unspecified: Secondary | ICD-10-CM | POA: Diagnosis present

## 2023-11-25 DIAGNOSIS — I252 Old myocardial infarction: Secondary | ICD-10-CM | POA: Diagnosis not present

## 2023-11-25 DIAGNOSIS — I129 Hypertensive chronic kidney disease with stage 1 through stage 4 chronic kidney disease, or unspecified chronic kidney disease: Secondary | ICD-10-CM | POA: Diagnosis present

## 2023-11-25 DIAGNOSIS — N320 Bladder-neck obstruction: Secondary | ICD-10-CM | POA: Diagnosis present

## 2023-11-25 DIAGNOSIS — M8580 Other specified disorders of bone density and structure, unspecified site: Secondary | ICD-10-CM | POA: Diagnosis present

## 2023-11-25 DIAGNOSIS — Z66 Do not resuscitate: Secondary | ICD-10-CM | POA: Diagnosis present

## 2023-11-25 DIAGNOSIS — Z7901 Long term (current) use of anticoagulants: Secondary | ICD-10-CM | POA: Diagnosis not present

## 2023-11-25 DIAGNOSIS — K219 Gastro-esophageal reflux disease without esophagitis: Secondary | ICD-10-CM | POA: Diagnosis present

## 2023-11-25 DIAGNOSIS — B952 Enterococcus as the cause of diseases classified elsewhere: Secondary | ICD-10-CM | POA: Diagnosis present

## 2023-11-25 DIAGNOSIS — F0390 Unspecified dementia without behavioral disturbance: Secondary | ICD-10-CM | POA: Diagnosis present

## 2023-11-25 DIAGNOSIS — L89156 Pressure-induced deep tissue damage of sacral region: Secondary | ICD-10-CM | POA: Diagnosis present

## 2023-11-25 LAB — COMPREHENSIVE METABOLIC PANEL WITH GFR
ALT: 15 U/L (ref 0–44)
AST: 17 U/L (ref 15–41)
Albumin: 3.2 g/dL — ABNORMAL LOW (ref 3.5–5.0)
Alkaline Phosphatase: 77 U/L (ref 38–126)
Anion gap: 10 (ref 5–15)
BUN: 19 mg/dL (ref 8–23)
CO2: 22 mmol/L (ref 22–32)
Calcium: 8.9 mg/dL (ref 8.9–10.3)
Chloride: 104 mmol/L (ref 98–111)
Creatinine, Ser: 1.02 mg/dL — ABNORMAL HIGH (ref 0.44–1.00)
GFR, Estimated: 53 mL/min — ABNORMAL LOW (ref >60)
Glucose, Bld: 153 mg/dL — ABNORMAL HIGH (ref 70–99)
Potassium: 4.3 mmol/L (ref 3.5–5.1)
Sodium: 137 mmol/L (ref 135–145)
Total Bilirubin: 0.3 mg/dL (ref 0.0–1.2)
Total Protein: 6.3 g/dL — ABNORMAL LOW (ref 6.5–8.1)

## 2023-11-25 LAB — CBC
HCT: 26.4 % — ABNORMAL LOW (ref 36.0–46.0)
Hemoglobin: 8.4 g/dL — ABNORMAL LOW (ref 12.0–15.0)
MCH: 29.1 pg (ref 26.0–34.0)
MCHC: 31.8 g/dL (ref 30.0–36.0)
MCV: 91.3 fL (ref 80.0–100.0)
Platelets: 281 K/uL (ref 150–400)
RBC: 2.89 MIL/uL — ABNORMAL LOW (ref 3.87–5.11)
RDW: 15.1 % (ref 11.5–15.5)
WBC: 6.3 K/uL (ref 4.0–10.5)
nRBC: 0 % (ref 0.0–0.2)

## 2023-11-25 LAB — BASIC METABOLIC PANEL WITH GFR
Anion gap: 11 (ref 5–15)
BUN: 20 mg/dL (ref 8–23)
CO2: 22 mmol/L (ref 22–32)
Calcium: 8.7 mg/dL — ABNORMAL LOW (ref 8.9–10.3)
Chloride: 106 mmol/L (ref 98–111)
Creatinine, Ser: 0.95 mg/dL (ref 0.44–1.00)
GFR, Estimated: 57 mL/min — ABNORMAL LOW (ref >60)
Glucose, Bld: 101 mg/dL — ABNORMAL HIGH (ref 70–99)
Potassium: 4.4 mmol/L (ref 3.5–5.1)
Sodium: 138 mmol/L (ref 135–145)

## 2023-11-25 LAB — RESP PANEL BY RT-PCR (RSV, FLU A&B, COVID)  RVPGX2
Influenza A by PCR: NEGATIVE
Influenza B by PCR: NEGATIVE
Resp Syncytial Virus by PCR: NEGATIVE
SARS Coronavirus 2 by RT PCR: NEGATIVE

## 2023-11-25 LAB — HIV ANTIBODY (ROUTINE TESTING W REFLEX): HIV Screen 4th Generation wRfx: NONREACTIVE

## 2023-11-25 LAB — VITAMIN B12: Vitamin B-12: 2250 pg/mL — ABNORMAL HIGH (ref 180–914)

## 2023-11-25 LAB — FOLATE: Folate: 19.2 ng/mL (ref >5.9)

## 2023-11-25 LAB — PHOSPHORUS: Phosphorus: 4 mg/dL (ref 2.5–4.6)

## 2023-11-25 LAB — MAGNESIUM: Magnesium: 2.1 mg/dL (ref 1.7–2.4)

## 2023-11-25 LAB — GLUCOSE, CAPILLARY: Glucose-Capillary: 100 mg/dL — ABNORMAL HIGH (ref 70–99)

## 2023-11-25 MED ORDER — DILTIAZEM HCL ER COATED BEADS 120 MG PO CP24
120.0000 mg | ORAL_CAPSULE | Freq: Every day | ORAL | Status: DC
  Administered 2023-11-25 – 2023-11-29 (×5): 120 mg via ORAL
  Filled 2023-11-25 (×5): qty 1

## 2023-11-25 MED ORDER — PENTOSAN POLYSULFATE SODIUM 100 MG PO CAPS
100.0000 mg | ORAL_CAPSULE | Freq: Every day | ORAL | Status: DC
  Administered 2023-11-25 – 2023-11-28 (×4): 100 mg via ORAL
  Filled 2023-11-25 (×6): qty 1

## 2023-11-25 MED ORDER — CHLORHEXIDINE GLUCONATE CLOTH 2 % EX PADS
6.0000 | MEDICATED_PAD | Freq: Every day | CUTANEOUS | Status: DC
  Administered 2023-11-25 – 2023-11-29 (×4): 6 via TOPICAL

## 2023-11-25 NOTE — Evaluation (Signed)
 Physical Therapy Evaluation Patient Details Name: Christine [name] MRN: [id number] DOB: 1933/10/02 Today's Date: 11/25/2023  History of Present Illness  89 y.o. adult Presented with increased confusion and lethargy and legs pain  Coming in from SNF recently was admitted for UTI  Had a fall was discharged on the 11/15/23 to SNF  Has been more sleepy over the past few days, more confused. Dx of UTI,  urinary retention. Past medical history significant of Dementia, CAD atrial fibrillation on Xarelto , GERD falls hypertension recurrent UTIs recurrent falls history of TIA  Clinical Impression  Pt admitted with above diagnosis. Max assist for supine to sit, +2 mod assist for sit to stand. Pt was able to take a few side steps to the R with RW with min A. Pt is somewhat anxious, reports pain in multiple locations but especially L hip. Pt oriented to self only. Daughter reports pt was able to transfer with a RW prior to this admission. Patient will benefit from continued inpatient follow up therapy, <3 hours/day Pt currently with functional limitations due to the deficits listed below (see PT Problem List). Pt will benefit from acute skilled PT to increase their independence and safety with mobility to allow discharge.           If plan is discharge home, recommend the following: Two people to help with walking and/or transfers;Two people to help with bathing/dressing/bathroom;Assistance with cooking/housework;Direct supervision/assist for medications management;Direct supervision/assist for financial management;Assist for transportation;Supervision due to cognitive status;Help with stairs or ramp for entrance   Can travel by private vehicle   Yes    Equipment Recommendations None recommended by PT  Recommendations for Other Services       Functional Status Assessment Patient has had a recent decline in their functional status and demonstrates the ability to make significant improvements in function in  a reasonable and predictable amount of time.     Precautions / Restrictions Precautions Precautions: Fall Recall of Precautions/Restrictions: Impaired Restrictions Weight Bearing Restrictions Per Provider Order: No      Mobility  Bed Mobility Overal bed mobility: Needs Assistance Bed Mobility: Supine to Sit, Sit to Supine     Supine to sit: Max assist Sit to supine: Max assist, +2 for physical assistance   General bed mobility comments: assist to raise trunk and pivot hips to EOB; then assist to control trunk descent and bring BLEs into bed    Transfers Overall transfer level: Needs assistance Equipment used: Rolling walker (2 wheels) Transfers: Sit to/from Stand Sit to Stand: Mod assist, +2 safety/equipment, +2 physical assistance           General transfer comment: +2 mod assist to power up with RW, pt anxious, min A for posterior lean    Ambulation/Gait   Gait Distance (Feet): 3 Feet Assistive device: Rolling walker (2 wheels) Gait Pattern/deviations: Step-to pattern Gait velocity: Decreased     General Gait Details: side step x 3 with RW with min A of 2 for balance  Stairs            Wheelchair Mobility     Tilt Bed    Modified Rankin (Stroke Patients Only)       Balance Overall balance assessment: Needs assistance Sitting-balance support: Bilateral upper extremity supported, Feet supported Sitting balance-Leahy Scale: Poor Sitting balance - Comments: Pt requires assistance to maintain sitting balance without posterior support. Postural control: Posterior lean Standing balance support: Bilateral upper extremity supported, During functional activity, Reliant on assistive device for balance  Standing balance-Leahy Scale: Poor Standing balance comment: dependent on RW and external support                             Pertinent Vitals/Pain Pain Assessment Faces Pain Scale: Hurts whole lot Breathing: occasional labored breathing,  short period of hyperventilation Negative Vocalization: occasional moan/groan, low speech, negative/disapproving quality Facial Expression: sad, frightened, frown Body Language: tense, distressed pacing, fidgeting Consolability: distracted or reassured by voice/touch PAINAD Score: 5 Pain Location: LLE Pain Descriptors / Indicators: Discomfort, Grimacing, Guarding Pain Intervention(s): Limited activity within patient's tolerance, Monitored during session, Repositioned    Home Living Family/patient expects to be discharged to:: Assisted living                   Additional Comments: At [facility] at [facility]    Prior Function Prior Level of Function : Needs assist             Mobility Comments: most recently was transferring to a chair with assist ADLs Comments: assist needed     Extremity/Trunk Assessment   Upper Extremity Assessment Upper Extremity Assessment: Defer to OT evaluation    Lower Extremity Assessment Lower Extremity Assessment: Generalized weakness;Difficult to assess due to impaired cognition RLE Deficits / Details: pt guarding 2* pain, difficulty following commands LLE Deficits / Details: pt guarding 2* pain, difficulty following commands    Cervical / Trunk Assessment Cervical / Trunk Assessment: Kyphotic Cervical / Trunk Exceptions: Forward head posture with rounded shoulders  Communication   Communication Communication: Impaired Factors Affecting Communication: Reduced clarity of speech;Difficulty expressing self    Cognition Arousal: Alert Behavior During Therapy: Anxious   PT - Cognitive impairments: History of cognitive impairments, Memory                         Following commands: Impaired Following commands impaired: Follows one step commands inconsistently     Cueing Cueing Techniques: Verbal cues, Gestural cues, Tactile cues, Visual cues     General Comments      Exercises     Assessment/Plan    PT  Assessment Patient needs continued PT services  PT Problem List Decreased strength;Decreased range of motion;Decreased activity tolerance;Decreased balance;Decreased mobility;Decreased knowledge of use of DME;Decreased safety awareness;Decreased knowledge of precautions;Pain       PT Treatment Interventions DME instruction;Gait training;Functional mobility training;Therapeutic activities;Therapeutic exercise;Balance training;Patient/family education    PT Goals (Current goals can be found in the Care Plan section)  Acute Rehab PT Goals Patient Stated Goal: Decrease pain PT Goal Formulation: With patient/family Time For Goal Achievement: 12/09/23 Potential to Achieve Goals: Fair    Frequency Min 2X/week     Co-evaluation               AM-PAC PT 6 Clicks Mobility  Outcome Measure Help needed turning from your back to your side while in a flat bed without using bedrails?: A Lot Help needed moving from lying on your back to sitting on the side of a flat bed without using bedrails?: A Lot Help needed moving to and from a bed to a chair (including a wheelchair)?: A Lot Help needed standing up from a chair using your arms (e.g., wheelchair or bedside chair)?: A Lot Help needed to walk in hospital room?: A Lot Help needed climbing 3-5 steps with a railing? : Total 6 Click Score: 11    End of Session Equipment Utilized  During Treatment: Gait belt Activity Tolerance: Patient limited by pain Patient left: with call bell/phone within reach;with family/visitor present;in bed Nurse Communication: Mobility status PT Visit Diagnosis: Pain;Difficulty in walking, not elsewhere classified (R26.2);History of falling (Z91.81);Muscle weakness (generalized) (M62.81) Pain - Right/Left: Left Pain - part of body: Hip    Time: 8748-8682 PT Time Calculation (min) (ACUTE ONLY): 26 min   Charges:   PT Evaluation $PT Eval Moderate Complexity: 1 Mod PT Treatments $Therapeutic Activity: 8-22  mins PT General Charges $$ ACUTE PT VISIT: 1 Visit         Sylvan Delon Copp PT 11/25/2023  Acute Rehabilitation Services  Office [phone]

## 2023-11-25 NOTE — ED Notes (Signed)
 Christine [name] called from pts facility, and was provided with an update.

## 2023-11-25 NOTE — Progress Notes (Signed)
 OT Cancellation Note  Patient Details Name: Christine [name] MRN: [id number] DOB: [birth year]   Cancelled Treatment:    Reason Eval/Treat Not Completed: Fatigue/lethargy limiting ability to participate. Pt received in side-lying with daughter present. Daughter requests OT eval tomorrow AM as pt is tired and has not slept much. Already been up with PT earlier. OT will attempt tomorrow morning for eval as appropriate.   Fannie Gathright L. Doran Nestle, OTR/L  11/25/23, 2:00 PM

## 2023-11-25 NOTE — ED Notes (Signed)
 Attempted to call unit, but no answer X2.

## 2023-11-25 NOTE — ED Notes (Signed)
 Physical therapy at bedside

## 2023-11-25 NOTE — Evaluation (Signed)
 SLP Cancellation Note  Patient Details Name: ANYJAH ROUNDTREE MRN: [id number] DOB: 1933-11-26   Cancelled treatment:       Reason Eval/Treat Not Completed: Other (comment) (pt asleep, daughter requesting to check at later time)  Madelin POUR, MS [facility] SLP Acute Rehab Services Office [phone]   Nicolas Emmie Caldron 11/25/2023, 2:13 PM

## 2023-11-25 NOTE — Consult Note (Signed)
 WOC Nurse Consult Note: Reason for Consult: sacral wound  Wound type: Deep Tissue Pressure Injury sacrum and B buttocks purple maroon discoloration  Pressure Injury POA: Yes  Measurement: 4 cm x 14 cm   Wound bed: skin intact, purple maroon discoloration  Drainage (amount, consistency, odor) none  Periwound: erythema  Dressing procedure/placement/frequency: Cleanse sacrum/buttocks with soap and water , dry and apply Xeroform gauze ([place] [phone]) to wound bed daily. SEcure with silicone foam.   Patient would benefit from a low air loss mattress for pressure redistribution and moisture management.    POC discussed with bedside nurse. WOC team will not follow. Please reconsult if further needs arise.  DTPI are high risk to deteriorate.   Thank you,    Powell Bar MSN, RN-BC, [employer]

## 2023-11-25 NOTE — ED Notes (Signed)
 Family at bedside.

## 2023-11-25 NOTE — Progress Notes (Signed)
 [facility] Progress Note  Patient: Christine [name]     FMW:[id number]  DOA: 11/24/2023   PCP: Shayne Anes, MD       Brief hospital course: This is an 89 year old female with dementia, coronary artery disease, atrial fibrillation, recurrent UTIs who presents to the hospital for increasing confusion and lethargy from a skilled nursing facility.  She was hospitalized from 11/2 through 11/10 after a fall and treated with 5 days of IV ceftriaxone  for a suspected UTI although, the urine culture grew multiple species. In the ED: UA revealed large WBCs and rare bacteria.   CT abdomen pelvis revealed bilateral hydronephrosis and hydroureter distended to the level of urinary bladder. A Foley catheter was placed. She was also found to have a lower lobe infiltrate on chest x-ray. She was started on ceftriaxone  and azithromycin and admitted to the hospital.  Subjective:  Nonverbal  Assessment and Plan: Principal Problem:   Acute urinary retention with bilateral hydronephrosis UTI - Continue Foley catheter and antibiotics  Active Problems: Community-acquired pneumonia - Left basilar opacity noted on chest x-ray - Continue Rocephin  and azithromycin  CKD stage III AA  Anemia of chronic disease    Recurrent falls - PT eval     Acute encephalopathy - With underlying dementia - Treat infections and follow  Abnormal thyroid  functions - TSH 8.810 - Free T41.16 - Will need to recheck when the patient is not acutely ill-follow-up for signs of hyperthyroidism      Code Status: Limited: Do not attempt resuscitation (DNR) -DNR-LIMITED -Do Not Intubate/DNI  Total time on patient care: 35 minutes DVT prophylaxis:  SCDs Start: 11/24/23 2210 Rivaroxaban  (XARELTO ) tablet 15 mg     Objective:   Vitals:   11/25/23 0237 11/25/23 0500 11/25/23 0649 11/25/23 0800  BP:  (!) 95/58  (!) 104/53  Pulse:  76  73  Resp:  (!) 22  16  Temp: 98.3 F (36.8 C)  97.7 F (36.5 C)   TempSrc:    Oral   SpO2:  100%  97%   There were no vitals filed for this visit. Exam: General exam: Appears comfortable  HEENT: oral mucosa moist Respiratory system: Clear to auscultation.  Cardiovascular system: S1 & S2 heard  Gastrointestinal system: Abdomen soft, non-tender, nondistended. Normal bowel sounds   Extremities: No cyanosis, clubbing or edema   CBC: Recent Labs  Lab 11/24/23 1421 11/25/23 0500  WBC 9.2 6.3  NEUTROABS 7.1  --   HGB 10.4* 8.4*  HCT 32.4* 26.4*  MCV 89.8 91.3  PLT 351 281   Basic Metabolic Panel: Recent Labs  Lab 11/24/23 1421 11/24/23 2122 11/24/23 2123 11/25/23 0500  NA 141 136  --  137  K 5.0 4.7  --  4.3  CL 105 103  --  104  CO2 24 23  --  22  GLUCOSE 112* 117*  --  153*  BUN 23 21  --  19  CREATININE 1.22* 1.08*  --  1.02*  CALCIUM  9.4 9.0  --  8.9  MG  --   --  2.1 2.1  PHOS  --   --  4.1 4.0     Scheduled Meds:  donepezil   10 mg Oral QPM   memantine   5 mg Oral Daily   pantoprazole   40 mg Oral Daily   pentosan polysulfate  100 mg Oral Daily   polyethylene glycol  17 g Oral Daily   QUEtiapine  25 mg Oral QHS   Rivaroxaban   15 mg  Oral QPM    Imaging and lab data personally reviewed   Author: Alejandro Adcox  11/25/2023 8:30 AM  To contact [facility]>   Check the care team in [facility] and look for the attending/consulting [facility] provider listed  Log into [email or web address] and use [place]'s universal password   Go to> [facility]  and find provider  If you still have difficulty reaching the provider, please page the [facility] (Director on Call) for the Hospitalists listed on amion

## 2023-11-26 DIAGNOSIS — R338 Other retention of urine: Secondary | ICD-10-CM | POA: Diagnosis not present

## 2023-11-26 LAB — STREP PNEUMONIAE URINARY ANTIGEN: Strep Pneumo Urinary Antigen: NEGATIVE

## 2023-11-26 LAB — VITAMIN B1: Vitamin B1 (Thiamine): 119.2 nmol/L (ref 66.5–200.0)

## 2023-11-26 LAB — T3: T3, Total: 104 ng/dL (ref 71–180)

## 2023-11-26 MED ORDER — SODIUM CHLORIDE 0.9 % IV SOLN
1.0000 g | Freq: Every day | INTRAVENOUS | Status: DC
  Administered 2023-11-26: 1 g via INTRAVENOUS
  Filled 2023-11-26: qty 10

## 2023-11-26 NOTE — TOC Initial Note (Signed)
 Transition of Care [facility]) - Initial/Assessment Note   Patient Details  Name: Christine [name] MRN: [id number] Date of Birth: 1933/09/30  [facility]) CM/SW Contact:    Duwaine GORMAN Aran, LCSW Phone Number: 11/26/2023, 3:13 PM  Clinical Narrative: CSW spoke with daughter, Christine [name], to complete assessment and discuss PT's recommendation for SNF. Per daughter, patient resides in memory care at [facility]) and she prefers for the patient to return to memory care with PT at the facility. Daughter reported patient was recently at [facility] for rehab for about a week and patient would do better returning to her living environment. CSW called [facility] and left a voicemail for [employer] with the memory care unit. FL2 started. Care management to follow.  Expected Discharge Plan: Memory Care Barriers to Discharge: Continued Medical Work up  Patient Goals and CMS Choice Patient states their goals for this hospitalization and ongoing recovery are:: Return to [facility] memory care Choice offered to / list presented to : NA  Expected Discharge Plan and Services In-house Referral: Clinical Social Work Post Acute Care Choice: NA Living arrangements for the past 2 months: Assisted Living Facility          DME Arranged: N/A DME Agency: NA  Prior Living Arrangements/Services Living arrangements for the past 2 months: Assisted Living Facility Lives with:: Facility Resident Patient language and need for interpreter reviewed:: Yes Do you feel safe going back to the place where you live?: Yes      Need for Family Participation in Patient Care: Yes (Comment) (Patient is oriented to self only.) Care giver support system in place?: Yes (comment) Criminal Activity/Legal Involvement Pertinent to Current Situation/Hospitalization: No - Comment as needed  Activities of Daily Living ADL Screening (condition at time of admission) Independently performs  ADLs?: No Does the patient have a NEW difficulty with bathing/dressing/toileting/self-feeding that is expected to last >3 days?: Yes (Initiates electronic notice to provider for possible OT consult) Does the patient have a NEW difficulty with getting in/out of bed, walking, or climbing stairs that is expected to last >3 days?: Yes (Initiates electronic notice to provider for possible PT consult) Does the patient have a NEW difficulty with communication that is expected to last >3 days?: No Is the patient deaf or have difficulty hearing?: Yes Does the patient have difficulty seeing, even when wearing glasses/contacts?: No Does the patient have difficulty concentrating, remembering, or making decisions?: No  Permission Sought/Granted Permission granted to share info w AGENCY: [facility]  Emotional Assessment Attitude/Demeanor/Rapport: Unable to Assess Affect (typically observed): Unable to Assess Orientation: : Oriented to Self Alcohol  / Substance Use: Not Applicable Psych Involvement: No (comment)  Admission diagnosis:  Urinary retention [R33.9] Somnolence [R40.0] Acute cystitis without hematuria [N30.00] Atrial fibrillation with RVR (HCC) [I48.91] Pain in both lower extremities [M79.604, M79.605] Patient Active Problem List   Diagnosis Date Noted   Urinary retention 11/25/2023   Acute urinary retention 11/24/2023   CAP (community acquired pneumonia) 11/24/2023   Protein-calorie malnutrition, severe 11/08/2023   Syncope 01/30/2022   Syncope and collapse 01/30/2022   Malnutrition of moderate degree 03/15/2021   Vitamin B12 deficiency 03/14/2021   Dyslipidemia 03/14/2021   Recurrent falls 03/14/2021   NSTEMI (non-ST elevated myocardial infarction) (HCC) 03/13/2021   History of kidney stones 10/23/2020   Dementia without behavioral disturbance (HCC) 08/30/2020   Pain in right knee 08/30/2020   Vitamin D  deficiency 08/30/2020   Bilateral impacted cerumen 08/22/2019    Thrombophilia  03/02/2019   Increased frequency of urination 10/14/2017   Impacted cerumen 08/04/2016   Age-related osteoporosis without current pathological fracture 07/06/2016   Hip fracture (HCC) 05/29/2016   Closed left hip fracture (HCC) 05/27/2016   Insomnia 12/17/2015   Memory loss 12/17/2015   Dysuria 12/04/2015   AKI (acute kidney injury) 10/01/2015   Acute encephalopathy 10/01/2015   Interstitial cystitis 10/01/2015   Essential hypertension 10/01/2015   Rhabdomyolysis 09/30/2015   Major depression, single episode 07/10/2015   Non-thrombocytopenic purpura 07/10/2015   Encounter for general adult medical examination without abnormal findings 11/30/2014   Fall 08/07/2013   Pain in left leg 08/07/2013   Lower urinary tract infectious disease 07/16/2013   Paroxysmal atrial fibrillation (HCC) 07/16/2013   Chest pain 07/16/2013   Transient ischemic attack 07/04/2012   Chronic pain syndrome 03/28/2012   Residual hemorrhoidal skin tags 01/18/2012   Skin sensation disturbance 03/03/2010   Abdominal pain 02/03/2010   Pain in limb 10/07/2009   Anemia 03/26/2009   Allergic rhinitis 02/27/2009   Anxiety disorder 02/27/2009   Hyperlipidemia 02/27/2009   Low compliance bladder 02/27/2009   DYSPEPSIA 01/18/2008   ADENOMATOUS COLONIC POLYP 01/17/2008   HEMORRHOIDS, INTERNAL 01/17/2008   ESOPHAGITIS 01/17/2008   DIVERTICULOSIS, COLON 01/17/2008   HEMORRHOIDS-EXTERNAL 12/07/2007   GERD 12/07/2007   Constipation 12/07/2007   RECTAL BLEEDING 12/07/2007   Loss of weight 12/07/2007   Nausea alone 12/07/2007   HEARTBURN 12/07/2007   ABDOMINAL BLOATING 12/07/2007   History of colonic polyps 12/07/2007   PCP:  Shayne Anes, MD Pharmacy:  No Pharmacies Listed  Social Drivers of Health (SDOH) Social History: SDOH Screenings   Food Insecurity: No Food Insecurity (11/25/2023)  Housing: Low Risk  (11/25/2023)  Transportation Needs: No Transportation Needs (11/25/2023)  Utilities:  Not At Risk (11/25/2023)  Social Connections: Patient Declined (11/25/2023)  Tobacco Use: Low Risk  (11/24/2023)   SDOH Interventions:    Readmission Risk Interventions    11/26/2023    2:47 PM  Readmission Risk Prevention Plan  Transportation Screening Complete  HRI or Home Care Consult Complete  Social Work Consult for Recovery Care Planning/Counseling Complete  Palliative Care Screening Not Applicable  Medication Review [employer]) Complete

## 2023-11-26 NOTE — Evaluation (Signed)
 Occupational Therapy Evaluation Patient Details Name: Christine [name] MRN: [id number] DOB: February 02, 1933 Today's Date: 11/26/2023   History of Present Illness   Pt is a 89 y.o. female admitted with acute urinary retention with bilateral hydronephrosis, community-acquired pneumonia & acute encephalopathy. PMH significant for dementia, CAD, AFIB, TIA, falls, recurrent UTIs. Recent hospitalization from 11/2-11/20 following a fall, discharged to SNF.     Clinical Impressions Pt admitted with above. Prior to admission, pt recently at [facility] following a fall. Lives at [facility] in memory care, anticipate pt required assist at baseline. Pt poor historian, lethargic, anxious, and is only alert to self. Pt requires total A +2 for bed mobility and MAX A for all ADL tasks. Anticipate MOD-MAX A +2 for functional transfers, not assessed this session due to lethargy. Pt functionally limited by poor activity tolerance due to pain, generalized weakness, impairments in balance & cognition. Pt would benefit from skilled OT services to address noted impairments and functional limitations (see below for any additional details) in order to maximize safety and independence while minimizing falls risk and caregiver burden. Anticipate the need for follow up OT services upon acute hospital DC. Patient will benefit from continued inpatient follow up therapy, <3 hours/day.      If plan is discharge home, recommend the following:   Two people to help with walking and/or transfers;A lot of help with bathing/dressing/bathroom;Assistance with cooking/housework;Assistance with feeding;Direct supervision/assist for medications management;Direct supervision/assist for financial management;Assist for transportation;Supervision due to cognitive status     Functional Status Assessment   Patient has had a recent decline in their functional status and demonstrates the ability to make significant improvements in function in a  reasonable and predictable amount of time.     Equipment Recommendations   Other (comment)      Precautions/Restrictions   Precautions Precautions: Fall Recall of Precautions/Restrictions: Impaired Restrictions Weight Bearing Restrictions Per Provider Order: No     Mobility Bed Mobility Overal bed mobility: Needs Assistance Bed Mobility: Supine to Sit, Sit to Supine     Supine to sit: Total assist, +2 for physical assistance Sit to supine: Total assist, +2 for physical assistance   General bed mobility comments: assist to raise trunk, pivot BLE, use of bed pad. pt resists, does not participate. pt moans with limited movement (her baseline per daugther)    Transfers Overall transfer level: Needs assistance                 General transfer comment: NT, anticipate +2 mod-max      Balance Overall balance assessment: Needs assistance Sitting-balance support: Bilateral upper extremity supported, Feet supported Sitting balance-Leahy Scale: Poor Sitting balance - Comments: Pt requires assistance to maintain sitting balance without posterior support.       Standing balance comment: NT due to cognition, lethargy and lack of participation                           ADL either performed or assessed with clinical judgement   ADL Overall ADL's : Needs assistance/impaired Eating/Feeding: Minimal assistance   Grooming: Sitting;Total assistance Grooming Details (indicate cue type and reason): while EOB, pt provided with washcloth. pt grimaces its wet and refuses to participate. Upper Body Bathing: Maximal assistance   Lower Body Bathing: Maximal assistance   Upper Body Dressing : Maximal assistance   Lower Body Dressing: Maximal assistance     Toilet Transfer Details (indicate cue type and reason): NT, anticipate bed  level Toileting- Clothing Manipulation and Hygiene: Total assistance       Functional mobility during ADLs: Total assistance;+2  for physical assistance General ADL Comments: pt limited by anxiety, pain, cognition. does not actively participate. anticipate max-totalA for bed level ADLs.     Vision Baseline Vision/History: 6 Macular Degeneration              Pertinent Vitals/Pain Pain Assessment Pain Assessment: PAINAD Breathing: normal Negative Vocalization: occasional moan/groan, low speech, negative/disapproving quality Facial Expression: sad, frightened, frown Body Language: tense, distressed pacing, fidgeting Consolability: distracted or reassured by voice/touch PAINAD Score: 4 Pain Location: generalized Pain Descriptors / Indicators: Discomfort, Grimacing, Guarding Pain Intervention(s): Limited activity within patient's tolerance, Monitored during session, Repositioned, Premedicated before session     Extremity/Trunk Assessment Upper Extremity Assessment Upper Extremity Assessment: Generalized weakness;Difficult to assess due to impaired cognition   Lower Extremity Assessment Lower Extremity Assessment: Defer to PT evaluation;Generalized weakness RLE Deficits / Details: pt guarding 2* pain, difficulty following commands RLE: Unable to fully assess due to pain LLE Deficits / Details: pt guarding 2* pain, difficulty following commands   Cervical / Trunk Assessment Cervical / Trunk Assessment: Kyphotic Cervical / Trunk Exceptions: Forward head posture with rounded shoulders   Communication Communication Communication: Impaired Factors Affecting Communication: Reduced clarity of speech;Difficulty expressing self   Cognition Arousal: Lethargic Behavior During Therapy: Anxious, Flat affect Cognition: History of cognitive impairments             OT - Cognition Comments: baseline dementia                 Following commands: Impaired Following commands impaired: Follows one step commands inconsistently     Cueing  General Comments   Cueing Techniques: Verbal cues;Gestural  cues;Tactile cues;Visual cues  BLE edema, LIV site with drainage, heels floated in bed and socks removed due to swelling           Home Living Family/patient expects to be discharged to:: Skilled nursing facility                                 Additional Comments: At Memory Care at [facility]      Prior Functioning/Environment Prior Level of Function : Needs assist;Patient poor historian/Family not available             Mobility Comments: most recently was transferring to a chair with assist ADLs Comments: assist needed    OT Problem List: Decreased strength;Decreased activity tolerance;Decreased range of motion;Impaired balance (sitting and/or standing);Decreased cognition;Decreased safety awareness;Decreased knowledge of use of DME or AE;Pain   OT Treatment/Interventions: Self-care/ADL training;Therapeutic exercise;Energy conservation;DME and/or AE instruction;Therapeutic activities;Cognitive remediation/compensation;Patient/family education;Balance training      OT Goals(Current goals can be found in the care plan section)   Acute Rehab OT Goals OT Goal Formulation: Patient unable to participate in goal setting Time For Goal Achievement: 12/10/23 Potential to Achieve Goals: Fair ADL Goals Pt Will Perform Grooming: sitting;with min assist Pt Will Perform Upper Body Dressing: with contact guard assist;sitting Pt Will Transfer to Toilet: with min assist;stand pivot transfer;bedside commode Pt Will Perform Toileting - Clothing Manipulation and hygiene: with max assist;sit to/from stand;sitting/lateral leans   OT Frequency:  Min 1X/week    Co-evaluation              AM-PAC OT 6 Clicks Daily Activity     Outcome Measure Help from another person eating meals?: A  Little Help from another person taking care of personal grooming?: A Lot Help from another person toileting, which includes using toliet, bedpan, or urinal?: A Lot Help from another  person bathing (including washing, rinsing, drying)?: A Lot Help from another person to put on and taking off regular upper body clothing?: A Lot Help from another person to put on and taking off regular lower body clothing?: A Lot 6 Click Score: 13   End of Session Nurse Communication: Mobility status  Activity Tolerance: Patient limited by lethargy Patient left: in bed;with call bell/phone within reach;with bed alarm set  OT Visit Diagnosis: Muscle weakness (generalized) (M62.81);History of falling (Z91.81);Other symptoms and signs involving cognitive function;Pain Pain - Right/Left: Left Pain - part of body: Leg                Time: 8887-8865 OT Time Calculation (min): 22 min Charges:  OT General Charges $OT Visit: 1 Visit OT Evaluation $OT Eval Moderate Complexity: 1 Mod  Deshanti Adcox L. Kenyatte Chatmon, OTR/L  11/26/23, 2:02 PM

## 2023-11-26 NOTE — Evaluation (Signed)
 Clinical/Bedside Swallow Evaluation Patient Details  Name: Christine [name] MRN: [id number] Date of Birth: 11-27-1933  Today's Date: 11/26/2023 Time: SLP Start Time (ACUTE ONLY): 0810 SLP Stop Time (ACUTE ONLY): 0839 SLP Time Calculation (min) (ACUTE ONLY): 29 min  Past Medical History:  Past Medical History:  Diagnosis Date   AR (allergic rhinitis)    Arthritis    Bilateral carotid artery stenosis    MILD --  40% BILATERAL ICA PER DOPPLER JULY 2014   Diverticulosis    Frequency of urination    GERD (gastroesophageal reflux disease)    History of colon polyps    ADENOMATOUS   History of Helicobacter pylori infection    History of shingles    History of TIA (transient ischemic attack)    Hyperlipidemia    Hypertension    Impaired memory    Interstitial cystitis    Iron deficiency    Nocturia    Osteopenia    PAF (paroxysmal atrial fibrillation) (HCC)    dx 07/ 2015   Pelvic pain in female    Urgency of urination    Vitamin D  deficiency    Wears glasses    Past Surgical History:  Past Surgical History:  Procedure Laterality Date   ABDOMINAL HYSTERECTOMY     CATARACT EXTRACTION W/ INTRAOCULAR LENS  IMPLANT, BILATERAL  2009   CYSTO WITH HYDRODISTENSION  02/24/2011   Procedure: CYSTOSCOPY/HYDRODISTENSION;  Surgeon: Glendia DELENA Elizabeth, MD;  Location: [facility] [place];  Service: Urology;  Laterality: N/A;  INSTILLATION OF marcaine  and  pyridium    CYSTO WITH HYDRODISTENSION N/A 06/20/2013   Procedure: CYSTOSCOPY/HYDRODISTENSION/INSTALLATION OF PYRIDIUM  -MARCAINE  0.5%.;  Surgeon: Glendia DELENA Elizabeth, MD;  Location: [facility];  Service: Urology;  Laterality: N/A;   CYSTO WITH HYDRODISTENSION N/A 03/22/2014   Procedure: CYSTOSCOPY/HYDRODISTENSION INSTILLATION OF MARCAINE  AND PYRDIUM ;  Surgeon: Glendia Elizabeth, MD;  Location: [facility] [place];  Service: Urology;  Laterality: N/A;   CYSTO WITH HYDRODISTENSION N/A 09/17/2014   Procedure: CYSTOSCOPY  HYDRODISTENSION INSTILLATION OF MARCAINE  AND PYRIDIUM ;  Surgeon: Glendia Elizabeth, MD;  Location: [facility] [place];  Service: Urology;  Laterality: N/A;   CYSTO/ HOD/ INSTILLATION THERAPY  10-04-2008  &  05-02-2009   CYSTO/ LEFT URETEROSCOPY W/ LEFT URETERAL DILATION  04-15-2006   TOTAL HIP ARTHROPLASTY Left 05/29/2016   Procedure: TOTAL HIP ARTHROPLASTY ANTERIOR APPROACH;  Surgeon: Addie Glendia Hacker, MD;  Location: [facility];  Service: Orthopedics;  Laterality: Left;   TRANSTHORACIC ECHOCARDIOGRAM  07-17-2013   grade I diastolic dysfuntion/  ef 60-65%/  mild AR and MR/  trivial TR   URETEROLITHOTOMY Bilateral 11-24-2004 LEFT  &  05-29-2006  RIGHT   VAULT SUSPENSION WITH GRAFT/ CYSTOCELE REPAIR  09-17-2009   HPI:  89 yo female adm to [facility] with near syncope, found to have urinary retention and had catheter placed. Pt with PMH + for dementia, recurrent hospital admissions, GERD, falls, TIA, Afib on Xarelto .  Daughter reports pt with soome increased coughing recently with liquids but tolerating foods. She has been less mobile recently and needed assist to stand pivot to chair.  She is a DNR.  Daughter mentions concern for potential aspiration and states MD advised to concern for possible PNA.  Pt's intake varies but she is normally able to feed herself and takes her large pills crushed with applesauce.    Assessment / Plan / Recommendation  Clinical Impression  With her breakfast meal, pt tolerated intake well without s/s of aspiration. She does demonstrate  mildly prolonged oral manipulation of solids but with full oral clearance.  No focal CN deficits noted but pt appears to have presbyphonia - Daughter reports pt has been coughing more with liquids recently.  SLP thus trialed slightly thick - between thin/nectar juice with pt consuming without complaint, nor s/s of aspiration.  Discussed compensation strategies for pt's with dementia and dysphagia -and risks/benefits of modifications of diet.   Provided sample packet of thickener for pt's daughter - and she expressed she may look into it in the future if helps pt with comfort. Discussed importance of hydration and pH neutral status of water  making it safest to aspirate.  Addressed that feeding tubes are contraindicated SLP Visit Diagnosis: Dysphagia, unspecified (R13.10)    Aspiration Risk  Mild aspiration risk    Diet Recommendation Dysphagia 3 (Mech soft);Thin liquid    Liquid Administration via: Cup;Straw Medication Administration: Whole meds with liquid Supervision: Intermittent supervision to cue for compensatory strategies Compensations: Slow rate;Small sips/bites Postural Changes: Seated upright at 90 degrees;Remain upright for at least 30 minutes after po intake    Other  Recommendations Oral Care Recommendations: Oral care BID     Assistance Recommended at Discharge  N/a  Functional Status Assessment Patient has had a recent decline in their functional status and demonstrates the ability to make significant improvements in function in a reasonable and predictable amount of time.  Frequency and Duration     N/a       Prognosis   N/a     Swallow Study   General Date of Onset: 11/26/23 HPI: 89 yo female adm to [facility] with near syncope, found to have urinary retention and had catheter placed. Pt with PMH + for dementia, recurrent hospital admissions, GERD, falls, TIA, Afib on Xarelto .  Daughter reports pt with soome increased coughing recently with liquids but tolerating foods. She has been less mobile recently and needed assist to stand pivot to chair.  She is a DNR.  Daughter mentions concern for potential aspiration and states MD advised to concern for possible PNA.  Pt's intake varies but she is normally able to feed herself and takes her large pills crushed with applesauce. Type of Study: Bedside Swallow Evaluation Diet Prior to this Study: Regular Temperature Spikes Noted: No Respiratory Status: Room air History  of Recent Intubation: No Behavior/Cognition: Alert;Cooperative;Pleasant mood Oral Cavity Assessment: Within Functional Limits Oral Care Completed by SLP: Yes Oral Cavity - Dentition: Adequate natural dentition Vision: Functional for self-feeding Self-Feeding Abilities: Able to feed self;Needs assist Patient Positioning: Upright in bed Baseline Vocal Quality: Low vocal intensity Volitional Cough: Weak Volitional Swallow: Unable to elicit    Oral/Motor/Sensory Function Overall Oral Motor/Sensory Function: Generalized oral weakness   Ice Chips Ice chips: Not tested   Thin Liquid Thin Liquid: Within functional limits Presentation: Cup;Straw    Nectar Thick Nectar Thick Liquid: Within functional limits Presentation: Cup;Self Fed   Honey Thick Honey Thick Liquid: Not tested   Puree Puree: Within functional limits Presentation: Self Fed;Spoon   Solid     Solid: Within functional limits Presentation: Self Fed;Spoon      Nicolas Emmie Caldron 11/26/2023,9:15 AM  Madelin POUR, MS [facility] SLP Acute Rehab Services Office [phone]

## 2023-11-26 NOTE — Progress Notes (Signed)
 [facility] Progress Note  Patient: Christine [name]     FMW:[id number]  DOA: 11/24/2023   PCP: Shayne Anes, MD       Brief hospital course: This is an 89 year old female with dementia, coronary artery disease, atrial fibrillation, recurrent UTIs who presents to the hospital for increasing confusion and lethargy from a skilled nursing facility.  She was hospitalized from 11/2 through 11/10 after a fall and treated with 5 days of IV ceftriaxone  for a suspected UTI although, the urine culture grew multiple species. In the ED: UA revealed large WBCs and rare bacteria.   CT abdomen pelvis revealed bilateral hydronephrosis and hydroureter distended to the level of urinary bladder. A Foley catheter was placed. She was also found to have a lower lobe infiltrate on chest x-ray. She was started on ceftriaxone  and azithromycin  and admitted to the hospital.  Subjective:  Confused.  No complaints.  Daughter at bedside.  Assessment and Plan: Principal Problem:   Acute urinary retention with bilateral hydronephrosis UTI - Will DC Foley catheter today and follow bladder scans to look for further retention - Continue ceftriaxone  - Urine culture negative so far however, since she had urinary retention, there is a high likelihood that she had a UTI and therefore I will continue to treat this  Active Problems: Community-acquired pneumonia?  - Left basilar opacity noted on chest x-ray - Have spoken with daughter today who does not recall that the patient had cough or shortness of breath prior to coming in-DC azithromycin  as she likely did not have pneumonia  CKD stage III AA  Anemia of chronic disease    Recurrent falls - PT eval     Acute encephalopathy - With underlying dementia - Per daughter, mental status is not back to her baseline today  Abnormal thyroid  functions - TSH 8.810 - Free T41.16 - Will need to recheck when the patient is not acutely ill-follow-up for signs of  hyperthyroidism      Code Status: Limited: Do not attempt resuscitation (DNR) -DNR-LIMITED -Do Not Intubate/DNI  Total time on patient care: 35 minutes DVT prophylaxis:  SCDs Start: 11/24/23 2210 Rivaroxaban  (XARELTO ) tablet 15 mg     Objective:   Vitals:   11/25/23 2145 11/26/23 0239 11/26/23 0601 11/26/23 1215  BP: (!) 101/51 (!) 119/58 125/62 (!) 106/51  Pulse: 79 70 80 79  Resp: 20 18 20 15   Temp: 97.9 F (36.6 C) 98.7 F (37.1 C) 97.8 F (36.6 C) 98.8 F (37.1 C)  TempSrc: Oral  Oral Oral  SpO2: 95% 100% 97% 97%  Weight:      Height:       Filed Weights   11/25/23 1508  Weight: 58.1 kg   Exam: General exam: Appears comfortable  HEENT: oral mucosa moist Respiratory system: Clear to auscultation.  Cardiovascular system: S1 & S2 heard  Gastrointestinal system: Abdomen soft, non-tender, nondistended. Normal bowel sounds   Extremities: No cyanosis, clubbing or edema   CBC: Recent Labs  Lab 11/24/23 1421 11/25/23 0500  WBC 9.2 6.3  NEUTROABS 7.1  --   HGB 10.4* 8.4*  HCT 32.4* 26.4*  MCV 89.8 91.3  PLT 351 281   Basic Metabolic Panel: Recent Labs  Lab 11/24/23 1421 11/24/23 2122 11/24/23 2123 11/25/23 0500 11/25/23 1550  NA 141 136  --  137 138  K 5.0 4.7  --  4.3 4.4  CL 105 103  --  104 106  CO2 24 23  --  22 22  GLUCOSE  112* 117*  --  153* 101*  BUN 23 21  --  19 20  CREATININE 1.22* 1.08*  --  1.02* 0.95  CALCIUM  9.4 9.0  --  8.9 8.7*  MG  --   --  2.1 2.1  --   PHOS  --   --  4.1 4.0  --      Scheduled Meds:  Chlorhexidine  Gluconate Cloth  6 each Topical Daily   diltiazem   120 mg Oral Daily   donepezil   10 mg Oral QPM   memantine   5 mg Oral Daily   pantoprazole   40 mg Oral Daily   pentosan polysulfate  100 mg Oral Daily   polyethylene glycol  17 g Oral Daily   QUEtiapine   25 mg Oral QHS   Rivaroxaban   15 mg Oral QPM    Imaging and lab data personally reviewed   Author: Kenzie Thoreson  11/26/2023 1:42 PM  To contact [facility]>   Check the care team in [facility] and look for the attending/consulting TRH provider listed  Log into [email or web address] and use [place]'s universal password   Go to> [facility]  and find provider  If you still have difficulty reaching the provider, please page the [facility] (Director on Call) for the Hospitalists listed on amion

## 2023-11-27 DIAGNOSIS — R338 Other retention of urine: Secondary | ICD-10-CM | POA: Diagnosis not present

## 2023-11-27 LAB — URINE CULTURE: Culture: >=100000 — AB

## 2023-11-27 MED ORDER — AMOXICILLIN 500 MG PO CAPS
500.0000 mg | ORAL_CAPSULE | Freq: Three times a day (TID) | ORAL | Status: DC
  Administered 2023-11-27 – 2023-11-29 (×7): 500 mg via ORAL
  Filled 2023-11-27 (×7): qty 1

## 2023-11-27 NOTE — Progress Notes (Signed)
 [facility] Progress Note  Patient: Christine [name]     FMW:[id number]  DOA: 11/24/2023   PCP: Shayne Anes, MD       Brief hospital course: This is an 89 year old female with dementia, coronary artery disease, atrial fibrillation, recurrent UTIs who presents to the hospital for increasing confusion and lethargy from a skilled nursing facility.  She was hospitalized from 11/2 through 11/10 after a fall and treated with 5 days of IV ceftriaxone  for a suspected UTI although, the urine culture grew multiple species. In the ED: UA revealed large WBCs and rare bacteria.   CT abdomen pelvis revealed bilateral hydronephrosis and hydroureter distended to the level of urinary bladder. A Foley catheter was placed. She was also found to have a lower lobe infiltrate on chest x-ray. She was started on ceftriaxone  and azithromycin  and admitted to the hospital.  Subjective:  Sitting up in a chair.  Remains confused.  Daughter at bedside.  Assessment and Plan: Principal Problem:   Acute urinary retention with bilateral hydronephrosis UTI - Continue ceftriaxone  - Urine culture reveals greater than 100,000 Enterococcus faecalis - She has been on ceftriaxone -will change to Amoxil  today - She has still not voided despite the Foley catheter being removed yesterday-Will need to continue to follow for urinary retention and if she is retaining, will need to place Foley catheter prior to allowing her to leave the hospital  Active Problems: Community-acquired pneumonia?  - Left basilar opacity noted on chest x-ray - Have spoken with daughte who does not recall that the patient had cough or shortness of breath prior to coming in-DC azithromycin  on 11/21 as she likely did not have pneumonia  CKD stage III AA  Anemia of chronic disease    Recurrent falls - Will return to SNF     Acute encephalopathy - With underlying dementia - Per daughter, mental status is not back to her baseline  today  Abnormal thyroid  functions - TSH 8.810 - Free T41.16 - Will need to recheck when the patient is not acutely ill-follow-up for signs of hyperthyroidism      Code Status: Limited: Do not attempt resuscitation (DNR) -DNR-LIMITED -Do Not Intubate/DNI  Total time on patient care: 35 minutes DVT prophylaxis:  SCDs Start: 11/24/23 2210 Rivaroxaban  (XARELTO ) tablet 15 mg     Objective:   Vitals:   11/26/23 1215 11/26/23 2033 11/27/23 0444 11/27/23 1310  BP: (!) 106/51 114/77 111/64 113/64  Pulse: 79 86 85 73  Resp: 15 20 18 18   Temp: 98.8 F (37.1 C) 98.7 F (37.1 C) 97.9 F (36.6 C) 98 F (36.7 C)  TempSrc: Oral Oral Oral Axillary  SpO2: 97% 98% 98% 97%  Weight:      Height:       Filed Weights   11/25/23 1508  Weight: 58.1 kg   Exam: General exam: Appears comfortable-very confused HEENT: oral mucosa moist Respiratory system: Clear to auscultation.  Cardiovascular system: S1 & S2 heard  Gastrointestinal system: Abdomen soft, non-tender, nondistended. Normal bowel sounds   Extremities: No cyanosis, clubbing or edema   CBC: Recent Labs  Lab 11/24/23 1421 11/25/23 0500  WBC 9.2 6.3  NEUTROABS 7.1  --   HGB 10.4* 8.4*  HCT 32.4* 26.4*  MCV 89.8 91.3  PLT 351 281   Basic Metabolic Panel: Recent Labs  Lab 11/24/23 1421 11/24/23 2122 11/24/23 2123 11/25/23 0500 11/25/23 1550  NA 141 136  --  137 138  K 5.0 4.7  --  4.3 4.4  CL 105 103  --  104 106  CO2 24 23  --  22 22  GLUCOSE 112* 117*  --  153* 101*  BUN 23 21  --  19 20  CREATININE 1.22* 1.08*  --  1.02* 0.95  CALCIUM  9.4 9.0  --  8.9 8.7*  MG  --   --  2.1 2.1  --   PHOS  --   --  4.1 4.0  --      Scheduled Meds:  amoxicillin   500 mg Oral TID   Chlorhexidine  Gluconate Cloth  6 each Topical Daily   diltiazem   120 mg Oral Daily   donepezil   10 mg Oral QPM   memantine   5 mg Oral Daily   pantoprazole   40 mg Oral Daily   pentosan polysulfate  100 mg Oral Daily   polyethylene glycol  17 g  Oral Daily   QUEtiapine   25 mg Oral QHS   Rivaroxaban   15 mg Oral QPM    Imaging and lab data personally reviewed   Author: Abhishek Levesque  11/27/2023 2:39 PM  To contact [facility]>   Check the care team in [facility] and look for the attending/consulting TRH provider listed  Log into [email or web address] and use [place]'s universal password   Go to> [facility]  and find provider  If you still have difficulty reaching the provider, please page the [facility] (Director on Call) for the Hospitalists listed on amion

## 2023-11-28 ENCOUNTER — Encounter (HOSPITAL_COMMUNITY): Payer: Self-pay | Admitting: Internal Medicine

## 2023-11-28 ENCOUNTER — Other Ambulatory Visit (HOSPITAL_COMMUNITY): Payer: Self-pay

## 2023-11-28 DIAGNOSIS — R338 Other retention of urine: Secondary | ICD-10-CM | POA: Diagnosis not present

## 2023-11-28 LAB — LEGIONELLA PNEUMOPHILA SEROGP 1 UR AG: L. pneumophila Serogp 1 Ur Ag: NEGATIVE

## 2023-11-28 MED ORDER — LORAZEPAM 0.5 MG PO TABS
0.5000 mg | ORAL_TABLET | Freq: Two times a day (BID) | ORAL | 0 refills | Status: DC | PRN
  Filled 2023-11-28: qty 10, 30d supply, fill #0

## 2023-11-28 MED ORDER — DILTIAZEM HCL ER COATED BEADS 120 MG PO CP24
120.0000 mg | ORAL_CAPSULE | Freq: Every day | ORAL | 0 refills | Status: AC
  Filled 2023-11-28: qty 30, 30d supply, fill #0

## 2023-11-28 MED ORDER — AMOXICILLIN 500 MG PO CAPS
500.0000 mg | ORAL_CAPSULE | Freq: Three times a day (TID) | ORAL | 0 refills | Status: AC
  Filled 2023-11-28: qty 18, 6d supply, fill #0

## 2023-11-28 MED ORDER — ACETAMINOPHEN 325 MG PO TABS: 650.0000 mg | ORAL_TABLET | Freq: Four times a day (QID) | ORAL | Status: AC | PRN

## 2023-11-28 NOTE — TOC Progression Note (Signed)
 Transition of Care [facility]) - Progression Note    Patient Details  Name: Christine [name] MRN: [id number] Date of Birth: 02-02-1933  [facility] CM/SW Contact  Sonda Manuella Quill, RN Phone Number: 11/28/2023, 1:05 PM  Clinical Narrative:    Notified pt ready for d/c; pt from [facility]); spoke w/ Sari Burnet at Admissions at facility; she said pt cannot admit today as she must complete assessment first; she plans on arriving around 9 am tomorrow; Dr Earley notified via secure chat; IP CM following.   Expected Discharge Plan: Memory Care Barriers to Discharge: Continued Medical Work up               Expected Discharge Plan and Services In-house Referral: Clinical Social Work   Post Acute Care Choice: NA Living arrangements for the past 2 months: Assisted Living Facility Expected Discharge Date: 11/28/23               DME Arranged: N/A DME Agency: NA                   Social Drivers of Health (SDOH) Interventions SDOH Screenings   Food Insecurity: No Food Insecurity (11/25/2023)  Housing: Low Risk  (11/25/2023)  Transportation Needs: No Transportation Needs (11/25/2023)  Utilities: Not At Risk (11/25/2023)  Social Connections: Patient Declined (11/25/2023)  Tobacco Use: Low Risk  (11/24/2023)    Readmission Risk Interventions    11/26/2023    2:47 PM  Readmission Risk Prevention Plan  Transportation Screening Complete  HRI or Home Care Consult Complete  Social Work Consult for Recovery Care Planning/Counseling Complete  Palliative Care Screening Not Applicable  Medication Review Oceanographer) Complete

## 2023-11-28 NOTE — Progress Notes (Signed)
 Medications were delivered from the [facility] outpatient pharmacy. Daughter in room and agreed to take medications home.

## 2023-11-28 NOTE — Discharge Summary (Signed)
 Physician Discharge Summary  Christine [name] FMW:[id number] DOB: [birth year] DOA: 11/24/2023  PCP: Shayne Anes, MD  Admit date: 11/24/2023 Discharge date: 11/29/2023 Discharging to: ALF Recommendations for Outpatient Follow-up:  Will need close outpatient follow-up with urology as she is discharging with a Foley catheter Please recheck Thyroid  function studies     Discharge Diagnoses:   Principal Problem:   Acute urinary retention Active Problems:   Recurrent falls   Paroxysmal atrial fibrillation (HCC)   Dyslipidemia   Acute encephalopathy   Anemia   Dementia without behavioral disturbance (HCC)     Brief hospital course: This is an 89 year old female with dementia, coronary artery disease, atrial fibrillation, recurrent UTIs who presents to the hospital for increasing confusion and lethargy from a skilled nursing facility.  She was hospitalized from 11/2 through 11/10 after a fall and treated with 5 days of IV ceftriaxone  for a suspected UTI although, the urine culture grew multiple species. In the ED: UA revealed large WBCs and rare bacteria.   CT abdomen pelvis revealed bilateral hydronephrosis and hydroureter distended to the level of urinary bladder. A Foley catheter was placed. She was also found to have a lower lobe infiltrate on chest x-ray. She was started on ceftriaxone  and azithromycin  and admitted to the hospital. She was also found to have atrial fibrillation with a heart rate of about 140.   Subjective:  Sitting up in a chair.  Remains confused.  Daughter at bedside.   Assessment and Plan: Principal Problem:   Acute urinary retention with bilateral hydronephrosis UTI - Urine culture reveals greater than 100,000 Enterococcus faecalis - She has been on ceftriaxone -after urine culture results, switch to amoxicillin  on 11/22-recommend 7 days for complicated UTI - We did remove her Foley catheter to give her attempts to void however she retained greater than 500  cc and could not void and therefore the Foley had to be replaced - Continue to hold mirabegron -discussed with her daughter that the patient will need a close follow-up with her primary urologist   Active Problems: Community-acquired pneumonia?  - Left basilar opacity noted on chest x-ray - Have spoken with daughte who does not recall that the patient had cough or shortness of breath prior to coming in-DC azithromycin  on 11/21 as she likely did not have pneumonia   Paroxysmal atrial fibrillation-with RVR - Rate likely elevated due to above issues - Has been rate controlled on Cardizem  CD 120 mg a day which is a new medication for her as it appears that she was not on a rate controlling agent previously - Continue Xarelto   Hypertension - Holding  losartan  as BP is low/normal on Cardizem   CKD stage III A   Anemia of chronic disease     Recurrent falls - PT recommended the patient transition to SNF - Will return to ALF per daughter's request - Ativan  1 mg BID changed to 0.5 mg PRN BID     Acute encephalopathy - With underlying dementia-does not follow commands and is quite confused at baseline -Increased confusion may have been related to urinary retention and/or UTI   Abnormal thyroid  functions - TSH 8.810 - Free T41.16 - Will need to recheck when the patient is not acutely ill-follow-up for signs of hyperthyroidism       Wound 11/25/23 1545 Pressure Injury Sacrum Mid Deep Tissue Pressure Injury - Purple or maroon localized area of discolored intact skin or blood-filled blister due to damage of underlying soft tissue from pressure and/or shear. (Active)  Discharge Instructions   Allergies as of 11/29/2023       Reactions   Ambien  [zolpidem  Tartrate] Other (See Comments)    Hallucinations. Allergy not listed on MAR    Celexa [citalopram] Other (See Comments)   Per patient, made the inside and outside of skin burn. Allergy not listed on MAR    Cymbalta [duloxetine Hcl]  Other (See Comments)   Allergy not listed on MAR    Ditropan Xl [oxybutynin Chloride] Nausea Only, Swelling, Other (See Comments)   Throat swelling, Burning eyelids. Allergy not listed on MAR    Lescol [fluvastatin] Other (See Comments)   Myalgias. Allergy not listed on MAR    Lipitor [atorvastatin] Other (See Comments)   Constipation, Hot flashes, Left-leaning gait. Allergy not listed on MAR    Lyrica  [pregabalin ] Other (See Comments)   Made patient feel funny. Allergy not listed on MAR    Neurontin [gabapentin] Other (See Comments)   Hallucinations. Allergy not listed on MAR    Pravastatin Other (See Comments)   Myalgias. Allergy not listed on MAR    Tape Other (See Comments)   Skin sensitive - tears and bruises easily. Allergy not listed on MAR    Zetia [ezetimibe] Other (See Comments)   Myalgias. Allergy not listed on MAR    Zocor [simvastatin] Other (See Comments)   Myalgias. Allergy not listed on MAR         Medication List     STOP taking these medications    losartan  25 MG tablet Commonly known as: COZAAR    mirabegron  ER 50 MG Tb24 tablet Commonly known as: MYRBETRIQ        TAKE these medications    acetaminophen  325 MG tablet Commonly known as: TYLENOL  Take 2 tablets (650 mg total) by mouth every 6 (six) hours as needed for mild pain (pain score 1-3) (or Fever >/= 101).   amoxicillin  500 MG capsule Commonly known as: AMOXIL  Take 1 capsule (500 mg total) by mouth 3 (three) times daily for 6 days.   diltiazem  120 MG 24 hr capsule Commonly known as: CARDIZEM  CD Take 1 capsule (120 mg total) by mouth daily.   divalproex  125 MG capsule Commonly known as: DEPAKOTE  SPRINKLE Take 125 mg by mouth 2 (two) times daily as needed (agitation).   donepezil  10 MG tablet Commonly known as: ARICEPT  Take 10 mg by mouth every evening.   hydrOXYzine  25 MG tablet Commonly known as: ATARAX  Take 1 tablet (25 mg total) by mouth at bedtime as needed. What changed:  when to take this   LORazepam  0.5 MG tablet Commonly known as: ATIVAN  Take 1-2 tablets (0.5-1 mg total) by mouth 2 (two) times daily as needed for anxiety. What changed:  medication strength how much to take when to take this reasons to take this   memantine  5 MG tablet Commonly known as: NAMENDA  Take 5 mg by mouth daily.   pantoprazole  40 MG tablet Commonly known as: PROTONIX  Take 40 mg by mouth daily.   pentosan polysulfate 100 MG capsule Commonly known as: ELMIRON  Take 100 mg by mouth daily.   polyethylene glycol 17 g packet Commonly known as: MIRALAX  / GLYCOLAX  Take 17 g by mouth daily.   QUEtiapine  25 MG tablet Commonly known as: SEROQUEL  Take 25 mg by mouth at bedtime.   Rivaroxaban  15 MG Tabs tablet Commonly known as: XARELTO  Take 1 tablet (15 mg total) by mouth daily with supper. What changed: when to take this   traMADol  50 MG tablet  Commonly known as: ULTRAM  Take 50 mg by mouth 3 (three) times daily as needed for moderate pain (pain score 4-6) or severe pain (pain score 7-10).   Vitamin D3 25 MCG (1000 UT) Caps Take 1,000 Units by mouth daily.        Follow-up Information     [facility] Follow up.   Why: ASAP Contact information: [address] [place]  [address] [phone]                   The results of significant diagnostics from this hospitalization (including imaging, microbiology, ancillary and laboratory) are listed below for reference.    DG CHEST PORT 1 VIEW Result Date: 11/24/2023 EXAM: 1 VIEW(S) XRAY OF THE CHEST 11/24/2023 08:46:00 PM COMPARISON: 11/07/2023 CLINICAL HISTORY: Confusion FINDINGS: LUNGS AND PLEURA: Left basilar patchy opacity. Trace left pleural effusion. No pneumothorax. HEART AND MEDIASTINUM: Atherosclerotic calcifications. No acute abnormality of the cardiac and mediastinal silhouettes. BONES AND SOFT TISSUES: No acute osseous abnormality. IMPRESSION: 1. Left basilar  patchy opacity, which may reflect infection or atelectasis. 2. Trace left pleural effusion. Electronically signed by: Morgane Naveau MD 11/24/2023 08:54 PM EST RP Workstation: [id number]   CT ABDOMEN PELVIS W CONTRAST Result Date: 11/24/2023 EXAM: CT ABDOMEN AND PELVIS WITH CONTRAST 11/24/2023 04:26:51 PM TECHNIQUE: CT of the abdomen and pelvis was performed with the administration of 80 mL iohexol  (OMNIPAQUE ) 300 MG/ML solution. Multiplanar reformatted images are provided for review. Automated exposure control, iterative reconstruction, and/or weight-based adjustment of the mA/kV was utilized to reduce the radiation dose to as low as reasonably achievable. COMPARISON: None available. CLINICAL HISTORY: Abdominal pain, confusion; fall; inability to walk FINDINGS: LOWER CHEST: Coronary and descending thoracic aortic atherosclerotic vascular calcifications. Calcified granuloma posteriorly in the left lower lobe. Mild dependent atelectasis in both lower lobes. Mild cardiomegaly. LIVER: Stable hypodense liver lesions, likely cysts, warranting no further imaging workup. GALLBLADDER AND BILE DUCTS: Gallbladder is unremarkable. No biliary ductal dilatation. SPLEEN: No acute abnormality. PANCREAS: No acute abnormality. ADRENAL GLANDS: No acute abnormality. KIDNEYS, URETERS AND BLADDER: 2.7 cm simple right mid kidney cyst warrants no further imaging workup. Prominent bilateral hydronephrosis and moderate bilateral hydroureter extending down to the distended urinary bladder. The urinary bladder measures 17.2 x 10.8 x 11.4 cm, with a calculated volume of 1109 ml. Streak artifact in the patient's left hip implant partially obscures the vicinity of the urethra. The appearance raises concern for possible bladder outlet or urethral obstruction. No urinary tract calculi identified. No perinephric or periureteral stranding. GI AND BOWEL: Stomach demonstrates no acute abnormality. There is no bowel obstruction. No current findings  of bowel distress such as pneumatosis, bowel wall thickening, or mesenteric edema. Low position of the anorectal junction suggesting pelvic floor laxity. PERITONEUM AND RETROPERITONEUM: No ascites. No free air. VASCULATURE: Considerable abdominal aortic atheromatous vascular calcification noted. Substantial atheromatous plaque severely narrowing the proximal superior mesenteric artery; poor visualization of the proximal celiac trunk which is likely occluded, with reconstitution via collaterals from the superior mesenteric artery. This predisposes the patient to mesenteric ischemia including chronic mesenteric ischemia. LYMPH NODES: No lymphadenopathy. REPRODUCTIVE ORGANS: Uterus absent. The possibility of mucosal thickening along the vaginal vestibule is not excluded. BONES AND SOFT TISSUES: Loss of disc height at L4-L5 with lower lumbar spondylosis. 3 mm grade 1 degenerative anterolisthesis at L5-S1. Left total hip prosthesis. No focal soft tissue abnormality. IMPRESSION: 1. Prominent bilateral hydronephrosis and moderate bilateral hydroureter to a markedly distended urinary bladder, concerning  for bladder outlet or urethral obstruction; no urinary tract calculi identified. 2. 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 3. Loss of disc height at L4-L5 with lower lumbar spondylosis. 4. Grade 1 degenerative anterolisthesis at L5-S1. Electronically signed by: Ryan Salvage MD 11/24/2023 05:03 PM EST RP Workstation: [id number]   CT Head Wo Contrast Result Date: 11/24/2023 EXAM: CT HEAD WITHOUT CONTRAST 11/24/2023 04:26:51 PM TECHNIQUE: CT of the head was performed without the administration of intravenous contrast. Automated exposure control, iterative reconstruction, and/or weight based adjustment of the mA/kV was utilized to reduce the radiation  dose to as low as reasonably achievable. COMPARISON: CT head without contrast 11/07/2023. MR head without contrast 11/07/2023. CLINICAL HISTORY: Delirium FINDINGS: BRAIN AND VENTRICLES: Moderate generalized cerebral and cerebellar volume loss. Moderate periventricular and deep cerebral white matter disease. Atherosclerotic calcifications in cavernous internal carotid arteries. No acute hemorrhage. No evidence of acute infarct. No hydrocephalus. No extra-axial collection. No mass effect or midline shift. ORBITS: Bilateral lens replacement. SINUSES: Mucosal thickening in bilateral ethmoid sinuses. Trace left mastoid effusion. SOFT TISSUES AND SKULL: No acute soft tissue abnormality. No skull fracture. IMPRESSION: 1. No acute intracranial abnormality. 2. Stable moderate  generalized cerebral and cerebellar volume loss. 3. Stable moderate periventricular and deep cerebral white matter disease. Electronically signed by: Lonni Necessary MD 11/24/2023 04:44 PM EST RP Workstation: [id number]   CT Cervical Spine Wo Contrast Result Date: 11/24/2023 EXAM: CT CERVICAL SPINE WITH CONTRAST 11/24/2023 04:26:51 PM TECHNIQUE: CT of the cervical spine was performed with the administration of 80 mL of iohexol  (OMNIPAQUE ) 300 MG/ML solution. Multiplanar reformatted images are provided for review. Automated exposure control, iterative reconstruction, and/or weight based adjustment of the mA/kV was utilized to reduce the radiation dose to as low as reasonably achievable. COMPARISON: CT cervical spine 11/30/2023. CLINICAL HISTORY: Neck trauma (Age >= 65y); reports leg pain. Fall. Increased confusion. FINDINGS: CERVICAL SPINE: BONES AND ALIGNMENT: No acute fracture or traumatic malalignment. Ankylosis of the cervical spine is present at C2-C3 and C4-C5. Slight degenerative anterolisthesis present at C4-C5. Straightening of the normal cervical lordosis is present. DEGENERATIVE CHANGES: Ankylosis of the cervical spine is present at  C2-C3 and C4-C5. Slight degenerative anterolisthesis present at C4-C5. Straightening of the normal cervical lordosis is present. SOFT TISSUES: No prevertebral soft tissue swelling. Atherosclerotic calcifications are present at the carotid bifurcations bilaterally without significant stenosis. IMPRESSION: 1. No acute abnormality of the cervical spine related to the reported neck trauma. 2. Ankylosis of the cervical spine at C2-3 and C4-5 with slight degenerative anterolisthesis at C4-5. 3. Straightening of the normal cervical lordosis. Electronically signed by: Lonni Necessary MD 11/24/2023 04:42 PM EST RP Workstation: [id number]   VAS US  LOWER EXTREMITY VENOUS (DVT) (ONLY [facility] & [facility]) Result Date: 11/24/2023  Lower Venous DVT Study Patient Name:  Christine [name]  Date of Exam:   11/24/2023 Medical Rec #: [id number]        Accession #:    [id number] Date of Birth: [birth year]        Patient Gender: F Patient Age:   89 years Exam Location:  [facility] Procedure:      VAS US  LOWER EXTREMITY VENOUS (DVT) Referring Phys: ROCKY MASSY --------------------------------------------------------------------------------  Indications: Pain.  Risk Factors: None identified. Limitations: Poor ultrasound/tissue interface and patient positioning, patient immobility, patient pain tolerance. Comparison Study: No prior studies. Performing Technologist: Cordella Collet RVT  Examination Guidelines:  A complete evaluation includes B-mode imaging, spectral Doppler, color Doppler, and power Doppler as needed of all accessible portions of each vessel. Bilateral testing is considered an integral part of a complete examination. Limited examinations for reoccurring indications may be performed as noted. The reflux portion of the exam is performed with the patient in reverse Trendelenburg.  +---------+---------------+---------+-----------+----------+--------------+ RIGHT    CompressibilityPhasicitySpontaneityPropertiesThrombus  Aging +---------+---------------+---------+-----------+----------+--------------+ CFV      Full           Yes      Yes                                 +---------+---------------+---------+-----------+----------+--------------+ SFJ      Full                                                        +---------+---------------+---------+-----------+----------+--------------+ FV Prox  Full                                                        +---------+---------------+---------+-----------+----------+--------------+ FV Mid   Full                                                        +---------+---------------+---------+-----------+----------+--------------+ FV Distal               Yes      Yes                                 +---------+---------------+---------+-----------+----------+--------------+ PFV      Full                                                        +---------+---------------+---------+-----------+----------+--------------+ POP      Full           Yes      Yes                                 +---------+---------------+---------+-----------+----------+--------------+ PTV      Full                                                        +---------+---------------+---------+-----------+----------+--------------+ PERO     Full                                                        +---------+---------------+---------+-----------+----------+--------------+   +---------+---------------+---------+-----------+----------+-------------------+  LEFT     CompressibilityPhasicitySpontaneityPropertiesThrombus Aging      +---------+---------------+---------+-----------+----------+-------------------+ CFV      Full           Yes      Yes                                      +---------+---------------+---------+-----------+----------+-------------------+ SFJ      Full                                                              +---------+---------------+---------+-----------+----------+-------------------+ FV Prox  Full                                                             +---------+---------------+---------+-----------+----------+-------------------+ FV Mid   Full                                                             +---------+---------------+---------+-----------+----------+-------------------+ FV Distal               Yes      Yes                                      +---------+---------------+---------+-----------+----------+-------------------+ PFV      Full                                                             +---------+---------------+---------+-----------+----------+-------------------+ POP      Full           Yes      Yes                                      +---------+---------------+---------+-----------+----------+-------------------+ PTV      Full                                                             +---------+---------------+---------+-----------+----------+-------------------+ PERO                                                  Not well visualized +---------+---------------+---------+-----------+----------+-------------------+     Summary: RIGHT: - There is no evidence of  deep vein thrombosis in the lower extremity. However, portions of this examination were limited- see technologist comments above.  - No cystic structure found in the popliteal fossa.  LEFT: - There is no evidence of deep vein thrombosis in the lower extremity. However, portions of this examination were limited- see technologist comments above.  - No cystic structure found in the popliteal fossa.  *See table(s) above for measurements and observations. Electronically signed by Lonni Gaskins MD on 11/24/2023 at 4:29:06 PM.    Final    ECHOCARDIOGRAM COMPLETE Result Date: 11/08/2023    ECHOCARDIOGRAM REPORT   Patient Name:   Christine [name] Date of Exam: 11/08/2023 Medical Rec  #:  [id number]       Height:       64.0 in Accession #:    [id number]      Weight:       124.3 lb Date of Birth:  [birth year]       BSA:          1.599 m Patient Age:    89 years        BP:           148/88 mmHg Patient Gender: F               HR:           116 bpm. Exam Location:  Inpatient Procedure: 2D Echo, Cardiac Doppler and Color Doppler (Both Spectral and Color            Flow Doppler were utilized during procedure). Indications:    Atrial Fibrillation I48.91, Stroke I63.9  History:        Patient has prior history of Echocardiogram examinations, most                 recent 01/30/2022. Previous Myocardial Infarction, TIA,                 Arrythmias:Atrial Fibrillation, Signs/Symptoms:Chest Pain and                 Syncope; Risk Factors:Hypertension and Dyslipidemia.  Sonographer:    Thea Norlander RCS Referring Phys: MARIO GAILS PATEL IMPRESSIONS  1. Left ventricular ejection fraction, by estimation, is 55 to 60%. The left ventricle has normal function. The left ventricle has no regional wall motion abnormalities. Indeterminate diastolic filling due to E-A fusion.  2. Right ventricular systolic function is normal. The right ventricular size is normal.  3. The mitral valve is normal in structure. Mild to moderate mitral valve regurgitation. No evidence of mitral stenosis.  4. The aortic valve is normal in structure. There is moderate calcification of the aortic valve. There is mild thickening of the aortic valve. Aortic valve regurgitation is trivial. No aortic stenosis is present.  5. The inferior vena cava is dilated in size with <50% respiratory variability, suggesting right atrial pressure of 15 mmHg. FINDINGS  Left Ventricle: Left ventricular ejection fraction, by estimation, is 55 to 60%. The left ventricle has normal function. The left ventricle has no regional wall motion abnormalities. The left ventricular internal cavity size was normal in size. There is  no left ventricular hypertrophy. Indeterminate  diastolic filling due to E-A fusion. Right Ventricle: The right ventricular size is normal. No increase in right ventricular wall thickness. Right ventricular systolic function is normal. Left Atrium: Left atrial size was normal in size. Right Atrium: Right atrial size was normal in size. Pericardium: There is no evidence of pericardial effusion. Mitral Valve: The mitral  valve is normal in structure. Mild to moderate mitral valve regurgitation. No evidence of mitral valve stenosis. Tricuspid Valve: The tricuspid valve is normal in structure. Tricuspid valve regurgitation is mild . No evidence of tricuspid stenosis. Aortic Valve: The aortic valve is normal in structure. There is moderate calcification of the aortic valve. There is mild thickening of the aortic valve. Aortic valve regurgitation is trivial. No aortic stenosis is present. Aortic valve peak gradient measures 9.5 mmHg. Pulmonic Valve: The pulmonic valve was normal in structure. Pulmonic valve regurgitation is trivial. No evidence of pulmonic stenosis. Aorta: The aortic root is normal in size and structure. Venous: The inferior vena cava is dilated in size with less than 50% respiratory variability, suggesting right atrial pressure of 15 mmHg. IAS/Shunts: No atrial level shunt detected by color flow Doppler.  LEFT VENTRICLE PLAX 2D LVIDd:         3.70 cm   Diastology LVIDs:         2.40 cm   LV e' medial:    9.46 cm/s LV PW:         1.10 cm   LV E/e' medial:  11.1 LV IVS:        0.90 cm   LV e' lateral:   18.40 cm/s LVOT diam:     1.90 cm   LV E/e' lateral: 5.7 LV SV:         50 LV SV Index:   32 LVOT Area:     2.84 cm  RIGHT VENTRICLE             IVC RV S prime:     30.00 cm/s  IVC diam: 2.20 cm TAPSE (M-mode): 1.5 cm LEFT ATRIUM             Index        RIGHT ATRIUM           Index LA diam:        2.90 cm 1.81 cm/m   RA Area:     12.00 cm LA Vol (A2C):   43.7 ml 27.34 ml/m  RA Volume:   23.40 ml  14.64 ml/m LA Vol (A4C):   27.9 ml 17.45 ml/m LA  Biplane Vol: 34.6 ml 21.64 ml/m  AORTIC VALVE AV Area (Vmax): 2.29 cm AV Vmax:        154.00 cm/s AV Peak Grad:   9.5 mmHg LVOT Vmax:      124.33 cm/s LVOT Vmean:     80.767 cm/s LVOT VTI:       0.178 m  AORTA Ao Root diam: 3.30 cm Ao Asc diam:  3.30 cm MITRAL VALVE                TRICUSPID VALVE MV Area (PHT): 4.96 cm     TR Peak grad:   226576.0 mmHg MV Decel Time: 153 msec     TR Vmax:        23800.00 cm/s MV E velocity: 105.00 cm/s MV A velocity: 44.60 cm/s   SHUNTS MV E/A ratio:  2.35         Systemic VTI:  0.18 m                             Systemic Diam: 1.90 cm Joelle Azobou Tonleu Electronically signed by Joelle Cedars Tonleu Signature Date/Time: 11/08/2023/12:37:13 PM    Final    CT Hip Left Wo Contrast Result Date: 11/07/2023 CLINICAL DATA:  Hip trauma, fell EXAM: CT OF THE LEFT HIP WITHOUT CONTRAST TECHNIQUE: Multidetector CT imaging of the left hip was performed according to the standard protocol. Multiplanar CT image reconstructions were also generated. RADIATION DOSE REDUCTION: This exam was performed according to the departmental dose-optimization program which includes automated exposure control, adjustment of the mA and/or kV according to patient size and/or use of iterative reconstruction technique. COMPARISON:  11/07/2023, 11/06/2023 FINDINGS: Bones/Joint/Cartilage Stable appearance of the left hip arthroplasty. No evidence of metallic hardware failure or loosening. There are no acute displaced fractures. Stable spondylosis and facet hypertrophy at the lumbosacral junction. Ligaments Suboptimally assessed by CT. Muscles and Tendons No acute findings. Soft tissues No evidence of soft tissue swelling. Atherosclerosis of the left iliac and femoral vessels. Reconstructed images demonstrate no additional findings. IMPRESSION: 1. Stable left hip arthroplasty. 2. No acute displaced fracture. Electronically Signed   By: Ozell Daring M.D.   On: 11/07/2023 19:43   MR BRAIN WO CONTRAST Result  Date: 11/07/2023 EXAM: MRI BRAIN WITHOUT CONTRAST 11/07/2023 05:01:02 PM TECHNIQUE: Multiplanar multisequence MRI of the head/brain was performed without the administration of intravenous contrast. COMPARISON: Head CT 11/07/2023 and MRI 12/21/2015. CLINICAL HISTORY: aphasia FINDINGS: LIMITATIONS: The examination is mildly motion degraded. Axial T1 and coronal T2 sequences were not submitted. BRAIN AND VENTRICLES: There is no evidence of an acute infarct, intracranial hemorrhage, mass, midline shift, hydrocephalus, or extra-axial fluid collection. There is advanced cerebral atrophy. Patchy to confluent T2 hyperintensities in the cerebral white matter bilaterally have mildly progressed from the prior MRI and are nonspecific but compatible with moderate to severe chronic small vessel ischemic disease. Mild to moderate chronic small vessel changes are present in the pons. Major intracranial vascular flow voids are preserved. ORBITS: Bilateral cataract extraction. SINUSES AND MASTOIDS: Extensive bilateral ethmoid air cell opacification. Mild mucosal thickening in the other paranasal sinuses. Small left mastoid effusion. BONES AND SOFT TISSUES: Normal marrow signal. No acute soft tissue abnormality. IMPRESSION: 1. No acute intracranial abnormality. 2. Advanced cerebral atrophy and chronic small vessel ischemic disease. Electronically signed by: Dasie Hamburg MD 11/07/2023 05:33 PM EST RP Workstation: [id number]   DG Ankle Complete Right Result Date: 11/07/2023 CLINICAL DATA:  Un witnessed fall EXAM: RIGHT ANKLE - COMPLETE 3+ VIEW; DG FOOT COMPLETE 3+V*R* COMPARISON:  None Available. FINDINGS: Right ankle: Frontal, oblique, and lateral views are obtained. Evaluation slightly limited by patient positioning. No acute displaced fracture, subluxation, or dislocation. Mild hindfoot osteoarthritis. Soft tissues are unremarkable. Right foot: Frontal, oblique, and lateral views are obtained. No acute displaced fracture. Moderate  hallux valgus deformity with osteoarthritis at the first metatarsophalangeal joint. Mild midfoot osteoarthritis. Soft tissues are unremarkable. IMPRESSION: 1. No acute fracture of the right foot or ankle. 2. Multifocal osteoarthritis. 3. Hallux valgus deformity. Electronically Signed   By: Ozell Daring M.D.   On: 11/07/2023 16:30   DG Foot Complete Right Result Date: 11/07/2023 CLINICAL DATA:  Un witnessed fall EXAM: RIGHT ANKLE - COMPLETE 3+ VIEW; DG FOOT COMPLETE 3+V*R* COMPARISON:  None Available. FINDINGS: Right ankle: Frontal, oblique, and lateral views are obtained. Evaluation slightly limited by patient positioning. No acute displaced fracture, subluxation, or dislocation. Mild hindfoot osteoarthritis. Soft tissues are unremarkable. Right foot: Frontal, oblique, and lateral views are obtained. No acute displaced fracture. Moderate hallux valgus deformity with osteoarthritis at the first metatarsophalangeal joint. Mild midfoot osteoarthritis. Soft tissues are unremarkable. IMPRESSION: 1. No acute fracture of the right foot or ankle. 2. Multifocal osteoarthritis. 3. Hallux valgus deformity. Electronically Signed  By: Ozell Daring M.D.   On: 11/07/2023 16:30   CT Cervical Spine Wo Contrast Result Date: 11/07/2023 CLINICAL DATA:  Clemens, history of it dimension EXAM: CT CERVICAL SPINE WITHOUT CONTRAST TECHNIQUE: Multidetector CT imaging of the cervical spine was performed without intravenous contrast. Multiplanar CT image reconstructions were also generated. RADIATION DOSE REDUCTION: This exam was performed according to the departmental dose-optimization program which includes automated exposure control, adjustment of the mA and/or kV according to patient size and/or use of iterative reconstruction technique. COMPARISON:  07/14/2011 FINDINGS: Alignment: Reversal cervical lordosis with kyphosis centered at the C5-6 level, likely due to multilevel degenerative changes. Skull base and vertebrae: No acute  fracture. No primary bone lesion or focal pathologic process. Soft tissues and spinal canal: No prevertebral fluid or swelling. No visible canal hematoma. Disc levels: Marked hypertrophic changes at the C1-C2 interface. Partial bony fusion across the disc space and bilateral facet joints at C2-3. Bony fusion across the bilateral facets at C4-5. There is multilevel spondylosis most pronounced at the C4-5, C5-6, and C6-7 levels. Diffuse multilevel facet hypertrophy greatest at C3-4. Upper chest: There is a spiculated subpleural 1.2 x 0.9 cm left upper lobe pulmonary nodule, concerning for underlying neoplasm. Airway is patent. Other: Reconstructed images demonstrate no additional findings. IMPRESSION: 1. No acute cervical spine fracture. 2. Suspicious left solid pulmonary nodule within the upper lobe measuring 12 mm detected on incomplete chest CT. Per Fleischner Society Guidelines, recommend prompt non-contrast Chest CT for further evaluation if the patient would be a therapy candidate should neoplasm be detected. Reference: Radiology. 2017; 284(1):228-43. 3. Extensive multilevel cervical degenerative changes as above. Electronically Signed   By: Ozell Daring M.D.   On: 11/07/2023 12:50   CT Head Wo Contrast Result Date: 11/07/2023 CLINICAL DATA:  Clemens, history of dementia EXAM: CT HEAD WITHOUT CONTRAST TECHNIQUE: Contiguous axial images were obtained from the base of the skull through the vertex without intravenous contrast. RADIATION DOSE REDUCTION: This exam was performed according to the departmental dose-optimization program which includes automated exposure control, adjustment of the mA and/or kV according to patient size and/or use of iterative reconstruction technique. COMPARISON:  09/15/2022 FINDINGS: Brain: Stable chronic small-vessel ischemic changes throughout the periventricular white matter. No acute infarct or hemorrhage. Lateral ventricles and remaining midline structures are stable. No acute  extra-axial fluid collections. No mass effect. Stable diffuse cerebral cortical atrophy. Vascular: No hyperdense vessel or unexpected calcification. Skull: Normal. Negative for fracture or focal lesion. Sinuses/Orbits: Mucosal thickening throughout the ethmoid air cells. No gas fluid levels. Trace left mastoid effusion. Other: None. IMPRESSION: 1. Stable head CT, no acute intracranial process. Electronically Signed   By: Ozell Daring M.D.   On: 11/07/2023 12:46   DG Knee 2 Views Left Result Date: 11/07/2023 EXAM: 1 or 2 VIEW(S) XRAY OF THE LEFT KNEE 11/07/2023 11:12:00 AM COMPARISON: None available. CLINICAL HISTORY: fall FINDINGS: BONES AND JOINTS: No acute fracture. No focal osseous lesion. No joint dislocation. Possible small suprapatellar joint effusion is noted. Severe narrowing of lateral joint space is noted. SOFT TISSUES: The soft tissues are unremarkable. IMPRESSION: 1. Severe lateral compartment joint space narrowing, consistent with advanced osteoarthritis. 2. Possible small suprapatellar joint effusion. Electronically signed by: Lynwood Seip MD 11/07/2023 11:20 AM EST RP Workstation: [id number]   DG Hip Unilat W or Wo Pelvis 2-3 Views Left Result Date: 11/07/2023 EXAM: 2 or 3 VIEW(S) XRAY OF THE LEFT HIP 11/07/2023 11:12:00 AM COMPARISON: 06/11/2016 status post left total hip arthroplasty. CLINICAL HISTORY: fall  fall FINDINGS: BONES AND JOINTS: Status post left total hip arthroplasty. No acute fracture or focal osseous lesion. SOFT TISSUES: The soft tissues are unremarkable. IMPRESSION: 1. No acute fracture or dislocation. 2. Status post left total hip arthroplasty. Electronically signed by: Lynwood Seip MD 11/07/2023 11:19 AM EST RP Workstation: [id number]   DG Ankle Complete Left Result Date: 11/07/2023 EXAM: 3 OR MORE VIEWS XRAY OF THE LEFT ANKLE 11/07/2023 11:12:00 AM CLINICAL HISTORY: Fall. COMPARISON: None available. FINDINGS: BONES AND JOINTS: No acute fracture. No focal osseous lesion.  No joint dislocation. SOFT TISSUES: The soft tissues are unremarkable. IMPRESSION: 1. No acute osseous abnormality. Electronically signed by: Lynwood Seip MD 11/07/2023 11:17 AM EST RP Workstation: [id number]   DG Chest Port 1 View Result Date: 11/07/2023 EXAM: 1 VIEW(S) XRAY OF THE CHEST 11/07/2023 11:12:00 AM COMPARISON: 01/29/2022 CLINICAL HISTORY: Questionable sepsis - evaluate for abnormality FINDINGS: LUNGS AND PLEURA: No focal pulmonary opacity. No pulmonary edema. No pleural effusion. No pneumothorax. HEART AND MEDIASTINUM: No acute abnormality of the cardiac and mediastinal silhouettes. BONES AND SOFT TISSUES: No acute osseous abnormality. IMPRESSION: 1. No acute cardiopulmonary disease. Electronically signed by: Lynwood Seip MD 11/07/2023 11:15 AM EST RP Workstation: [id number]   CT Renal Stone Study Result Date: 11/06/2023 CLINICAL DATA:  Abdominal and flank pain. EXAM: CT ABDOMEN AND PELVIS WITHOUT CONTRAST TECHNIQUE: Multidetector CT imaging of the abdomen and pelvis was performed following the standard protocol without IV contrast. RADIATION DOSE REDUCTION: This exam was performed according to the departmental dose-optimization program which includes automated exposure control, adjustment of the mA and/or kV according to patient size and/or use of iterative reconstruction technique. COMPARISON:  02/18/2010 FINDINGS: Lower chest: No acute findings. Hepatobiliary: Several small hepatic cysts are again noted. No hepatic masses seen on this unenhanced exam. Gallbladder is unremarkable. No evidence of biliary ductal dilatation. Pancreas: No mass or inflammatory process visualized on this unenhanced exam. Spleen:  Within normal limits in size. Adrenals/Urinary tract: A few punctate left renal calculi are noted. No evidence of ureteral calculi or hydronephrosis. Poor visualization of distal ureters and bladder due to severe artifact from left hip prostheses. Stomach/Bowel: No evidence of obstruction,  inflammatory process, or abnormal fluid collections. Normal appendix visualized. Diverticulosis is seen mainly involving the sigmoid colon, however there is no evidence of diverticulitis. Vascular/Lymphatic: No pathologically enlarged lymph nodes identified. No evidence of abdominal aortic aneurysm. Reproductive: Prior hysterectomy noted. Adnexal regions are unremarkable in appearance. Other:  None. Musculoskeletal:  No suspicious bone lesions identified. IMPRESSION: Tiny left renal calculi. No evidence of ureteral calculi, hydronephrosis, or other acute findings. Colonic diverticulosis, without radiographic evidence of diverticulitis. Electronically Signed   By: Norleen DELENA Kil M.D.   On: 11/06/2023 10:27   Labs:   Basic Metabolic Panel: Recent Labs  Lab 11/24/23 1421 11/24/23 2122 11/24/23 2123 11/25/23 0500 11/25/23 1550  NA 141 136  --  137 138  K 5.0 4.7  --  4.3 4.4  CL 105 103  --  104 106  CO2 24 23  --  22 22  GLUCOSE 112* 117*  --  153* 101*  BUN 23 21  --  19 20  CREATININE 1.22* 1.08*  --  1.02* 0.95  CALCIUM  9.4 9.0  --  8.9 8.7*  MG  --   --  2.1 2.1  --   PHOS  --   --  4.1 4.0  --      CBC: Recent Labs  Lab 11/24/23 1421 11/25/23 0500  WBC 9.2  6.3  NEUTROABS 7.1  --   HGB 10.4* 8.4*  HCT 32.4* 26.4*  MCV 89.8 91.3  PLT 351 281         SIGNED:   True Atlas, MD  [facility] 11/29/2023, 12:06 PM Time taking on discharge: 50 minutes

## 2023-11-28 NOTE — Discharge Instructions (Signed)
 Cardizem  added for atrial fibrillation with a rapid rate Ativan  dose reduced to help prevent increased somnolence and falls Thyroid  functions will need to be checked at the next doctor's visit  You were cared for by a hospitalist during your hospital stay. Please review all of you discharge paperwork on the day of discharge and be sure you have all of your prescribed medications and please read the below instructions:  Once you are discharged, your primary care physician will handle any further medical issues. Please note that NO REFILLS for any discharge medications will be authorized once you are discharged as it is imperative that you return to your primary care physician (or establish a relationship with a primary care physician if you do not have one) for your aftercare needs. Please obtain a follow up appointment with your primary care physician within 1-2 weeks of discharge. Please take all your medications with you for your next visit with your Primary MD. Please request your Primary MD to go over all Hospital Tests and Procedures, Radiological results at the follow up appointment. In some cases, there will be blood work, cultures and biopsy results pending at the time of your discharge. Please request that your primary care M.D. goes through all the records of your hospital data and follows up on these results. Please get all hospital records sent to your primary MD by signing hospital release before you go home or request your primary care doctor's office to assist with obtaining medical records.   You must read complete instructions/literature along with all the possible adverse reactions/side effects for all the medicines that have been prescribed to you. Take any new medicines after you have completely understood and accpet all the possible adverse reactions/side effects.  Please take medications as prescribed and speak with your doctor if changes are needed.   If you have smoked or  chewed tobacco in the last 2 yrs please stop. Stop any regular alcohol   and or any recreational drug use. Wear Seat belts while driving.   If you had Pneumonia at the Hospital: Please get a 2 view Chest X ray done in 6-8 weeks after hospital discharge or sooner if instructed by your Primary MD.   If you have Congestive Heart Failure: Follow a cardiac low salt diet and 1.5 lit/day fluid restriction. Please call your Cardiologist or Primary MD anytime you have any of the following symptoms:  1) 3 pound weight gain in 24 hours or 5 pounds in 1 week  2) shortness of breath, with or without a dry hacking cough  3) increasing swelling in the feet or stomach  4) if you have to sleep on extra pillows at night in order to breathe   If you have Diabetes: Check blood sugars 4 times/day- once on AM empty stomach and then before each meal. Log in all results and show them to your primary doctor at your next visit. If glucose readings are often under 60 or above 400 call your primary MD to see if medication dosages need to be adjusted   If you have Syncope (passing out) or Seizure/Convulsions/Epilepsy: Please do not drive, operate heavy machinery, participate in activities at heights or participate in high speed sports until you have seen by Primary MD or a Neurologist and advised to do so again. Per [employer]  DMV statutes, patients with seizures are not allowed to drive until they have been seizure-free for six months.  Use caution when using heavy equipment or power tools. Avoid working  on ladders or at heights. Take showers instead of baths. Ensure the water  temperature is not too high on the home water  heater. Do not go swimming alone. Do not lock yourself in a room alone (i.e. bathroom). When caring for infants or small children, sit down when holding, feeding, or changing them to minimize risk of injury to the child in the event you have a seizure. Maintain good sleep hygiene. Avoid alcohol .     If you had Gastrointestinal Bleeding: Please ask your Primary MD to check a complete blood count within one week of discharge or at your next visit. Your endoscopic/colonoscopic biopsies that are pending at the time of discharge will also need to followed by your Primary MD.    Rosine can reach the hospitalist office at phone [phone] or fax [phone]   If you do not have a primary care physician, you can call [phone] for a physician referral.

## 2023-11-29 DIAGNOSIS — R338 Other retention of urine: Secondary | ICD-10-CM | POA: Diagnosis not present

## 2023-11-29 LAB — MRSA NEXT GEN BY PCR, NASAL: MRSA by PCR Next Gen: NOT DETECTED

## 2023-11-29 NOTE — TOC Transition Note (Signed)
 Transition of Care [facility]) - Discharge Note  Patient Details  Name: Christine [name] MRN: [id number] Date of Birth: 03-03-1933  Transition of Care [facility]) CM/SW Contact:  Christine [name] Aran, Christine [name] Phone Number: 11/29/2023, 1:31 PM  Clinical Narrative: Patient will return to [facility] memory care today. Christine [name] with memory care completed her in-person assessment. FL2 done. Daughter requested [facility] for HHPT/OT/RN. CSW made [facility] referral in hub, which was accepted. HH orders placed by hospitalist. Christine [name] requested that daughter bring the [facility] and discharge summary with her to the facility. CSW provided discharge paperwork to daughter. Daughter to transport patient back to facility. Care management signing off.  Final next level of care: Memory Care Barriers to Discharge: Barriers Resolved  Patient Goals and CMS Choice Patient states their goals for this hospitalization and ongoing recovery are:: Return to [facility] memory care CMS Medicare.gov Compare Post Acute Care list provided to:: Patient Represenative (must comment) Choice offered to / list presented to : Adult Children  Discharge Placement     Patient chooses bed at: Other - please specify in the comment section below: Christine [name] memory care) Patient to be transferred to facility by: Daughter Patient and family notified of of transfer: 11/29/23  Discharge Plan and Services Additional resources added to the After Visit Summary for   In-house Referral: Clinical Social Work Post Acute Care Choice: NA          DME Arranged: N/A DME Agency: NA HH Arranged: PT, OT, RN HH Agency: [facility] Date HH Agency Contacted: 11/29/23 Representative spoke with at [facility] Agency: Referral made in hub  Social Drivers of Health (SDOH) Interventions SDOH Screenings   Food Insecurity: No Food Insecurity (11/25/2023)  Housing: Low Risk  (11/25/2023)  Transportation Needs: No Transportation Needs (11/25/2023)  Utilities: Not At  Risk (11/25/2023)  Social Connections: Patient Declined (11/25/2023)  Tobacco Use: Low Risk  (11/24/2023)   Readmission Risk Interventions    11/26/2023    2:47 PM  Readmission Risk Prevention Plan  Transportation Screening Complete  HRI or Home Care Consult Complete  Social Work Consult for Recovery Care Planning/Counseling Complete  Palliative Care Screening Not Applicable  Medication Review [employer]) Complete

## 2023-11-29 NOTE — [place] FL2 (Signed)
 [place]  MEDICAID FL2 LEVEL OF CARE FORM     IDENTIFICATION  Patient Name: Christine [name] Birthdate: January 24, 1933 Sex: adult Admission Date (Current Location): 11/24/2023  [facility] and [place] Number:  Producer, Television/film/video and Address:  [facility],  [address], [place] [address]      Provider Number: [id number]  Attending Physician Name and Address:  Earley Saucer, MD  Relative Name and Phone Number:  [name] Clause (daughter) Ph: [phone]    Current Level of Care: Hospital Recommended Level of Care: Memory Care Prior Approval Number:    Date Approved/Denied:   PASRR Number:    Discharge Plan: Other (Comment) (Memory care)    Current Diagnoses: Patient Active Problem List   Diagnosis Date Noted   Acute urinary retention 11/24/2023   Protein-calorie malnutrition, severe 11/08/2023   Syncope 01/30/2022   Syncope and collapse 01/30/2022   Malnutrition of moderate degree 03/15/2021   Vitamin B12 deficiency 03/14/2021   Dyslipidemia 03/14/2021   Recurrent falls 03/14/2021   NSTEMI (non-ST elevated myocardial infarction) (HCC) 03/13/2021   History of kidney stones 10/23/2020   Dementia without behavioral disturbance (HCC) 08/30/2020   Pain in right knee 08/30/2020   Vitamin D  deficiency 08/30/2020   Bilateral impacted cerumen 08/22/2019   Thrombophilia 03/02/2019   Increased frequency of urination 10/14/2017   Impacted cerumen 08/04/2016   Age-related osteoporosis without current pathological fracture 07/06/2016   Hip fracture (HCC) 05/29/2016   Closed left hip fracture (HCC) 05/27/2016   Insomnia 12/17/2015   Memory loss 12/17/2015   Dysuria 12/04/2015   AKI (acute kidney injury) 10/01/2015   Acute encephalopathy 10/01/2015   Interstitial cystitis 10/01/2015   Essential hypertension 10/01/2015   Rhabdomyolysis 09/30/2015   Major depression, single episode 07/10/2015   Non-thrombocytopenic purpura 07/10/2015   Encounter for general adult  medical examination without abnormal findings 11/30/2014   Fall 08/07/2013   Pain in left leg 08/07/2013   Lower urinary tract infectious disease 07/16/2013   Paroxysmal atrial fibrillation (HCC) 07/16/2013   Chest pain 07/16/2013   Transient ischemic attack 07/04/2012   Chronic pain syndrome 03/28/2012   Residual hemorrhoidal skin tags 01/18/2012   Skin sensation disturbance 03/03/2010   Abdominal pain 02/03/2010   Pain in limb 10/07/2009   Anemia 03/26/2009   Allergic rhinitis 02/27/2009   Anxiety disorder 02/27/2009   Hyperlipidemia 02/27/2009   Low compliance bladder 02/27/2009   DYSPEPSIA 01/18/2008   ADENOMATOUS COLONIC POLYP 01/17/2008   HEMORRHOIDS, INTERNAL 01/17/2008   ESOPHAGITIS 01/17/2008   DIVERTICULOSIS, COLON 01/17/2008   HEMORRHOIDS-EXTERNAL 12/07/2007   GERD 12/07/2007   Constipation 12/07/2007   RECTAL BLEEDING 12/07/2007   Loss of weight 12/07/2007   Nausea alone 12/07/2007   HEARTBURN 12/07/2007   ABDOMINAL BLOATING 12/07/2007   History of colonic polyps 12/07/2007    Orientation RESPIRATION BLADDER Height & Weight     Self  Normal Incontinent Weight: 128 lb (58.1 kg) Height:  5' 4 (162.6 cm)  BEHAVIORAL SYMPTOMS/MOOD NEUROLOGICAL BOWEL NUTRITION STATUS      Incontinent Diet (Dysphagia 3 diet)  AMBULATORY STATUS COMMUNICATION OF NEEDS Skin   Extensive Assist Verbally Other (Comment) (Erythema: buttocks, sacrum; Ecchymosis: buttocks)                       Personal Care Assistance Level of Assistance  Bathing, Feeding, Dressing Bathing Assistance: Maximum assistance Feeding assistance: Limited assistance Dressing Assistance: Maximum assistance     Functional Limitations Info  Sight, Hearing, Speech Sight Info:  Impaired Hearing Info: Adequate Speech Info: Adequate    SPECIAL CARE FACTORS FREQUENCY                       Contractures Contractures Info: Not present    Additional Factors Info  Code Status, Allergies Code  Status Info: DNR Allergies Info: Ambien  (Zolpidem  Tartrate); Celexa (Citalopram); Cymbalta (Duloxetine Hcl); Ditropan Xl (Oxybutynin Chloride); Lescol (Fluvastatin); Lipitor (Pregabalin ); Neurontin (Gabapentin); Pravastatin; Tape; Zetia (Ezetimibe); Zocor (Simvastatin)           Current Medications (11/29/2023):  This is the current hospital active medication list Current Facility-Administered Medications  Medication Dose Route Frequency Provider Last Rate Last Admin   acetaminophen  (TYLENOL ) tablet 650 mg  650 mg Oral Q6H PRN Doutova, Anastassia, MD       Or   acetaminophen  (TYLENOL ) suppository 650 mg  650 mg Rectal Q6H PRN Doutova, Anastassia, MD       albuterol  (PROVENTIL ) (2.5 MG/3ML) 0.083% nebulizer solution 2.5 mg  2.5 mg Nebulization Q2H PRN Doutova, Anastassia, MD       amoxicillin  (AMOXIL ) capsule 500 mg  500 mg Oral TID Rizwan, Saima, MD   500 mg at 11/29/23 0900   Chlorhexidine  Gluconate Cloth 2 % PADS 6 each  6 each Topical Daily Rizwan, Saima, MD   6 each at 11/29/23 0900   diltiazem  (CARDIZEM  CD) 24 hr capsule 120 mg  120 mg Oral Daily Rizwan, Saima, MD   120 mg at 11/29/23 0900   divalproex  (DEPAKOTE  SPRINKLE) capsule 125 mg  125 mg Oral BID PRN Doutova, Anastassia, MD       donepezil  (ARICEPT ) tablet 10 mg  10 mg Oral QPM Doutova, Anastassia, MD   10 mg at 11/28/23 1703   LORazepam  (ATIVAN ) tablet 0.5-1 mg  0.5-1 mg Oral BID PRN Doutova, Anastassia, MD   0.5 mg at 11/29/23 0801   memantine  (NAMENDA ) tablet 5 mg  5 mg Oral Daily Doutova, Anastassia, MD   5 mg at 11/29/23 0900   ondansetron  (ZOFRAN ) tablet 4 mg  4 mg Oral Q6H PRN Doutova, Anastassia, MD       Or   ondansetron  (ZOFRAN ) injection 4 mg  4 mg Intravenous Q6H PRN Doutova, Anastassia, MD       pantoprazole  (PROTONIX ) EC tablet 40 mg  40 mg Oral Daily Doutova, Anastassia, MD   40 mg at 11/29/23 0900   pentosan polysulfate (ELMIRON ) capsule 100 mg  100 mg Oral Daily Doutova, Anastassia, MD   100 mg at 11/28/23 1026    polyethylene glycol (MIRALAX  / GLYCOLAX ) packet 17 g  17 g Oral Daily Doutova, Anastassia, MD   17 g at 11/29/23 0900   QUEtiapine  (SEROQUEL ) tablet 25 mg  25 mg Oral QHS Doutova, Anastassia, MD   25 mg at 11/28/23 2152   Rivaroxaban  (XARELTO ) tablet 15 mg  15 mg Oral QPM Doutova, Anastassia, MD   15 mg at 11/28/23 1703   traMADol  (ULTRAM ) tablet 50 mg  50 mg Oral TID PRN Doutova, Anastassia, MD   50 mg at 11/29/23 0802    Discharge Medications: Please see discharge summary for a list of discharge medications.  TAKE these medications     acetaminophen  325 MG tablet Commonly known as: TYLENOL  Take 2 tablets (650 mg total) by mouth every 6 (six) hours as needed for mild pain (pain score 1-3) (or Fever >/= 101).    amoxicillin  500 MG capsule Commonly known as: AMOXIL  Take 1 capsule (500 mg total) by mouth  3 (three) times daily for 6 days.    diltiazem  120 MG 24 hr capsule Commonly known as: CARDIZEM  CD Take 1 capsule (120 mg total) by mouth daily.    divalproex  125 MG capsule Commonly known as: DEPAKOTE  SPRINKLE Take 125 mg by mouth 2 (two) times daily as needed (agitation).    donepezil  10 MG tablet Commonly known as: ARICEPT  Take 10 mg by mouth every evening.    hydrOXYzine  25 MG tablet Commonly known as: ATARAX  Take 1 tablet (25 mg total) by mouth at bedtime as needed. What changed: when to take this    LORazepam  0.5 MG tablet Commonly known as: ATIVAN  Take 1-2 tablets (0.5-1 mg total) by mouth 2 (two) times daily as needed for anxiety. What changed:  medication strength how much to take when to take this reasons to take this    memantine  5 MG tablet Commonly known as: NAMENDA  Take 5 mg by mouth daily.    pantoprazole  40 MG tablet Commonly known as: PROTONIX  Take 40 mg by mouth daily.    pentosan polysulfate 100 MG capsule Commonly known as: ELMIRON  Take 100 mg by mouth daily.    polyethylene glycol 17 g packet Commonly known as: MIRALAX  / GLYCOLAX  Take  17 g by mouth daily.    QUEtiapine  25 MG tablet Commonly known as: SEROQUEL  Take 25 mg by mouth at bedtime.    Rivaroxaban  15 MG Tabs tablet Commonly known as: XARELTO  Take 1 tablet (15 mg total) by mouth daily with supper. What changed: when to take this    traMADol  50 MG tablet Commonly known as: ULTRAM  Take 50 mg by mouth 3 (three) times daily as needed for moderate pain (pain score 4-6) or severe pain (pain score 7-10).    Vitamin D3 25 MCG (1000 UT) Caps Take 1,000 Units by mouth daily.   Relevant Imaging Results:  Relevant Lab Results:   Additional Information SSN: [id number]  Duwaine GORMAN Aran, LCSW

## 2023-11-29 NOTE — Progress Notes (Signed)
 Discharge instructions were reviewed with the patients daughter, [name]. She denied questions/concerns at this time. No changes from am assessment. Sacral dressing is clean, dry and intact.

## 2023-11-29 NOTE — Progress Notes (Addendum)
 [facility] Progress Note  Patient: Christine [name]     FMW:[id number]  DOA: 11/24/2023   PCP: Shayne Anes, MD       Brief hospital course: This is an 89 year old female with dementia, coronary artery disease, atrial fibrillation, recurrent UTIs who presents to the hospital for increasing confusion and lethargy from a skilled nursing facility.  She was hospitalized from 11/2 through 11/10 after a fall and treated with 5 days of IV ceftriaxone  for a suspected UTI although, the urine culture grew multiple species. In the ED: UA revealed large WBCs and rare bacteria.   CT abdomen pelvis revealed bilateral hydronephrosis and hydroureter distended to the level of urinary bladder. A Foley catheter was placed.  Please see dc summary from 11/23. She is stable to dc today.     Subjective:  Sitting up in a chair.  Remains confused.  Daughter at bedside.  Assessment and Plan: Principal Problem:  Acute urinary retention with bilateral hydronephrosis UTI H/o interstitial cystitis  - Urine culture reveals greater than 100,000 Enterococcus faecalis - She has been on ceftriaxone -after urine culture results, switch to amoxicillin  on 11/22-recommend 7 days for complicated UTI - We did remove her Foley catheter to give her attempts to void however she retained greater than 500 cc and could not void and therefore the Foley had to be replaced - Continue to hold mirabegron -discussed with her daughter that the patient will need a close follow-up with her primary urologist  Active Problems: Community-acquired pneumonia?  - Left basilar opacity noted on chest x-ray - Have spoken with daughte who does not recall that the patient had cough or shortness of breath prior to coming in-DC azithromycin  on 11/21 as she likely did not have pneumonia  CKD stage III AA  Anemia of chronic disease    Recurrent falls - Will return to ALF     Acute encephalopathy - With underlying dementia  Abnormal  thyroid  functions - TSH 8.810 - Free T41.16 - Will need to recheck when the patient is not acutely ill-follow-up for signs of hyperthyroidism      Code Status: Limited: Do not attempt resuscitation (DNR) -DNR-LIMITED -Do Not Intubate/DNI  Total time on patient care: 35 minutes DVT prophylaxis:  SCDs Start: 11/24/23 2210 Rivaroxaban  (XARELTO ) tablet 15 mg     Objective:   Vitals:   11/28/23 1025 11/28/23 2018 11/29/23 0555 11/29/23 0806  BP: 113/64 (!) 105/50 (!) 125/47 128/61  Pulse: 69 73 70 70  Resp: 16 16 15 16   Temp: 98.3 F (36.8 C) 98.8 F (37.1 C) 98 F (36.7 C) 98.1 F (36.7 C)  TempSrc: Oral Oral  Oral  SpO2: 100% 99% 98% 97%  Weight:      Height:       Filed Weights   11/25/23 1508  Weight: 58.1 kg   Exam: General exam: Appears comfortable-very confused HEENT: oral mucosa moist Respiratory system: Clear to auscultation.  Cardiovascular system: S1 & S2 heard  Gastrointestinal system: Abdomen soft, non-tender, nondistended. Normal bowel sounds   Extremities: No cyanosis, clubbing or edema   CBC: Recent Labs  Lab 11/24/23 1421 11/25/23 0500  WBC 9.2 6.3  NEUTROABS 7.1  --   HGB 10.4* 8.4*  HCT 32.4* 26.4*  MCV 89.8 91.3  PLT 351 281   Basic Metabolic Panel: Recent Labs  Lab 11/24/23 1421 11/24/23 2122 11/24/23 2123 11/25/23 0500 11/25/23 1550  NA 141 136  --  137 138  K 5.0 4.7  --  4.3 4.4  CL 105 103  --  104 106  CO2 24 23  --  22 22  GLUCOSE 112* 117*  --  153* 101*  BUN 23 21  --  19 20  CREATININE 1.22* 1.08*  --  1.02* 0.95  CALCIUM  9.4 9.0  --  8.9 8.7*  MG  --   --  2.1 2.1  --   PHOS  --   --  4.1 4.0  --      Scheduled Meds:  amoxicillin   500 mg Oral TID   Chlorhexidine  Gluconate Cloth  6 each Topical Daily   diltiazem   120 mg Oral Daily   donepezil   10 mg Oral QPM   memantine   5 mg Oral Daily   pantoprazole   40 mg Oral Daily   pentosan polysulfate  100 mg Oral Daily   polyethylene glycol  17 g Oral Daily    QUEtiapine   25 mg Oral QHS   Rivaroxaban   15 mg Oral QPM    Imaging and lab data personally reviewed   Author: Logyn Dedominicis  11/29/2023 12:06 PM  To contact [facility]>   Check the care team in [facility] and look for the attending/consulting [facility] provider listed  Log into [email or web address] and use [place]'s universal password   Go to> [facility]  and find provider  If you still have difficulty reaching the provider, please page the [facility] (Director on Call) for the Hospitalists listed on amion

## 2023-12-01 DIAGNOSIS — M4317 Spondylolisthesis, lumbosacral region: Secondary | ICD-10-CM | POA: Diagnosis not present

## 2023-12-01 DIAGNOSIS — R279 Unspecified lack of coordination: Secondary | ICD-10-CM | POA: Diagnosis not present

## 2023-12-01 DIAGNOSIS — I088 Other rheumatic multiple valve diseases: Secondary | ICD-10-CM | POA: Diagnosis not present

## 2023-12-01 DIAGNOSIS — N136 Pyonephrosis: Secondary | ICD-10-CM | POA: Diagnosis not present

## 2023-12-01 DIAGNOSIS — G9340 Encephalopathy, unspecified: Secondary | ICD-10-CM | POA: Diagnosis not present

## 2023-12-01 DIAGNOSIS — K5730 Diverticulosis of large intestine without perforation or abscess without bleeding: Secondary | ICD-10-CM | POA: Diagnosis not present

## 2023-12-01 DIAGNOSIS — J189 Pneumonia, unspecified organism: Secondary | ICD-10-CM | POA: Diagnosis not present

## 2023-12-01 DIAGNOSIS — J9811 Atelectasis: Secondary | ICD-10-CM | POA: Diagnosis not present

## 2023-12-01 DIAGNOSIS — Z7901 Long term (current) use of anticoagulants: Secondary | ICD-10-CM | POA: Diagnosis not present

## 2023-12-01 DIAGNOSIS — N183 Chronic kidney disease, stage 3 unspecified: Secondary | ICD-10-CM | POA: Diagnosis not present

## 2023-12-01 DIAGNOSIS — I480 Paroxysmal atrial fibrillation: Secondary | ICD-10-CM | POA: Diagnosis not present

## 2023-12-01 DIAGNOSIS — F0393 Unspecified dementia, unspecified severity, with mood disturbance: Secondary | ICD-10-CM | POA: Diagnosis not present

## 2023-12-01 DIAGNOSIS — E559 Vitamin D deficiency, unspecified: Secondary | ICD-10-CM | POA: Diagnosis not present

## 2023-12-01 DIAGNOSIS — E611 Iron deficiency: Secondary | ICD-10-CM | POA: Diagnosis not present

## 2023-12-01 DIAGNOSIS — F02811 Dementia in other diseases classified elsewhere, unspecified severity, with agitation: Secondary | ICD-10-CM | POA: Diagnosis not present

## 2023-12-01 DIAGNOSIS — I517 Cardiomegaly: Secondary | ICD-10-CM | POA: Diagnosis not present

## 2023-12-01 DIAGNOSIS — E785 Hyperlipidemia, unspecified: Secondary | ICD-10-CM | POA: Diagnosis not present

## 2023-12-01 DIAGNOSIS — I129 Hypertensive chronic kidney disease with stage 1 through stage 4 chronic kidney disease, or unspecified chronic kidney disease: Secondary | ICD-10-CM | POA: Diagnosis not present

## 2023-12-01 DIAGNOSIS — Z466 Encounter for fitting and adjustment of urinary device: Secondary | ICD-10-CM | POA: Diagnosis not present

## 2023-12-01 DIAGNOSIS — D631 Anemia in chronic kidney disease: Secondary | ICD-10-CM | POA: Diagnosis not present

## 2023-12-01 DIAGNOSIS — I700 Atherosclerosis of aorta: Secondary | ICD-10-CM | POA: Diagnosis not present

## 2023-12-01 DIAGNOSIS — L89152 Pressure ulcer of sacral region, stage 2: Secondary | ICD-10-CM | POA: Diagnosis not present

## 2023-12-01 DIAGNOSIS — I2510 Atherosclerotic heart disease of native coronary artery without angina pectoris: Secondary | ICD-10-CM | POA: Diagnosis not present

## 2023-12-01 DIAGNOSIS — B952 Enterococcus as the cause of diseases classified elsewhere: Secondary | ICD-10-CM | POA: Diagnosis not present

## 2023-12-01 DIAGNOSIS — N3000 Acute cystitis without hematuria: Secondary | ICD-10-CM | POA: Diagnosis not present

## 2023-12-01 DIAGNOSIS — K219 Gastro-esophageal reflux disease without esophagitis: Secondary | ICD-10-CM | POA: Diagnosis not present

## 2023-12-01 DIAGNOSIS — R1310 Dysphagia, unspecified: Secondary | ICD-10-CM | POA: Diagnosis not present

## 2023-12-05 DIAGNOSIS — F02818 Dementia in other diseases classified elsewhere, unspecified severity, with other behavioral disturbance: Secondary | ICD-10-CM | POA: Diagnosis not present

## 2023-12-05 DIAGNOSIS — N390 Urinary tract infection, site not specified: Secondary | ICD-10-CM | POA: Diagnosis not present

## 2023-12-06 DIAGNOSIS — R1310 Dysphagia, unspecified: Secondary | ICD-10-CM | POA: Diagnosis not present

## 2023-12-06 DIAGNOSIS — R279 Unspecified lack of coordination: Secondary | ICD-10-CM | POA: Diagnosis not present

## 2023-12-06 DIAGNOSIS — F02811 Dementia in other diseases classified elsewhere, unspecified severity, with agitation: Secondary | ICD-10-CM | POA: Diagnosis not present

## 2023-12-07 ENCOUNTER — Encounter (HOSPITAL_COMMUNITY): Payer: Self-pay | Admitting: Family Medicine

## 2023-12-07 ENCOUNTER — Emergency Department (HOSPITAL_COMMUNITY)

## 2023-12-07 ENCOUNTER — Observation Stay (HOSPITAL_COMMUNITY)
Admission: EM | Admit: 2023-12-07 | Discharge: 2023-12-17 | Disposition: A | Discharge status: Hospice | Attending: Internal Medicine | Admitting: Internal Medicine

## 2023-12-07 DIAGNOSIS — R609 Edema, unspecified: Secondary | ICD-10-CM | POA: Diagnosis not present

## 2023-12-07 DIAGNOSIS — N3000 Acute cystitis without hematuria: Secondary | ICD-10-CM | POA: Diagnosis not present

## 2023-12-07 DIAGNOSIS — S3991XA Unspecified injury of abdomen, initial encounter: Secondary | ICD-10-CM | POA: Diagnosis not present

## 2023-12-07 DIAGNOSIS — N390 Urinary tract infection, site not specified: Secondary | ICD-10-CM | POA: Diagnosis not present

## 2023-12-07 DIAGNOSIS — Z79899 Other long term (current) drug therapy: Secondary | ICD-10-CM | POA: Insufficient documentation

## 2023-12-07 DIAGNOSIS — D631 Anemia in chronic kidney disease: Secondary | ICD-10-CM

## 2023-12-07 DIAGNOSIS — I959 Hypotension, unspecified: Secondary | ICD-10-CM | POA: Diagnosis not present

## 2023-12-07 DIAGNOSIS — N183 Chronic kidney disease, stage 3 unspecified: Secondary | ICD-10-CM | POA: Diagnosis not present

## 2023-12-07 DIAGNOSIS — D649 Anemia, unspecified: Secondary | ICD-10-CM | POA: Insufficient documentation

## 2023-12-07 DIAGNOSIS — I129 Hypertensive chronic kidney disease with stage 1 through stage 4 chronic kidney disease, or unspecified chronic kidney disease: Secondary | ICD-10-CM | POA: Diagnosis not present

## 2023-12-07 DIAGNOSIS — N1831 Chronic kidney disease, stage 3a: Secondary | ICD-10-CM | POA: Diagnosis not present

## 2023-12-07 DIAGNOSIS — R4702 Dysphasia: Secondary | ICD-10-CM | POA: Diagnosis not present

## 2023-12-07 DIAGNOSIS — R1310 Dysphagia, unspecified: Secondary | ICD-10-CM | POA: Diagnosis not present

## 2023-12-07 DIAGNOSIS — Z96642 Presence of left artificial hip joint: Secondary | ICD-10-CM | POA: Diagnosis not present

## 2023-12-07 DIAGNOSIS — F0390 Unspecified dementia without behavioral disturbance: Secondary | ICD-10-CM | POA: Insufficient documentation

## 2023-12-07 DIAGNOSIS — M79603 Pain in arm, unspecified: Secondary | ICD-10-CM | POA: Diagnosis not present

## 2023-12-07 DIAGNOSIS — R9431 Abnormal electrocardiogram [ECG] [EKG]: Secondary | ICD-10-CM | POA: Diagnosis present

## 2023-12-07 DIAGNOSIS — L89152 Pressure ulcer of sacral region, stage 2: Secondary | ICD-10-CM | POA: Diagnosis not present

## 2023-12-07 DIAGNOSIS — R279 Unspecified lack of coordination: Secondary | ICD-10-CM | POA: Diagnosis not present

## 2023-12-07 DIAGNOSIS — B999 Unspecified infectious disease: Principal | ICD-10-CM

## 2023-12-07 DIAGNOSIS — I10 Essential (primary) hypertension: Secondary | ICD-10-CM | POA: Insufficient documentation

## 2023-12-07 DIAGNOSIS — I088 Other rheumatic multiple valve diseases: Secondary | ICD-10-CM | POA: Diagnosis not present

## 2023-12-07 DIAGNOSIS — Z7982 Long term (current) use of aspirin: Secondary | ICD-10-CM | POA: Insufficient documentation

## 2023-12-07 DIAGNOSIS — R531 Weakness: Secondary | ICD-10-CM | POA: Diagnosis not present

## 2023-12-07 DIAGNOSIS — G319 Degenerative disease of nervous system, unspecified: Secondary | ICD-10-CM | POA: Diagnosis not present

## 2023-12-07 DIAGNOSIS — R911 Solitary pulmonary nodule: Secondary | ICD-10-CM | POA: Diagnosis present

## 2023-12-07 DIAGNOSIS — I700 Atherosclerosis of aorta: Secondary | ICD-10-CM | POA: Diagnosis not present

## 2023-12-07 DIAGNOSIS — F02811 Dementia in other diseases classified elsewhere, unspecified severity, with agitation: Secondary | ICD-10-CM | POA: Diagnosis not present

## 2023-12-07 DIAGNOSIS — F0393 Unspecified dementia, unspecified severity, with mood disturbance: Secondary | ICD-10-CM | POA: Diagnosis not present

## 2023-12-07 DIAGNOSIS — M19072 Primary osteoarthritis, left ankle and foot: Secondary | ICD-10-CM | POA: Diagnosis not present

## 2023-12-07 DIAGNOSIS — I480 Paroxysmal atrial fibrillation: Secondary | ICD-10-CM | POA: Diagnosis not present

## 2023-12-07 DIAGNOSIS — R338 Other retention of urine: Secondary | ICD-10-CM | POA: Diagnosis not present

## 2023-12-07 DIAGNOSIS — M2012 Hallux valgus (acquired), left foot: Secondary | ICD-10-CM | POA: Diagnosis not present

## 2023-12-07 DIAGNOSIS — R4182 Altered mental status, unspecified: Secondary | ICD-10-CM | POA: Diagnosis not present

## 2023-12-07 DIAGNOSIS — S299XXA Unspecified injury of thorax, initial encounter: Secondary | ICD-10-CM | POA: Diagnosis not present

## 2023-12-07 DIAGNOSIS — Z043 Encounter for examination and observation following other accident: Secondary | ICD-10-CM | POA: Diagnosis not present

## 2023-12-07 DIAGNOSIS — M25511 Pain in right shoulder: Secondary | ICD-10-CM | POA: Diagnosis not present

## 2023-12-07 DIAGNOSIS — N136 Pyonephrosis: Secondary | ICD-10-CM | POA: Diagnosis not present

## 2023-12-07 DIAGNOSIS — G9341 Metabolic encephalopathy: Secondary | ICD-10-CM | POA: Diagnosis not present

## 2023-12-07 DIAGNOSIS — B952 Enterococcus as the cause of diseases classified elsewhere: Secondary | ICD-10-CM | POA: Diagnosis not present

## 2023-12-07 DIAGNOSIS — R918 Other nonspecific abnormal finding of lung field: Secondary | ICD-10-CM | POA: Diagnosis not present

## 2023-12-07 DIAGNOSIS — M19041 Primary osteoarthritis, right hand: Secondary | ICD-10-CM | POA: Diagnosis not present

## 2023-12-07 DIAGNOSIS — K5730 Diverticulosis of large intestine without perforation or abscess without bleeding: Secondary | ICD-10-CM | POA: Diagnosis not present

## 2023-12-07 DIAGNOSIS — S3993XA Unspecified injury of pelvis, initial encounter: Secondary | ICD-10-CM | POA: Diagnosis not present

## 2023-12-07 DIAGNOSIS — M79641 Pain in right hand: Secondary | ICD-10-CM | POA: Diagnosis not present

## 2023-12-07 DIAGNOSIS — J90 Pleural effusion, not elsewhere classified: Secondary | ICD-10-CM | POA: Diagnosis not present

## 2023-12-07 DIAGNOSIS — F02818 Dementia in other diseases classified elsewhere, unspecified severity, with other behavioral disturbance: Secondary | ICD-10-CM | POA: Diagnosis not present

## 2023-12-07 DIAGNOSIS — G9340 Encephalopathy, unspecified: Secondary | ICD-10-CM | POA: Diagnosis present

## 2023-12-07 DIAGNOSIS — I2510 Atherosclerotic heart disease of native coronary artery without angina pectoris: Secondary | ICD-10-CM | POA: Diagnosis not present

## 2023-12-07 DIAGNOSIS — G9349 Other encephalopathy: Principal | ICD-10-CM

## 2023-12-07 DIAGNOSIS — M79601 Pain in right arm: Secondary | ICD-10-CM | POA: Diagnosis present

## 2023-12-07 DIAGNOSIS — R339 Retention of urine, unspecified: Secondary | ICD-10-CM | POA: Insufficient documentation

## 2023-12-07 DIAGNOSIS — Z466 Encounter for fitting and adjustment of urinary device: Secondary | ICD-10-CM | POA: Diagnosis not present

## 2023-12-07 LAB — URINALYSIS, W/ REFLEX TO CULTURE (INFECTION SUSPECTED)
Bilirubin Urine: NEGATIVE
Glucose, UA: NEGATIVE mg/dL
Hgb urine dipstick: NEGATIVE
Ketones, ur: 20 mg/dL — AB
Leukocytes,Ua: NEGATIVE
Nitrite: NEGATIVE
Protein, ur: 30 mg/dL — AB
Specific Gravity, Urine: 1.028 (ref 1.005–1.030)
pH: 8 (ref 5.0–8.0)

## 2023-12-07 LAB — CBC WITH DIFFERENTIAL/PLATELET
Abs Immature Granulocytes: 0.02 K/uL (ref 0.00–0.07)
Basophils Absolute: 0 K/uL (ref 0.0–0.1)
Basophils Relative: 0 %
Eosinophils Absolute: 0 K/uL (ref 0.0–0.5)
Eosinophils Relative: 0 %
HCT: 30.7 % — ABNORMAL LOW (ref 36.0–46.0)
Hemoglobin: 9.9 g/dL — ABNORMAL LOW (ref 12.0–15.0)
Immature Granulocytes: 0 %
Lymphocytes Relative: 12 %
Lymphs Abs: 1 K/uL (ref 0.7–4.0)
MCH: 28 pg (ref 26.0–34.0)
MCHC: 32.2 g/dL (ref 30.0–36.0)
MCV: 86.7 fL (ref 80.0–100.0)
Monocytes Absolute: 1.2 K/uL — ABNORMAL HIGH (ref 0.1–1.0)
Monocytes Relative: 15 %
Neutro Abs: 6 K/uL (ref 1.7–7.7)
Neutrophils Relative %: 73 %
Platelets: 278 K/uL (ref 150–400)
RBC: 3.54 MIL/uL — ABNORMAL LOW (ref 3.87–5.11)
RDW: 14.6 % (ref 11.5–15.5)
WBC: 8.2 K/uL (ref 4.0–10.5)
nRBC: 0 % (ref 0.0–0.2)

## 2023-12-07 LAB — RESP PANEL BY RT-PCR (RSV, FLU A&B, COVID)  RVPGX2
Influenza A by PCR: NEGATIVE
Influenza B by PCR: NEGATIVE
Resp Syncytial Virus by PCR: NEGATIVE
SARS Coronavirus 2 by RT PCR: NEGATIVE

## 2023-12-07 LAB — ETHANOL: Alcohol, Ethyl (B): <15 mg/dL (ref <15)

## 2023-12-07 LAB — I-STAT CHEM 8, ED
BUN: 24 mg/dL — ABNORMAL HIGH (ref 8–23)
Calcium, Ion: 1.13 mmol/L — ABNORMAL LOW (ref 1.15–1.40)
Chloride: 104 mmol/L (ref 98–111)
Creatinine, Ser: 0.8 mg/dL (ref 0.44–1.00)
Glucose, Bld: 100 mg/dL — ABNORMAL HIGH (ref 70–99)
HCT: 29 % — ABNORMAL LOW (ref 36.0–46.0)
Hemoglobin: 9.9 g/dL — ABNORMAL LOW (ref 12.0–15.0)
Potassium: 3.9 mmol/L (ref 3.5–5.1)
Sodium: 140 mmol/L (ref 135–145)
TCO2: 22 mmol/L (ref 22–32)

## 2023-12-07 LAB — PHENYTOIN LEVEL, TOTAL: Phenytoin Lvl: <2.5 ug/mL — ABNORMAL LOW (ref 10.0–20.0)

## 2023-12-07 LAB — COMPREHENSIVE METABOLIC PANEL WITH GFR
ALT: 12 U/L (ref 0–44)
AST: 22 U/L (ref 15–41)
Albumin: 2.7 g/dL — ABNORMAL LOW (ref 3.5–5.0)
Alkaline Phosphatase: 64 U/L (ref 38–126)
Anion gap: 12 (ref 5–15)
BUN: 22 mg/dL (ref 8–23)
CO2: 23 mmol/L (ref 22–32)
Calcium: 8.9 mg/dL (ref 8.9–10.3)
Chloride: 103 mmol/L (ref 98–111)
Creatinine, Ser: 0.85 mg/dL (ref 0.44–1.00)
GFR, Estimated: >60 mL/min (ref >60)
Glucose, Bld: 98 mg/dL (ref 70–99)
Potassium: 3.8 mmol/L (ref 3.5–5.1)
Sodium: 138 mmol/L (ref 135–145)
Total Bilirubin: 0.8 mg/dL (ref 0.0–1.2)
Total Protein: 6.9 g/dL (ref 6.5–8.1)

## 2023-12-07 LAB — RAPID URINE DRUG SCREEN, HOSP PERFORMED
Amphetamines: NOT DETECTED
Barbiturates: NOT DETECTED
Benzodiazepines: POSITIVE — AB
Cocaine: NOT DETECTED
Opiates: NOT DETECTED
Tetrahydrocannabinol: NOT DETECTED

## 2023-12-07 LAB — I-STAT CG4 LACTIC ACID, ED
Lactic Acid, Venous: 2.2 mmol/L (ref 0.5–1.9)
Lactic Acid, Venous: 0.9 mmol/L (ref 0.5–1.9)

## 2023-12-07 LAB — TYPE AND SCREEN
ABO/RH(D): A POS
Antibody Screen: NEGATIVE

## 2023-12-07 LAB — TROPONIN I (HIGH SENSITIVITY): Troponin I (High Sensitivity): 12 ng/L (ref <18)

## 2023-12-07 MED ORDER — SODIUM CHLORIDE 0.9 % IV BOLUS
1000.0000 mL | Freq: Once | INTRAVENOUS | Status: AC
  Administered 2023-12-07: 1000 mL via INTRAVENOUS

## 2023-12-07 MED ORDER — PANTOPRAZOLE SODIUM 40 MG PO TBEC
40.0000 mg | DELAYED_RELEASE_TABLET | Freq: Every day | ORAL | Status: DC
  Administered 2023-12-08 – 2023-12-17 (×10): 40 mg via ORAL
  Filled 2023-12-07 (×10): qty 1

## 2023-12-07 MED ORDER — RIVAROXABAN 15 MG PO TABS
15.0000 mg | ORAL_TABLET | Freq: Every evening | ORAL | Status: DC
  Administered 2023-12-08 – 2023-12-16 (×9): 15 mg via ORAL
  Filled 2023-12-07 (×10): qty 1

## 2023-12-07 MED ORDER — MEMANTINE HCL 10 MG PO TABS
5.0000 mg | ORAL_TABLET | Freq: Every day | ORAL | Status: DC
  Administered 2023-12-08 – 2023-12-17 (×10): 5 mg via ORAL
  Filled 2023-12-07 (×10): qty 1

## 2023-12-07 MED ORDER — QUETIAPINE FUMARATE 25 MG PO TABS
25.0000 mg | ORAL_TABLET | Freq: Every day | ORAL | Status: DC
  Administered 2023-12-08 – 2023-12-15 (×8): 25 mg via ORAL
  Filled 2023-12-07 (×8): qty 1

## 2023-12-07 MED ORDER — FENTANYL CITRATE (PF) 50 MCG/ML IJ SOSY: 12.5000 ug | PREFILLED_SYRINGE | INTRAMUSCULAR | Status: DC | PRN

## 2023-12-07 MED ORDER — POTASSIUM CHLORIDE CRYS ER 10 MEQ PO TBCR
10.0000 meq | EXTENDED_RELEASE_TABLET | Freq: Once | ORAL | Status: DC
  Filled 2023-12-07: qty 1

## 2023-12-07 MED ORDER — TRIMETHOBENZAMIDE HCL 100 MG/ML IM SOLN: 200.0000 mg | Freq: Four times a day (QID) | INTRAMUSCULAR | Status: DC | PRN

## 2023-12-07 MED ORDER — LORAZEPAM 2 MG/ML IJ SOLN
1.0000 mg | Freq: Once | INTRAMUSCULAR | Status: AC
  Administered 2023-12-07: 1 mg via INTRAVENOUS
  Filled 2023-12-07: qty 1

## 2023-12-07 MED ORDER — ACETAMINOPHEN 500 MG PO TABS
500.0000 mg | ORAL_TABLET | Freq: Four times a day (QID) | ORAL | Status: DC
  Administered 2023-12-08 – 2023-12-17 (×26): 500 mg via ORAL
  Filled 2023-12-07 (×26): qty 1

## 2023-12-07 MED ORDER — DIVALPROEX SODIUM 125 MG PO CSDR: 125.0000 mg | DELAYED_RELEASE_CAPSULE | Freq: Two times a day (BID) | ORAL | Status: AC | PRN

## 2023-12-07 MED ORDER — DILTIAZEM HCL ER COATED BEADS 120 MG PO CP24
120.0000 mg | ORAL_CAPSULE | Freq: Every day | ORAL | Status: DC
  Administered 2023-12-08 – 2023-12-17 (×10): 120 mg via ORAL
  Filled 2023-12-07 (×10): qty 1

## 2023-12-07 MED ORDER — SODIUM CHLORIDE 0.9% FLUSH
3.0000 mL | Freq: Two times a day (BID) | INTRAVENOUS | Status: DC
  Administered 2023-12-07 – 2023-12-17 (×19): 3 mL via INTRAVENOUS

## 2023-12-07 MED ORDER — SODIUM CHLORIDE 0.9 % IV SOLN
500.0000 mg | Freq: Once | INTRAVENOUS | Status: AC
  Administered 2023-12-07: 500 mg via INTRAVENOUS
  Filled 2023-12-07: qty 5

## 2023-12-07 MED ORDER — TRAMADOL HCL 50 MG PO TABS
50.0000 mg | ORAL_TABLET | Freq: Three times a day (TID) | ORAL | Status: DC | PRN
  Administered 2023-12-14 – 2023-12-15 (×2): 50 mg via ORAL
  Filled 2023-12-07 (×3): qty 1

## 2023-12-07 MED ORDER — FENTANYL CITRATE (PF) 50 MCG/ML IJ SOSY
12.5000 ug | PREFILLED_SYRINGE | Freq: Once | INTRAMUSCULAR | Status: AC
  Administered 2023-12-07: 12.5 ug via INTRAVENOUS
  Filled 2023-12-07: qty 1

## 2023-12-07 MED ORDER — LORAZEPAM 0.5 MG PO TABS
0.5000 mg | ORAL_TABLET | Freq: Two times a day (BID) | ORAL | Status: DC | PRN
  Administered 2023-12-08 – 2023-12-11 (×3): 1 mg via ORAL
  Administered 2023-12-13: 0.5 mg via ORAL
  Administered 2023-12-14 – 2023-12-15 (×2): 1 mg via ORAL
  Filled 2023-12-07 (×2): qty 2
  Filled 2023-12-07: qty 1
  Filled 2023-12-07 (×3): qty 2

## 2023-12-07 MED ORDER — BISACODYL 5 MG PO TBEC: 5.0000 mg | DELAYED_RELEASE_TABLET | Freq: Every day | ORAL | Status: DC | PRN

## 2023-12-07 MED ORDER — POLYETHYLENE GLYCOL 3350 17 G PO PACK: 17.0000 g | PACK | Freq: Every day | ORAL | Status: DC | PRN

## 2023-12-07 MED ORDER — IOHEXOL 350 MG/ML SOLN
75.0000 mL | Freq: Once | INTRAVENOUS | Status: AC | PRN
  Administered 2023-12-07: 75 mL via INTRAVENOUS

## 2023-12-07 NOTE — ED Notes (Signed)
 Pt otf to scan, care will resume on return.

## 2023-12-07 NOTE — ED Notes (Signed)
 Pt is demented at baseline and possibly ams, unable or unwilling to answer triage questions.

## 2023-12-07 NOTE — H&P (Signed)
 History and Physical    Christine [name] FMW:[id number] DOB: July 18, 1933 DOA: 12/07/2023  PCP: Shayne Anes, MD   Patient coming from: ALF   Chief Complaint: Fall, right arm pain, increased confusion   HPI: Christine [name] is an 89 y.o. adult with medical history significant for dementia with behavioral disturbance, coronary artery disease, atrial fibrillation on Xarelto , recurrent UTIs, recurrent falls, and recent admission with urinary retention and bilateral hydronephrosis who now returns with inability to ambulate and increased confusion after another fall.  Patient was recent hospitalized, found to have urinary retention with bilateral hydronephrosis.  She had Foley catheter placed, failed a voiding trial, and was discharged with Foley.  PT had recommended that she be discharged to an SNF but she returned to her ALF at the daughter's request.  She fell again shortly after discharge, has been calling out in pain, and has been requiring two-person assist to ambulate now.  She is also reported to be more confused than usual.  ED Course: Upon arrival to the ED, patient is found to have Tmax of 37.9 C rectally with normal RR, normal HR, and stable BP.  Labs are notable for normal creatinine, albumin 2.7, normal WBC, lactic acid 2.2, and negative respiratory virus panel.  There is no acute findings on head CT.  CT of the chest, abdomen, and pelvis is notable for a nodule at the left apex, improved hydronephrosis, but no pneumonia or fractures.  Plain radiographs of the right shoulder, right elbow, right hand, bilateral feet are negative for acute fracture or dislocation.  Blood cultures were collected in the ED and the patient was given a liter of NS, IV Ativan , fentanyl , and azithromycin .  Review of Systems:  All other systems reviewed and apart from HPI, are negative.  Past Medical History:  Diagnosis Date   AR (allergic rhinitis)    Arthritis    Bilateral carotid artery stenosis    MILD --   40% BILATERAL ICA PER DOPPLER JULY 2014   Diverticulosis    Frequency of urination    GERD (gastroesophageal reflux disease)    History of colon polyps    ADENOMATOUS   History of Helicobacter pylori infection    History of shingles    History of TIA (transient ischemic attack)    Hyperlipidemia    Hypertension    Impaired memory    Interstitial cystitis    Iron deficiency    Nocturia    Osteopenia    PAF (paroxysmal atrial fibrillation) (HCC)    dx 07/ 2015   Pelvic pain in female    Urgency of urination    Vitamin D  deficiency    Wears glasses     Past Surgical History:  Procedure Laterality Date   ABDOMINAL HYSTERECTOMY     CATARACT EXTRACTION W/ INTRAOCULAR LENS  IMPLANT, BILATERAL  2009   CYSTO WITH HYDRODISTENSION  02/24/2011   Procedure: CYSTOSCOPY/HYDRODISTENSION;  Surgeon: Glendia DELENA Elizabeth, MD;  Location: [facility];  Service: Urology;  Laterality: N/A;  INSTILLATION OF marcaine  and  pyridium    CYSTO WITH HYDRODISTENSION N/A 06/20/2013   Procedure: CYSTOSCOPY/HYDRODISTENSION/INSTALLATION OF PYRIDIUM  -MARCAINE  0.5%.;  Surgeon: Glendia DELENA Elizabeth, MD;  Location: [facility];  Service: Urology;  Laterality: N/A;   CYSTO WITH HYDRODISTENSION N/A 03/22/2014   Procedure: CYSTOSCOPY/HYDRODISTENSION INSTILLATION OF MARCAINE  AND PYRDIUM ;  Surgeon: Glendia Elizabeth, MD;  Location: [facility];  Service: Urology;  Laterality: N/A;   CYSTO WITH HYDRODISTENSION N/A 09/17/2014  Procedure: CYSTOSCOPY HYDRODISTENSION INSTILLATION OF MARCAINE  AND PYRIDIUM ;  Surgeon: Glendia Elizabeth, MD;  Location: [facility];  Service: Urology;  Laterality: N/A;   CYSTO/ HOD/ INSTILLATION THERAPY  10-04-2008  &  05-02-2009   CYSTO/ LEFT URETEROSCOPY W/ LEFT URETERAL DILATION  04-15-2006   TOTAL HIP ARTHROPLASTY Left 05/29/2016   Procedure: TOTAL HIP ARTHROPLASTY ANTERIOR APPROACH;  Surgeon: Addie Glendia Hacker, MD;  Location: [facility] OR;   Service: Orthopedics;  Laterality: Left;   TRANSTHORACIC ECHOCARDIOGRAM  07-17-2013   grade I diastolic dysfuntion/  ef 60-65%/  mild AR and MR/  trivial TR   URETEROLITHOTOMY Bilateral 11-24-2004 LEFT  &  05-29-2006  RIGHT   VAULT SUSPENSION WITH GRAFT/ CYSTOCELE REPAIR  09-17-2009    Social History:   reports that she has never smoked. She has never used smokeless tobacco. She reports that she does not drink alcohol  and does not use drugs.  Allergies  Allergen Reactions   Ambien  [Zolpidem  Tartrate] Other (See Comments)     Hallucinations. Allergy not listed on MAR    Celexa [Citalopram] Other (See Comments)    Per patient, made the inside and outside of skin burn. Allergy not listed on MAR    Cymbalta [Duloxetine Hcl] Other (See Comments)    Allergy not listed on MAR    Ditropan Xl [Oxybutynin Chloride] Nausea Only, Swelling and Other (See Comments)    Throat swelling, Burning eyelids. Allergy not listed on MAR    Lescol [Fluvastatin] Other (See Comments)    Myalgias. Allergy not listed on MAR    Lipitor [Atorvastatin] Other (See Comments)    Constipation, Hot flashes, Left-leaning gait. Allergy not listed on MAR    Lyrica  [Pregabalin ] Other (See Comments)    Made patient feel funny. Allergy not listed on MAR    Neurontin [Gabapentin] Other (See Comments)    Hallucinations. Allergy not listed on MAR    Pravastatin Other (See Comments)    Myalgias. Allergy not listed on MAR    Tape Other (See Comments)    Skin sensitive - tears and bruises easily. Allergy not listed on MAR    Zetia [Ezetimibe] Other (See Comments)    Myalgias. Allergy not listed on MAR    Zocor [Simvastatin] Other (See Comments)    Myalgias. Allergy not listed on MAR     History reviewed. No pertinent family history.   Prior to Admission medications   Medication Sig Start Date End Date Taking? Authorizing Provider  acetaminophen  (TYLENOL ) 325 MG tablet Take 2 tablets (650 mg total) by mouth every 6  (six) hours as needed for mild pain (pain score 1-3) (or Fever >/= 101). 11/28/23   Rizwan, Saima, MD  Cholecalciferol  (VITAMIN D3) 1000 UNITS CAPS Take 1,000 Units by mouth daily.     [provider]  diltiazem  (CARDIZEM  CD) 120 MG 24 hr capsule Take 1 capsule (120 mg total) by mouth daily. 11/28/23 12/28/23  Rizwan, Saima, MD  divalproex  (DEPAKOTE  SPRINKLE) 125 MG capsule Take 125 mg by mouth 2 (two) times daily as needed (agitation). 07/15/23   [provider]  donepezil  (ARICEPT ) 10 MG tablet Take 10 mg by mouth every evening. 08/29/20   [provider]  hydrOXYzine  (ATARAX ) 25 MG tablet Take 1 tablet (25 mg total) by mouth at bedtime as needed. Patient taking differently: Take 25 mg by mouth every evening. 11/15/23   Ezenduka, Nkeiruka J, MD  LORazepam  (ATIVAN ) 0.5 MG tablet Take 1-2 tablets (0.5-1 mg total) by mouth 2 (two) times  daily as needed for anxiety. 11/28/23   Rizwan, Saima, MD  memantine  (NAMENDA ) 5 MG tablet Take 5 mg by mouth daily.    [provider]  pantoprazole  (PROTONIX ) 40 MG tablet Take 40 mg by mouth daily.    [provider]  pentosan polysulfate (ELMIRON ) 100 MG capsule Take 100 mg by mouth daily. 08/27/15   [provider]  polyethylene glycol (MIRALAX  / GLYCOLAX ) 17 g packet Take 17 g by mouth daily.    [provider]  QUEtiapine  (SEROQUEL ) 25 MG tablet Take 25 mg by mouth at bedtime. 09/07/23   [provider]  Rivaroxaban  (XARELTO ) 15 MG TABS tablet Take 1 tablet (15 mg total) by mouth daily with supper. Patient taking differently: Take 15 mg by mouth every evening. 07/17/13   [name], MD  traMADol  (ULTRAM ) 50 MG tablet Take 50 mg by mouth 3 (three) times daily as needed for moderate pain (pain score 4-6) or severe pain (pain score 7-10).    [provider]    Physical Exam: Vitals:   12/07/23 1610 12/07/23 1915 12/07/23 2000 12/07/23 2015  BP:  (!) 143/64 (!) 147/68   Pulse:   83 89   Resp:  18 19   Temp: 100.2 F (37.9 C)   99.8 F (37.7 C)  TempSrc: Rectal   Oral  SpO2:  100% 100%     Constitutional: NAD, no pallor or diaphoresis   Eyes: PERTLA, lids and conjunctivae normal ENMT: Mucous membranes are moist. Posterior pharynx clear of any exudate or lesions.   Neck: supple, no masses  Respiratory: no wheezing, no crackles. No accessory muscle use.  Cardiovascular: S1 & S2 heard, regular rate and rhythm. No JVD.  Abdomen: No tenderness, soft. Bowel sounds active.  Musculoskeletal: no clubbing / cyanosis. RUE swelling and pain with movement.   Skin: no significant rashes, lesions, ulcers. Warm, dry, well-perfused. Neurologic: CN 2-12 grossly intact. Moving all extremities. Awake, makes eye contact, but not answering basic questions.      Labs and Imaging on Admission: I have personally reviewed following labs and imaging studies  CBC: Recent Labs  Lab 12/07/23 1320 12/07/23 1341  WBC 8.2  --   NEUTROABS 6.0  --   HGB 9.9* 9.9*  HCT 30.7* 29.0*  MCV 86.7  --   PLT 278  --    Basic Metabolic Panel: Recent Labs  Lab 12/07/23 1320 12/07/23 1341  NA 138 140  K 3.8 3.9  CL 103 104  CO2 23  --   GLUCOSE 98 100*  BUN 22 24*  CREATININE 0.85 0.80  CALCIUM  8.9  --    GFR: Estimated Creatinine Clearance (by C-G formula based on SCr of 0.8 mg/dL) Female: 42 mL/min Female: 52.5 mL/min Liver Function Tests: Recent Labs  Lab 12/07/23 1320  AST 22  ALT 12  ALKPHOS 64  BILITOT 0.8  PROT 6.9  ALBUMIN 2.7*   No results for input(s): LIPASE, AMYLASE in the last 168 hours. No results for input(s): AMMONIA in the last 168 hours. Coagulation Profile: No results for input(s): INR, PROTIME in the last 168 hours. Cardiac Enzymes: No results for input(s): CKTOTAL, CKMB, CKMBINDEX, TROPONINI in the last 168 hours. BNP (last 3 results) No results for input(s): PROBNP in the last 8760 hours. HbA1C: No results for input(s):  HGBA1C in the last 72 hours. CBG: No results for input(s): GLUCAP in the last 168 hours. Lipid Profile: No results for input(s): CHOL, HDL, LDLCALC, TRIG, CHOLHDL,  LDLDIRECT in the last 72 hours. Thyroid  Function Tests: No results for input(s): TSH, T4TOTAL, FREET4, T3FREE, THYROIDAB in the last 72 hours. Anemia Panel: No results for input(s): VITAMINB12, FOLATE, FERRITIN, TIBC, IRON, RETICCTPCT in the last 72 hours. Urine analysis:    Component Value Date/Time   COLORURINE YELLOW 12/07/2023 1900   APPEARANCEUR HAZY (A) 12/07/2023 1900   LABSPEC 1.028 12/07/2023 1900   PHURINE 8.0 12/07/2023 1900   GLUCOSEU NEGATIVE 12/07/2023 1900   HGBUR NEGATIVE 12/07/2023 1900   BILIRUBINUR NEGATIVE 12/07/2023 1900   KETONESUR 20 (A) 12/07/2023 1900   PROTEINUR 30 (A) 12/07/2023 1900   UROBILINOGEN 0.2 09/03/2014 1649   NITRITE NEGATIVE 12/07/2023 1900   LEUKOCYTESUR NEGATIVE 12/07/2023 1900   Sepsis Labs: @LABRCNTIP (procalcitonin:4,lacticidven:4) ) Recent Results (from the past 240 hours)  MRSA Next Gen by PCR, Nasal     Status: None   Collection Time: 11/29/23  1:01 PM   Specimen: Nasal Mucosa; Nasal Swab  Result Value Ref Range Status   MRSA by PCR Next Gen NOT DETECTED NOT DETECTED Final    Comment: (NOTE) The GeneXpert MRSA Assay (FDA approved for NASAL specimens only), is one component of a comprehensive MRSA colonization surveillance program. It is not intended to diagnose MRSA infection nor to guide or monitor treatment for MRSA infections. Test performance is not FDA approved in patients less than 27 years old. Performed at [facility], [address] [place], [place] [address]   Resp panel by RT-PCR (RSV, Flu A&B, Covid) Anterior Nasal Swab     Status: None   Collection Time: 12/07/23 11:11 AM   Specimen: Anterior Nasal Swab  Result Value Ref Range Status   SARS Coronavirus 2 by RT PCR NEGATIVE NEGATIVE Final    Influenza A by PCR NEGATIVE NEGATIVE Final   Influenza B by PCR NEGATIVE NEGATIVE Final    Comment: (NOTE) The Xpert Xpress SARS-CoV-2/FLU/RSV plus assay is intended as an aid in the diagnosis of influenza from Nasopharyngeal swab specimens and should not be used as a sole basis for treatment. Nasal washings and aspirates are unacceptable for Xpert Xpress SARS-CoV-2/FLU/RSV testing.  Fact Sheet for Patients: bloggercourse.com  Fact Sheet for Healthcare Providers: seriousbroker.it  This test is not yet approved or cleared by the [place]  FDA and has been authorized for detection and/or diagnosis of SARS-CoV-2 by FDA under an Emergency Use Authorization (EUA). This EUA will remain in effect (meaning this test can be used) for the duration of the COVID-19 declaration under Section 564(b)(1) of the Act, 21 U.S.C. section 360bbb-3(b)(1), unless the authorization is terminated or revoked.     Resp Syncytial Virus by PCR NEGATIVE NEGATIVE Final    Comment: (NOTE) Fact Sheet for Patients: bloggercourse.com  Fact Sheet for Healthcare Providers: seriousbroker.it  This test is not yet approved or cleared by the [place]  FDA and has been authorized for detection and/or diagnosis of SARS-CoV-2 by FDA under an Emergency Use Authorization (EUA). This EUA will remain in effect (meaning this test can be used) for the duration of the COVID-19 declaration under Section 564(b)(1) of the Act, 21 U.S.C. section 360bbb-3(b)(1), unless the authorization is terminated or revoked.  Performed at [facility], [address], [place], [place] [address]      Radiological Exams on Admission: DG Hand 2 View Right Result Date: 12/07/2023 CLINICAL DATA:  Fall.  Right hand pain. EXAM: RIGHT HAND - 2 VIEW COMPARISON:  None Available. FINDINGS: There is no evidence of acute fracture  or  dislocation. Severe osteoarthritis of the interphalangeal joints of the second through fifth fingers. No bony lesions or destruction. Soft tissues are unremarkable. IMPRESSION: No acute findings. Severe osteoarthritis of the interphalangeal joints of the second through fifth fingers. Electronically Signed   By: Marcey Moan M.D.   On: 12/07/2023 18:05   CT CHEST ABDOMEN PELVIS W CONTRAST Result Date: 12/07/2023 CLINICAL DATA:  Blunt trauma. EXAM: CT CHEST, ABDOMEN, AND PELVIS WITH CONTRAST TECHNIQUE: Multidetector CT imaging of the chest, abdomen and pelvis was performed following the standard protocol during bolus administration of intravenous contrast. RADIATION DOSE REDUCTION: This exam was performed according to the departmental dose-optimization program which includes automated exposure control, adjustment of the mA and/or kV according to patient size and/or use of iterative reconstruction technique. CONTRAST:  75mL OMNIPAQUE  IOHEXOL  350 MG/ML SOLN COMPARISON:  Abdominopelvic CT 11/24/2023 FINDINGS: CT CHEST FINDINGS Cardiovascular: No evidence of acute aortic or vascular injury. Aortic atherosclerosis. Upper normal heart size. There are coronary artery calcifications. No pericardial effusion. Mediastinum/Nodes: No mediastinal hemorrhage. No adenopathy. Unremarkable esophagus. No visible thyroid  nodule. Lungs/Pleura: No pneumothorax. Dependent atelectasis in both lower lobes, left greater than right. Trace left pleural thickening. Benign calcified granuloma in the left lower lobe. Irregular part solid left apical pulmonary nodule with ill-defined margins. Solid component measures approximately 7 x 5 x 10 mm, series 4, image 35. Breathing motion artifact. Musculoskeletal: No acute fracture of the ribs, included clavicles or shoulder girdles. Mild scoliotic curvature of the thoracic spine, no acute fracture. CT ABDOMEN PELVIS FINDINGS Hepatobiliary: No hepatic injury or perihepatic hematoma. Scattered  hepatic cysts, stable from prior exam. Gallbladder is distended without inflammation. Pancreas: No evidence of injury. No ductal dilatation or inflammation. Spleen: No splenic injury or perisplenic hematoma. Adrenals/Urinary Tract: No adrenal hemorrhage or renal injury identified. Improved hydronephrosis from prior exam with persistent dilatation of the renal collecting systems. Improved hydroureter, near completely resolved. Redemonstrated right renal cyst. Nonobstructing left renal calculi. No further follow-up imaging is recommended. Foley catheter within the urinary bladder which is only minimally distended. Air in the bladder typically related to Foley. Stomach/Bowel: No evidence of bowel injury. No mesenteric hematoma. No bowel wall thickening or inflammation. Colonic diverticulosis without diverticulitis. Moderate colonic stool burden. Normal appendix. Vascular/Lymphatic: No vascular injury. No retroperitoneal fluid. Abdominal aortic atherosclerosis. No suspicious lymphadenopathy. Reproductive: Status post hysterectomy. No adnexal masses. Other: No free fluid or free air. Musculoskeletal: Left hip arthroplasty creates regional streak artifact. Degenerative change in the lumbar spine without acute fracture. No pelvic fracture, with special attention to the left pubic bone with questionable lucency on radiograph. IMPRESSION: 1. No evidence of acute traumatic injury to the chest, abdomen, or pelvis. 2. Irregular part solid left apical pulmonary nodule with ill-defined margins. Solid component measures approximately 7 x 5 x 10 mm. Differential considerations include scarring or neoplasm. Recommend elective pulmonary consultation. 3. Improved hydronephrosis from prior exam with persistent dilatation of the renal collecting systems. Improved hydroureter, near completely resolved. 4. Colonic diverticulosis without diverticulitis. Aortic Atherosclerosis (ICD10-I70.0). Electronically Signed   By: Andrea Gasman M.D.    On: 12/07/2023 16:09   DG Pelvis 1-2 Views Result Date: 12/07/2023 EXAM: 1 or 2 VIEW(S) XRAY OF THE PELVIS 12/07/2023 12:21:00 PM COMPARISON: None available. CLINICAL HISTORY: [id number] Fall [id number] FINDINGS: BONES AND JOINTS: Left hip arthroplasty noted. Pubic symphysis degenerative spurring. A vertical lucency projects over the left pubic bone. If the patient has point tenderness along the pubis slightly eccentric to the left, then consider dedicated  CT pelvis to further assess for pubic bone or pelvic fracture. No joint dislocation. SOFT TISSUES: The soft tissues are unremarkable. IMPRESSION: 1. Vertical lucency over the left pubic bone; if there is point tenderness to palpation along the pubis slightly eccentric to the left, consider dedicated CT pelvis to further assess for pubic or pelvic fracture. 2. Pubic symphysis degenerative spurring. 3. Left hip arthroplasty. Electronically signed by: Ryan Salvage MD 12/07/2023 01:04 PM EST RP Workstation: [id number]   DG Shoulder Right Result Date: 12/07/2023 EXAM: 1 VIEW(S) XRAY OF THE RIGHT SHOULDER 12/07/2023 12:21:00 PM COMPARISON: None available. CLINICAL HISTORY: fall, right arm pain. FINDINGS: BONES AND JOINTS: Glenohumeral joint is normally aligned. No acute fracture or dislocation. The [facility] joint is unremarkable in appearance. SOFT TISSUES: No abnormal calcifications. Visualized lung is unremarkable. IMPRESSION: 1. No acute fracture or dislocation. Electronically signed by: Ryan Salvage MD 12/07/2023 01:01 PM EST RP Workstation: [id number]   DG Foot Complete Left Result Date: 12/07/2023 EXAM: 3 OR MORE VIEW(S) XRAY OF THE LEFT FOOT 12/07/2023 12:21:00 PM COMPARISON: None available. CLINICAL HISTORY: fall FINDINGS: BONES AND JOINTS: No acute fracture. No focal osseous lesion. No joint dislocation. Mild hallux valgus deformity. Osteoarthritis. SOFT TISSUES: Mild soft tissue swelling dorsally along the distal forefoot. IMPRESSION: 1. No acute  fracture or dislocation. 2. Mild hallux valgus deformity and osteoarthritis. 3. Mild soft tissue swelling dorsally along the distal forefoot. Electronically signed by: Ryan Salvage MD 12/07/2023 01:00 PM EST RP Workstation: [id number]   DG Elbow Complete Right Result Date: 12/07/2023 EXAM: 3 VIEW(S) XRAY OF THE ELBOW COMPARISON: None available. CLINICAL HISTORY: fall FINDINGS: BONES AND JOINTS: No acute fracture. No focal osseous lesion. No joint dislocation. No joint effusion. SOFT TISSUES: The soft tissues are unremarkable. IMPRESSION: 1. No acute abnormality. Electronically signed by: Ryan Salvage MD 12/07/2023 12:59 PM EST RP Workstation: [id number]   DG Chest 1 View Result Date: 12/07/2023 EXAM: 1 VIEW(S) XRAY OF THE CHEST 12/07/2023 12:21:00 PM COMPARISON: 11/24/2023 CLINICAL HISTORY: Fall [id number] FINDINGS: LUNGS AND PLEURA: Leftward rotated frontal projection. Trace left pleural effusion. Left retrocardiac basilar airspace opacity could reflect atelectasis or pneumonia, follow up chest radiography recommended to ensure clearance after treatment. No pneumothorax. HEART AND MEDIASTINUM: Atherosclerotic calcifications. BONES AND SOFT TISSUES: No acute osseous abnormality. IMPRESSION: 1. Left retrocardiac basilar airspace opacity, possibly atelectasis or pneumonia, with follow-up chest radiography recommended to ensure clearance after treatment. 2. Trace left pleural effusion. Electronically signed by: Ryan Salvage MD 12/07/2023 12:58 PM EST RP Workstation: [id number]   CT Head Wo Contrast Result Date: 12/07/2023 CLINICAL DATA:  Mental status change.  Fall. EXAM: CT HEAD WITHOUT CONTRAST CT CERVICAL SPINE WITHOUT CONTRAST TECHNIQUE: Multidetector CT imaging of the head and cervical spine was performed following the standard protocol without intravenous contrast. Multiplanar CT image reconstructions of the cervical spine were also generated. RADIATION DOSE REDUCTION: This exam was performed  according to the departmental dose-optimization program which includes automated exposure control, adjustment of the mA and/or kV according to patient size and/or use of iterative reconstruction technique. COMPARISON:  11/24/2023, 11/07/2023 FINDINGS: CT HEAD FINDINGS Brain: There is mild age related atrophic changes the ventricles, cisterns and other CSF spaces are otherwise unchanged. Mild chronic ischemic microvascular disease is present. No mass, mass effect, shift of midline structures or acute hemorrhage. Vascular: No hyperdense vessel or unexpected calcification. Skull: Normal. Negative for fracture or focal lesion. Sinuses/Orbits: Orbits are normal. Paranasal sinuses are well developed and are clear. Mastoid air cells are clear. Other: None. CT CERVICAL  SPINE FINDINGS Alignment: No posttraumatic subluxation. Skull base and vertebrae: Mild spondylosis of the cervical spine to include uncovertebral joint spurring and facet arthropathy. Vertebral body heights within the cervical spine are maintained. Minimal left-sided neural foraminal narrowing at the C5-6 level mild bilateral neural foraminal narrowing at the C6-7 level. No acute fracture. Soft tissues and spinal canal: Soft tissues are normal. Borderline canal stenosis at the C6-7 level. Disc levels: Disc space narrowing from the C4-5 level to the C6-7 level. Upper chest: No acute findings. Stable 1.2 cm subsolid nodule over the left apex. Other: None. IMPRESSION: 1. No acute brain injury. 2. Mild age related atrophic changes and chronic ischemic microvascular disease. 3. No acute cervical spine injury. 4. Mild spondylosis of the cervical spine with disc disease from the C4-5 level to the C6-7 level. Minimal left-sided neural foraminal narrowing at the C5-6 level and mild bilateral neural foraminal narrowing at the C6-7 level. Borderline canal stenosis at the C6-7 level. 5. Stable 1.2 cm subsolid nodule over the left apex. Continues to recommend further  evaluation with noncontrast chest CT as previously recommended. Electronically Signed   By: Toribio Agreste M.D.   On: 12/07/2023 12:01   CT Cervical Spine Wo Contrast Result Date: 12/07/2023 CLINICAL DATA:  Mental status change.  Fall. EXAM: CT HEAD WITHOUT CONTRAST CT CERVICAL SPINE WITHOUT CONTRAST TECHNIQUE: Multidetector CT imaging of the head and cervical spine was performed following the standard protocol without intravenous contrast. Multiplanar CT image reconstructions of the cervical spine were also generated. RADIATION DOSE REDUCTION: This exam was performed according to the departmental dose-optimization program which includes automated exposure control, adjustment of the mA and/or kV according to patient size and/or use of iterative reconstruction technique. COMPARISON:  11/24/2023, 11/07/2023 FINDINGS: CT HEAD FINDINGS Brain: There is mild age related atrophic changes the ventricles, cisterns and other CSF spaces are otherwise unchanged. Mild chronic ischemic microvascular disease is present. No mass, mass effect, shift of midline structures or acute hemorrhage. Vascular: No hyperdense vessel or unexpected calcification. Skull: Normal. Negative for fracture or focal lesion. Sinuses/Orbits: Orbits are normal. Paranasal sinuses are well developed and are clear. Mastoid air cells are clear. Other: None. CT CERVICAL SPINE FINDINGS Alignment: No posttraumatic subluxation. Skull base and vertebrae: Mild spondylosis of the cervical spine to include uncovertebral joint spurring and facet arthropathy. Vertebral body heights within the cervical spine are maintained. Minimal left-sided neural foraminal narrowing at the C5-6 level mild bilateral neural foraminal narrowing at the C6-7 level. No acute fracture. Soft tissues and spinal canal: Soft tissues are normal. Borderline canal stenosis at the C6-7 level. Disc levels: Disc space narrowing from the C4-5 level to the C6-7 level. Upper chest: No acute findings.  Stable 1.2 cm subsolid nodule over the left apex. Other: None. IMPRESSION: 1. No acute brain injury. 2. Mild age related atrophic changes and chronic ischemic microvascular disease. 3. No acute cervical spine injury. 4. Mild spondylosis of the cervical spine with disc disease from the C4-5 level to the C6-7 level. Minimal left-sided neural foraminal narrowing at the C5-6 level and mild bilateral neural foraminal narrowing at the C6-7 level. Borderline canal stenosis at the C6-7 level. 5. Stable 1.2 cm subsolid nodule over the left apex. Continues to recommend further evaluation with noncontrast chest CT as previously recommended. Electronically Signed   By: Toribio Agreste M.D.   On: 12/07/2023 12:01    EKG: Independently reviewed. Atrial flutter, non-specific IVCD with LAD, QTc 529 ms.   Assessment/Plan   1. Acute  encephalopathy  - Patient has dementia and is said to be more confused than usual; she has just received IV Ativan  and fentanyl  prior to admission  - No acute findings noted on head CT, no pneumonia or other infectious process noted on CT and UA is not suggestive of infection  - She appears to be in severe pain any time she is moved or her right arm is manipulated; imaging negative for fractures or dislocations  - Suspect the increased confusion may be d/t pain  - Use delirium precautions, schedule Tylenol , continue tramadol  if-needed, use low-dose fentanyl  as needed for severe pain    2. Dementia with behavioral disturbance  - Hold Aricept  given prolonged QT, continue Namenda , Seroquel , as-needed Ativan , and Depakote , use delirium precautions    3. Prolonged QT interval  - Supplement potassium, check magnesium  level, avoid QT-prolonging medications    4. PAF  - Continue diltiazem  and Xarelto     5. Urinary retention  - Hydronephrosis has improved on CT in ED, will continue Foley for now    6. Anemia  - Appears stable   7. Lung nodule  - Outpatient follow-up advised    DVT  prophylaxis: Xarelto   Code Status: DNR/DNI  Level of Care: Level of care: Telemetry Family Communication: Daughter updated in ED  Disposition Plan:  Patient is from: ALF  Anticipated d/c is to: SNF  Anticipated d/c date is: 12/3 or 12/09/23  Patient currently: Pending pain-control, disposition planning  Consults called: None  Admission status: Observation     Evalene GORMAN Sprinkles, MD [facility]  12/07/2023, 9:50 PM

## 2023-12-07 NOTE — ED Triage Notes (Signed)
 According to [facility]: Pt is coming from memory care unit ([facility]), staff complaining of ams and stroke like symptoms. According to staff she has been unable to walk without 2 people assisting her since her fall last Tuesday. Pt complaining of right arm pain since fall.  Vitals: Bp 130/82 Hr 81 14 rr 99% room air. Cbg 141

## 2023-12-07 NOTE — ED Provider Notes (Signed)
 [place] EMERGENCY DEPARTMENT AT [facility] Provider Note   CSN: [id number] Arrival date & time: 12/07/23  1103     Patient presents with: No chief complaint on file.   Christine [name] is a 89 y.o. adult.  {Add pertinent medical, surgical, social history, OB history to HPI:731} 89 year old female with prior medical history as detailed below presents from [facility] memory care unit for evaluation.  Staff reports to EMS that the patient has been unable to ambulate without assistance since a fall that occurred last Tuesday.  Patient reportedly is not her normal self with increased confusion.  Patient does have dementia at baseline.  Patient is on Xarelto  with history of A-fib.  The history is provided by the patient and medical records.       Prior to Admission medications   Medication Sig Start Date End Date Taking? Authorizing Provider  acetaminophen  (TYLENOL ) 325 MG tablet Take 2 tablets (650 mg total) by mouth every 6 (six) hours as needed for mild pain (pain score 1-3) (or Fever >/= 101). 11/28/23   Rizwan, Saima, MD  Cholecalciferol  (VITAMIN D3) 1000 UNITS CAPS Take 1,000 Units by mouth daily.     [provider]  diltiazem  (CARDIZEM  CD) 120 MG 24 hr capsule Take 1 capsule (120 mg total) by mouth daily. 11/28/23 12/28/23  Rizwan, Saima, MD  divalproex  (DEPAKOTE  SPRINKLE) 125 MG capsule Take 125 mg by mouth 2 (two) times daily as needed (agitation). 07/15/23   [provider]  donepezil  (ARICEPT ) 10 MG tablet Take 10 mg by mouth every evening. 08/29/20   [provider]  hydrOXYzine  (ATARAX ) 25 MG tablet Take 1 tablet (25 mg total) by mouth at bedtime as needed. Patient taking differently: Take 25 mg by mouth every evening. 11/15/23   Ezenduka, Nkeiruka J, MD  LORazepam  (ATIVAN ) 0.5 MG tablet Take 1-2 tablets (0.5-1 mg total) by mouth 2 (two) times daily as needed for anxiety. 11/28/23   Rizwan, Saima, MD  memantine  (NAMENDA ) 5 MG  tablet Take 5 mg by mouth daily.    [provider]  pantoprazole  (PROTONIX ) 40 MG tablet Take 40 mg by mouth daily.    [provider]  pentosan polysulfate (ELMIRON ) 100 MG capsule Take 100 mg by mouth daily. 08/27/15   [provider]  polyethylene glycol (MIRALAX  / GLYCOLAX ) 17 g packet Take 17 g by mouth daily.    [provider]  QUEtiapine  (SEROQUEL ) 25 MG tablet Take 25 mg by mouth at bedtime. 09/07/23   [provider]  Rivaroxaban  (XARELTO ) 15 MG TABS tablet Take 1 tablet (15 mg total) by mouth daily with supper. Patient taking differently: Take 15 mg by mouth every evening. 07/17/13   [name], MD  traMADol  (ULTRAM ) 50 MG tablet Take 50 mg by mouth 3 (three) times daily as needed for moderate pain (pain score 4-6) or severe pain (pain score 7-10).    [provider]    Allergies: Ambien  [zolpidem  tartrate], Celexa [citalopram], Cymbalta [duloxetine hcl], Ditropan xl [oxybutynin chloride], Lescol [fluvastatin], Lipitor [atorvastatin], Lyrica  [pregabalin ], Neurontin [gabapentin], Pravastatin, Tape, Zetia [ezetimibe], and Zocor [simvastatin]    Review of Systems  Unable to perform ROS: Dementia    Updated Vital Signs BP (!) 143/62 (BP Location: Left Arm)   Pulse 88   Temp 98.6 F (37 C) (Axillary)   Resp 18   SpO2 100%   Physical Exam Vitals and nursing note reviewed.  Constitutional:      General: She  is not in acute distress.    Appearance: She is well-developed.  HENT:     Head: Normocephalic and atraumatic.  Eyes:     Extraocular Movements: Extraocular movements intact.     Conjunctiva/sclera: Conjunctivae normal.     Pupils: Pupils are equal, round, and reactive to light.  Cardiovascular:     Rate and Rhythm: Normal rate and regular rhythm.     Heart sounds: No murmur heard. Pulmonary:     Effort: Pulmonary effort is normal. No respiratory distress.     Breath sounds: Normal breath sounds.  Abdominal:      Palpations: Abdomen is soft.     Tenderness: There is no abdominal tenderness.  Genitourinary:    Comments: Foley catheter is in place. Musculoskeletal:        General: No swelling.     Cervical back: Neck supple.     Comments: Contusion noted to the dorsal aspect of the left foot.  Skin:    General: Skin is warm and dry.     Capillary Refill: Capillary refill takes less than 2 seconds.  Neurological:     Mental Status: She is alert.  Psychiatric:        Mood and Affect: Mood normal.     (all labs ordered are listed, but only abnormal results are displayed) Labs Reviewed  CULTURE, BLOOD (ROUTINE X 2)  CULTURE, BLOOD (ROUTINE X 2)  RESP PANEL BY RT-PCR (RSV, FLU A&B, COVID)  RVPGX2  CBC WITH DIFFERENTIAL/PLATELET  ETHANOL  COMPREHENSIVE METABOLIC PANEL WITH GFR  RAPID URINE DRUG SCREEN, HOSP PERFORMED  URINALYSIS, W/ REFLEX TO CULTURE (INFECTION SUSPECTED)  I-STAT CHEM 8, ED  I-STAT CG4 LACTIC ACID, ED  TYPE AND SCREEN  TROPONIN I (HIGH SENSITIVITY)    EKG: None  Radiology: No results found.  {Document cardiac monitor, telemetry assessment procedure when appropriate:32947} Procedures   Medications Ordered in the ED - No data to display    {Click here for ABCD2, HEART and other calculators REFRESH Note before signing:1}                              Medical Decision Making Amount and/or Complexity of Data Reviewed Labs: ordered. Radiology: ordered.   ***  {Document critical care time when appropriate  Document review of labs and clinical decision tools ie CHADS2VASC2, etc  Document your independent review of radiology images and any outside records  Document your discussion with family members, caretakers and with consultants  Document social determinants of health affecting pt's care  Document your decision making why or why not admission, treatments were needed:32947:::1}   Final diagnoses:  None    ED Discharge Orders     None

## 2023-12-07 NOTE — ED Notes (Signed)
 Phlebotomy asked to stick pt on return to ED.

## 2023-12-07 NOTE — ED Notes (Signed)
 Foely replaced with caron RN and Tree Surgeon. Unstagable sores found on buttocks and sacrum. Sacral heart placed on backside. While placing Foley pts vagina noted to be coaked in white thick discharge.

## 2023-12-07 NOTE — ED Provider Notes (Signed)
 Care of the patient assumed at signout.  I discussed initial findings with the patient's daughter. Patient's dementia is prohibitive. Daughter requests evaluation of the right hand, which is swollen on exam but the patient is moving it freely.  X-ray without evidence for fracture. Patient found to have evidence for pneumonia on x-ray and with lactic acidosis, mild fever, given her advanced age, comorbidities, patient be admitted. Patient is currently residing in a dementia care facility, may require assistance with our [facility] colleagues for return to that facility if they have additional capacity for care.    Garrick Charleston, MD 12/07/23 [phone]

## 2023-12-08 ENCOUNTER — Other Ambulatory Visit: Payer: Self-pay

## 2023-12-08 DIAGNOSIS — G9340 Encephalopathy, unspecified: Secondary | ICD-10-CM | POA: Diagnosis not present

## 2023-12-08 LAB — BASIC METABOLIC PANEL WITH GFR
Anion gap: 16 — ABNORMAL HIGH (ref 5–15)
BUN: 20 mg/dL (ref 8–23)
CO2: 22 mmol/L (ref 22–32)
Calcium: 8.6 mg/dL — ABNORMAL LOW (ref 8.9–10.3)
Chloride: 104 mmol/L (ref 98–111)
Creatinine, Ser: 0.67 mg/dL (ref 0.44–1.00)
GFR, Estimated: >60 mL/min (ref >60)
Glucose, Bld: 105 mg/dL — ABNORMAL HIGH (ref 70–99)
Potassium: 3.7 mmol/L (ref 3.5–5.1)
Sodium: 142 mmol/L (ref 135–145)

## 2023-12-08 LAB — PHOSPHORUS: Phosphorus: 4 mg/dL (ref 2.5–4.6)

## 2023-12-08 LAB — CBC
HCT: 26.5 % — ABNORMAL LOW (ref 36.0–46.0)
Hemoglobin: 8.8 g/dL — ABNORMAL LOW (ref 12.0–15.0)
MCH: 28.6 pg (ref 26.0–34.0)
MCHC: 33.2 g/dL (ref 30.0–36.0)
MCV: 86 fL (ref 80.0–100.0)
Platelets: 257 K/uL (ref 150–400)
RBC: 3.08 MIL/uL — ABNORMAL LOW (ref 3.87–5.11)
RDW: 14.8 % (ref 11.5–15.5)
WBC: 6.4 K/uL (ref 4.0–10.5)
nRBC: 0 % (ref 0.0–0.2)

## 2023-12-08 LAB — MAGNESIUM: Magnesium: 1.9 mg/dL (ref 1.7–2.4)

## 2023-12-08 MED ORDER — COLLAGENASE 250 UNIT/GM EX OINT
1.0000 | TOPICAL_OINTMENT | Freq: Every day | CUTANEOUS | Status: AC
  Administered 2023-12-08 – 2023-12-16 (×9): 1 via TOPICAL
  Filled 2023-12-08: qty 30

## 2023-12-08 MED ORDER — ZINC OXIDE 40 % EX OINT
TOPICAL_OINTMENT | Freq: Two times a day (BID) | CUTANEOUS | Status: DC
  Filled 2023-12-08: qty 57

## 2023-12-08 MED ORDER — CHLORHEXIDINE GLUCONATE CLOTH 2 % EX PADS
6.0000 | MEDICATED_PAD | Freq: Every day | CUTANEOUS | Status: DC
  Administered 2023-12-08 – 2023-12-16 (×9): 6 via TOPICAL

## 2023-12-08 NOTE — ED Notes (Signed)
 RN gave courtesy call

## 2023-12-08 NOTE — Plan of Care (Signed)

## 2023-12-08 NOTE — Progress Notes (Signed)
 Transition of Care [facility]) - Inpatient Brief Assessment   Patient Details  Name: Christine [name] MRN: [id number] Date of Birth: [birth year]  Transition of Care [facility]) CM/SW Contact:    Rosaline JONELLE Joe, RN Phone Number: 12/08/2023, 10:36 AM   Clinical Narrative: CM met with the patient's daughter at the bedside and she states that patient lives at [facility] care and is normally ambulatory with assistance with a RW.  The patient was seen by PT and SNF is recommended.  I spoke with the daughter and since patient is not back to her baseline with ambulance - SNF placement will be recommended for placement.   The daughter chose [facility] placement if possible.  Patient has RW at the facility.  FL2 will be completed and patient will be faxed out in the hub for bed offers by MSW and follow up with the daughter for  choice.   Transition of Care Asessment: Insurance and Status: (P) Insurance coverage has been reviewed Patient has primary care physician: (P) Yes Home environment has been reviewed: (P) from [facility]F Prior level of function:: (P) facility provides care Prior/Current Home Services: (P) No current home services Social Drivers of Health Review: (P) SDOH reviewed needs interventions Readmission risk has been reviewed: (P) Yes Transition of care needs: (P) transition of care needs identified, TOC will continue to follow

## 2023-12-08 NOTE — Progress Notes (Signed)
 Patient arrived to unit at 0015 and was transferred into hospital bed with staff assistance. Patient groaned and cried in pain during transfer, but did not respond to any questions of where pain was located. This RN attempted to begin admission assessment questions. However, Pt could not answer anything due to mental status. Provided patient with call bell, personal items, and water . Patient was positioned comfortably with bed in lowest position and wheels locked.

## 2023-12-08 NOTE — Plan of Care (Signed)

## 2023-12-08 NOTE — [place] FL2 (Signed)
 [place]  MEDICAID FL2 LEVEL OF CARE FORM     IDENTIFICATION  Patient Name: Christine [name] Birthdate: May 06, 1933 Sex: adult Admission Date (Current Location): 12/07/2023  [facility] and [place] Number:  Producer, Television/film/video and Address:  [facility], [address] [place], [place] [address]      Provider Number: [id number]  Attending Physician Name and Address:  Dibia, Landon BRAVO, MD  Relative Name and Phone Number:  [name] Clause (daughter) Ph: [phone]    Current Level of Care: Hospital Recommended Level of Care: Skilled Nursing Facility Prior Approval Number:    Date Approved/Denied:   PASRR Number: [id number]  Discharge Plan: SNF    Current Diagnoses: Patient Active Problem List   Diagnosis Date Noted   Nodule of apex of left lung 12/07/2023   Prolonged QT interval 12/07/2023   Acute urinary retention 11/24/2023   Protein-calorie malnutrition, severe 11/08/2023   Syncope 01/30/2022   Syncope and collapse 01/30/2022   Malnutrition of moderate degree 03/15/2021   Vitamin B12 deficiency 03/14/2021   Dyslipidemia 03/14/2021   Recurrent falls 03/14/2021   NSTEMI (non-ST elevated myocardial infarction) (HCC) 03/13/2021   History of kidney stones 10/23/2020   Dementia without behavioral disturbance (HCC) 08/30/2020   Pain in right knee 08/30/2020   Vitamin D  deficiency 08/30/2020   Bilateral impacted cerumen 08/22/2019   Thrombophilia 03/02/2019   Increased frequency of urination 10/14/2017   Impacted cerumen 08/04/2016   Age-related osteoporosis without current pathological fracture 07/06/2016   Hip fracture (HCC) 05/29/2016   Closed left hip fracture (HCC) 05/27/2016   Insomnia 12/17/2015   Memory loss 12/17/2015   Dysuria 12/04/2015   AKI (acute kidney injury) 10/01/2015   Acute encephalopathy 10/01/2015   Interstitial cystitis 10/01/2015   Essential hypertension 10/01/2015   Rhabdomyolysis 09/30/2015   Major depression, single  episode 07/10/2015   Non-thrombocytopenic purpura 07/10/2015   Encounter for general adult medical examination without abnormal findings 11/30/2014   Fall 08/07/2013   Pain in left leg 08/07/2013   Lower urinary tract infectious disease 07/16/2013   Paroxysmal atrial fibrillation (HCC) 07/16/2013   Chest pain 07/16/2013   Transient ischemic attack 07/04/2012   Chronic pain syndrome 03/28/2012   Residual hemorrhoidal skin tags 01/18/2012   Abdominal pain 02/03/2010   Pain in limb 10/07/2009   Anemia 03/26/2009   Allergic rhinitis 02/27/2009   Anxiety disorder 02/27/2009   Hyperlipidemia 02/27/2009   Low compliance bladder 02/27/2009   DYSPEPSIA 01/18/2008   ADENOMATOUS COLONIC POLYP 01/17/2008   HEMORRHOIDS, INTERNAL 01/17/2008   ESOPHAGITIS 01/17/2008   DIVERTICULOSIS, COLON 01/17/2008   HEMORRHOIDS-EXTERNAL 12/07/2007   GERD 12/07/2007   Constipation 12/07/2007   RECTAL BLEEDING 12/07/2007   Loss of weight 12/07/2007   Nausea alone 12/07/2007   HEARTBURN 12/07/2007   ABDOMINAL BLOATING 12/07/2007   History of colonic polyps 12/07/2007    Orientation RESPIRATION BLADDER Height & Weight     Self, Situation  Normal Incontinent Weight:   Height:     BEHAVIORAL SYMPTOMS/MOOD NEUROLOGICAL BOWEL NUTRITION STATUS      Incontinent Diet  AMBULATORY STATUS COMMUNICATION OF NEEDS Skin   Extensive Assist Verbally Other (Comment) (Erythema: buttocks, sacrum; Ecchymosis: buttocks)                       Personal Care Assistance Level of Assistance  Bathing, Feeding, Dressing Bathing Assistance: Maximum assistance Feeding assistance: Maximum assistance Dressing Assistance: Maximum assistance     Functional Limitations Info  Sight Info: Impaired        SPECIAL CARE FACTORS FREQUENCY  PT (By licensed PT), OT (By licensed OT)     PT Frequency: 5x/week OT Frequency: 5x/week            Contractures Contractures Info: Not present    Additional Factors Info   Code Status, Allergies Code Status Info: DNR Allergies Info: Ambien  (Zolpidem  Tartrate); Celexa (Citalopram); Cymbalta (Duloxetine Hcl); Ditropan Xl (Oxybutynin Chloride); Lescol (Fluvastatin); Lipitor (Pregabalin ); Neurontin (Gabapentin); Pravastatin; Tape; Zetia (Ezetimibe); Zocor (Simvastatin)           Current Medications (12/08/2023):  This is the current hospital active medication list Current Facility-Administered Medications  Medication Dose Route Frequency Provider Last Rate Last Admin   acetaminophen  (TYLENOL ) tablet 500 mg  500 mg Oral Q6H Opyd, Timothy S, MD   500 mg at 12/08/23 0832   bisacodyl (DULCOLAX) EC tablet 5 mg  5 mg Oral Daily PRN Opyd, Timothy S, MD       collagenase (SANTYL) ointment 1 Application  1 Application Topical Daily Opyd, Timothy S, MD   1 Application at 12/08/23 0834   diltiazem  (CARDIZEM  CD) 24 hr capsule 120 mg  120 mg Oral Daily Opyd, Timothy S, MD   120 mg at 12/08/23 9165   divalproex  (DEPAKOTE  SPRINKLE) capsule 125 mg  125 mg Oral BID PRN Opyd, Timothy S, MD       fentaNYL  (SUBLIMAZE ) injection 12.5-50 mcg  12.5-50 mcg Intravenous Q2H PRN Opyd, Timothy S, MD       liver oil-zinc oxide (DESITIN) 40 % ointment   Topical BID Opyd, Timothy S, MD   Given at 12/08/23 9164   LORazepam  (ATIVAN ) tablet 0.5-1 mg  0.5-1 mg Oral BID PRN Opyd, Timothy S, MD       memantine  (NAMENDA ) tablet 5 mg  5 mg Oral Daily Opyd, Timothy S, MD   5 mg at 12/08/23 9167   pantoprazole  (PROTONIX ) EC tablet 40 mg  40 mg Oral Daily Opyd, Timothy S, MD   40 mg at 12/08/23 9167   polyethylene glycol (MIRALAX  / GLYCOLAX ) packet 17 g  17 g Oral Daily PRN Opyd, Timothy S, MD       potassium chloride  (KLOR-CON  M) CR tablet 10 mEq  10 mEq Oral Once Opyd, Timothy S, MD       QUEtiapine  (SEROQUEL ) tablet 25 mg  25 mg Oral QHS Opyd, Timothy S, MD       Rivaroxaban  (XARELTO ) tablet 15 mg  15 mg Oral QPM Opyd, Timothy S, MD       sodium chloride  flush (NS) 0.9 % injection 3 mL  3 mL  Intravenous Q12H Opyd, Timothy S, MD   3 mL at 12/08/23 0835   traMADol  (ULTRAM ) tablet 50 mg  50 mg Oral Q8H PRN Opyd, Evalene RAMAN, MD       trimethobenzamide (TIGAN) injection 200 mg  200 mg Intramuscular Q6H PRN Opyd, Timothy S, MD         Discharge Medications: Please see discharge summary for a list of discharge medications.  Relevant Imaging Results:  Relevant Lab Results:   Additional Information SSN: [id number]  Sharyne Drum, Student-Social Work

## 2023-12-08 NOTE — Evaluation (Addendum)
 Physical Therapy Evaluation Patient Details Name: Christine [name] MRN: [id number] DOB: 01-Oct-1933 Today's Date: 12/08/2023  History of Present Illness  Pt is a 89 y.o. female admitted 12/2 with acute encephalopathy. Pt with 2 hospitalizations in Nov 2025 with similar presentation. Recent falls. CTH negative. RUE xrays negative. PMH significant for dementia, CAD, AFIB, TIA, falls, recurrent UTIs.  Clinical Impression  Pt admitted with above diagnosis. PTA pt resided in memory care ALF. Per chart, pt has been requiring +2 assist for mobility following a fall last week. Pt currently with functional limitations due to the deficits listed below (see PT Problem List). On eval, pt required total assist bed mobility. RUE/LE pain limiting factor. Pt moaning and resisting wit any attempts to move or touch RUE/LE. Edema noted. Pillows utilized for positioning, elevation and pressure relief. Pt will benefit from acute skilled PT to increase their independence and safety with mobility to allow discharge. Post acute, recommend further therapy in inpatient setting < 3 hours/day.          If plan is discharge home, recommend the following: Two people to help with walking and/or transfers;Two people to help with bathing/dressing/bathroom   Can travel by private vehicle   No    Equipment Recommendations Other (comment) (defer to next venue)  Recommendations for Other Services       Functional Status Assessment Patient has had a recent decline in their functional status and/or demonstrates limited ability to make significant improvements in function in a reasonable and predictable amount of time     Precautions / Restrictions Precautions Precautions: Fall Recall of Precautions/Restrictions: Impaired      Mobility  Bed Mobility Overal bed mobility: Needs Assistance Bed Mobility: Supine to Sit, Sit to Supine     Supine to sit: Total assist Sit to supine: Total assist   General bed mobility  comments: unable to attain full upright sitting EOB due to pain/pt resisting    Transfers                   General transfer comment: unable    Ambulation/Gait                  Stairs            Wheelchair Mobility     Tilt Bed    Modified Rankin (Stroke Patients Only)       Balance                                             Pertinent Vitals/Pain Pain Assessment Pain Assessment: Faces Faces Pain Scale: Hurts whole lot Pain Location: RUE/LE Pain Descriptors / Indicators: Discomfort, Grimacing, Guarding, Moaning, Tender Pain Intervention(s): Limited activity within patient's tolerance, Repositioned    Home Living Family/patient expects to be discharged to:: Assisted living                   Additional Comments: At [facility] Care at [facility]    Prior Function Prior Level of Function : Needs assist;Patient poor historian/Family not available;History of Falls (last six months)             Mobility Comments: Per chart, pt recently requiring +2 assist for amb at ALF ADLs Comments: assist needed     Extremity/Trunk Assessment   Upper Extremity Assessment Upper Extremity Assessment: Difficult to assess due to impaired cognition;RUE deficits/detail  RUE Deficits / Details: moaning in pain with any attempts to mobilize/ROM, tender to touch, edema noted throughout RUE: Unable to fully assess due to pain    Lower Extremity Assessment Lower Extremity Assessment: Generalized weakness;RLE deficits/detail RLE Deficits / Details: moaning in pain with any attempts to mobilize/ROM, tender to touch, edema noted throughout RLE: Unable to fully assess due to pain    Cervical / Trunk Assessment Cervical / Trunk Assessment: Kyphotic  Communication   Communication Communication: Impaired Factors Affecting Communication: Reduced clarity of speech;Difficulty expressing self    Cognition Arousal: Alert Behavior During  Therapy: Anxious, Flat affect   PT - Cognitive impairments: History of cognitive impairments, Memory                       PT - Cognition Comments: dementia at baseline Following commands: Impaired (not following commands)       Cueing Cueing Techniques: Verbal cues, Gestural cues, Tactile cues     General Comments General comments (skin integrity, edema, etc.): pillows utilized for positioning, elevation, comfort RUE/LE    Exercises     Assessment/Plan    PT Assessment Patient needs continued PT services  PT Problem List Decreased strength;Decreased range of motion;Decreased activity tolerance;Decreased balance;Decreased mobility;Decreased knowledge of use of DME;Decreased safety awareness;Decreased knowledge of precautions;Pain;Decreased cognition       PT Treatment Interventions DME instruction;Gait training;Functional mobility training;Therapeutic activities;Therapeutic exercise;Balance training;Patient/family education    PT Goals (Current goals can be found in the Care Plan section)  Acute Rehab PT Goals Patient Stated Goal: unable to state PT Goal Formulation: Patient unable to participate in goal setting Time For Goal Achievement: 12/22/23 Potential to Achieve Goals: Fair    Frequency Min 1X/week     Co-evaluation               AM-PAC PT 6 Clicks Mobility  Outcome Measure Help needed turning from your back to your side while in a flat bed without using bedrails?: Total Help needed moving from lying on your back to sitting on the side of a flat bed without using bedrails?: Total Help needed moving to and from a bed to a chair (including a wheelchair)?: Total Help needed standing up from a chair using your arms (e.g., wheelchair or bedside chair)?: Total Help needed to walk in hospital room?: Total Help needed climbing 3-5 steps with a railing? : Total 6 Click Score: 6    End of Session   Activity Tolerance: Patient limited by pain Patient  left: in bed;with call bell/phone within reach;with bed alarm set Nurse Communication: Mobility status PT Visit Diagnosis: Pain;Difficulty in walking, not elsewhere classified (R26.2);History of falling (Z91.81);Muscle weakness (generalized) (M62.81);Other abnormalities of gait and mobility (R26.89) Pain - Right/Left: Right Pain - part of body: Arm;Leg    Time: 9258-9243 PT Time Calculation (min) (ACUTE ONLY): 15 min   Charges:   PT Evaluation $PT Eval Moderate Complexity: 1 Mod   PT General Charges $$ ACUTE PT VISIT: 1 Visit         Sari MATSU., PT  Office # [phone]   Erven Sari Shaker 12/08/2023, 9:32 AM

## 2023-12-08 NOTE — TOC Progression Note (Addendum)
 Transition of Care [facility]) - Progression Note    Patient Details  Name: Christine [name] MRN: [id number] Date of Birth: 1933-12-07  Transition of Care [facility]) CM/SW Contact  Sharyne Drum, Student-Social Work Phone Number: 12/08/2023, 11:00 AM  Clinical Narrative:     MSW Student completed FL2 for patient placement at SNF. ICM will continue to follow, waiting to hear back from SNF's.                    Expected Discharge Plan and Services                                               Social Drivers of Health (SDOH) Interventions SDOH Screenings   Food Insecurity: No Food Insecurity (12/08/2023)  Housing: Unknown (12/08/2023)  Transportation Needs: Unmet Transportation Needs (12/08/2023)  Utilities: Not At Risk (12/08/2023)  Social Connections: Unknown (12/08/2023)  Tobacco Use: Low Risk  (12/07/2023)    Readmission Risk Interventions    11/26/2023    2:47 PM  Readmission Risk Prevention Plan  Transportation Screening Complete  HRI or Home Care Consult Complete  Social Work Consult for Recovery Care Planning/Counseling Complete  Palliative Care Screening Not Applicable  Medication Review Oceanographer) Complete

## 2023-12-08 NOTE — Consult Note (Signed)
 WOC Nurse Consult Note: Reason for Consult: sacral wound  Wound type:1  Unstageable Pressure injury sacrum 80% necrotic 20% red  2.  Stage 3 Pressure Injury B buttocks 60% red 40% tan  Pressure Injury POA: Yes  Measurement: see nursing flowsheet  Wound bed: as above  Drainage (amount, consistency, odor) tan exudate on old dressing  Periwound: significant erythema/stage 1 extending laterally onto buttocks  Dressing procedure/placement/frequency: Cleanse sacral and buttock wounds with Vashe, do not rinse.  Apply 1/4 thick layer of Santyl to wound bed, top with saline moist gauze, dry gauze and silicone foam.   Will write for a thin layer of Desitin to surrounding intact skin of buttocks.   POC discussed with bedside nurse. WOC team will not follow. Reconsult if further needs arise.   Thank you,    Powell Bar MSN, RN-BC, [employer]

## 2023-12-08 NOTE — Progress Notes (Signed)
 Consultation Progress Note   Patient: Christine [name] FMW:[id number] DOB: 04-30-1933 DOA: 12/07/2023 DOS: the patient was seen and examined on 12/08/2023 Primary service: Brendan Gruwell, Landon BRAVO, MD  Brief hospital course: Christine [name] is an 89 y.o. adult with medical history significant for dementia with behavioral disturbance, coronary artery disease, atrial fibrillation on Xarelto , recurrent UTIs, recurrent falls, and recent admission with urinary retention and bilateral hydronephrosis who now returns with inability to ambulate and increased confusion after another fall.   Patient was recent hospitalized, found to have urinary retention with bilateral hydronephrosis.  She had Foley catheter placed, failed a voiding trial, and was discharged with Foley.  PT had recommended that she be discharged to an SNF but she returned to her ALF at the daughter's request.  She fell again shortly after discharge, has been calling out in pain, and has been requiring two-person assist to ambulate now.  She is also reported to be more confused than usual.   ED Course: Upon arrival to the ED, patient is found to have Tmax of 37.9 C rectally with normal RR, normal HR, and stable BP.  Labs are notable for normal creatinine, albumin 2.7, normal WBC, lactic acid 2.2, and negative respiratory virus panel.  There is no acute findings on head CT.  CT of the chest, abdomen, and pelvis is notable for a nodule at the left apex, improved hydronephrosis, but no pneumonia or fractures.  Plain radiographs of the right shoulder, right elbow, right hand, bilateral feet are negative for acute fracture or dislocation.   Blood cultures were collected in the ED and the patient was given a liter of NS, IV Ativan , fentanyl , and azithromycin   Assessment and Plan: 1. Acute encephalopathy  - Presented with acute worsening of her confusion. - This morning daughter at bedside states that her confusion has improved and she is back to  baseline. -Suspect patient was dehydrated, pain could also cause her increased confusion  - No acute findings noted on head CT, no pneumonia or other infectious process noted on CT and UA is not suggestive of infection  - She appears to be in severe pain any time she is moved or her right arm is manipulated; imaging negative for fractures or dislocations  - Suspect the increased confusion may be d/t pain  - Use delirium precautions, schedule Tylenol , continue tramadol  if-needed, use low-dose fentanyl  as needed for severe pain     2. Dementia with behavioral disturbance  - Hold Aricept  given prolonged QT, continue Namenda , Seroquel , as-needed Ativan , and Depakote , use delirium precautions     3. Prolonged QT interval  - Supplement potassium, check magnesium  level, avoid QT-prolonging medications     4. PAF  - Continue diltiazem  and Xarelto      5. Urinary retention  - Hydronephrosis has improved on CT in ED, will continue Foley for now     6. Anemia  - Appears stable    7. Lung nodule  - Outpatient follow-up advised        TRH will continue to follow the patient.  Subjective: Seen at bedside this morning, discussed with daughter who states that she would like patient to go back to her assisted living facility however based on PT evaluation patient would need to go to a skilled nursing facility.  Case management consulted and working on placement.  Acute overnight events, patient was seen sleeping this morning, daughter states that she is back to her baseline.  She does have a known history of  dementia.  Physical Exam: Vitals:   12/07/23 2045 12/08/23 0034 12/08/23 0432 12/08/23 0819  BP: (!) 141/62 (!) 146/65 131/62 (!) 141/57  Pulse: 93 84 79 78  Resp: (!) 22 20  18   Temp:  97.6 F (36.4 C)  98.4 F (36.9 C)  TempSrc:  Oral  Oral  SpO2: 100% 99% 97% 100%   General Sleeping deeply in bed Eyes: PERRL, lids and conjunctivae normal ENMT: Mucous membranes are moist.    Neck: normal, supple, no masses, no thyromegaly Respiratory: clear to auscultation bilaterally, no wheezing, no crackles. Normal respiratory effort. No accessory muscle use.  Cardiovascular: Regular rate and rhythm, no murmurs / rubs / gallops Abdomen: Soft, nontender nondistended. Musculoskeletal: no clubbing / cyanosis. No joint deformity upper and lower extremities.  Skin: no rashes, lesions, ulcers. No induration Data Reviewed:    CBC    Component Value Date/Time   WBC 6.4 12/08/2023 0435   RBC 3.08 (L) 12/08/2023 0435   HGB 8.8 (L) 12/08/2023 0435   HCT 26.5 (L) 12/08/2023 0435   PLT 257 12/08/2023 0435   MCV 86.0 12/08/2023 0435   MCH 28.6 12/08/2023 0435   MCHC 33.2 12/08/2023 0435   RDW 14.8 12/08/2023 0435   LYMPHSABS 1.0 12/07/2023 1320   MONOABS 1.2 (H) 12/07/2023 1320   EOSABS 0.0 12/07/2023 1320   BASOSABS 0.0 12/07/2023 1320   CMP     Component Value Date/Time   NA 142 12/08/2023 0435   K 3.7 12/08/2023 0435   CL 104 12/08/2023 0435   CO2 22 12/08/2023 0435   GLUCOSE 105 (H) 12/08/2023 0435   BUN 20 12/08/2023 0435   CREATININE 0.67 12/08/2023 0435   CALCIUM  8.6 (L) 12/08/2023 0435   PROT 6.9 12/07/2023 1320   ALBUMIN 2.7 (L) 12/07/2023 1320   AST 22 12/07/2023 1320   ALT 12 12/07/2023 1320   ALKPHOS 64 12/07/2023 1320   BILITOT 0.8 12/07/2023 1320   GFRNONAA >60 12/08/2023 0435    Family Communication: Daughter at bedside  Time spent: 35 minutes.  Author: Landon FORBES Baller, MD 12/08/2023 8:51 AM  For on call review [email or web address].

## 2023-12-09 ENCOUNTER — Other Ambulatory Visit: Payer: Self-pay

## 2023-12-09 DIAGNOSIS — G9340 Encephalopathy, unspecified: Secondary | ICD-10-CM | POA: Diagnosis not present

## 2023-12-09 LAB — BASIC METABOLIC PANEL WITH GFR
Anion gap: 9 (ref 5–15)
BUN: 21 mg/dL (ref 8–23)
CO2: 23 mmol/L (ref 22–32)
Calcium: 8.1 mg/dL — ABNORMAL LOW (ref 8.9–10.3)
Chloride: 109 mmol/L (ref 98–111)
Creatinine, Ser: 0.7 mg/dL (ref 0.44–1.00)
GFR, Estimated: >60 mL/min (ref >60)
Glucose, Bld: 95 mg/dL (ref 70–99)
Potassium: 3.9 mmol/L (ref 3.5–5.1)
Sodium: 141 mmol/L (ref 135–145)

## 2023-12-09 LAB — CBC
HCT: 26.8 % — ABNORMAL LOW (ref 36.0–46.0)
Hemoglobin: 8.7 g/dL — ABNORMAL LOW (ref 12.0–15.0)
MCH: 28.4 pg (ref 26.0–34.0)
MCHC: 32.5 g/dL (ref 30.0–36.0)
MCV: 87.6 fL (ref 80.0–100.0)
Platelets: 236 K/uL (ref 150–400)
RBC: 3.06 MIL/uL — ABNORMAL LOW (ref 3.87–5.11)
RDW: 15.1 % (ref 11.5–15.5)
WBC: 4.2 K/uL (ref 4.0–10.5)
nRBC: 0 % (ref 0.0–0.2)

## 2023-12-09 NOTE — Plan of Care (Signed)
   Problem: Clinical Measurements: Goal: Ability to maintain clinical measurements within normal limits will improve Outcome: Progressing Goal: Will remain free from infection Outcome: Progressing

## 2023-12-09 NOTE — TOC Progression Note (Cosign Needed Addendum)
 Transition of Care [facility]) - Progression Note    Patient Details  Name: BIRDELL FRASIER MRN: [id number] Date of Birth: 1933/07/25  [facility]) CM/SW Contact  Sharyne Drum, Student-Social Work Phone Number: 12/09/2023, 11:15 AM  Clinical Narrative:     MSW Student called daughter to talk about SNF placements that have accepted the pt. Daughter did not answer and left voicemail. CSW will continue to follow up when there is response.   11:22 am: Daughter called back and was notified of SNF acceptances. Daughter asked if we had heard from [facility] as they are interested in that facility due to proximity to her home. CSW had not received decision yet from [facility]. MSW Student called and left voicemail for Randine at [facility] to review pt referral. CSW will continue to follow up once we hear back from [facility].                    Expected Discharge Plan and Services                                               Social Drivers of Health (SDOH) Interventions SDOH Screenings   Food Insecurity: No Food Insecurity (12/08/2023)  Housing: Unknown (12/08/2023)  Transportation Needs: Unmet Transportation Needs (12/08/2023)  Utilities: Not At Risk (12/08/2023)  Social Connections: Unknown (12/08/2023)  Tobacco Use: Low Risk  (12/07/2023)    Readmission Risk Interventions    11/26/2023    2:47 PM  Readmission Risk Prevention Plan  Transportation Screening Complete  HRI or Home Care Consult Complete  Social Work Consult for Recovery Care Planning/Counseling Complete  Palliative Care Screening Not Applicable  Medication Review Oceanographer) Complete

## 2023-12-09 NOTE — Progress Notes (Signed)
 PROGRESS NOTE    Christine [name]  FMW:[id number] DOB: Aug 18, 1933 DOA: 12/07/2023 PCP: Shayne Anes, MD  Subjective: No acute overnight events, patient resting and states she feels okay. Has pain in the right hand but the swelling is getting better.     Hospital Course: 89 y.o. adult with medical history significant for dementia with behavioral disturbance, coronary artery disease, atrial fibrillation on Xarelto , recurrent UTIs, recurrent falls, and recent admission with urinary retention and bilateral hydronephrosis who now returns with inability to ambulate and increased confusion after another fall.  Patient was recent hospitalized, found to have urinary retention with bilateral hydronephrosis. She had Foley catheter placed, failed a voiding trial, and was discharged with Foley. PT had recommended that she be discharged to an SNF but she returned to her ALF at the daughter's request. She fell again shortly after discharge, has been calling out in pain, and has been requiring two-person assist to ambulate now.    Assessment and Plan:  1. Acute encephalopathy  - Presented with acute worsening of her confusion initially, but per daughter patient is back to her baseline now  - confusion most likely from dehydration and pain in her right arm, less likely to be infectious related. Right hand xrays negative for fracture, showed OA  - continue delirium precautions - pain control with scheduled tylenol  and tramadol . Avoiding fentanyl  in the elderly, if IV pain med needed for pain can consider lowest dose of dilaudid     2. Dementia with behavioral disturbance  - Hold Aricept  given prolonged QT, continue Namenda , Seroquel , as-needed Ativan , and Depakote , use delirium precautions     3. Prolonged QT interval  - avoid QT-prolonging medications     4. PAF  - Continue diltiazem  and Xarelto      5. Urinary retention  - Hydronephrosis has improved on CT in ED, will continue Foley for now     6.  Anemia  - Appears stable    7. Lung nodule  - Outpatient follow-up advised      DVT prophylaxis:  Rivaroxaban  (XARELTO ) tablet 15 mg     Code Status: Limited: Do not attempt resuscitation (DNR) -DNR-LIMITED -Do Not Intubate/DNI  Family Communication: daughter at bedside Disposition Plan: SNF. Daughter asking if she can return to her ALF with increased services, explained her current level of need is more than ALF and she will discuss with [facility] CM/SW Reason for continuing need for hospitalization: Med ready   Objective: Vitals:   12/08/23 1957 12/08/23 2354 12/09/23 0700 12/09/23 1225  BP: (!) 111/49 (!) 103/53 122/72 115/76  Pulse: 75 83 82 80  Resp: 18 18 18 18   Temp: 98.3 F (36.8 C) 98.4 F (36.9 C) 98.1 F (36.7 C) 98 F (36.7 C)  TempSrc: Oral Oral Oral   SpO2: 97% 98% 97% 92%    Intake/Output Summary (Last 24 hours) at 12/09/2023 1241 Last data filed at 12/09/2023 0800 Gross per 24 hour  Intake 60 ml  Output 300 ml  Net -240 ml   There were no vitals filed for this visit.  Examination:  Physical Exam Vitals and nursing note reviewed.  Constitutional:      General: She is not in acute distress. Cardiovascular:     Rate and Rhythm: Normal rate.  Pulmonary:     Effort: No respiratory distress.     Breath sounds: No wheezing.  Abdominal:     General: There is no distension.     Tenderness: There is no abdominal tenderness.  Musculoskeletal:  Comments: Mild to moderate swelling of right forearm/hand, ROM limited due to pain      Data Reviewed: I have personally reviewed following labs and imaging studies  CBC: Recent Labs  Lab 12/07/23 1320 12/07/23 1341 12/08/23 0435 12/09/23 0445  WBC 8.2  --  6.4 4.2  NEUTROABS 6.0  --   --   --   HGB 9.9* 9.9* 8.8* 8.7*  HCT 30.7* 29.0* 26.5* 26.8*  MCV 86.7  --  86.0 87.6  PLT 278  --  257 236   Basic Metabolic Panel: Recent Labs  Lab 12/07/23 1320 12/07/23 1341 12/08/23 0435 12/09/23 0445  NA  138 140 142 141  K 3.8 3.9 3.7 3.9  CL 103 104 104 109  CO2 23  --  22 23  GLUCOSE 98 100* 105* 95  BUN 22 24* 20 21  CREATININE 0.85 0.80 0.67 0.70  CALCIUM  8.9  --  8.6* 8.1*  MG  --   --  1.9  --   PHOS  --   --  4.0  --    GFR: Estimated Creatinine Clearance (by C-G formula based on SCr of 0.7 mg/dL) Female: 42 mL/min Female: 52.5 mL/min Liver Function Tests: Recent Labs  Lab 12/07/23 1320  AST 22  ALT 12  ALKPHOS 64  BILITOT 0.8  PROT 6.9  ALBUMIN 2.7*   No results for input(s): LIPASE, AMYLASE in the last 168 hours. No results for input(s): AMMONIA in the last 168 hours. Coagulation Profile: No results for input(s): INR, PROTIME in the last 168 hours. Cardiac Enzymes: No results for input(s): CKTOTAL, CKMB, CKMBINDEX, TROPONINI in the last 168 hours. ProBNP, BNP (last 5 results) No results for input(s): PROBNP, BNP in the last 8760 hours. HbA1C: No results for input(s): HGBA1C in the last 72 hours. CBG: No results for input(s): GLUCAP in the last 168 hours. Lipid Profile: No results for input(s): CHOL, HDL, LDLCALC, TRIG, CHOLHDL, LDLDIRECT in the last 72 hours. Thyroid  Function Tests: No results for input(s): TSH, T4TOTAL, FREET4, T3FREE, THYROIDAB in the last 72 hours. Anemia Panel: No results for input(s): VITAMINB12, FOLATE, FERRITIN, TIBC, IRON, RETICCTPCT in the last 72 hours. Sepsis Labs: Recent Labs  Lab 12/07/23 1341 12/07/23 1611  LATICACIDVEN 2.2* 0.9    Recent Results (from the past 240 hours)  MRSA Next Gen by PCR, Nasal     Status: None   Collection Time: 11/29/23  1:01 PM   Specimen: Nasal Mucosa; Nasal Swab  Result Value Ref Range Status   MRSA by PCR Next Gen NOT DETECTED NOT DETECTED Final    Comment: (NOTE) The GeneXpert MRSA Assay (FDA approved for NASAL specimens only), is one component of a comprehensive MRSA colonization surveillance program. It is not intended to  diagnose MRSA infection nor to guide or monitor treatment for MRSA infections. Test performance is not FDA approved in patients less than 12 years old. Performed at [facility], [address] [place], [place] [address]   Resp panel by RT-PCR (RSV, Flu A&B, Covid) Anterior Nasal Swab     Status: None   Collection Time: 12/07/23 11:11 AM   Specimen: Anterior Nasal Swab  Result Value Ref Range Status   SARS Coronavirus 2 by RT PCR NEGATIVE NEGATIVE Final   Influenza A by PCR NEGATIVE NEGATIVE Final   Influenza B by PCR NEGATIVE NEGATIVE Final    Comment: (NOTE) The Xpert Xpress SARS-CoV-2/FLU/RSV plus assay is intended as an aid in the diagnosis of influenza  from Nasopharyngeal swab specimens and should not be used as a sole basis for treatment. Nasal washings and aspirates are unacceptable for Xpert Xpress SARS-CoV-2/FLU/RSV testing.  Fact Sheet for Patients: bloggercourse.com  Fact Sheet for Healthcare Providers: seriousbroker.it  This test is not yet approved or cleared by the [place]  FDA and has been authorized for detection and/or diagnosis of SARS-CoV-2 by FDA under an Emergency Use Authorization (EUA). This EUA will remain in effect (meaning this test can be used) for the duration of the COVID-19 declaration under Section 564(b)(1) of the Act, 21 U.S.C. section 360bbb-3(b)(1), unless the authorization is terminated or revoked.     Resp Syncytial Virus by PCR NEGATIVE NEGATIVE Final    Comment: (NOTE) Fact Sheet for Patients: bloggercourse.com  Fact Sheet for Healthcare Providers: seriousbroker.it  This test is not yet approved or cleared by the [place]  FDA and has been authorized for detection and/or diagnosis of SARS-CoV-2 by FDA under an Emergency Use Authorization (EUA). This EUA will remain in effect (meaning this test can be used)  for the duration of the COVID-19 declaration under Section 564(b)(1) of the Act, 21 U.S.C. section 360bbb-3(b)(1), unless the authorization is terminated or revoked.  Performed at [facility], [address] [place], [place] [address]   Culture, blood (routine x 2)     Status: None (Preliminary result)   Collection Time: 12/07/23  1:28 PM   Specimen: BLOOD RIGHT HAND  Result Value Ref Range Status   Specimen Description BLOOD RIGHT HAND  Final   Special Requests   Final    BOTTLES DRAWN AEROBIC ONLY Blood Culture results may not be optimal due to an inadequate volume of blood received in culture bottles   Culture   Final    NO GROWTH 2 DAYS Performed at [facility], [address], [place], [place] [address]    Report Status PENDING  Incomplete  Culture, blood (routine x 2)     Status: None (Preliminary result)   Collection Time: 12/07/23  1:28 PM   Specimen: BLOOD LEFT ARM  Result Value Ref Range Status   Specimen Description BLOOD LEFT ARM  Final   Special Requests   Final    BOTTLES DRAWN AEROBIC AND ANAEROBIC Blood Culture adequate volume   Culture   Final    NO GROWTH 2 DAYS Performed at [facility], [address], [place], [place] [address]    Report Status PENDING  Incomplete     Radiology Studies: DG Hand 2 View Right Result Date: 12/07/2023 CLINICAL DATA:  Fall.  Right hand pain. EXAM: RIGHT HAND - 2 VIEW COMPARISON:  None Available. FINDINGS: There is no evidence of acute fracture or dislocation. Severe osteoarthritis of the interphalangeal joints of the second through fifth fingers. No bony lesions or destruction. Soft tissues are unremarkable. IMPRESSION: No acute findings. Severe osteoarthritis of the interphalangeal joints of the second through fifth fingers. Electronically Signed   By: Marcey Moan M.D.   On: 12/07/2023 18:05   CT CHEST ABDOMEN PELVIS W CONTRAST Result Date: 12/07/2023 CLINICAL DATA:  Blunt trauma. EXAM: CT CHEST,  ABDOMEN, AND PELVIS WITH CONTRAST TECHNIQUE: Multidetector CT imaging of the chest, abdomen and pelvis was performed following the standard protocol during bolus administration of intravenous contrast. RADIATION DOSE REDUCTION: This exam was performed according to the departmental dose-optimization program which includes automated exposure control, adjustment of the mA and/or kV according to patient size and/or use of iterative reconstruction technique. CONTRAST:  75mL OMNIPAQUE  IOHEXOL  350 MG/ML SOLN COMPARISON:  Abdominopelvic CT 11/24/2023 FINDINGS: CT CHEST FINDINGS Cardiovascular: No evidence of acute aortic or vascular injury. Aortic atherosclerosis. Upper normal heart size. There are coronary artery calcifications. No pericardial effusion. Mediastinum/Nodes: No mediastinal hemorrhage. No adenopathy. Unremarkable esophagus. No visible thyroid  nodule. Lungs/Pleura: No pneumothorax. Dependent atelectasis in both lower lobes, left greater than right. Trace left pleural thickening. Benign calcified granuloma in the left lower lobe. Irregular part solid left apical pulmonary nodule with ill-defined margins. Solid component measures approximately 7 x 5 x 10 mm, series 4, image 35. Breathing motion artifact. Musculoskeletal: No acute fracture of the ribs, included clavicles or shoulder girdles. Mild scoliotic curvature of the thoracic spine, no acute fracture. CT ABDOMEN PELVIS FINDINGS Hepatobiliary: No hepatic injury or perihepatic hematoma. Scattered hepatic cysts, stable from prior exam. Gallbladder is distended without inflammation. Pancreas: No evidence of injury. No ductal dilatation or inflammation. Spleen: No splenic injury or perisplenic hematoma. Adrenals/Urinary Tract: No adrenal hemorrhage or renal injury identified. Improved hydronephrosis from prior exam with persistent dilatation of the renal collecting systems. Improved hydroureter, near completely resolved. Redemonstrated right renal cyst.  Nonobstructing left renal calculi. No further follow-up imaging is recommended. Foley catheter within the urinary bladder which is only minimally distended. Air in the bladder typically related to Foley. Stomach/Bowel: No evidence of bowel injury. No mesenteric hematoma. No bowel wall thickening or inflammation. Colonic diverticulosis without diverticulitis. Moderate colonic stool burden. Normal appendix. Vascular/Lymphatic: No vascular injury. No retroperitoneal fluid. Abdominal aortic atherosclerosis. No suspicious lymphadenopathy. Reproductive: Status post hysterectomy. No adnexal masses. Other: No free fluid or free air. Musculoskeletal: Left hip arthroplasty creates regional streak artifact. Degenerative change in the lumbar spine without acute fracture. No pelvic fracture, with special attention to the left pubic bone with questionable lucency on radiograph. IMPRESSION: 1. No evidence of acute traumatic injury to the chest, abdomen, or pelvis. 2. Irregular part solid left apical pulmonary nodule with ill-defined margins. Solid component measures approximately 7 x 5 x 10 mm. Differential considerations include scarring or neoplasm. Recommend elective pulmonary consultation. 3. Improved hydronephrosis from prior exam with persistent dilatation of the renal collecting systems. Improved hydroureter, near completely resolved. 4. Colonic diverticulosis without diverticulitis. Aortic Atherosclerosis (ICD10-I70.0). Electronically Signed   By: Andrea Gasman M.D.   On: 12/07/2023 16:09    Scheduled Meds:  acetaminophen   500 mg Oral Q6H   Chlorhexidine  Gluconate Cloth  6 each Topical Daily   collagenase  1 Application Topical Daily   diltiazem   120 mg Oral Daily   liver oil-zinc oxide   Topical BID   memantine   5 mg Oral Daily   pantoprazole   40 mg Oral Daily   potassium chloride   10 mEq Oral Once   QUEtiapine   25 mg Oral QHS   Rivaroxaban   15 mg Oral QPM   sodium chloride  flush  3 mL Intravenous Q12H    Continuous Infusions:   LOS: 0 days   Time spent: 36 minutes  Casimer Dare, MD  [facility]  12/09/2023, 12:41 PM

## 2023-12-09 NOTE — Plan of Care (Signed)

## 2023-12-10 DIAGNOSIS — G9340 Encephalopathy, unspecified: Secondary | ICD-10-CM | POA: Diagnosis not present

## 2023-12-10 LAB — CBC
HCT: 26.4 % — ABNORMAL LOW (ref 36.0–46.0)
Hemoglobin: 8.5 g/dL — ABNORMAL LOW (ref 12.0–15.0)
MCH: 28 pg (ref 26.0–34.0)
MCHC: 32.2 g/dL (ref 30.0–36.0)
MCV: 86.8 fL (ref 80.0–100.0)
Platelets: 233 K/uL (ref 150–400)
RBC: 3.04 MIL/uL — ABNORMAL LOW (ref 3.87–5.11)
RDW: 15.1 % (ref 11.5–15.5)
WBC: 4.1 K/uL (ref 4.0–10.5)
nRBC: 0 % (ref 0.0–0.2)

## 2023-12-10 LAB — BASIC METABOLIC PANEL WITH GFR
Anion gap: 11 (ref 5–15)
BUN: 17 mg/dL (ref 8–23)
CO2: 24 mmol/L (ref 22–32)
Calcium: 8.3 mg/dL — ABNORMAL LOW (ref 8.9–10.3)
Chloride: 106 mmol/L (ref 98–111)
Creatinine, Ser: 0.76 mg/dL (ref 0.44–1.00)
GFR, Estimated: >60 mL/min (ref >60)
Glucose, Bld: 101 mg/dL — ABNORMAL HIGH (ref 70–99)
Potassium: 3.7 mmol/L (ref 3.5–5.1)
Sodium: 141 mmol/L (ref 135–145)

## 2023-12-10 NOTE — Plan of Care (Signed)

## 2023-12-10 NOTE — Progress Notes (Signed)
 PROGRESS NOTE    Christine [name]  FMW:[id number] DOB: May 11, 1933 DOA: 12/07/2023 PCP: Shayne Anes, MD  Subjective: Patient reports feeling better, the pain in her right wrist/hand is better     Hospital Course: 89 y.o. adult with medical history significant for dementia with behavioral disturbance, coronary artery disease, atrial fibrillation on Xarelto , recurrent UTIs, recurrent falls, and recent admission with urinary retention and bilateral hydronephrosis who now returns with inability to ambulate and increased confusion after another fall.  Patient was recent hospitalized, found to have urinary retention with bilateral hydronephrosis. She had Foley catheter placed, failed a voiding trial, and was discharged with Foley. PT had recommended that she be discharged to an SNF but she returned to her ALF at the daughter's request. She fell again shortly after discharge, PT recommending SNF-R   Assessment and Plan:  1. Acute encephalopathy  - Presented with acute worsening of her confusion initially, but per daughter patient is back to her baseline now  - confusion most likely from dehydration and pain in her right arm/hand, less likely to be infectious related. Right hand xrays negative for fracture, showed OA  - continue delirium precautions - pain control with scheduled tylenol  and PRN tramadol , hasn't needed any. Avoiding fentanyl  in the elderly, if IV pain med needed for pain can consider lowest dose of dilaudid    2. Dementia with behavioral disturbance  - Hold Aricept  given prolonged QT, continue Namenda , Seroquel , as-needed Ativan , and Depakote , use delirium precautions     3. Prolonged QT interval  - avoid QT-prolonging medications     4. PAF  - Continue diltiazem  and Xarelto      5. Urinary retention  - Hydronephrosis has improved on CT in ED, will continue Foley for now   - follow up with urology as outpatient    6. Anemia  - Appears stable    7. Lung nodule  - Outpatient  follow-up advised    DVT prophylaxis:  Rivaroxaban  (XARELTO ) tablet 15 mg     Code Status: Limited: Do not attempt resuscitation (DNR) -DNR-LIMITED -Do Not Intubate/DNI  Family Communication: Daughter at bedside  Disposition Plan: SNF Reason for continuing need for hospitalization: Med ready   Objective: Vitals:   12/09/23 1950 12/10/23 0033 12/10/23 0459 12/10/23 0730  BP: (!) 120/50 (!) 127/48 (!) 113/44 (!) 120/52  Pulse: 84 84 72 74  Resp: 20 18 20 18   Temp: 98.2 F (36.8 C) 98 F (36.7 C) 98.7 F (37.1 C) 97.8 F (36.6 C)  TempSrc: Oral Oral Oral   SpO2: 97% 98% 97% 98%    Intake/Output Summary (Last 24 hours) at 12/10/2023 1310 Last data filed at 12/09/2023 2200 Gross per 24 hour  Intake --  Output 450 ml  Net -450 ml   There were no vitals filed for this visit.  Examination:  Physical Exam Vitals and nursing note reviewed.  Constitutional:      General: She is not in acute distress. Cardiovascular:     Rate and Rhythm: Normal rate.  Pulmonary:     Effort: No respiratory distress.  Abdominal:     General: There is no distension.     Tenderness: There is no abdominal tenderness.  Musculoskeletal:     Comments: Right wrist with improved ROM, less tender. Fingers with swelling but also improving      Data Reviewed: I have personally reviewed following labs and imaging studies  CBC: Recent Labs  Lab 12/07/23 1320 12/07/23 1341 12/08/23 0435 12/09/23 0445 12/10/23 0546  WBC 8.2  --  6.4 4.2 4.1  NEUTROABS 6.0  --   --   --   --   HGB 9.9* 9.9* 8.8* 8.7* 8.5*  HCT 30.7* 29.0* 26.5* 26.8* 26.4*  MCV 86.7  --  86.0 87.6 86.8  PLT 278  --  257 236 233   Basic Metabolic Panel: Recent Labs  Lab 12/07/23 1320 12/07/23 1341 12/08/23 0435 12/09/23 0445 12/10/23 0546  NA 138 140 142 141 141  K 3.8 3.9 3.7 3.9 3.7  CL 103 104 104 109 106  CO2 23  --  22 23 24   GLUCOSE 98 100* 105* 95 101*  BUN 22 24* 20 21 17   CREATININE 0.85 0.80 0.67 0.70  0.76  CALCIUM  8.9  --  8.6* 8.1* 8.3*  MG  --   --  1.9  --   --   PHOS  --   --  4.0  --   --    GFR: CrCl cannot be calculated (Unknown ideal weight.). Liver Function Tests: Recent Labs  Lab 12/07/23 1320  AST 22  ALT 12  ALKPHOS 64  BILITOT 0.8  PROT 6.9  ALBUMIN 2.7*   No results for input(s): LIPASE, AMYLASE in the last 168 hours. No results for input(s): AMMONIA in the last 168 hours. Coagulation Profile: No results for input(s): INR, PROTIME in the last 168 hours. Cardiac Enzymes: No results for input(s): CKTOTAL, CKMB, CKMBINDEX, TROPONINI in the last 168 hours. ProBNP, BNP (last 5 results) No results for input(s): PROBNP, BNP in the last 8760 hours. HbA1C: No results for input(s): HGBA1C in the last 72 hours. CBG: No results for input(s): GLUCAP in the last 168 hours. Lipid Profile: No results for input(s): CHOL, HDL, LDLCALC, TRIG, CHOLHDL, LDLDIRECT in the last 72 hours. Thyroid  Function Tests: No results for input(s): TSH, T4TOTAL, FREET4, T3FREE, THYROIDAB in the last 72 hours. Anemia Panel: No results for input(s): VITAMINB12, FOLATE, FERRITIN, TIBC, IRON, RETICCTPCT in the last 72 hours. Sepsis Labs: Recent Labs  Lab 12/07/23 1341 12/07/23 1611  LATICACIDVEN 2.2* 0.9    Recent Results (from the past 240 hours)  Resp panel by RT-PCR (RSV, Flu A&B, Covid) Anterior Nasal Swab     Status: None   Collection Time: 12/07/23 11:11 AM   Specimen: Anterior Nasal Swab  Result Value Ref Range Status   SARS Coronavirus 2 by RT PCR NEGATIVE NEGATIVE Final   Influenza A by PCR NEGATIVE NEGATIVE Final   Influenza B by PCR NEGATIVE NEGATIVE Final    Comment: (NOTE) The Xpert Xpress SARS-CoV-2/FLU/RSV plus assay is intended as an aid in the diagnosis of influenza from Nasopharyngeal swab specimens and should not be used as a sole basis for treatment. Nasal washings and aspirates are unacceptable for Xpert  Xpress SARS-CoV-2/FLU/RSV testing.  Fact Sheet for Patients: bloggercourse.com  Fact Sheet for Healthcare Providers: seriousbroker.it  This test is not yet approved or cleared by the [place]  FDA and has been authorized for detection and/or diagnosis of SARS-CoV-2 by FDA under an Emergency Use Authorization (EUA). This EUA will remain in effect (meaning this test can be used) for the duration of the COVID-19 declaration under Section 564(b)(1) of the Act, 21 U.S.C. section 360bbb-3(b)(1), unless the authorization is terminated or revoked.     Resp Syncytial Virus by PCR NEGATIVE NEGATIVE Final    Comment: (NOTE) Fact Sheet for Patients: bloggercourse.com  Fact Sheet for Healthcare Providers: seriousbroker.it  This test is not yet approved or cleared by  the [place]  FDA and has been authorized for detection and/or diagnosis of SARS-CoV-2 by FDA under an Emergency Use Authorization (EUA). This EUA will remain in effect (meaning this test can be used) for the duration of the COVID-19 declaration under Section 564(b)(1) of the Act, 21 U.S.C. section 360bbb-3(b)(1), unless the authorization is terminated or revoked.  Performed at [facility], [address], [place], [place] [address]   Culture, blood (routine x 2)     Status: None (Preliminary result)   Collection Time: 12/07/23  1:28 PM   Specimen: BLOOD RIGHT HAND  Result Value Ref Range Status   Specimen Description BLOOD RIGHT HAND  Final   Special Requests   Final    BOTTLES DRAWN AEROBIC ONLY Blood Culture results may not be optimal due to an inadequate volume of blood received in culture bottles   Culture   Final    NO GROWTH 3 DAYS Performed at [facility], [address], [place], [place] [address]    Report Status PENDING  Incomplete  Culture, blood (routine x 2)     Status: None (Preliminary  result)   Collection Time: 12/07/23  1:28 PM   Specimen: BLOOD LEFT ARM  Result Value Ref Range Status   Specimen Description BLOOD LEFT ARM  Final   Special Requests   Final    BOTTLES DRAWN AEROBIC AND ANAEROBIC Blood Culture adequate volume   Culture   Final    NO GROWTH 3 DAYS Performed at [facility], [address] [place], [place] [address]    Report Status PENDING  Incomplete     Radiology Studies: No results found.  Scheduled Meds:  acetaminophen   500 mg Oral Q6H   Chlorhexidine  Gluconate Cloth  6 each Topical Daily   collagenase   1 Application Topical Daily   diltiazem   120 mg Oral Daily   liver oil-zinc  oxide   Topical BID   memantine   5 mg Oral Daily   pantoprazole   40 mg Oral Daily   potassium chloride   10 mEq Oral Once   QUEtiapine   25 mg Oral QHS   Rivaroxaban   15 mg Oral QPM   sodium chloride  flush  3 mL Intravenous Q12H   Continuous Infusions:   LOS: 0 days   Time spent: 36 minutes  Casimer Dare, MD  [facility]  12/10/2023, 1:10 PM

## 2023-12-10 NOTE — TOC Initial Note (Signed)
 Transition of Care [facility]) - Initial/Assessment Note    Patient Details  Name: Christine [name] MRN: [id number] Date of Birth: 1933-10-05  Transition of Care [facility]) CM/SW Contact:    Sherline Clack, LCSWA Phone Number: 12/10/2023, 4:29 PM  Clinical Narrative:                  CSW reached out to [facility] at daughter's request. Glenys with [facility] would like to come evaluate patient tomorrow or Monday morning (12/8) before making a bed offer. [facility] is family's preferred facility due to proximity to their home. CSW will continue to follow.   Expected Discharge Plan: Skilled Nursing Facility Barriers to Discharge: Insurance Authorization, SNF Pending bed offer   Patient Goals and CMS Choice Patient states their goals for this hospitalization and ongoing recovery are:: Return to [facility]   Choice offered to / list presented to : Adult Children      Expected Discharge Plan and Services                                              Prior Living Arrangements/Services                       Activities of Daily Living   ADL Screening (condition at time of admission) Independently performs ADLs?: No Does the patient have a NEW difficulty with bathing/dressing/toileting/self-feeding that is expected to last >3 days?: Yes (Initiates electronic notice to provider for possible OT consult) Does the patient have a NEW difficulty with getting in/out of bed, walking, or climbing stairs that is expected to last >3 days?: Yes (Initiates electronic notice to provider for possible PT consult) Does the patient have a NEW difficulty with communication that is expected to last >3 days?: No Is the patient deaf or have difficulty hearing?: Yes Does the patient have difficulty seeing, even when wearing glasses/contacts?: No Does the patient have difficulty concentrating, remembering, or making decisions?: Yes  Permission Sought/Granted                   Emotional Assessment Appearance:: Appears stated age Attitude/Demeanor/Rapport: Unable to Assess Affect (typically observed): Unable to Assess Orientation: : Oriented to Self      Admission diagnosis:  Acute encephalopathy [G93.40] Acute encephalopathy due to infection [G93.49, B99.9] Patient Active Problem List   Diagnosis Date Noted   Nodule of apex of left lung 12/07/2023   Prolonged QT interval 12/07/2023   Acute urinary retention 11/24/2023   Protein-calorie malnutrition, severe 11/08/2023   Syncope 01/30/2022   Syncope and collapse 01/30/2022   Malnutrition of moderate degree 03/15/2021   Vitamin B12 deficiency 03/14/2021   Dyslipidemia 03/14/2021   Recurrent falls 03/14/2021   NSTEMI (non-ST elevated myocardial infarction) (HCC) 03/13/2021   History of kidney stones 10/23/2020   Dementia without behavioral disturbance (HCC) 08/30/2020   Pain in right knee 08/30/2020   Vitamin D  deficiency 08/30/2020   Bilateral impacted cerumen 08/22/2019   Thrombophilia 03/02/2019   Increased frequency of urination 10/14/2017   Impacted cerumen 08/04/2016   Age-related osteoporosis without current pathological fracture 07/06/2016   Hip fracture (HCC) 05/29/2016   Closed left hip fracture (HCC) 05/27/2016   Insomnia 12/17/2015   Memory loss 12/17/2015   Dysuria 12/04/2015   AKI (acute kidney injury) 10/01/2015   Acute encephalopathy 10/01/2015   Interstitial cystitis 10/01/2015  Essential hypertension 10/01/2015   Rhabdomyolysis 09/30/2015   Major depression, single episode 07/10/2015   Non-thrombocytopenic purpura 07/10/2015   Encounter for general adult medical examination without abnormal findings 11/30/2014   Fall 08/07/2013   Pain in left leg 08/07/2013   Lower urinary tract infectious disease 07/16/2013   Paroxysmal atrial fibrillation (HCC) 07/16/2013   Chest pain 07/16/2013   Transient ischemic attack 07/04/2012   Chronic pain syndrome 03/28/2012   Residual  hemorrhoidal skin tags 01/18/2012   Abdominal pain 02/03/2010   Pain in limb 10/07/2009   Anemia 03/26/2009   Allergic rhinitis 02/27/2009   Anxiety disorder 02/27/2009   Hyperlipidemia 02/27/2009   Low compliance bladder 02/27/2009   DYSPEPSIA 01/18/2008   ADENOMATOUS COLONIC POLYP 01/17/2008   HEMORRHOIDS, INTERNAL 01/17/2008   ESOPHAGITIS 01/17/2008   DIVERTICULOSIS, COLON 01/17/2008   HEMORRHOIDS-EXTERNAL 12/07/2007   GERD 12/07/2007   Constipation 12/07/2007   RECTAL BLEEDING 12/07/2007   Loss of weight 12/07/2007   Nausea alone 12/07/2007   HEARTBURN 12/07/2007   ABDOMINAL BLOATING 12/07/2007   History of colonic polyps 12/07/2007   PCP:  Shayne Anes, MD Pharmacy:  No Pharmacies Listed    Social Drivers of Health (SDOH) Social History: SDOH Screenings   Food Insecurity: No Food Insecurity (12/08/2023)  Housing: Unknown (12/08/2023)  Transportation Needs: Unmet Transportation Needs (12/08/2023)  Utilities: Not At Risk (12/08/2023)  Social Connections: Unknown (12/08/2023)  Tobacco Use: Low Risk  (12/07/2023)   SDOH Interventions:     Readmission Risk Interventions    11/26/2023    2:47 PM  Readmission Risk Prevention Plan  Transportation Screening Complete  HRI or Home Care Consult Complete  Social Work Consult for Recovery Care Planning/Counseling Complete  Palliative Care Screening Not Applicable  Medication Review [facility]) Complete

## 2023-12-10 NOTE — Plan of Care (Signed)
  Problem: Health Behavior/Discharge Planning: Goal: Ability to manage health-related needs will improve Outcome: Progressing   Problem: Clinical Measurements: Goal: Ability to maintain clinical measurements within normal limits will improve Outcome: Progressing Goal: Will remain free from infection Outcome: Progressing Goal: Diagnostic test results will improve Outcome: Progressing Goal: Respiratory complications will improve Outcome: Progressing Goal: Cardiovascular complication will be avoided Outcome: Progressing   Problem: Coping: Goal: Level of anxiety will decrease Outcome: Progressing   Problem: Elimination: Goal: Will not experience complications related to bowel motility Outcome: Progressing Goal: Will not experience complications related to urinary retention Outcome: Progressing   Problem: Pain Managment: Goal: General experience of comfort will improve and/or be controlled Outcome: Progressing

## 2023-12-10 NOTE — Hospital Course (Signed)
 89 y.o. adult with medical history significant for dementia with behavioral disturbance, coronary artery disease, atrial fibrillation on Xarelto , recurrent UTIs, recurrent falls, and recent admission with urinary retention and bilateral hydronephrosis who now returns with inability to ambulate and increased confusion after another fall.  Patient was recent hospitalized, found to have urinary retention with bilateral hydronephrosis. She had Foley catheter placed, failed a voiding trial, and was discharged with Foley. PT had recommended that she be discharged to an SNF but she returned to her ALF at the daughter's request. She fell again shortly after discharge, PT recommending SNF-R

## 2023-12-11 DIAGNOSIS — G9340 Encephalopathy, unspecified: Secondary | ICD-10-CM | POA: Diagnosis not present

## 2023-12-11 LAB — BASIC METABOLIC PANEL WITH GFR
Anion gap: 7 (ref 5–15)
BUN: 16 mg/dL (ref 8–23)
CO2: 23 mmol/L (ref 22–32)
Calcium: 8.1 mg/dL — ABNORMAL LOW (ref 8.9–10.3)
Chloride: 109 mmol/L (ref 98–111)
Creatinine, Ser: 0.69 mg/dL (ref 0.44–1.00)
GFR, Estimated: >60 mL/min (ref >60)
Glucose, Bld: 92 mg/dL (ref 70–99)
Potassium: 4 mmol/L (ref 3.5–5.1)
Sodium: 139 mmol/L (ref 135–145)

## 2023-12-11 LAB — CBC
HCT: 27.8 % — ABNORMAL LOW (ref 36.0–46.0)
Hemoglobin: 9 g/dL — ABNORMAL LOW (ref 12.0–15.0)
MCH: 28.2 pg (ref 26.0–34.0)
MCHC: 32.4 g/dL (ref 30.0–36.0)
MCV: 87.1 fL (ref 80.0–100.0)
Platelets: 226 K/uL (ref 150–400)
RBC: 3.19 MIL/uL — ABNORMAL LOW (ref 3.87–5.11)
RDW: 15.2 % (ref 11.5–15.5)
WBC: 3.4 K/uL — ABNORMAL LOW (ref 4.0–10.5)
nRBC: 0 % (ref 0.0–0.2)

## 2023-12-11 NOTE — Plan of Care (Signed)

## 2023-12-11 NOTE — Progress Notes (Signed)
 PROGRESS NOTE    HARLEM THRESHER  FMW:[id number] DOB: 05/02/1933 DOA: 12/07/2023 PCP: Shayne Anes, MD  Subjective: Patient's daughter reports her right hand is even better today.     Hospital Course: 89 y.o. adult with medical history significant for dementia with behavioral disturbance, coronary artery disease, atrial fibrillation on Xarelto , recurrent UTIs, recurrent falls, and recent admission with urinary retention and bilateral hydronephrosis who now returns with inability to ambulate and increased confusion after another fall.  Patient was recent hospitalized, found to have urinary retention with bilateral hydronephrosis. She had Foley catheter placed, failed a voiding trial, and was discharged with Foley. PT had recommended that she be discharged to an SNF but she returned to her ALF at the daughter's request. She fell again shortly after discharge, PT recommending SNF-R   Assessment and Plan:  1. Acute encephalopathy  - Presented with acute worsening of her confusion initially, but per daughter patient is back to her baseline now  - confusion most likely from dehydration and pain in her right arm/hand, less likely to be infectious related. Right hand xrays negative for fracture, showed OA  - continue delirium precautions - pain control with scheduled tylenol  and PRN tramadol , hasn't needed any. Avoiding fentanyl  in the elderly, if IV pain med needed for pain can consider lowest dose of dilaudid    2. Dementia with behavioral disturbance  - Hold Aricept  given prolonged QT, continue Namenda , Seroquel , as-needed Ativan , and Depakote , use delirium precautions     3. Prolonged QT interval  - avoid QT-prolonging medications     4. PAF  - Continue diltiazem  and Xarelto      5. Urinary retention  - Hydronephrosis has improved on CT in ED, will continue Foley for now   - follow up with urology as outpatient    6. Anemia  - Appears stable    7. Lung nodule  - Outpatient follow-up  advised  8. Deconditioning/weakness - SNF recommended. Patient awaiting placement.     DVT prophylaxis:  Rivaroxaban  (XARELTO ) tablet 15 mg     Code Status: Limited: Do not attempt resuscitation (DNR) -DNR-LIMITED -Do Not Intubate/DNI  Family Communication: Daughter at bedside  Disposition Plan: SNF Reason for continuing need for hospitalization: Med ready   Objective: Vitals:   12/11/23 0446 12/11/23 0846 12/11/23 0911 12/11/23 1100  BP: 119/61 (!) 122/59 123/60 119/78  Pulse: 69 81  83  Resp: 20 18  20   Temp: 97.9 F (36.6 C) 98.4 F (36.9 C)  97.8 F (36.6 C)  TempSrc: Oral Oral  Oral  SpO2: 100% 96%  98%    Intake/Output Summary (Last 24 hours) at 12/11/2023 1317 Last data filed at 12/11/2023 0011 Gross per 24 hour  Intake --  Output 700 ml  Net -700 ml   There were no vitals filed for this visit.   Physical Exam   General: Alert, awake, responsive Eyes: Pupils equal, reactive  Oral cavity: moist mucous membranes  Head: Atraumatic, normocephalic  Neck: supple  Chest: clear to auscultation. No crackles, no wheezes  CVS: S1,S2 RRR. No murmurs  Abd: No distention, soft, non-tender. No masses palpable  Extr: No edema.  Minimal swelling of the right hand Neurological: Grossly intact.   Data Reviewed: I have personally reviewed following labs and imaging studies  CBC: Recent Labs  Lab 12/07/23 1320 12/07/23 1341 12/08/23 0435 12/09/23 0445 12/10/23 0546 12/11/23 0604  WBC 8.2  --  6.4 4.2 4.1 3.4*  NEUTROABS 6.0  --   --   --   --   --  HGB 9.9* 9.9* 8.8* 8.7* 8.5* 9.0*  HCT 30.7* 29.0* 26.5* 26.8* 26.4* 27.8*  MCV 86.7  --  86.0 87.6 86.8 87.1  PLT 278  --  257 236 233 226   Basic Metabolic Panel: Recent Labs  Lab 12/07/23 1320 12/07/23 1341 12/08/23 0435 12/09/23 0445 12/10/23 0546 12/11/23 0604  NA 138 140 142 141 141 139  K 3.8 3.9 3.7 3.9 3.7 4.0  CL 103 104 104 109 106 109  CO2 23  --  22 23 24 23   GLUCOSE 98 100* 105* 95 101* 92   BUN 22 24* 20 21 17 16   CREATININE 0.85 0.80 0.67 0.70 0.76 0.69  CALCIUM  8.9  --  8.6* 8.1* 8.3* 8.1*  MG  --   --  1.9  --   --   --   PHOS  --   --  4.0  --   --   --    GFR: CrCl cannot be calculated (Unknown ideal weight.). Liver Function Tests: Recent Labs  Lab 12/07/23 1320  AST 22  ALT 12  ALKPHOS 64  BILITOT 0.8  PROT 6.9  ALBUMIN 2.7*   No results for input(s): LIPASE, AMYLASE in the last 168 hours. No results for input(s): AMMONIA in the last 168 hours. Coagulation Profile: No results for input(s): INR, PROTIME in the last 168 hours. Cardiac Enzymes: No results for input(s): CKTOTAL, CKMB, CKMBINDEX, TROPONINI in the last 168 hours. ProBNP, BNP (last 5 results) No results for input(s): PROBNP, BNP in the last 8760 hours. HbA1C: No results for input(s): HGBA1C in the last 72 hours. CBG: No results for input(s): GLUCAP in the last 168 hours. Lipid Profile: No results for input(s): CHOL, HDL, LDLCALC, TRIG, CHOLHDL, LDLDIRECT in the last 72 hours. Thyroid  Function Tests: No results for input(s): TSH, T4TOTAL, FREET4, T3FREE, THYROIDAB in the last 72 hours. Anemia Panel: No results for input(s): VITAMINB12, FOLATE, FERRITIN, TIBC, IRON, RETICCTPCT in the last 72 hours. Sepsis Labs: Recent Labs  Lab 12/07/23 1341 12/07/23 1611  LATICACIDVEN 2.2* 0.9    Recent Results (from the past 240 hours)  Resp panel by RT-PCR (RSV, Flu A&B, Covid) Anterior Nasal Swab     Status: None   Collection Time: 12/07/23 11:11 AM   Specimen: Anterior Nasal Swab  Result Value Ref Range Status   SARS Coronavirus 2 by RT PCR NEGATIVE NEGATIVE Final   Influenza A by PCR NEGATIVE NEGATIVE Final   Influenza B by PCR NEGATIVE NEGATIVE Final    Comment: (NOTE) The Xpert Xpress SARS-CoV-2/FLU/RSV plus assay is intended as an aid in the diagnosis of influenza from Nasopharyngeal swab specimens and should not be used as a sole  basis for treatment. Nasal washings and aspirates are unacceptable for Xpert Xpress SARS-CoV-2/FLU/RSV testing.  Fact Sheet for Patients: bloggercourse.com  Fact Sheet for Healthcare Providers: seriousbroker.it  This test is not yet approved or cleared by the [place]  FDA and has been authorized for detection and/or diagnosis of SARS-CoV-2 by FDA under an Emergency Use Authorization (EUA). This EUA will remain in effect (meaning this test can be used) for the duration of the COVID-19 declaration under Section 564(b)(1) of the Act, 21 U.S.C. section 360bbb-3(b)(1), unless the authorization is terminated or revoked.     Resp Syncytial Virus by PCR NEGATIVE NEGATIVE Final    Comment: (NOTE) Fact Sheet for Patients: bloggercourse.com  Fact Sheet for Healthcare Providers: seriousbroker.it  This test is not yet approved or cleared by the [place]  FDA  and has been authorized for detection and/or diagnosis of SARS-CoV-2 by FDA under an Emergency Use Authorization (EUA). This EUA will remain in effect (meaning this test can be used) for the duration of the COVID-19 declaration under Section 564(b)(1) of the Act, 21 U.S.C. section 360bbb-3(b)(1), unless the authorization is terminated or revoked.  Performed at [facility], [address], [place], [place] [address]   Culture, blood (routine x 2)     Status: None (Preliminary result)   Collection Time: 12/07/23  1:28 PM   Specimen: BLOOD RIGHT HAND  Result Value Ref Range Status   Specimen Description BLOOD RIGHT HAND  Final   Special Requests   Final    BOTTLES DRAWN AEROBIC ONLY Blood Culture results may not be optimal due to an inadequate volume of blood received in culture bottles   Culture   Final    NO GROWTH 4 DAYS Performed at [facility], [address], [place], [place] [address]    Report Status  PENDING  Incomplete  Culture, blood (routine x 2)     Status: None (Preliminary result)   Collection Time: 12/07/23  1:28 PM   Specimen: BLOOD LEFT ARM  Result Value Ref Range Status   Specimen Description BLOOD LEFT ARM  Final   Special Requests   Final    BOTTLES DRAWN AEROBIC AND ANAEROBIC Blood Culture adequate volume   Culture   Final    NO GROWTH 4 DAYS Performed at [facility], [address] [place], [place] [address]    Report Status PENDING  Incomplete     Radiology Studies: No results found.  Scheduled Meds:  acetaminophen   500 mg Oral Q6H   Chlorhexidine  Gluconate Cloth  6 each Topical Daily   collagenase   1 Application Topical Daily   diltiazem   120 mg Oral Daily   liver oil-zinc  oxide   Topical BID   memantine   5 mg Oral Daily   pantoprazole   40 mg Oral Daily   potassium chloride   10 mEq Oral Once   QUEtiapine   25 mg Oral QHS   Rivaroxaban   15 mg Oral QPM   sodium chloride  flush  3 mL Intravenous Q12H   Continuous Infusions:   LOS: 0 days   Time spent: 36 minutes  MDALA-GAUSI, Miata Culbreth AGATHA, MD  [facility]  12/11/2023, 1:17 PM

## 2023-12-11 NOTE — Plan of Care (Signed)
  Problem: Health Behavior/Discharge Planning: Goal: Ability to manage health-related needs will improve Outcome: Progressing   Problem: Clinical Measurements: Goal: Ability to maintain clinical measurements within normal limits will improve Outcome: Progressing Goal: Will remain free from infection Outcome: Progressing Goal: Cardiovascular complication will be avoided Outcome: Progressing   Problem: Activity: Goal: Risk for activity intolerance will decrease Outcome: Progressing   Problem: Nutrition: Goal: Adequate nutrition will be maintained Outcome: Progressing   Problem: Coping: Goal: Level of anxiety will decrease Outcome: Progressing   Problem: Safety: Goal: Ability to remain free from injury will improve Outcome: Progressing

## 2023-12-12 ENCOUNTER — Other Ambulatory Visit: Payer: Self-pay

## 2023-12-12 DIAGNOSIS — G9340 Encephalopathy, unspecified: Secondary | ICD-10-CM | POA: Diagnosis not present

## 2023-12-12 LAB — CULTURE, BLOOD (ROUTINE X 2)
Culture: NO GROWTH
Culture: NO GROWTH
Special Requests: ADEQUATE

## 2023-12-12 MED ORDER — ENSURE PLUS HIGH PROTEIN PO LIQD
237.0000 mL | Freq: Two times a day (BID) | ORAL | Status: DC
  Administered 2023-12-14 – 2023-12-17 (×5): 237 mL via ORAL

## 2023-12-12 NOTE — Progress Notes (Signed)
 PROGRESS NOTE    Christine [name]  FMW:[id number] DOB: [birth year] DOA: 12/07/2023 PCP: Shayne Anes, MD  Subjective: Patient's complains of left lower extremity pain, which is chronic. Christine [name]    Hospital Course: 89 y.o. adult with medical history significant for dementia with behavioral disturbance, coronary artery disease, atrial fibrillation on Xarelto , recurrent UTIs, recurrent falls, and recent admission with urinary retention and bilateral hydronephrosis who now returns with inability to ambulate and increased confusion after another fall.  Patient was recent hospitalized, found to have urinary retention with bilateral hydronephrosis. She had Foley catheter placed, failed a voiding trial, and was discharged with Foley. PT had recommended that she be discharged to an SNF but she returned to her ALF at the daughter's request. She fell again shortly after discharge, PT recommending SNF-R   Assessment and Plan:  1. Acute encephalopathy  - Presented with acute worsening of her confusion initially, but per daughter patient is back to her baseline now  - confusion most likely from dehydration and pain in her right arm/hand, less likely to be infectious related. Right hand xrays negative for fracture, showed OA  - continue delirium precautions - pain control with scheduled tylenol  and PRN tramadol , hasn't needed any. Avoiding fentanyl  in the elderly, if IV pain med needed for pain can consider lowest dose of dilaudid    2. Dementia with behavioral disturbance  - Hold Aricept  given prolonged QT, continue Namenda , Seroquel , as-needed Ativan , and Depakote , use delirium precautions     3. Prolonged QT interval  - avoid QT-prolonging medications     4. PAF  - Continue diltiazem  and Xarelto      5. Urinary retention  - Hydronephrosis has improved on CT in ED, will continue Foley for now   - follow up with urology as outpatient    6. Anemia  - Appears stable    7. Lung nodule  - Outpatient  follow-up advised  8. Deconditioning/weakness - SNF recommended. Patient awaiting placement.     DVT prophylaxis:  Rivaroxaban  (XARELTO ) tablet 15 mg     Code Status: Limited: Do not attempt resuscitation (DNR) -DNR-LIMITED -Do Not Intubate/DNI  Family Communication: Daughter at bedside  Disposition Plan: SNF Reason for continuing need for hospitalization: Med ready   Objective: Vitals:   12/11/23 2009 12/11/23 2339 12/12/23 0009 12/12/23 0737  BP: 138/62 134/62  132/89  Pulse: 71 77  81  Resp:      Temp: 97.9 F (36.6 C) 97.6 F (36.4 C)  97.9 F (36.6 C)  TempSrc:      SpO2: 98% 99%  99%  Weight:   54.6 kg   Height:   5' 4 (1.626 m)    No intake or output data in the 24 hours ending 12/12/23 1431  Filed Weights   12/12/23 0009  Weight: 54.6 kg     Physical Exam   General: Alert, awake, responsive Eyes: Pupils equal, reactive  Oral cavity: moist mucous membranes  Head: Atraumatic, normocephalic  Neck: supple  Chest: clear to auscultation. No crackles, no wheezes  CVS: S1,S2 RRR. No murmurs  Abd: No distention, soft, non-tender. No masses palpable  Extr: No edema.  Minimal swelling of the right hand Neurological: Grossly intact.   Data Reviewed: I have personally reviewed following labs and imaging studies  CBC: Recent Labs  Lab 12/07/23 1320 12/07/23 1341 12/08/23 0435 12/09/23 0445 12/10/23 0546 12/11/23 0604  WBC 8.2  --  6.4 4.2 4.1 3.4*  NEUTROABS 6.0  --   --   --   --   --  HGB 9.9* 9.9* 8.8* 8.7* 8.5* 9.0*  HCT 30.7* 29.0* 26.5* 26.8* 26.4* 27.8*  MCV 86.7  --  86.0 87.6 86.8 87.1  PLT 278  --  257 236 233 226   Basic Metabolic Panel: Recent Labs  Lab 12/07/23 1320 12/07/23 1341 12/08/23 0435 12/09/23 0445 12/10/23 0546 12/11/23 0604  NA 138 140 142 141 141 139  K 3.8 3.9 3.7 3.9 3.7 4.0  CL 103 104 104 109 106 109  CO2 23  --  22 23 24 23   GLUCOSE 98 100* 105* 95 101* 92  BUN 22 24* 20 21 17 16   CREATININE 0.85 0.80 0.67  0.70 0.76 0.69  CALCIUM  8.9  --  8.6* 8.1* 8.3* 8.1*  MG  --   --  1.9  --   --   --   PHOS  --   --  4.0  --   --   --    GFR: Estimated Creatinine Clearance (by C-G formula based on SCr of 0.69 mg/dL) Female: 58.0 mL/min Female: 49.3 mL/min Liver Function Tests: Recent Labs  Lab 12/07/23 1320  AST 22  ALT 12  ALKPHOS 64  BILITOT 0.8  PROT 6.9  ALBUMIN 2.7*   No results for input(s): LIPASE, AMYLASE in the last 168 hours. No results for input(s): AMMONIA in the last 168 hours. Coagulation Profile: No results for input(s): INR, PROTIME in the last 168 hours. Cardiac Enzymes: No results for input(s): CKTOTAL, CKMB, CKMBINDEX, TROPONINI in the last 168 hours. ProBNP, BNP (last 5 results) No results for input(s): PROBNP, BNP in the last 8760 hours. HbA1C: No results for input(s): HGBA1C in the last 72 hours. CBG: No results for input(s): GLUCAP in the last 168 hours. Lipid Profile: No results for input(s): CHOL, HDL, LDLCALC, TRIG, CHOLHDL, LDLDIRECT in the last 72 hours. Thyroid  Function Tests: No results for input(s): TSH, T4TOTAL, FREET4, T3FREE, THYROIDAB in the last 72 hours. Anemia Panel: No results for input(s): VITAMINB12, FOLATE, FERRITIN, TIBC, IRON, RETICCTPCT in the last 72 hours. Sepsis Labs: Recent Labs  Lab 12/07/23 1341 12/07/23 1611  LATICACIDVEN 2.2* 0.9    Recent Results (from the past 240 hours)  Resp panel by RT-PCR (RSV, Flu A&B, Covid) Anterior Nasal Swab     Status: None   Collection Time: 12/07/23 11:11 AM   Specimen: Anterior Nasal Swab  Result Value Ref Range Status   SARS Coronavirus 2 by RT PCR NEGATIVE NEGATIVE Final   Influenza A by PCR NEGATIVE NEGATIVE Final   Influenza B by PCR NEGATIVE NEGATIVE Final    Comment: (NOTE) The Xpert Xpress SARS-CoV-2/FLU/RSV plus assay is intended as an aid in the diagnosis of influenza from Nasopharyngeal swab specimens and should not be  used as a sole basis for treatment. Nasal washings and aspirates are unacceptable for Xpert Xpress SARS-CoV-2/FLU/RSV testing.  Fact Sheet for Patients: bloggercourse.com  Fact Sheet for Healthcare Providers: seriousbroker.it  This test is not yet approved or cleared by the [place]  FDA and has been authorized for detection and/or diagnosis of SARS-CoV-2 by FDA under an Emergency Use Authorization (EUA). This EUA will remain in effect (meaning this test can be used) for the duration of the COVID-19 declaration under Section 564(b)(1) of the Act, 21 U.S.C. section 360bbb-3(b)(1), unless the authorization is terminated or revoked.     Resp Syncytial Virus by PCR NEGATIVE NEGATIVE Final    Comment: (NOTE) Fact Sheet for Patients: bloggercourse.com  Fact Sheet for Healthcare Providers: seriousbroker.it  This test  is not yet approved or cleared by the [place]  FDA and has been authorized for detection and/or diagnosis of SARS-CoV-2 by FDA under an Emergency Use Authorization (EUA). This EUA will remain in effect (meaning this test can be used) for the duration of the COVID-19 declaration under Section 564(b)(1) of the Act, 21 U.S.C. section 360bbb-3(b)(1), unless the authorization is terminated or revoked.  Performed at [facility], [address], [place], [place] [address]   Culture, blood (routine x 2)     Status: None   Collection Time: 12/07/23  1:28 PM   Specimen: BLOOD RIGHT HAND  Result Value Ref Range Status   Specimen Description BLOOD RIGHT HAND  Final   Special Requests   Final    BOTTLES DRAWN AEROBIC ONLY Blood Culture results may not be optimal due to an inadequate volume of blood received in culture bottles   Culture   Final    NO GROWTH 5 DAYS Performed at [facility], [address] [place], [place] [address]    Report Status  12/12/2023 FINAL  Final  Culture, blood (routine x 2)     Status: None   Collection Time: 12/07/23  1:28 PM   Specimen: BLOOD LEFT ARM  Result Value Ref Range Status   Specimen Description BLOOD LEFT ARM  Final   Special Requests   Final    BOTTLES DRAWN AEROBIC AND ANAEROBIC Blood Culture adequate volume   Culture   Final    NO GROWTH 5 DAYS Performed at [facility], [address] [place], [place] [address]    Report Status 12/12/2023 FINAL  Final     Radiology Studies: No results found.  Scheduled Meds:  acetaminophen   500 mg Oral Q6H   Chlorhexidine  Gluconate Cloth  6 each Topical Daily   collagenase   1 Application Topical Daily   diltiazem   120 mg Oral Daily   liver oil-zinc  oxide   Topical BID   memantine   5 mg Oral Daily   pantoprazole   40 mg Oral Daily   potassium chloride   10 mEq Oral Once   QUEtiapine   25 mg Oral QHS   Rivaroxaban   15 mg Oral QPM   sodium chloride  flush  3 mL Intravenous Q12H   Continuous Infusions:   LOS: 0 days   Time spent: 36 minutes  MDALA-GAUSI, GOLDEN PILLOW, MD  [facility]  12/12/2023, 2:31 PM

## 2023-12-12 NOTE — Plan of Care (Signed)

## 2023-12-12 NOTE — Progress Notes (Signed)
 Physical Therapy Treatment Patient Details Name: Christine [name] MRN: [id number] DOB: November 20, 1933 Today's Date: 12/12/2023   History of Present Illness 89 y.o. female admitted from ALF on 12/07/23 with acute encephalopathy, dehydration, increased falls. Of note, 2x recent admissions 11/2023 with similar concerns, bilateral hydronephrosis and d/c with foley. PMH includes dementia, CAD, afib, TIA, recurrent UTIs.   PT Comments  Pt progressing with mobility. Today's session focused on transfer and gait training with RW, pt requiring up to mod-maxA to maintain balance ambulating short distances with RW. Pt remains limited by generalized weakness, decreased activity tolerance, poor balance strategies/postural reactions and impaired cognition. Continue to recommend post-acute rehab (< 3 hrs/day) to maximize functional mobility and independence. Will follow acutely to address established goals.     If plan is discharge home, recommend the following: A lot of help with walking and/or transfers;A lot of help with bathing/dressing/bathroom   Can travel by private vehicle     No  Equipment Recommendations  Other (comment) (TBD next venue)    Recommendations for Other Services       Precautions / Restrictions Precautions Precautions: Fall Recall of Precautions/Restrictions: Impaired Restrictions Weight Bearing Restrictions Per Provider Order: No     Mobility  Bed Mobility Overal bed mobility: Needs Assistance Bed Mobility: Sit to Supine       Sit to supine: Min assist   General bed mobility comments: requires encouragement to perform task without assist, minA for BLE management    Transfers Overall transfer level: Needs assistance Equipment used: Rolling walker (2 wheels) Transfers: Sit to/from Stand Sit to Stand: Mod assist           General transfer comment: multiple sit<>stands from recliner, chair & BSC (in front of sink) to RW, modA for trunk elevation and RW stability as pt  pulling on RW, max verbal cues    Ambulation/Gait Ambulation/Gait assistance: Mod assist, Min assist, +2 safety/equipment, Max assist Gait Distance (Feet): 16 Feet (+ 10') Assistive device: Rolling walker (2 wheels) Gait Pattern/deviations: Step-to pattern, Step-through pattern, Decreased stride length, Trunk flexed, Leaning posteriorly Gait velocity: Decreased     General Gait Details: pt walking 55' + 10' with RW and min-maxA for stability due to posterior lean at times, cues for sequencing and directions; seated rest break secondary to fatigue. daughter providing +2 assist for safety and encouragement   Stairs             Wheelchair Mobility     Tilt Bed    Modified Rankin (Stroke Patients Only)       Balance Overall balance assessment: Needs assistance Sitting-balance support: No upper extremity supported, Feet supported Sitting balance-Leahy Scale: Fair     Standing balance support: No upper extremity supported, During functional activity, Reliant on assistive device for balance, Bilateral upper extremity supported   Standing balance comment: posterior LOB when standing without UE support to wash and dry hands, can stand briefly with CGA with BUE support on RW                            Communication Communication Communication: No apparent difficulties  Cognition Arousal: Alert Behavior During Therapy: Flat affect   PT - Cognitive impairments: History of cognitive impairments, Memory                       PT - Cognition Comments: h/o dementia, follows simple commands with increased cues Following commands: Impaired Following  commands impaired: Follows one step commands with increased time    Cueing Cueing Techniques: Verbal cues, Gestural cues, Tactile cues  Exercises      General Comments General comments (skin integrity, edema, etc.): pt's daughter prsent and supportive. post-mobility BP 134/74, HR 70s-80s, SpO2 99% on RA.  daughter reports pt needs to be more mobile for safe d/c to ALF, therefore they are pursuing SNF rehab rec      Pertinent Vitals/Pain Pain Assessment Pain Assessment: Faces Faces Pain Scale: No hurt Pain Intervention(s): Monitored during session    Home Living                          Prior Function            PT Goals (current goals can now be found in the care plan section) Acute Rehab PT Goals Patient Stated Goal: regain strength PT Goal Formulation: With family Progress towards PT goals: Progressing toward goals    Frequency    Min 1X/week      PT Plan      Co-evaluation              AM-PAC PT 6 Clicks Mobility   Outcome Measure  Help needed turning from your back to your side while in a flat bed without using bedrails?: A Little Help needed moving from lying on your back to sitting on the side of a flat bed without using bedrails?: A Lot Help needed moving to and from a bed to a chair (including a wheelchair)?: A Lot Help needed standing up from a chair using your arms (e.g., wheelchair or bedside chair)?: A Lot Help needed to walk in hospital room?: A Lot Help needed climbing 3-5 steps with a railing? : Total 6 Click Score: 12    End of Session Equipment Utilized During Treatment: Gait belt Activity Tolerance: Patient tolerated treatment well Patient left: in bed;with call bell/phone within reach;with bed alarm set;with family/visitor present Nurse Communication: Mobility status PT Visit Diagnosis: History of falling (Z91.81);Muscle weakness (generalized) (M62.81);Other abnormalities of gait and mobility (R26.89)     Time: 8856-8789 PT Time Calculation (min) (ACUTE ONLY): 27 min  Charges:    $Gait Training: 8-22 mins $Therapeutic Activity: 8-22 mins PT General Charges $$ ACUTE PT VISIT: 1 Visit                      Darice Almas, PT, DPT Acute Rehabilitation Services  Personal: Secure Chat Rehab Office:  [phone]  [name] 12/12/2023, 12:41 PM

## 2023-12-13 DIAGNOSIS — G9340 Encephalopathy, unspecified: Secondary | ICD-10-CM | POA: Diagnosis not present

## 2023-12-13 NOTE — Plan of Care (Signed)

## 2023-12-13 NOTE — Plan of Care (Signed)
  Problem: Elimination: Goal: Will not experience complications related to urinary retention Outcome: Progressing   Problem: Pain Managment: Goal: General experience of comfort will improve and/or be controlled Outcome: Progressing   Problem: Education: Goal: Knowledge of General Education information will improve Description: Including pain rating scale, medication(s)/side effects and non-pharmacologic comfort measures Outcome: Not Progressing

## 2023-12-13 NOTE — Progress Notes (Signed)
 PROGRESS NOTE    Christine [name]  FMW:[id number] DOB: 09-07-1933 DOA: 12/07/2023 PCP: Shayne Anes, MD  Subjective: Patient has no new complaints today.  Sitting up in chair. Apparently has to be evaluated by SNF prior to discharge.    Hospital Course: 89 y.o. adult with medical history significant for dementia with behavioral disturbance, coronary artery disease, atrial fibrillation on Xarelto , recurrent UTIs, recurrent falls, and recent admission with urinary retention and bilateral hydronephrosis who now returns with inability to ambulate and increased confusion after another fall.  Patient was recent hospitalized, found to have urinary retention with bilateral hydronephrosis. She had Foley catheter placed, failed a voiding trial, and was discharged with Foley. PT had recommended that she be discharged to an SNF but she returned to her ALF at the daughter's request. She fell again shortly after discharge, PT recommending SNF-R   Assessment and Plan:  1. Acute encephalopathy  - Presented with acute worsening of her confusion initially, but per daughter patient is back to her baseline now  - confusion most likely from dehydration and pain in her right arm/hand, less likely to be infectious related. Right hand xrays negative for fracture, showed OA  - continue delirium precautions - pain control with scheduled tylenol  and PRN tramadol , hasn't needed any. Avoiding fentanyl  in the elderly, if IV pain med needed for pain can consider lowest dose of dilaudid    2. Dementia with behavioral disturbance  - Hold Aricept  given prolonged QT, continue Namenda , Seroquel , as-needed Ativan , and Depakote , use delirium precautions     3. Prolonged QT interval  - avoid QT-prolonging medications     4. PAF  - Continue diltiazem  and Xarelto      5. Urinary retention  - Hydronephrosis has improved on CT in ED, will continue Foley for now   - follow up with urology as outpatient    6. Anemia  - Appears  stable    7. Lung nodule  - Outpatient follow-up advised  8. Deconditioning/weakness - SNF recommended. Patient awaiting placement.     DVT prophylaxis:  Rivaroxaban  (XARELTO ) tablet 15 mg     Code Status: Limited: Do not attempt resuscitation (DNR) -DNR-LIMITED -Do Not Intubate/DNI  Family Communication: Daughter at bedside  Disposition Plan: SNF Reason for continuing need for hospitalization: Med ready   Objective: Vitals:   12/12/23 1621 12/12/23 1957 12/13/23 0553 12/13/23 0746  BP: (!) 141/60 112/79 (!) 139/58 123/78  Pulse: 76 72 78 82  Resp:    18  Temp: 98.2 F (36.8 C) 98.2 F (36.8 C)  (!) 97.3 F (36.3 C)  TempSrc: Oral Oral  Oral  SpO2: 100% 98% 100% 100%  Weight:      Height:       No intake or output data in the 24 hours ending 12/13/23 1107  Filed Weights   12/12/23 0009  Weight: 54.6 kg     Physical Exam   General: Alert, awake, responsive Eyes: Pupils equal, reactive  Oral cavity: moist mucous membranes  Head: Atraumatic, normocephalic  Neck: supple  Chest: clear to auscultation. No crackles, no wheezes  CVS: S1,S2 RRR. No murmurs  Abd: No distention, soft, non-tender. No masses palpable  Extr: No edema.  Minimal swelling of the right hand Neurological: Grossly intact.   Data Reviewed: I have personally reviewed following labs and imaging studies  CBC: Recent Labs  Lab 12/07/23 1320 12/07/23 1341 12/08/23 0435 12/09/23 0445 12/10/23 0546 12/11/23 0604  WBC 8.2  --  6.4 4.2 4.1 3.4*  NEUTROABS 6.0  --   --   --   --   --   HGB 9.9* 9.9* 8.8* 8.7* 8.5* 9.0*  HCT 30.7* 29.0* 26.5* 26.8* 26.4* 27.8*  MCV 86.7  --  86.0 87.6 86.8 87.1  PLT 278  --  257 236 233 226   Basic Metabolic Panel: Recent Labs  Lab 12/07/23 1320 12/07/23 1341 12/08/23 0435 12/09/23 0445 12/10/23 0546 12/11/23 0604  NA 138 140 142 141 141 139  K 3.8 3.9 3.7 3.9 3.7 4.0  CL 103 104 104 109 106 109  CO2 23  --  22 23 24 23   GLUCOSE 98 100* 105* 95  101* 92  BUN 22 24* 20 21 17 16   CREATININE 0.85 0.80 0.67 0.70 0.76 0.69  CALCIUM  8.9  --  8.6* 8.1* 8.3* 8.1*  MG  --   --  1.9  --   --   --   PHOS  --   --  4.0  --   --   --    GFR: Estimated Creatinine Clearance (by C-G formula based on SCr of 0.69 mg/dL) Female: 58.0 mL/min Female: 49.3 mL/min Liver Function Tests: Recent Labs  Lab 12/07/23 1320  AST 22  ALT 12  ALKPHOS 64  BILITOT 0.8  PROT 6.9  ALBUMIN 2.7*   No results for input(s): LIPASE, AMYLASE in the last 168 hours. No results for input(s): AMMONIA in the last 168 hours. Coagulation Profile: No results for input(s): INR, PROTIME in the last 168 hours. Cardiac Enzymes: No results for input(s): CKTOTAL, CKMB, CKMBINDEX, TROPONINI in the last 168 hours. ProBNP, BNP (last 5 results) No results for input(s): PROBNP, BNP in the last 8760 hours. HbA1C: No results for input(s): HGBA1C in the last 72 hours. CBG: No results for input(s): GLUCAP in the last 168 hours. Lipid Profile: No results for input(s): CHOL, HDL, LDLCALC, TRIG, CHOLHDL, LDLDIRECT in the last 72 hours. Thyroid  Function Tests: No results for input(s): TSH, T4TOTAL, FREET4, T3FREE, THYROIDAB in the last 72 hours. Anemia Panel: No results for input(s): VITAMINB12, FOLATE, FERRITIN, TIBC, IRON, RETICCTPCT in the last 72 hours. Sepsis Labs: Recent Labs  Lab 12/07/23 1341 12/07/23 1611  LATICACIDVEN 2.2* 0.9    Recent Results (from the past 240 hours)  Resp panel by RT-PCR (RSV, Flu A&B, Covid) Anterior Nasal Swab     Status: None   Collection Time: 12/07/23 11:11 AM   Specimen: Anterior Nasal Swab  Result Value Ref Range Status   SARS Coronavirus 2 by RT PCR NEGATIVE NEGATIVE Final   Influenza A by PCR NEGATIVE NEGATIVE Final   Influenza B by PCR NEGATIVE NEGATIVE Final    Comment: (NOTE) The Xpert Xpress SARS-CoV-2/FLU/RSV plus assay is intended as an aid in the diagnosis of  influenza from Nasopharyngeal swab specimens and should not be used as a sole basis for treatment. Nasal washings and aspirates are unacceptable for Xpert Xpress SARS-CoV-2/FLU/RSV testing.  Fact Sheet for Patients: bloggercourse.com  Fact Sheet for Healthcare Providers: seriousbroker.it  This test is not yet approved or cleared by the [place]  FDA and has been authorized for detection and/or diagnosis of SARS-CoV-2 by FDA under an Emergency Use Authorization (EUA). This EUA will remain in effect (meaning this test can be used) for the duration of the COVID-19 declaration under Section 564(b)(1) of the Act, 21 U.S.C. section 360bbb-3(b)(1), unless the authorization is terminated or revoked.     Resp Syncytial Virus by PCR NEGATIVE NEGATIVE Final  Comment: (NOTE) Fact Sheet for Patients: bloggercourse.com  Fact Sheet for Healthcare Providers: seriousbroker.it  This test is not yet approved or cleared by the [place]  FDA and has been authorized for detection and/or diagnosis of SARS-CoV-2 by FDA under an Emergency Use Authorization (EUA). This EUA will remain in effect (meaning this test can be used) for the duration of the COVID-19 declaration under Section 564(b)(1) of the Act, 21 U.S.C. section 360bbb-3(b)(1), unless the authorization is terminated or revoked.  Performed at [facility], [address] [place], [place] [address]   Culture, blood (routine x 2)     Status: None   Collection Time: 12/07/23  1:28 PM   Specimen: BLOOD RIGHT HAND  Result Value Ref Range Status   Specimen Description BLOOD RIGHT HAND  Final   Special Requests   Final    BOTTLES DRAWN AEROBIC ONLY Blood Culture results may not be optimal due to an inadequate volume of blood received in culture bottles   Culture   Final    NO GROWTH 5 DAYS Performed at [facility],  [address] [place], [place] [address]    Report Status 12/12/2023 FINAL  Final  Culture, blood (routine x 2)     Status: None   Collection Time: 12/07/23  1:28 PM   Specimen: BLOOD LEFT ARM  Result Value Ref Range Status   Specimen Description BLOOD LEFT ARM  Final   Special Requests   Final    BOTTLES DRAWN AEROBIC AND ANAEROBIC Blood Culture adequate volume   Culture   Final    NO GROWTH 5 DAYS Performed at [facility], [address] [place], [place] [address]    Report Status 12/12/2023 FINAL  Final     Radiology Studies: No results found.  Scheduled Meds:  acetaminophen   500 mg Oral Q6H   Chlorhexidine  Gluconate Cloth  6 each Topical Daily   collagenase   1 Application Topical Daily   diltiazem   120 mg Oral Daily   feeding supplement  237 mL Oral BID BM   liver oil-zinc  oxide   Topical BID   memantine   5 mg Oral Daily   pantoprazole   40 mg Oral Daily   potassium chloride   10 mEq Oral Once   QUEtiapine   25 mg Oral QHS   Rivaroxaban   15 mg Oral QPM   sodium chloride  flush  3 mL Intravenous Q12H   Continuous Infusions:   LOS: 0 days   Time spent: 30 minutes  MDALA-GAUSI, GOLDEN PILLOW, MD  [facility]  12/13/2023, 11:07 AM

## 2023-12-14 DIAGNOSIS — G9340 Encephalopathy, unspecified: Secondary | ICD-10-CM | POA: Diagnosis not present

## 2023-12-14 NOTE — Progress Notes (Signed)
 Physical Therapy Treatment Patient Details Name: Christine [name] MRN: [id number] DOB: 22-Aug-1933 Today's Date: 12/14/2023   History of Present Illness 89 y.o. female admitted from ALF on 12/07/23 with acute encephalopathy, dehydration, increased falls. Of note, 2x recent admissions 11/2023 with similar concerns, bilateral hydronephrosis and d/c with foley. PMH includes dementia, CAD, afib, TIA, recurrent UTIs.    PT Comments  Pt tolerates treatment well, ambulating for short household distances but continuing to demonstrate a persistent posterior lean. Pt remains at a high risk for falls due to posterior lean. PT will follow in an effort to improve balance. Patient will benefit from continued inpatient follow up therapy, <3 hours/day.   If plan is discharge home, recommend the following: A lot of help with walking and/or transfers;A lot of help with bathing/dressing/bathroom   Can travel by private vehicle     No  Equipment Recommendations  Wheelchair (measurements PT);Hospital bed    Recommendations for Other Services       Precautions / Restrictions Precautions Precautions: Fall Recall of Precautions/Restrictions: Impaired Restrictions Weight Bearing Restrictions Per Provider Order: No     Mobility  Bed Mobility Overal bed mobility: Needs Assistance Bed Mobility: Supine to Sit     Supine to sit: Min assist, HOB elevated, Used rails          Transfers Overall transfer level: Needs assistance Equipment used: Rolling walker (2 wheels) Transfers: Sit to/from Stand Sit to Stand: Min assist           General transfer comment: posterior lean for initial transfer, this decreases with 2 more transfer attempts from recliner to complete session    Ambulation/Gait Ambulation/Gait assistance: Mod assist Gait Distance (Feet): 15 Feet Assistive device: Rolling walker (2 wheels) Gait Pattern/deviations: Step-to pattern Gait velocity: reduced Gait velocity interpretation:  <1.31 ft/sec, indicative of household ambulator   General Gait Details: short step-to gait, posterior lean frequently and assist to guide walker to complete turn to sit in recliner   Stairs             Wheelchair Mobility     Tilt Bed    Modified Rankin (Stroke Patients Only)       Balance Overall balance assessment: Needs assistance Sitting-balance support: No upper extremity supported, Feet supported Sitting balance-Leahy Scale: Good     Standing balance support: Bilateral upper extremity supported, Reliant on assistive device for balance Standing balance-Leahy Scale: Poor                              Communication Communication Communication: No apparent difficulties  Cognition Arousal: Alert Behavior During Therapy: Impulsive   PT - Cognitive impairments: History of cognitive impairments                       PT - Cognition Comments: h/o dementia, follows simple commands with increased cues and time Following commands: Impaired Following commands impaired: Follows one step commands with increased time, Follows multi-step commands inconsistently    Cueing Cueing Techniques: Verbal cues, Tactile cues, Visual cues  Exercises      General Comments General comments (skin integrity, edema, etc.): pt in NAD, HR ranging from 90 in sitting to 110 upon completion of ambulation, recovers quickly back to 90 in sitting      Pertinent Vitals/Pain Pain Assessment Pain Assessment: No/denies pain    Home Living  Prior Function            PT Goals (current goals can now be found in the care plan section) Acute Rehab PT Goals Patient Stated Goal: regain strength Progress towards PT goals: Progressing toward goals    Frequency    Min 1X/week      PT Plan      Co-evaluation              AM-PAC PT 6 Clicks Mobility   Outcome Measure  Help needed turning from your back to your side while  in a flat bed without using bedrails?: A Little Help needed moving from lying on your back to sitting on the side of a flat bed without using bedrails?: A Lot Help needed moving to and from a bed to a chair (including a wheelchair)?: A Little Help needed standing up from a chair using your arms (e.g., wheelchair or bedside chair)?: A Little Help needed to walk in hospital room?: A Lot Help needed climbing 3-5 steps with a railing? : Total 6 Click Score: 14    End of Session Equipment Utilized During Treatment: Gait belt Activity Tolerance: Patient tolerated treatment well Patient left: in chair;with call bell/phone within reach;with nursing/sitter in room Nurse Communication: Mobility status PT Visit Diagnosis: History of falling (Z91.81);Muscle weakness (generalized) (M62.81);Other abnormalities of gait and mobility (R26.89) Pain - Right/Left: Right Pain - part of body: Arm;Leg     Time: 1134-1150 PT Time Calculation (min) (ACUTE ONLY): 16 min  Charges:    $Therapeutic Activity: 8-22 mins PT General Charges $$ ACUTE PT VISIT: 1 Visit                     Bernardino JINNY Ruth, PT, DPT Acute Rehabilitation Office [phone]    Bernardino JINNY Ruth 12/14/2023, 1:37 PM

## 2023-12-14 NOTE — Progress Notes (Signed)
 PROGRESS NOTE    Christine [name]  FMW:[id number] DOB: 02-23-1933 DOA: 12/07/2023 PCP: Shayne Anes, MD  Subjective: Patient is feeling well. No new issues.     Hospital Course: 89 y.o. adult with medical history significant for dementia with behavioral disturbance, coronary artery disease, atrial fibrillation on Xarelto , recurrent UTIs, recurrent falls, and recent admission with urinary retention and bilateral hydronephrosis who now returns with inability to ambulate and increased confusion after another fall.  Patient was recent hospitalized, found to have urinary retention with bilateral hydronephrosis. She had Foley catheter placed, failed a voiding trial, and was discharged with Foley. PT had recommended that she be discharged to an SNF but she returned to her ALF at the daughter's request. She fell again shortly after discharge, PT recommending SNF-R   Assessment and Plan:  1. Acute encephalopathy  - Presented with acute worsening of her confusion initially, but per daughter patient is back to her baseline now  - confusion most likely from dehydration and pain in her right arm/hand, less likely to be infectious related. Right hand xrays negative for fracture, showed OA  - continue delirium precautions - pain control with scheduled tylenol  and PRN tramadol , hasn't needed any. Avoiding fentanyl  in the elderly, if IV pain med needed for pain can consider lowest dose of dilaudid    2. Dementia with behavioral disturbance  - Hold Aricept  given prolonged QT, continue Namenda , Seroquel , as-needed Ativan , and Depakote , use delirium precautions     3. Prolonged QT interval  - avoid QT-prolonging medications     4. PAF  - Continue diltiazem  and Xarelto      5. Urinary retention  - Hydronephrosis has improved on CT in ED, will continue Foley for now   - follow up with urology as outpatient    6. Anemia  - Appears stable    7. Lung nodule  - Outpatient follow-up advised  8.  Deconditioning/weakness - SNF recommended. Patient awaiting placement.     DVT prophylaxis:  Rivaroxaban  (XARELTO ) tablet 15 mg     Code Status: Limited: Do not attempt resuscitation (DNR) -DNR-LIMITED -Do Not Intubate/DNI  Family Communication: Daughter at bedside  Disposition Plan: SNF Reason for continuing need for hospitalization: Med ready   Objective: Vitals:   12/14/23 0000 12/14/23 0400 12/14/23 0757 12/14/23 1230  BP: 123/73 136/69 134/66 (!) 148/78  Pulse: 81 80 75 78  Resp: 16 16 17 17   Temp: 98 F (36.7 C) 98.5 F (36.9 C) 98.3 F (36.8 C) 98.4 F (36.9 C)  TempSrc: Oral Oral Oral Oral  SpO2: 98% 100% 98% 97%  Weight:      Height:        Intake/Output Summary (Last 24 hours) at 12/14/2023 1319 Last data filed at 12/14/2023 0916 Gross per 24 hour  Intake 120 ml  Output 400 ml  Net -280 ml    Filed Weights   12/12/23 0009  Weight: 54.6 kg     Physical Exam   General: Alert, awake, responsive Eyes: Pupils equal, reactive  Oral cavity: moist mucous membranes  Head: Atraumatic, normocephalic  Neck: supple  Chest: clear to auscultation. No crackles, no wheezes  CVS: S1,S2 RRR. No murmurs  Abd: No distention, soft, non-tender. No masses palpable  Extr: No edema.  Minimal swelling of the right hand Neurological: Grossly intact.   Data Reviewed: I have personally reviewed following labs and imaging studies  CBC: Recent Labs  Lab 12/07/23 1320 12/07/23 1341 12/08/23 0435 12/09/23 0445 12/10/23 0546 12/11/23 0604  WBC 8.2  --  6.4 4.2 4.1 3.4*  NEUTROABS 6.0  --   --   --   --   --   HGB 9.9* 9.9* 8.8* 8.7* 8.5* 9.0*  HCT 30.7* 29.0* 26.5* 26.8* 26.4* 27.8*  MCV 86.7  --  86.0 87.6 86.8 87.1  PLT 278  --  257 236 233 226   Basic Metabolic Panel: Recent Labs  Lab 12/07/23 1320 12/07/23 1341 12/08/23 0435 12/09/23 0445 12/10/23 0546 12/11/23 0604  NA 138 140 142 141 141 139  K 3.8 3.9 3.7 3.9 3.7 4.0  CL 103 104 104 109 106 109  CO2  23  --  22 23 24 23   GLUCOSE 98 100* 105* 95 101* 92  BUN 22 24* 20 21 17 16   CREATININE 0.85 0.80 0.67 0.70 0.76 0.69  CALCIUM  8.9  --  8.6* 8.1* 8.3* 8.1*  MG  --   --  1.9  --   --   --   PHOS  --   --  4.0  --   --   --    GFR: Estimated Creatinine Clearance (by C-G formula based on SCr of 0.69 mg/dL) Female: 58.0 mL/min Female: 49.3 mL/min Liver Function Tests: Recent Labs  Lab 12/07/23 1320  AST 22  ALT 12  ALKPHOS 64  BILITOT 0.8  PROT 6.9  ALBUMIN 2.7*   No results for input(s): LIPASE, AMYLASE in the last 168 hours. No results for input(s): AMMONIA in the last 168 hours. Coagulation Profile: No results for input(s): INR, PROTIME in the last 168 hours. Cardiac Enzymes: No results for input(s): CKTOTAL, CKMB, CKMBINDEX, TROPONINI in the last 168 hours. ProBNP, BNP (last 5 results) No results for input(s): PROBNP, BNP in the last 8760 hours. HbA1C: No results for input(s): HGBA1C in the last 72 hours. CBG: No results for input(s): GLUCAP in the last 168 hours. Lipid Profile: No results for input(s): CHOL, HDL, LDLCALC, TRIG, CHOLHDL, LDLDIRECT in the last 72 hours. Thyroid  Function Tests: No results for input(s): TSH, T4TOTAL, FREET4, T3FREE, THYROIDAB in the last 72 hours. Anemia Panel: No results for input(s): VITAMINB12, FOLATE, FERRITIN, TIBC, IRON, RETICCTPCT in the last 72 hours. Sepsis Labs: Recent Labs  Lab 12/07/23 1341 12/07/23 1611  LATICACIDVEN 2.2* 0.9    Recent Results (from the past 240 hours)  Resp panel by RT-PCR (RSV, Flu A&B, Covid) Anterior Nasal Swab     Status: None   Collection Time: 12/07/23 11:11 AM   Specimen: Anterior Nasal Swab  Result Value Ref Range Status   SARS Coronavirus 2 by RT PCR NEGATIVE NEGATIVE Final   Influenza A by PCR NEGATIVE NEGATIVE Final   Influenza B by PCR NEGATIVE NEGATIVE Final    Comment: (NOTE) The Xpert Xpress SARS-CoV-2/FLU/RSV plus assay is  intended as an aid in the diagnosis of influenza from Nasopharyngeal swab specimens and should not be used as a sole basis for treatment. Nasal washings and aspirates are unacceptable for Xpert Xpress SARS-CoV-2/FLU/RSV testing.  Fact Sheet for Patients: bloggercourse.com  Fact Sheet for Healthcare Providers: seriousbroker.it  This test is not yet approved or cleared by the [place]  FDA and has been authorized for detection and/or diagnosis of SARS-CoV-2 by FDA under an Emergency Use Authorization (EUA). This EUA will remain in effect (meaning this test can be used) for the duration of the COVID-19 declaration under Section 564(b)(1) of the Act, 21 U.S.C. section 360bbb-3(b)(1), unless the authorization is terminated or revoked.  Resp Syncytial Virus by PCR NEGATIVE NEGATIVE Final    Comment: (NOTE) Fact Sheet for Patients: bloggercourse.com  Fact Sheet for Healthcare Providers: seriousbroker.it  This test is not yet approved or cleared by the [place]  FDA and has been authorized for detection and/or diagnosis of SARS-CoV-2 by FDA under an Emergency Use Authorization (EUA). This EUA will remain in effect (meaning this test can be used) for the duration of the COVID-19 declaration under Section 564(b)(1) of the Act, 21 U.S.C. section 360bbb-3(b)(1), unless the authorization is terminated or revoked.  Performed at [facility], [address] [place], [place] [address]   Culture, blood (routine x 2)     Status: None   Collection Time: 12/07/23  1:28 PM   Specimen: BLOOD RIGHT HAND  Result Value Ref Range Status   Specimen Description BLOOD RIGHT HAND  Final   Special Requests   Final    BOTTLES DRAWN AEROBIC ONLY Blood Culture results may not be optimal due to an inadequate volume of blood received in culture bottles   Culture   Final    NO GROWTH 5  DAYS Performed at [facility], [address], [place], [place] [address]    Report Status 12/12/2023 FINAL  Final  Culture, blood (routine x 2)     Status: None   Collection Time: 12/07/23  1:28 PM   Specimen: BLOOD LEFT ARM  Result Value Ref Range Status   Specimen Description BLOOD LEFT ARM  Final   Special Requests   Final    BOTTLES DRAWN AEROBIC AND ANAEROBIC Blood Culture adequate volume   Culture   Final    NO GROWTH 5 DAYS Performed at [facility], [address], [place], [place] [address]    Report Status 12/12/2023 FINAL  Final     Radiology Studies: No results found.  Scheduled Meds:  acetaminophen   500 mg Oral Q6H   Chlorhexidine  Gluconate Cloth  6 each Topical Daily   collagenase   1 Application Topical Daily   diltiazem   120 mg Oral Daily   feeding supplement  237 mL Oral BID BM   liver oil-zinc  oxide   Topical BID   memantine   5 mg Oral Daily   pantoprazole   40 mg Oral Daily   potassium chloride   10 mEq Oral Once   QUEtiapine   25 mg Oral QHS   Rivaroxaban   15 mg Oral QPM   sodium chloride  flush  3 mL Intravenous Q12H   Continuous Infusions:   LOS: 0 days   Time spent: 25 minutes  MDALA-GAUSI, GOLDEN PILLOW, MD  [facility]  12/14/2023, 1:19 PM

## 2023-12-14 NOTE — Discharge Summary (Shared)
 Physician Discharge Summary   Patient: Christine [name] MRN: [id number] DOB: [birth year]  Admit date:     12/07/2023  Discharge date: {dischdate:26783}  Discharge Physician: ***   PCP: Shayne Anes, MD   Recommendations at discharge:  {Tip this will not be part of the note when signed- Example include specific recommendations for outpatient follow-up, pending tests to follow-up on. (Optional):26781}  ***  Discharge Diagnoses: Principal Problem:   Acute encephalopathy Active Problems:   Paroxysmal atrial fibrillation (HCC)   Essential hypertension   Anemia   Dementia without behavioral disturbance (HCC)   Acute urinary retention   Nodule of apex of left lung   Prolonged QT interval  Resolved Problems:   * No resolved hospital problems. *  Hospital Course: 89 y.o. woman with PMH of dementia with behavioral disturbance, coronary artery disease, atrial fibrillation on Xarelto , recurrent UTIs, recurrent falls, and recent admission with urinary retention and bilateral hydronephrosis who now returns with inability to ambulate and increased confusion after another fall.  Patient was recently hospitalized, found to have urinary retention with bilateral hydronephrosis. She had Foley catheter placed, failed a voiding trial, and was discharged with Foley. PT had recommended that she be discharged to an SNF but she returned to her ALF at the daughter's request. She fell again shortly after discharge, PT recommending SNF.  The hospital course is in problem-based format below:    Acute encephalopathy, resolved  - Presented with acute worsening of her confusion initially, but per daughter patient is back to her baseline now  - confusion most likely from dehydration and pain in her right arm/hand, less likely to be infectious related. Right hand xrays negative for fracture, showed osteoarthritis. - continued delirium precautions - pain control with scheduled tylenol  and PRN tramadol , hasn't needed  any.     Dementia with behavioral disturbance  - Held Aricept  given prolonged QT, continued Namenda , Seroquel , as-needed Ativan , and Depakote .   Prolonged QT interval  - avoid QT-prolonging medications      PAF  - Continued diltiazem  and Xarelto      Urinary retention  - Hydronephrosis has improved on CT in ED, will continue Foley for now   - follow up with urology as outpatient    Anemia  - Appears stable  - Hgb 9.0   Lung nodule  - Outpatient follow-up advised    Deconditioning/weakness - SNF recommended.     {Tip this will not be part of the note when signed Body mass index is 20.66 kg/m. , ,  Active Pressure Injury/Wound(s)     None          (Optional):26781}  {(NOTE) Pain control PDMP Statment (Optional):26782} Consultants: *** Procedures performed: ***  Disposition: {Plan; Disposition:26390} Diet recommendation:  {Diet_Plan:26776} DISCHARGE MEDICATION: Allergies as of 12/14/2023       Reactions   Ambien  [zolpidem  Tartrate] Other (See Comments)    Hallucinations. Allergy not listed on MAR    Celexa [citalopram] Other (See Comments)   Per patient, made the inside and outside of skin burn. Allergy not listed on MAR    Cymbalta [duloxetine Hcl] Other (See Comments)   Allergy not listed on MAR    Ditropan Xl [oxybutynin Chloride] Nausea Only, Swelling, Other (See Comments)   Throat swelling, Burning eyelids. Allergy not listed on MAR    Lescol [fluvastatin] Other (See Comments)   Myalgias. Allergy not listed on MAR    Lipitor [atorvastatin] Other (See Comments)   Constipation, Hot flashes, Left-leaning gait. Allergy not listed  on [facility]    Lyrica  [pregabalin ] Other (See Comments)   Made patient feel funny. Allergy not listed on MAR    Neurontin [gabapentin] Other (See Comments)   Hallucinations. Allergy not listed on MAR    Pravastatin Other (See Comments)   Myalgias. Allergy not listed on MAR    Tape Other (See Comments)   Skin sensitive - tears  and bruises easily. Allergy not listed on MAR    Zetia [ezetimibe] Other (See Comments)   Myalgias. Allergy not listed on MAR    Zocor [simvastatin] Other (See Comments)   Myalgias. Allergy not listed on MAR      Med Rec must be completed prior to using this [facility]***       Discharge Exam: Filed Weights   12/12/23 0009  Weight: 54.6 kg   Physical Exam on Day of Discharge   General: Alert, awake, responsive Oral cavity: moist mucous membranes  Neck: supple  Chest: clear to auscultation. No crackles, no wheezes  CVS: S1,S2 RRR. No murmurs  Abd: No distention, soft, non-tender. No masses palpable  Extr: No edema    Condition at discharge: {DC Condition:26389}  The results of significant diagnostics from this hospitalization (including imaging, microbiology, ancillary and laboratory) are listed below for reference.   Imaging Studies: DG Hand 2 View Right Result Date: 12/07/2023 CLINICAL DATA:  Fall.  Right hand pain. EXAM: RIGHT HAND - 2 VIEW COMPARISON:  None Available. FINDINGS: There is no evidence of acute fracture or dislocation. Severe osteoarthritis of the interphalangeal joints of the second through fifth fingers. No bony lesions or destruction. Soft tissues are unremarkable. IMPRESSION: No acute findings. Severe osteoarthritis of the interphalangeal joints of the second through fifth fingers. Electronically Signed   By: Marcey Moan M.D.   On: 12/07/2023 18:05   CT CHEST ABDOMEN PELVIS W CONTRAST Result Date: 12/07/2023 CLINICAL DATA:  Blunt trauma. EXAM: CT CHEST, ABDOMEN, AND PELVIS WITH CONTRAST TECHNIQUE: Multidetector CT imaging of the chest, abdomen and pelvis was performed following the standard protocol during bolus administration of intravenous contrast. RADIATION DOSE REDUCTION: This exam was performed according to the departmental dose-optimization program which includes automated exposure control, adjustment of the mA and/or kV according to patient size  and/or use of iterative reconstruction technique. CONTRAST:  75mL OMNIPAQUE  IOHEXOL  350 MG/ML SOLN COMPARISON:  Abdominopelvic CT 11/24/2023 FINDINGS: CT CHEST FINDINGS Cardiovascular: No evidence of acute aortic or vascular injury. Aortic atherosclerosis. Upper normal heart size. There are coronary artery calcifications. No pericardial effusion. Mediastinum/Nodes: No mediastinal hemorrhage. No adenopathy. Unremarkable esophagus. No visible thyroid  nodule. Lungs/Pleura: No pneumothorax. Dependent atelectasis in both lower lobes, left greater than right. Trace left pleural thickening. Benign calcified granuloma in the left lower lobe. Irregular part solid left apical pulmonary nodule with ill-defined margins. Solid component measures approximately 7 x 5 x 10 mm, series 4, image 35. Breathing motion artifact. Musculoskeletal: No acute fracture of the ribs, included clavicles or shoulder girdles. Mild scoliotic curvature of the thoracic spine, no acute fracture. CT ABDOMEN PELVIS FINDINGS Hepatobiliary: No hepatic injury or perihepatic hematoma. Scattered hepatic cysts, stable from prior exam. Gallbladder is distended without inflammation. Pancreas: No evidence of injury. No ductal dilatation or inflammation. Spleen: No splenic injury or perisplenic hematoma. Adrenals/Urinary Tract: No adrenal hemorrhage or renal injury identified. Improved hydronephrosis from prior exam with persistent dilatation of the renal collecting systems. Improved hydroureter, near completely resolved. Redemonstrated right renal cyst. Nonobstructing left renal calculi. No further follow-up imaging is recommended. Foley catheter within the  urinary bladder which is only minimally distended. Air in the bladder typically related to Foley. Stomach/Bowel: No evidence of bowel injury. No mesenteric hematoma. No bowel wall thickening or inflammation. Colonic diverticulosis without diverticulitis. Moderate colonic stool burden. Normal appendix.  Vascular/Lymphatic: No vascular injury. No retroperitoneal fluid. Abdominal aortic atherosclerosis. No suspicious lymphadenopathy. Reproductive: Status post hysterectomy. No adnexal masses. Other: No free fluid or free air. Musculoskeletal: Left hip arthroplasty creates regional streak artifact. Degenerative change in the lumbar spine without acute fracture. No pelvic fracture, with special attention to the left pubic bone with questionable lucency on radiograph. IMPRESSION: 1. No evidence of acute traumatic injury to the chest, abdomen, or pelvis. 2. Irregular part solid left apical pulmonary nodule with ill-defined margins. Solid component measures approximately 7 x 5 x 10 mm. Differential considerations include scarring or neoplasm. Recommend elective pulmonary consultation. 3. Improved hydronephrosis from prior exam with persistent dilatation of the renal collecting systems. Improved hydroureter, near completely resolved. 4. Colonic diverticulosis without diverticulitis. Aortic Atherosclerosis (ICD10-I70.0). Electronically Signed   By: Andrea Gasman M.D.   On: 12/07/2023 16:09   DG Pelvis 1-2 Views Result Date: 12/07/2023 EXAM: 1 or 2 VIEW(S) XRAY OF THE PELVIS 12/07/2023 12:21:00 PM COMPARISON: None available. CLINICAL HISTORY: 809823 Fall 190176 FINDINGS: BONES AND JOINTS: Left hip arthroplasty noted. Pubic symphysis degenerative spurring. A vertical lucency projects over the left pubic bone. If the patient has point tenderness along the pubis slightly eccentric to the left, then consider dedicated CT pelvis to further assess for pubic bone or pelvic fracture. No joint dislocation. SOFT TISSUES: The soft tissues are unremarkable. IMPRESSION: 1. Vertical lucency over the left pubic bone; if there is point tenderness to palpation along the pubis slightly eccentric to the left, consider dedicated CT pelvis to further assess for pubic or pelvic fracture. 2. Pubic symphysis degenerative spurring. 3. Left hip  arthroplasty. Electronically signed by: Ryan Salvage MD 12/07/2023 01:04 PM EST RP Workstation: [id number]   DG Shoulder Right Result Date: 12/07/2023 EXAM: 1 VIEW(S) XRAY OF THE RIGHT SHOULDER 12/07/2023 12:21:00 PM COMPARISON: None available. CLINICAL HISTORY: fall, right arm pain. FINDINGS: BONES AND JOINTS: Glenohumeral joint is normally aligned. No acute fracture or dislocation. The [facility] joint is unremarkable in appearance. SOFT TISSUES: No abnormal calcifications. Visualized lung is unremarkable. IMPRESSION: 1. No acute fracture or dislocation. Electronically signed by: Ryan Salvage MD 12/07/2023 01:01 PM EST RP Workstation: [id number]   DG Foot Complete Left Result Date: 12/07/2023 EXAM: 3 OR MORE VIEW(S) XRAY OF THE LEFT FOOT 12/07/2023 12:21:00 PM COMPARISON: None available. CLINICAL HISTORY: fall FINDINGS: BONES AND JOINTS: No acute fracture. No focal osseous lesion. No joint dislocation. Mild hallux valgus deformity. Osteoarthritis. SOFT TISSUES: Mild soft tissue swelling dorsally along the distal forefoot. IMPRESSION: 1. No acute fracture or dislocation. 2. Mild hallux valgus deformity and osteoarthritis. 3. Mild soft tissue swelling dorsally along the distal forefoot. Electronically signed by: Ryan Salvage MD 12/07/2023 01:00 PM EST RP Workstation: [id number]   DG Elbow Complete Right Result Date: 12/07/2023 EXAM: 3 VIEW(S) XRAY OF THE ELBOW COMPARISON: None available. CLINICAL HISTORY: fall FINDINGS: BONES AND JOINTS: No acute fracture. No focal osseous lesion. No joint dislocation. No joint effusion. SOFT TISSUES: The soft tissues are unremarkable. IMPRESSION: 1. No acute abnormality. Electronically signed by: Ryan Salvage MD 12/07/2023 12:59 PM EST RP Workstation: [id number]   DG Chest 1 View Result Date: 12/07/2023 EXAM: 1 VIEW(S) XRAY OF THE CHEST 12/07/2023 12:21:00 PM COMPARISON: 11/24/2023 CLINICAL HISTORY: Fall [id number] FINDINGS: LUNGS AND PLEURA:  Leftward rotated  frontal projection. Trace left pleural effusion. Left retrocardiac basilar airspace opacity could reflect atelectasis or pneumonia, follow up chest radiography recommended to ensure clearance after treatment. No pneumothorax. HEART AND MEDIASTINUM: Atherosclerotic calcifications. BONES AND SOFT TISSUES: No acute osseous abnormality. IMPRESSION: 1. Left retrocardiac basilar airspace opacity, possibly atelectasis or pneumonia, with follow-up chest radiography recommended to ensure clearance after treatment. 2. Trace left pleural effusion. Electronically signed by: Ryan Salvage MD 12/07/2023 12:58 PM EST RP Workstation: [id number]   CT Head Wo Contrast Result Date: 12/07/2023 CLINICAL DATA:  Mental status change.  Fall. EXAM: CT HEAD WITHOUT CONTRAST CT CERVICAL SPINE WITHOUT CONTRAST TECHNIQUE: Multidetector CT imaging of the head and cervical spine was performed following the standard protocol without intravenous contrast. Multiplanar CT image reconstructions of the cervical spine were also generated. RADIATION DOSE REDUCTION: This exam was performed according to the departmental dose-optimization program which includes automated exposure control, adjustment of the mA and/or kV according to patient size and/or use of iterative reconstruction technique. COMPARISON:  11/24/2023, 11/07/2023 FINDINGS: CT HEAD FINDINGS Brain: There is mild age related atrophic changes the ventricles, cisterns and other CSF spaces are otherwise unchanged. Mild chronic ischemic microvascular disease is present. No mass, mass effect, shift of midline structures or acute hemorrhage. Vascular: No hyperdense vessel or unexpected calcification. Skull: Normal. Negative for fracture or focal lesion. Sinuses/Orbits: Orbits are normal. Paranasal sinuses are well developed and are clear. Mastoid air cells are clear. Other: None. CT CERVICAL SPINE FINDINGS Alignment: No posttraumatic subluxation. Skull base and vertebrae: Mild spondylosis of  the cervical spine to include uncovertebral joint spurring and facet arthropathy. Vertebral body heights within the cervical spine are maintained. Minimal left-sided neural foraminal narrowing at the C5-6 level mild bilateral neural foraminal narrowing at the C6-7 level. No acute fracture. Soft tissues and spinal canal: Soft tissues are normal. Borderline canal stenosis at the C6-7 level. Disc levels: Disc space narrowing from the C4-5 level to the C6-7 level. Upper chest: No acute findings. Stable 1.2 cm subsolid nodule over the left apex. Other: None. IMPRESSION: 1. No acute brain injury. 2. Mild age related atrophic changes and chronic ischemic microvascular disease. 3. No acute cervical spine injury. 4. Mild spondylosis of the cervical spine with disc disease from the C4-5 level to the C6-7 level. Minimal left-sided neural foraminal narrowing at the C5-6 level and mild bilateral neural foraminal narrowing at the C6-7 level. Borderline canal stenosis at the C6-7 level. 5. Stable 1.2 cm subsolid nodule over the left apex. Continues to recommend further evaluation with noncontrast chest CT as previously recommended. Electronically Signed   By: Toribio Agreste M.D.   On: 12/07/2023 12:01   CT Cervical Spine Wo Contrast Result Date: 12/07/2023 CLINICAL DATA:  Mental status change.  Fall. EXAM: CT HEAD WITHOUT CONTRAST CT CERVICAL SPINE WITHOUT CONTRAST TECHNIQUE: Multidetector CT imaging of the head and cervical spine was performed following the standard protocol without intravenous contrast. Multiplanar CT image reconstructions of the cervical spine were also generated. RADIATION DOSE REDUCTION: This exam was performed according to the departmental dose-optimization program which includes automated exposure control, adjustment of the mA and/or kV according to patient size and/or use of iterative reconstruction technique. COMPARISON:  11/24/2023, 11/07/2023 FINDINGS: CT HEAD FINDINGS Brain: There is mild age related  atrophic changes the ventricles, cisterns and other CSF spaces are otherwise unchanged. Mild chronic ischemic microvascular disease is present. No mass, mass effect, shift of midline structures or acute hemorrhage. Vascular: No hyperdense vessel or unexpected  calcification. Skull: Normal. Negative for fracture or focal lesion. Sinuses/Orbits: Orbits are normal. Paranasal sinuses are well developed and are clear. Mastoid air cells are clear. Other: None. CT CERVICAL SPINE FINDINGS Alignment: No posttraumatic subluxation. Skull base and vertebrae: Mild spondylosis of the cervical spine to include uncovertebral joint spurring and facet arthropathy. Vertebral body heights within the cervical spine are maintained. Minimal left-sided neural foraminal narrowing at the C5-6 level mild bilateral neural foraminal narrowing at the C6-7 level. No acute fracture. Soft tissues and spinal canal: Soft tissues are normal. Borderline canal stenosis at the C6-7 level. Disc levels: Disc space narrowing from the C4-5 level to the C6-7 level. Upper chest: No acute findings. Stable 1.2 cm subsolid nodule over the left apex. Other: None. IMPRESSION: 1. No acute brain injury. 2. Mild age related atrophic changes and chronic ischemic microvascular disease. 3. No acute cervical spine injury. 4. Mild spondylosis of the cervical spine with disc disease from the C4-5 level to the C6-7 level. Minimal left-sided neural foraminal narrowing at the C5-6 level and mild bilateral neural foraminal narrowing at the C6-7 level. Borderline canal stenosis at the C6-7 level. 5. Stable 1.2 cm subsolid nodule over the left apex. Continues to recommend further evaluation with noncontrast chest CT as previously recommended. Electronically Signed   By: Toribio Agreste M.D.   On: 12/07/2023 12:01   DG CHEST PORT 1 VIEW Result Date: 11/24/2023 EXAM: 1 VIEW(S) XRAY OF THE CHEST 11/24/2023 08:46:00 PM COMPARISON: 11/07/2023 CLINICAL HISTORY: Confusion FINDINGS:  LUNGS AND PLEURA: Left basilar patchy opacity. Trace left pleural effusion. No pneumothorax. HEART AND MEDIASTINUM: Atherosclerotic calcifications. No acute abnormality of the cardiac and mediastinal silhouettes. BONES AND SOFT TISSUES: No acute osseous abnormality. IMPRESSION: 1. Left basilar patchy opacity, which may reflect infection or atelectasis. 2. Trace left pleural effusion. Electronically signed by: Morgane Naveau MD 11/24/2023 08:54 PM EST RP Workstation: [id number]   CT ABDOMEN PELVIS W CONTRAST Result Date: 11/24/2023 EXAM: CT ABDOMEN AND PELVIS WITH CONTRAST 11/24/2023 04:26:51 PM TECHNIQUE: CT of the abdomen and pelvis was performed with the administration of 80 mL iohexol  (OMNIPAQUE ) 300 MG/ML solution. Multiplanar reformatted images are provided for review. Automated exposure control, iterative reconstruction, and/or weight-based adjustment of the mA/kV was utilized to reduce the radiation dose to as low as reasonably achievable. COMPARISON: None available. CLINICAL HISTORY: Abdominal pain, confusion; fall; inability to walk FINDINGS: LOWER CHEST: Coronary and descending thoracic aortic atherosclerotic vascular calcifications. Calcified granuloma posteriorly in the left lower lobe. Mild dependent atelectasis in both lower lobes. Mild cardiomegaly. LIVER: Stable hypodense liver lesions, likely cysts, warranting no further imaging workup. GALLBLADDER AND BILE DUCTS: Gallbladder is unremarkable. No biliary ductal dilatation. SPLEEN: No acute abnormality. PANCREAS: No acute abnormality. ADRENAL GLANDS: No acute abnormality. KIDNEYS, URETERS AND BLADDER: 2.7 cm simple right mid kidney cyst warrants no further imaging workup. Prominent bilateral hydronephrosis and moderate bilateral hydroureter extending down to the distended urinary bladder. The urinary bladder measures 17.2 x 10.8 x 11.4 cm, with a calculated volume of 1109 ml. Streak artifact in the patient's left hip implant partially obscures the  vicinity of the urethra. The appearance raises concern for possible bladder outlet or urethral obstruction. No urinary tract calculi identified. No perinephric or periureteral stranding. GI AND BOWEL: Stomach demonstrates no acute abnormality. There is no bowel obstruction. No current findings of bowel distress such as pneumatosis, bowel wall thickening, or mesenteric edema. Low position of the anorectal junction suggesting pelvic floor laxity. PERITONEUM AND RETROPERITONEUM: No ascites. No free  air. VASCULATURE: Considerable abdominal aortic atheromatous vascular calcification noted. Substantial atheromatous plaque severely narrowing the proximal superior mesenteric artery; poor visualization of the proximal celiac trunk which is likely occluded, with reconstitution via collaterals from the superior mesenteric artery. This predisposes the patient to mesenteric ischemia including chronic mesenteric ischemia. LYMPH NODES: No lymphadenopathy. REPRODUCTIVE ORGANS: Uterus absent. The possibility of mucosal thickening along the vaginal vestibule is not excluded. BONES AND SOFT TISSUES: Loss of disc height at L4-L5 with lower lumbar spondylosis. 3 mm grade 1 degenerative anterolisthesis at L5-S1. Left total hip prosthesis. No focal soft tissue abnormality. IMPRESSION: 1. Prominent bilateral hydronephrosis and moderate bilateral hydroureter to a markedly distended urinary bladder, concerning for bladder outlet or urethral obstruction; no urinary tract calculi identified. 2. 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 3. Loss of disc height at L4-L5 with lower lumbar spondylosis. 4. Grade 1 degenerative anterolisthesis at L5-S1. Electronically signed by: Ryan Salvage MD 11/24/2023 05:03 PM EST RP Workstation: [id number]   CT Head Wo Contrast Result Date:  11/24/2023 EXAM: CT HEAD WITHOUT CONTRAST 11/24/2023 04:26:51 PM TECHNIQUE: CT of the head was performed without the administration of intravenous contrast. Automated exposure control, iterative reconstruction, and/or weight based adjustment of the mA/kV was utilized to reduce the radiation dose to as low as reasonably achievable. COMPARISON: CT head without contrast 11/07/2023. MR head without contrast 11/07/2023. CLINICAL HISTORY: Delirium FINDINGS: BRAIN AND VENTRICLES: Moderate generalized cerebral and cerebellar volume loss. Moderate periventricular and deep cerebral white matter disease. Atherosclerotic calcifications in cavernous internal carotid arteries. No acute hemorrhage. No evidence of acute infarct. No hydrocephalus. No extra-axial collection. No mass effect or midline shift. ORBITS: Bilateral lens replacement. SINUSES: Mucosal thickening in bilateral ethmoid sinuses. Trace left mastoid effusion. SOFT TISSUES AND SKULL: No acute soft tissue abnormality. No skull fracture. IMPRESSION: 1. No acute intracranial abnormality. 2. Stable moderate  generalized cerebral and cerebellar volume loss. 3. Stable moderate periventricular and deep cerebral white matter disease. Electronically signed by: Lonni Necessary MD 11/24/2023 04:44 PM EST RP Workstation: [id number]   CT Cervical Spine Wo Contrast Result Date: 11/24/2023 EXAM: CT CERVICAL SPINE WITH CONTRAST 11/24/2023 04:26:51 PM TECHNIQUE: CT of the cervical spine was performed with the administration of 80 mL of iohexol  (OMNIPAQUE ) 300 MG/ML solution. Multiplanar reformatted images are provided for review. Automated exposure control, iterative reconstruction, and/or weight based adjustment of the mA/kV was utilized to reduce the radiation dose to as low as reasonably achievable. COMPARISON: CT cervical spine 11/30/2023. CLINICAL HISTORY: Neck trauma (Age >= 65y); reports leg pain. Fall. Increased confusion. FINDINGS: CERVICAL SPINE: BONES AND  ALIGNMENT: No acute fracture or traumatic malalignment. Ankylosis of the cervical spine is present at C2-C3 and C4-C5. Slight degenerative anterolisthesis present at C4-C5. Straightening of the normal cervical lordosis is present. DEGENERATIVE CHANGES: Ankylosis of the cervical spine is present at C2-C3 and C4-C5. Slight degenerative anterolisthesis present at C4-C5. Straightening of the normal cervical lordosis is present. SOFT TISSUES: No prevertebral soft tissue swelling. Atherosclerotic calcifications are present at the carotid bifurcations bilaterally without significant stenosis. IMPRESSION: 1. No acute abnormality of the cervical spine related to the reported neck trauma. 2. Ankylosis of the cervical spine at C2-3 and C4-5 with slight degenerative anterolisthesis at C4-5. 3. Straightening of the normal cervical lordosis. Electronically signed by: Lonni Necessary MD 11/24/2023 04:42 PM EST RP Workstation: [id number]   VAS US  LOWER EXTREMITY VENOUS (DVT) (ONLY  [facility]) Result Date: 11/24/2023  Lower Venous DVT Study Patient Name:  DAJIA GUNNELS Mccaslin  Date of Exam:   11/24/2023 Medical Rec #: [id number]        Accession #:    [id number] Date of Birth: [birth year]        Patient Gender: F Patient Age:   82 years Exam Location:  [facility] Procedure:      VAS US  LOWER EXTREMITY VENOUS (DVT) Referring Phys: ROCKY MASSY --------------------------------------------------------------------------------  Indications: Pain.  Risk Factors: None identified. Limitations: Poor ultrasound/tissue interface and patient positioning, patient immobility, patient pain tolerance. Comparison Study: No prior studies. Performing Technologist: Cordella Collet RVT  Examination Guidelines: A complete evaluation includes B-mode imaging, spectral Doppler, color Doppler, and power Doppler as needed of all accessible portions of each vessel. Bilateral testing is considered an integral part of a complete examination. Limited  examinations for reoccurring indications may be performed as noted. The reflux portion of the exam is performed with the patient in reverse Trendelenburg.  +---------+---------------+---------+-----------+----------+--------------+ RIGHT    CompressibilityPhasicitySpontaneityPropertiesThrombus Aging +---------+---------------+---------+-----------+----------+--------------+ CFV      Full           Yes      Yes                                 +---------+---------------+---------+-----------+----------+--------------+ SFJ      Full                                                        +---------+---------------+---------+-----------+----------+--------------+ FV Prox  Full                                                        +---------+---------------+---------+-----------+----------+--------------+ FV Mid   Full                                                        +---------+---------------+---------+-----------+----------+--------------+ FV Distal               Yes      Yes                                 +---------+---------------+---------+-----------+----------+--------------+ PFV      Full                                                        +---------+---------------+---------+-----------+----------+--------------+ POP      Full           Yes      Yes                                 +---------+---------------+---------+-----------+----------+--------------+  PTV      Full                                                        +---------+---------------+---------+-----------+----------+--------------+ PERO     Full                                                        +---------+---------------+---------+-----------+----------+--------------+   +---------+---------------+---------+-----------+----------+-------------------+ LEFT     CompressibilityPhasicitySpontaneityPropertiesThrombus Aging       +---------+---------------+---------+-----------+----------+-------------------+ CFV      Full           Yes      Yes                                      +---------+---------------+---------+-----------+----------+-------------------+ SFJ      Full                                                             +---------+---------------+---------+-----------+----------+-------------------+ FV Prox  Full                                                             +---------+---------------+---------+-----------+----------+-------------------+ FV Mid   Full                                                             +---------+---------------+---------+-----------+----------+-------------------+ FV Distal               Yes      Yes                                      +---------+---------------+---------+-----------+----------+-------------------+ PFV      Full                                                             +---------+---------------+---------+-----------+----------+-------------------+ POP      Full           Yes      Yes                                      +---------+---------------+---------+-----------+----------+-------------------+ PTV  Full                                                             +---------+---------------+---------+-----------+----------+-------------------+ PERO                                                  Not well visualized +---------+---------------+---------+-----------+----------+-------------------+     Summary: RIGHT: - There is no evidence of deep vein thrombosis in the lower extremity. However, portions of this examination were limited- see technologist comments above.  - No cystic structure found in the popliteal fossa.  LEFT: - There is no evidence of deep vein thrombosis in the lower extremity. However, portions of this examination were limited- see technologist comments above.  - No cystic  structure found in the popliteal fossa.  *See table(s) above for measurements and observations. Electronically signed by Lonni Gaskins MD on 11/24/2023 at 4:29:06 PM.    Final     Microbiology: Results for orders placed or performed during the hospital encounter of 12/07/23  Resp panel by RT-PCR (RSV, Flu A&B, Covid) Anterior Nasal Swab     Status: None   Collection Time: 12/07/23 11:11 AM   Specimen: Anterior Nasal Swab  Result Value Ref Range Status   SARS Coronavirus 2 by RT PCR NEGATIVE NEGATIVE Final   Influenza A by PCR NEGATIVE NEGATIVE Final   Influenza B by PCR NEGATIVE NEGATIVE Final    Comment: (NOTE) The Xpert Xpress SARS-CoV-2/FLU/RSV plus assay is intended as an aid in the diagnosis of influenza from Nasopharyngeal swab specimens and should not be used as a sole basis for treatment. Nasal washings and aspirates are unacceptable for Xpert Xpress SARS-CoV-2/FLU/RSV testing.  Fact Sheet for Patients: bloggercourse.com  Fact Sheet for Healthcare Providers: seriousbroker.it  This test is not yet approved or cleared by the [place]  FDA and has been authorized for detection and/or diagnosis of SARS-CoV-2 by FDA under an Emergency Use Authorization (EUA). This EUA will remain in effect (meaning this test can be used) for the duration of the COVID-19 declaration under Section 564(b)(1) of the Act, 21 U.S.C. section 360bbb-3(b)(1), unless the authorization is terminated or revoked.     Resp Syncytial Virus by PCR NEGATIVE NEGATIVE Final    Comment: (NOTE) Fact Sheet for Patients: bloggercourse.com  Fact Sheet for Healthcare Providers: seriousbroker.it  This test is not yet approved or cleared by the [place]  FDA and has been authorized for detection and/or diagnosis of SARS-CoV-2 by FDA under an Emergency Use Authorization (EUA). This EUA will remain in  effect (meaning this test can be used) for the duration of the COVID-19 declaration under Section 564(b)(1) of the Act, 21 U.S.C. section 360bbb-3(b)(1), unless the authorization is terminated or revoked.  Performed at [facility], [address], [place], [place] [address]   Culture, blood (routine x 2)     Status: None   Collection Time: 12/07/23  1:28 PM   Specimen: BLOOD RIGHT HAND  Result Value Ref Range Status   Specimen Description BLOOD RIGHT HAND  Final   Special Requests   Final    BOTTLES DRAWN AEROBIC ONLY Blood Culture results may not  be optimal due to an inadequate volume of blood received in culture bottles   Culture   Final    NO GROWTH 5 DAYS Performed at [facility], [address] [place], [place] [address]    Report Status 12/12/2023 FINAL  Final  Culture, blood (routine x 2)     Status: None   Collection Time: 12/07/23  1:28 PM   Specimen: BLOOD LEFT ARM  Result Value Ref Range Status   Specimen Description BLOOD LEFT ARM  Final   Special Requests   Final    BOTTLES DRAWN AEROBIC AND ANAEROBIC Blood Culture adequate volume   Culture   Final    NO GROWTH 5 DAYS Performed at [facility], [address] [place], [place] [address]    Report Status 12/12/2023 FINAL  Final    Labs: CBC: Recent Labs  Lab 12/07/23 1341 12/08/23 0435 12/09/23 0445 12/10/23 0546 12/11/23 0604  WBC  --  6.4 4.2 4.1 3.4*  HGB 9.9* 8.8* 8.7* 8.5* 9.0*  HCT 29.0* 26.5* 26.8* 26.4* 27.8*  MCV  --  86.0 87.6 86.8 87.1  PLT  --  257 236 233 226   Basic Metabolic Panel: Recent Labs  Lab 12/07/23 1341 12/08/23 0435 12/09/23 0445 12/10/23 0546 12/11/23 0604  NA 140 142 141 141 139  K 3.9 3.7 3.9 3.7 4.0  CL 104 104 109 106 109  CO2  --  22 23 24 23   GLUCOSE 100* 105* 95 101* 92  BUN 24* 20 21 17 16   CREATININE 0.80 0.67 0.70 0.76 0.69  CALCIUM   --  8.6* 8.1* 8.3* 8.1*  MG  --  1.9  --   --   --   PHOS  --  4.0  --   --   --    Liver Function  Tests: No results for input(s): AST, ALT, ALKPHOS, BILITOT, PROT, ALBUMIN in the last 168 hours. CBG: No results for input(s): GLUCAP in the last 168 hours.  Discharge time spent: {LESS THAN/GREATER UYJW:73611} 30 minutes.  Signed: *** [facility] ***

## 2023-12-14 NOTE — Plan of Care (Signed)
 ?  Problem: Health Behavior/Discharge Planning: ?Goal: Ability to manage health-related needs will improve ?Outcome: Adequate for Discharge ?  ?Problem: Clinical Measurements: ?Goal: Ability to maintain clinical measurements within normal limits will improve ?Outcome: Adequate for Discharge ?Goal: Will remain free from infection ?Outcome: Adequate for Discharge ?Goal: Diagnostic test results will improve ?Outcome: Adequate for Discharge ?Goal: Respiratory complications will improve ?Outcome: Adequate for Discharge ?Goal: Cardiovascular complication will be avoided ?Outcome: Adequate for Discharge ?  ?

## 2023-12-14 NOTE — TOC Progression Note (Addendum)
 Transition of Care [facility]) - Progression Note    Patient Details  Name: ARYANNA Dubuc MRN: [id number] Date of Birth: 1933-11-18  [facility] CM/SW Contact  Sherline Clack, [place] Phone Number: 12/14/2023, 9:02 AM  Clinical Narrative:     Update 1:12 PM: CSW received call back from [place] with [employer]. CSW completed insurance auth and is waiting for insurance decision.   Tanya Cooper with [facility] came to visit patient yesterday and let CSW know [facility] is offering a bed for patient. CSW attempted to start insurance auth with [employer] and left VM with callback number. CSW will continue to follow.   Expected Discharge Plan: Skilled Nursing Facility Barriers to Discharge: Insurance Authorization, SNF Pending bed offer               Expected Discharge Plan and Services                                               Social Drivers of Health (SDOH) Interventions SDOH Screenings   Food Insecurity: No Food Insecurity (12/08/2023)  Housing: Unknown (12/08/2023)  Transportation Needs: Unmet Transportation Needs (12/08/2023)  Utilities: Not At Risk (12/08/2023)  Social Connections: Unknown (12/08/2023)  Tobacco Use: Low Risk  (12/07/2023)    Readmission Risk Interventions    11/26/2023    2:47 PM  Readmission Risk Prevention Plan  Transportation Screening Complete  HRI or Home Care Consult Complete  Social Work Consult for Recovery Care Planning/Counseling Complete  Palliative Care Screening Not Applicable  Medication Review Oceanographer) Complete

## 2023-12-15 DIAGNOSIS — F0390 Unspecified dementia without behavioral disturbance: Secondary | ICD-10-CM | POA: Diagnosis not present

## 2023-12-15 DIAGNOSIS — G9340 Encephalopathy, unspecified: Secondary | ICD-10-CM | POA: Diagnosis not present

## 2023-12-15 DIAGNOSIS — I480 Paroxysmal atrial fibrillation: Secondary | ICD-10-CM | POA: Diagnosis not present

## 2023-12-15 DIAGNOSIS — R9431 Abnormal electrocardiogram [ECG] [EKG]: Secondary | ICD-10-CM | POA: Diagnosis not present

## 2023-12-15 DIAGNOSIS — R338 Other retention of urine: Secondary | ICD-10-CM | POA: Diagnosis not present

## 2023-12-15 MED ORDER — TAMSULOSIN HCL 0.4 MG PO CAPS
0.4000 mg | ORAL_CAPSULE | Freq: Every day | ORAL | Status: DC
  Administered 2023-12-15 – 2023-12-17 (×3): 0.4 mg via ORAL
  Filled 2023-12-15 (×3): qty 1

## 2023-12-15 NOTE — Progress Notes (Signed)
 Progress Note   Patient: Christine [name] FMW:[id number] DOB: [birth year] DOA: 12/07/2023  DOS: the patient was seen and examined on 12/15/2023   Brief hospital course:  89 y.o. adult with medical history significant for dementia with behavioral disturbance, coronary artery disease, atrial fibrillation on Xarelto , recurrent UTIs, recurrent falls, and recent admission with urinary retention and bilateral hydronephrosis who now returns with inability to ambulate and increased confusion after another fall.   Assessment and Plan:  Acute metabolic encephalopathy with underlying dementia - Initially presenting with worsening confusion.  Currently appears to be back at baseline.  Likely multifactorial etiology given underlying dementia, urinary retention.  Discontinuing sitter.  Continue Namenda , Seroquel , Depakote .  Acute urinary retention with bilateral hydronephrosis - Noted on CT scan.  Foley catheter placed.  Urine output appears excellent.  Foley will remain in place.  Flomax  initiated today.  Will follow-up with urology in the outpatient setting.  Prolonged QT interval - According QT prolonging medications.  Paroxysmal atrial fibrillation - Xarelto  and diltiazem  on board.  Physical debilitation muscle weakness - SNF recommended.  Will DC sitter as will need to be off sitter observation for 2 days.   Subjective: Patient resting comfortably.  Denies any fever, chills, chest pain, nausea, vomiting, abdominal pain.  Physical Exam:  Vitals:   12/14/23 2043 12/15/23 0454 12/15/23 0726 12/15/23 1127  BP: 131/65 130/68 (!) 111/95 118/66  Pulse: 70 82 78 73  Resp: 18  17 17   Temp: 98.2 F (36.8 C)  97.9 F (36.6 C) 98.2 F (36.8 C)  TempSrc: Oral  Oral Oral  SpO2: 98% 99% 98% 99%  Weight:      Height:        GENERAL:  Alert, pleasant, no acute distress, frail HEENT:  EOMI CARDIOVASCULAR:  RRR, no murmurs appreciated RESPIRATORY:  Clear to auscultation, no wheezing, rales, or  rhonchi GASTROINTESTINAL:  Soft, nontender, nondistended EXTREMITIES:  No LE edema bilaterally NEURO:  No new focal deficits appreciated SKIN:  No rashes noted PSYCH:  Appropriate mood and affect     Data Reviewed:  Imaging Studies: DG Hand 2 View Right Result Date: 12/07/2023 CLINICAL DATA:  Fall.  Right hand pain. EXAM: RIGHT HAND - 2 VIEW COMPARISON:  None Available. FINDINGS: There is no evidence of acute fracture or dislocation. Severe osteoarthritis of the interphalangeal joints of the second through fifth fingers. No bony lesions or destruction. Soft tissues are unremarkable. IMPRESSION: No acute findings. Severe osteoarthritis of the interphalangeal joints of the second through fifth fingers. Electronically Signed   By: Marcey Moan M.D.   On: 12/07/2023 18:05   CT CHEST ABDOMEN PELVIS W CONTRAST Result Date: 12/07/2023 CLINICAL DATA:  Blunt trauma. EXAM: CT CHEST, ABDOMEN, AND PELVIS WITH CONTRAST TECHNIQUE: Multidetector CT imaging of the chest, abdomen and pelvis was performed following the standard protocol during bolus administration of intravenous contrast. RADIATION DOSE REDUCTION: This exam was performed according to the departmental dose-optimization program which includes automated exposure control, adjustment of the mA and/or kV according to patient size and/or use of iterative reconstruction technique. CONTRAST:  75mL OMNIPAQUE  IOHEXOL  350 MG/ML SOLN COMPARISON:  Abdominopelvic CT 11/24/2023 FINDINGS: CT CHEST FINDINGS Cardiovascular: No evidence of acute aortic or vascular injury. Aortic atherosclerosis. Upper normal heart size. There are coronary artery calcifications. No pericardial effusion. Mediastinum/Nodes: No mediastinal hemorrhage. No adenopathy. Unremarkable esophagus. No visible thyroid  nodule. Lungs/Pleura: No pneumothorax. Dependent atelectasis in both lower lobes, left greater than right. Trace left pleural thickening. Benign calcified granuloma in the left  lower  lobe. Irregular part solid left apical pulmonary nodule with ill-defined margins. Solid component measures approximately 7 x 5 x 10 mm, series 4, image 35. Breathing motion artifact. Musculoskeletal: No acute fracture of the ribs, included clavicles or shoulder girdles. Mild scoliotic curvature of the thoracic spine, no acute fracture. CT ABDOMEN PELVIS FINDINGS Hepatobiliary: No hepatic injury or perihepatic hematoma. Scattered hepatic cysts, stable from prior exam. Gallbladder is distended without inflammation. Pancreas: No evidence of injury. No ductal dilatation or inflammation. Spleen: No splenic injury or perisplenic hematoma. Adrenals/Urinary Tract: No adrenal hemorrhage or renal injury identified. Improved hydronephrosis from prior exam with persistent dilatation of the renal collecting systems. Improved hydroureter, near completely resolved. Redemonstrated right renal cyst. Nonobstructing left renal calculi. No further follow-up imaging is recommended. Foley catheter within the urinary bladder which is only minimally distended. Air in the bladder typically related to Foley. Stomach/Bowel: No evidence of bowel injury. No mesenteric hematoma. No bowel wall thickening or inflammation. Colonic diverticulosis without diverticulitis. Moderate colonic stool burden. Normal appendix. Vascular/Lymphatic: No vascular injury. No retroperitoneal fluid. Abdominal aortic atherosclerosis. No suspicious lymphadenopathy. Reproductive: Status post hysterectomy. No adnexal masses. Other: No free fluid or free air. Musculoskeletal: Left hip arthroplasty creates regional streak artifact. Degenerative change in the lumbar spine without acute fracture. No pelvic fracture, with special attention to the left pubic bone with questionable lucency on radiograph. IMPRESSION: 1. No evidence of acute traumatic injury to the chest, abdomen, or pelvis. 2. Irregular part solid left apical pulmonary nodule with ill-defined margins. Solid  component measures approximately 7 x 5 x 10 mm. Differential considerations include scarring or neoplasm. Recommend elective pulmonary consultation. 3. Improved hydronephrosis from prior exam with persistent dilatation of the renal collecting systems. Improved hydroureter, near completely resolved. 4. Colonic diverticulosis without diverticulitis. Aortic Atherosclerosis (ICD10-I70.0). Electronically Signed   By: Andrea Gasman M.D.   On: 12/07/2023 16:09   DG Pelvis 1-2 Views Result Date: 12/07/2023 EXAM: 1 or 2 VIEW(S) XRAY OF THE PELVIS 12/07/2023 12:21:00 PM COMPARISON: None available. CLINICAL HISTORY: 809823 Fall [id number] FINDINGS: BONES AND JOINTS: Left hip arthroplasty noted. Pubic symphysis degenerative spurring. A vertical lucency projects over the left pubic bone. If the patient has point tenderness along the pubis slightly eccentric to the left, then consider dedicated CT pelvis to further assess for pubic bone or pelvic fracture. No joint dislocation. SOFT TISSUES: The soft tissues are unremarkable. IMPRESSION: 1. Vertical lucency over the left pubic bone; if there is point tenderness to palpation along the pubis slightly eccentric to the left, consider dedicated CT pelvis to further assess for pubic or pelvic fracture. 2. Pubic symphysis degenerative spurring. 3. Left hip arthroplasty. Electronically signed by: Ryan Salvage MD 12/07/2023 01:04 PM EST RP Workstation: [id number]   DG Shoulder Right Result Date: 12/07/2023 EXAM: 1 VIEW(S) XRAY OF THE RIGHT SHOULDER 12/07/2023 12:21:00 PM COMPARISON: None available. CLINICAL HISTORY: fall, right arm pain. FINDINGS: BONES AND JOINTS: Glenohumeral joint is normally aligned. No acute fracture or dislocation. The [facility] joint is unremarkable in appearance. SOFT TISSUES: No abnormal calcifications. Visualized lung is unremarkable. IMPRESSION: 1. No acute fracture or dislocation. Electronically signed by: Ryan Salvage MD 12/07/2023 01:01 PM EST RP  Workstation: [id number]   DG Foot Complete Left Result Date: 12/07/2023 EXAM: 3 OR MORE VIEW(S) XRAY OF THE LEFT FOOT 12/07/2023 12:21:00 PM COMPARISON: None available. CLINICAL HISTORY: fall FINDINGS: BONES AND JOINTS: No acute fracture. No focal osseous lesion. No joint dislocation. Mild hallux valgus deformity. Osteoarthritis. SOFT TISSUES: Mild soft  tissue swelling dorsally along the distal forefoot. IMPRESSION: 1. No acute fracture or dislocation. 2. Mild hallux valgus deformity and osteoarthritis. 3. Mild soft tissue swelling dorsally along the distal forefoot. Electronically signed by: Ryan Salvage MD 12/07/2023 01:00 PM EST RP Workstation: [id number]   DG Elbow Complete Right Result Date: 12/07/2023 EXAM: 3 VIEW(S) XRAY OF THE ELBOW COMPARISON: None available. CLINICAL HISTORY: fall FINDINGS: BONES AND JOINTS: No acute fracture. No focal osseous lesion. No joint dislocation. No joint effusion. SOFT TISSUES: The soft tissues are unremarkable. IMPRESSION: 1. No acute abnormality. Electronically signed by: Ryan Salvage MD 12/07/2023 12:59 PM EST RP Workstation: [id number]   DG Chest 1 View Result Date: 12/07/2023 EXAM: 1 VIEW(S) XRAY OF THE CHEST 12/07/2023 12:21:00 PM COMPARISON: 11/24/2023 CLINICAL HISTORY: Fall [id number] FINDINGS: LUNGS AND PLEURA: Leftward rotated frontal projection. Trace left pleural effusion. Left retrocardiac basilar airspace opacity could reflect atelectasis or pneumonia, follow up chest radiography recommended to ensure clearance after treatment. No pneumothorax. HEART AND MEDIASTINUM: Atherosclerotic calcifications. BONES AND SOFT TISSUES: No acute osseous abnormality. IMPRESSION: 1. Left retrocardiac basilar airspace opacity, possibly atelectasis or pneumonia, with follow-up chest radiography recommended to ensure clearance after treatment. 2. Trace left pleural effusion. Electronically signed by: Ryan Salvage MD 12/07/2023 12:58 PM EST RP Workstation:  [id number]   CT Head Wo Contrast Result Date: 12/07/2023 CLINICAL DATA:  Mental status change.  Fall. EXAM: CT HEAD WITHOUT CONTRAST CT CERVICAL SPINE WITHOUT CONTRAST TECHNIQUE: Multidetector CT imaging of the head and cervical spine was performed following the standard protocol without intravenous contrast. Multiplanar CT image reconstructions of the cervical spine were also generated. RADIATION DOSE REDUCTION: This exam was performed according to the departmental dose-optimization program which includes automated exposure control, adjustment of the mA and/or kV according to patient size and/or use of iterative reconstruction technique. COMPARISON:  11/24/2023, 11/07/2023 FINDINGS: CT HEAD FINDINGS Brain: There is mild age related atrophic changes the ventricles, cisterns and other CSF spaces are otherwise unchanged. Mild chronic ischemic microvascular disease is present. No mass, mass effect, shift of midline structures or acute hemorrhage. Vascular: No hyperdense vessel or unexpected calcification. Skull: Normal. Negative for fracture or focal lesion. Sinuses/Orbits: Orbits are normal. Paranasal sinuses are well developed and are clear. Mastoid air cells are clear. Other: None. CT CERVICAL SPINE FINDINGS Alignment: No posttraumatic subluxation. Skull base and vertebrae: Mild spondylosis of the cervical spine to include uncovertebral joint spurring and facet arthropathy. Vertebral body heights within the cervical spine are maintained. Minimal left-sided neural foraminal narrowing at the C5-6 level mild bilateral neural foraminal narrowing at the C6-7 level. No acute fracture. Soft tissues and spinal canal: Soft tissues are normal. Borderline canal stenosis at the C6-7 level. Disc levels: Disc space narrowing from the C4-5 level to the C6-7 level. Upper chest: No acute findings. Stable 1.2 cm subsolid nodule over the left apex. Other: None. IMPRESSION: 1. No acute brain injury. 2. Mild age related atrophic  changes and chronic ischemic microvascular disease. 3. No acute cervical spine injury. 4. Mild spondylosis of the cervical spine with disc disease from the C4-5 level to the C6-7 level. Minimal left-sided neural foraminal narrowing at the C5-6 level and mild bilateral neural foraminal narrowing at the C6-7 level. Borderline canal stenosis at the C6-7 level. 5. Stable 1.2 cm subsolid nodule over the left apex. Continues to recommend further evaluation with noncontrast chest CT as previously recommended. Electronically Signed   By: Toribio Agreste M.D.   On: 12/07/2023 12:01   CT Cervical Spine Wo  Contrast Result Date: 12/07/2023 CLINICAL DATA:  Mental status change.  Fall. EXAM: CT HEAD WITHOUT CONTRAST CT CERVICAL SPINE WITHOUT CONTRAST TECHNIQUE: Multidetector CT imaging of the head and cervical spine was performed following the standard protocol without intravenous contrast. Multiplanar CT image reconstructions of the cervical spine were also generated. RADIATION DOSE REDUCTION: This exam was performed according to the departmental dose-optimization program which includes automated exposure control, adjustment of the mA and/or kV according to patient size and/or use of iterative reconstruction technique. COMPARISON:  11/24/2023, 11/07/2023 FINDINGS: CT HEAD FINDINGS Brain: There is mild age related atrophic changes the ventricles, cisterns and other CSF spaces are otherwise unchanged. Mild chronic ischemic microvascular disease is present. No mass, mass effect, shift of midline structures or acute hemorrhage. Vascular: No hyperdense vessel or unexpected calcification. Skull: Normal. Negative for fracture or focal lesion. Sinuses/Orbits: Orbits are normal. Paranasal sinuses are well developed and are clear. Mastoid air cells are clear. Other: None. CT CERVICAL SPINE FINDINGS Alignment: No posttraumatic subluxation. Skull base and vertebrae: Mild spondylosis of the cervical spine to include uncovertebral joint  spurring and facet arthropathy. Vertebral body heights within the cervical spine are maintained. Minimal left-sided neural foraminal narrowing at the C5-6 level mild bilateral neural foraminal narrowing at the C6-7 level. No acute fracture. Soft tissues and spinal canal: Soft tissues are normal. Borderline canal stenosis at the C6-7 level. Disc levels: Disc space narrowing from the C4-5 level to the C6-7 level. Upper chest: No acute findings. Stable 1.2 cm subsolid nodule over the left apex. Other: None. IMPRESSION: 1. No acute brain injury. 2. Mild age related atrophic changes and chronic ischemic microvascular disease. 3. No acute cervical spine injury. 4. Mild spondylosis of the cervical spine with disc disease from the C4-5 level to the C6-7 level. Minimal left-sided neural foraminal narrowing at the C5-6 level and mild bilateral neural foraminal narrowing at the C6-7 level. Borderline canal stenosis at the C6-7 level. 5. Stable 1.2 cm subsolid nodule over the left apex. Continues to recommend further evaluation with noncontrast chest CT as previously recommended. Electronically Signed   By: Toribio Agreste M.D.   On: 12/07/2023 12:01   DG CHEST PORT 1 VIEW Result Date: 11/24/2023 EXAM: 1 VIEW(S) XRAY OF THE CHEST 11/24/2023 08:46:00 PM COMPARISON: 11/07/2023 CLINICAL HISTORY: Confusion FINDINGS: LUNGS AND PLEURA: Left basilar patchy opacity. Trace left pleural effusion. No pneumothorax. HEART AND MEDIASTINUM: Atherosclerotic calcifications. No acute abnormality of the cardiac and mediastinal silhouettes. BONES AND SOFT TISSUES: No acute osseous abnormality. IMPRESSION: 1. Left basilar patchy opacity, which may reflect infection or atelectasis. 2. Trace left pleural effusion. Electronically signed by: Morgane Naveau MD 11/24/2023 08:54 PM EST RP Workstation: [id number]   CT ABDOMEN PELVIS W CONTRAST Result Date: 11/24/2023 EXAM: CT ABDOMEN AND PELVIS WITH CONTRAST 11/24/2023 04:26:51 PM TECHNIQUE: CT of  the abdomen and pelvis was performed with the administration of 80 mL iohexol  (OMNIPAQUE ) 300 MG/ML solution. Multiplanar reformatted images are provided for review. Automated exposure control, iterative reconstruction, and/or weight-based adjustment of the mA/kV was utilized to reduce the radiation dose to as low as reasonably achievable. COMPARISON: None available. CLINICAL HISTORY: Abdominal pain, confusion; fall; inability to walk FINDINGS: LOWER CHEST: Coronary and descending thoracic aortic atherosclerotic vascular calcifications. Calcified granuloma posteriorly in the left lower lobe. Mild dependent atelectasis in both lower lobes. Mild cardiomegaly. LIVER: Stable hypodense liver lesions, likely cysts, warranting no further imaging workup. GALLBLADDER AND BILE DUCTS: Gallbladder is unremarkable. No biliary ductal dilatation. SPLEEN: No acute abnormality. PANCREAS:  No acute abnormality. ADRENAL GLANDS: No acute abnormality. KIDNEYS, URETERS AND BLADDER: 2.7 cm simple right mid kidney cyst warrants no further imaging workup. Prominent bilateral hydronephrosis and moderate bilateral hydroureter extending down to the distended urinary bladder. The urinary bladder measures 17.2 x 10.8 x 11.4 cm, with a calculated volume of 1109 ml. Streak artifact in the patient's left hip implant partially obscures the vicinity of the urethra. The appearance raises concern for possible bladder outlet or urethral obstruction. No urinary tract calculi identified. No perinephric or periureteral stranding. GI AND BOWEL: Stomach demonstrates no acute abnormality. There is no bowel obstruction. No current findings of bowel distress such as pneumatosis, bowel wall thickening, or mesenteric edema. Low position of the anorectal junction suggesting pelvic floor laxity. PERITONEUM AND RETROPERITONEUM: No ascites. No free air. VASCULATURE: Considerable abdominal aortic atheromatous vascular calcification noted. Substantial atheromatous  plaque severely narrowing the proximal superior mesenteric artery; poor visualization of the proximal celiac trunk which is likely occluded, with reconstitution via collaterals from the superior mesenteric artery. This predisposes the patient to mesenteric ischemia including chronic mesenteric ischemia. LYMPH NODES: No lymphadenopathy. REPRODUCTIVE ORGANS: Uterus absent. The possibility of mucosal thickening along the vaginal vestibule is not excluded. BONES AND SOFT TISSUES: Loss of disc height at L4-L5 with lower lumbar spondylosis. 3 mm grade 1 degenerative anterolisthesis at L5-S1. Left total hip prosthesis. No focal soft tissue abnormality. IMPRESSION: 1. Prominent bilateral hydronephrosis and moderate bilateral hydroureter to a markedly distended urinary bladder, concerning for bladder outlet or urethral obstruction; no urinary tract calculi identified. 2. 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 3. Loss of disc height at L4-L5 with lower lumbar spondylosis. 4. Grade 1 degenerative anterolisthesis at L5-S1. Electronically signed by: Ryan Salvage MD 11/24/2023 05:03 PM EST RP Workstation: [id number]   CT Head Wo Contrast Result Date: 11/24/2023 EXAM: CT HEAD WITHOUT CONTRAST 11/24/2023 04:26:51 PM TECHNIQUE: CT of the head was performed without the administration of intravenous contrast. Automated exposure control, iterative reconstruction, and/or weight based adjustment of the mA/kV was utilized to reduce the radiation dose to as low as reasonably achievable. COMPARISON: CT head without contrast 11/07/2023. MR head without contrast 11/07/2023. CLINICAL HISTORY: Delirium FINDINGS: BRAIN AND VENTRICLES: Moderate generalized cerebral and cerebellar volume loss. Moderate periventricular and deep cerebral white matter disease.  Atherosclerotic calcifications in cavernous internal carotid arteries. No acute hemorrhage. No evidence of acute infarct. No hydrocephalus. No extra-axial collection. No mass effect or midline shift. ORBITS: Bilateral lens replacement. SINUSES: Mucosal thickening in bilateral ethmoid sinuses. Trace left mastoid effusion. SOFT TISSUES AND SKULL: No acute soft tissue abnormality. No skull fracture. IMPRESSION: 1. No acute intracranial abnormality. 2. Stable moderate  generalized cerebral and cerebellar volume loss. 3. Stable moderate periventricular and deep cerebral white matter disease. Electronically signed by: Lonni Necessary MD 11/24/2023 04:44 PM EST RP Workstation: [id number]   CT Cervical Spine Wo Contrast Result Date: 11/24/2023 EXAM: CT CERVICAL SPINE WITH CONTRAST 11/24/2023 04:26:51 PM TECHNIQUE: CT of the cervical spine was performed with the administration of 80 mL of iohexol  (OMNIPAQUE ) 300 MG/ML solution. Multiplanar reformatted images are provided for review. Automated exposure control, iterative reconstruction, and/or weight based adjustment of the mA/kV was utilized to reduce the radiation dose to as low as reasonably achievable. COMPARISON: CT cervical spine 11/30/2023. CLINICAL HISTORY: Neck trauma (Age >= 65y); reports leg pain. Fall. Increased confusion. FINDINGS: CERVICAL SPINE: BONES AND ALIGNMENT: No  acute fracture or traumatic malalignment. Ankylosis of the cervical spine is present at C2-C3 and C4-C5. Slight degenerative anterolisthesis present at C4-C5. Straightening of the normal cervical lordosis is present. DEGENERATIVE CHANGES: Ankylosis of the cervical spine is present at C2-C3 and C4-C5. Slight degenerative anterolisthesis present at C4-C5. Straightening of the normal cervical lordosis is present. SOFT TISSUES: No prevertebral soft tissue swelling. Atherosclerotic calcifications are present at the carotid bifurcations bilaterally without significant stenosis. IMPRESSION: 1.  No acute abnormality of the cervical spine related to the reported neck trauma. 2. Ankylosis of the cervical spine at C2-3 and C4-5 with slight degenerative anterolisthesis at C4-5. 3. Straightening of the normal cervical lordosis. Electronically signed by: Lonni Necessary MD 11/24/2023 04:42 PM EST RP Workstation: [id number]   VAS US  LOWER EXTREMITY VENOUS (DVT) (ONLY [facility] & [facility]) Result Date: 11/24/2023  Lower Venous DVT Study Patient Name:  Christine [name] Bloyd  Date of Exam:   11/24/2023 Medical Rec #: [id number]        Accession #:    [id number] Date of Birth: Aug 15, 1933        Patient Gender: F Patient Age:   12 years Exam Location:  [facility] Procedure:      VAS US  LOWER EXTREMITY VENOUS (DVT) Referring Phys: ROCKY MASSY --------------------------------------------------------------------------------  Indications: Pain.  Risk Factors: None identified. Limitations: Poor ultrasound/tissue interface and patient positioning, patient immobility, patient pain tolerance. Comparison Study: No prior studies. Performing Technologist: Cordella Collet RVT  Examination Guidelines: A complete evaluation includes B-mode imaging, spectral Doppler, color Doppler, and power Doppler as needed of all accessible portions of each vessel. Bilateral testing is considered an integral part of a complete examination. Limited examinations for reoccurring indications may be performed as noted. The reflux portion of the exam is performed with the patient in reverse Trendelenburg.  +---------+---------------+---------+-----------+----------+--------------+ RIGHT    CompressibilityPhasicitySpontaneityPropertiesThrombus Aging +---------+---------------+---------+-----------+----------+--------------+ CFV      Full           Yes      Yes                                 +---------+---------------+---------+-----------+----------+--------------+ SFJ      Full                                                         +---------+---------------+---------+-----------+----------+--------------+ FV Prox  Full                                                        +---------+---------------+---------+-----------+----------+--------------+ FV Mid   Full                                                        +---------+---------------+---------+-----------+----------+--------------+ FV Distal               Yes      Yes                                 +---------+---------------+---------+-----------+----------+--------------+  PFV      Full                                                        +---------+---------------+---------+-----------+----------+--------------+ POP      Full           Yes      Yes                                 +---------+---------------+---------+-----------+----------+--------------+ PTV      Full                                                        +---------+---------------+---------+-----------+----------+--------------+ PERO     Full                                                        +---------+---------------+---------+-----------+----------+--------------+   +---------+---------------+---------+-----------+----------+-------------------+ LEFT     CompressibilityPhasicitySpontaneityPropertiesThrombus Aging      +---------+---------------+---------+-----------+----------+-------------------+ CFV      Full           Yes      Yes                                      +---------+---------------+---------+-----------+----------+-------------------+ SFJ      Full                                                             +---------+---------------+---------+-----------+----------+-------------------+ FV Prox  Full                                                             +---------+---------------+---------+-----------+----------+-------------------+ FV Mid   Full                                                              +---------+---------------+---------+-----------+----------+-------------------+ FV Distal               Yes      Yes                                      +---------+---------------+---------+-----------+----------+-------------------+ PFV      Full                                                             +---------+---------------+---------+-----------+----------+-------------------+  POP      Full           Yes      Yes                                      +---------+---------------+---------+-----------+----------+-------------------+ PTV      Full                                                             +---------+---------------+---------+-----------+----------+-------------------+ PERO                                                  Not well visualized +---------+---------------+---------+-----------+----------+-------------------+     Summary: RIGHT: - There is no evidence of deep vein thrombosis in the lower extremity. However, portions of this examination were limited- see technologist comments above.  - No cystic structure found in the popliteal fossa.  LEFT: - There is no evidence of deep vein thrombosis in the lower extremity. However, portions of this examination were limited- see technologist comments above.  - No cystic structure found in the popliteal fossa.  *See table(s) above for measurements and observations. Electronically signed by Lonni Gaskins MD on 11/24/2023 at 4:29:06 PM.    Final     There are no new results to review at this time.  Previous records (including but not limited to H&P, progress notes, nursing notes, TOC management) were reviewed in assessment of this patient.  Labs: CBC: Recent Labs  Lab 12/09/23 0445 12/10/23 0546 12/11/23 0604  WBC 4.2 4.1 3.4*  HGB 8.7* 8.5* 9.0*  HCT 26.8* 26.4* 27.8*  MCV 87.6 86.8 87.1  PLT 236 233 226   Basic Metabolic Panel: Recent Labs  Lab 12/09/23 0445 12/10/23 0546  12/11/23 0604  NA 141 141 139  K 3.9 3.7 4.0  CL 109 106 109  CO2 23 24 23   GLUCOSE 95 101* 92  BUN 21 17 16   CREATININE 0.70 0.76 0.69  CALCIUM  8.1* 8.3* 8.1*   Liver Function Tests: No results for input(s): AST, ALT, ALKPHOS, BILITOT, PROT, ALBUMIN in the last 168 hours. CBG: No results for input(s): GLUCAP in the last 168 hours.  Scheduled Meds:  acetaminophen   500 mg Oral Q6H   Chlorhexidine  Gluconate Cloth  6 each Topical Daily   collagenase   1 Application Topical Daily   diltiazem   120 mg Oral Daily   feeding supplement  237 mL Oral BID BM   liver oil-zinc  oxide   Topical BID   memantine   5 mg Oral Daily   pantoprazole   40 mg Oral Daily   potassium chloride   10 mEq Oral Once   QUEtiapine   25 mg Oral QHS   Rivaroxaban   15 mg Oral QPM   sodium chloride  flush  3 mL Intravenous Q12H   Continuous Infusions: PRN Meds:.bisacodyl , divalproex , LORazepam , polyethylene glycol, traMADol , trimethobenzamide   Family Communication: Not at bedside  Disposition: Status is: Observation The patient remains OBS appropriate and will d/c before 2 midnights.     Time spent: 30 minutes  Length of inpatient stay: 0 days  Author: Carliss LELON Canales, DO 12/15/2023 11:29 AM  For on call review [email or web address].

## 2023-12-15 NOTE — Care Management Obs Status (Addendum)
 [place] MEDICARE OBSERVATION STATUS NOTIFICATION   Patient Details  Name: Christine [name] MRN: [id number] Date of Birth: [birth year]   Medicare Observation Status Notification Given:       Obs notice given telephonically with patient daughter will leave a copy in the patients room   Jerimey Burridge 12/15/2023, 10:34 AM

## 2023-12-15 NOTE — Hospital Course (Signed)
 89 y.o. adult with medical history significant for dementia with behavioral disturbance, coronary artery disease, atrial fibrillation on Xarelto , recurrent UTIs, recurrent falls, and recent admission with urinary retention and bilateral hydronephrosis who now returns with inability to ambulate and increased confusion after another fall.    Assessment and Plan:   Acute metabolic encephalopathy with underlying dementia - Initially presenting with worsening confusion.  Currently appears to be back at baseline.  Likely multifactorial etiology given underlying dementia, urinary retention.  Discontinuing sitter.  Continue Namenda , Seroquel , Depakote .   Acute urinary retention with bilateral hydronephrosis - Noted on CT scan.  Foley catheter placed.  Urine output appears excellent.  Foley will remain in place.  Flomax  initiated today.  Will follow-up with urology in the outpatient setting.   Prolonged QT interval - According QT prolonging medications.   Paroxysmal atrial fibrillation - Xarelto  and diltiazem  on board.   Physical debilitation muscle weakness - SNF recommended.  Will DC sitter as will need to be off sitter observation for 2 days.

## 2023-12-15 NOTE — Plan of Care (Signed)
°  Problem: Health Behavior/Discharge Planning: Goal: Ability to manage health-related needs will improve Outcome: Progressing   Problem: Clinical Measurements: Goal: Will remain free from infection Outcome: Progressing Goal: Diagnostic test results will improve Outcome: Progressing Goal: Respiratory complications will improve Outcome: Progressing   Problem: Nutrition: Goal: Adequate nutrition will be maintained Outcome: Progressing   Problem: Coping: Goal: Level of anxiety will decrease Outcome: Progressing   Problem: Elimination: Goal: Will not experience complications related to bowel motility Outcome: Progressing   Problem: Safety: Goal: Ability to remain free from injury will improve Outcome: Progressing   Problem: Skin Integrity: Goal: Risk for impaired skin integrity will decrease Outcome: Progressing

## 2023-12-15 NOTE — Care Management Obs Status (Signed)
 MEDICARE OBSERVATION STATUS NOTIFICATION   Patient Details  Name: Christine [name] MRN: [id number] Date of Birth: 1933/01/10   Medicare Observation Status Notification Given:  Yes  Obs notice given telephonically to patients daughter will leave a copy in the patients room   [name] 12/15/2023, 10:39 AM

## 2023-12-16 DIAGNOSIS — I480 Paroxysmal atrial fibrillation: Secondary | ICD-10-CM | POA: Diagnosis not present

## 2023-12-16 DIAGNOSIS — R9431 Abnormal electrocardiogram [ECG] [EKG]: Secondary | ICD-10-CM | POA: Diagnosis not present

## 2023-12-16 DIAGNOSIS — R911 Solitary pulmonary nodule: Secondary | ICD-10-CM | POA: Diagnosis not present

## 2023-12-16 DIAGNOSIS — I10 Essential (primary) hypertension: Secondary | ICD-10-CM | POA: Diagnosis not present

## 2023-12-16 DIAGNOSIS — R338 Other retention of urine: Secondary | ICD-10-CM | POA: Diagnosis not present

## 2023-12-16 DIAGNOSIS — G9340 Encephalopathy, unspecified: Secondary | ICD-10-CM | POA: Diagnosis not present

## 2023-12-16 DIAGNOSIS — F0390 Unspecified dementia without behavioral disturbance: Secondary | ICD-10-CM | POA: Diagnosis not present

## 2023-12-16 LAB — CBC
HCT: 31.4 % — ABNORMAL LOW (ref 36.0–46.0)
Hemoglobin: 10.3 g/dL — ABNORMAL LOW (ref 12.0–15.0)
MCH: 28.4 pg (ref 26.0–34.0)
MCHC: 32.8 g/dL (ref 30.0–36.0)
MCV: 86.5 fL (ref 80.0–100.0)
Platelets: 280 K/uL (ref 150–400)
RBC: 3.63 MIL/uL — ABNORMAL LOW (ref 3.87–5.11)
RDW: 15.6 % — ABNORMAL HIGH (ref 11.5–15.5)
WBC: 4.4 K/uL (ref 4.0–10.5)
nRBC: 0 % (ref 0.0–0.2)

## 2023-12-16 LAB — BASIC METABOLIC PANEL WITH GFR
Anion gap: 9 (ref 5–15)
BUN: 19 mg/dL (ref 8–23)
CO2: 23 mmol/L (ref 22–32)
Calcium: 8.8 mg/dL — ABNORMAL LOW (ref 8.9–10.3)
Chloride: 105 mmol/L (ref 98–111)
Creatinine, Ser: 0.81 mg/dL (ref 0.44–1.00)
GFR, Estimated: >60 mL/min (ref >60)
Glucose, Bld: 96 mg/dL (ref 70–99)
Potassium: 3.5 mmol/L (ref 3.5–5.1)
Sodium: 137 mmol/L (ref 135–145)

## 2023-12-16 NOTE — Progress Notes (Signed)
 Physical Therapy Treatment Patient Details Name: Christine [name] MRN: [id number] DOB: April 13, 1933 Today's Date: 12/16/2023   History of Present Illness 89 y.o. female admitted from ALF on 12/07/23 with acute encephalopathy, dehydration, increased falls. Of note, 2x recent admissions 11/2023 with similar concerns, bilateral hydronephrosis and d/c with foley. PMH includes dementia, CAD, afib, TIA, recurrent UTIs.    PT Comments  Pt received in supine and agreeable to session. Pt requires increased cues for initiation and command following. Pt able to perform 3 standing trials with min-mod A due to posterior bias. Pt also able to take lateral steps towards HOB. Pt requires less assist returning to supine due to improved initiation. Noted insurance declined SNF and family now plans for pt to return to memory care facility with HHPT, so discharge recommendations updated after discussion with supervising PT Kate ORN. Pt continues to benefit from PT services to progress toward functional mobility goals.    If plan is discharge home, recommend the following: A lot of help with walking and/or transfers;A lot of help with bathing/dressing/bathroom   Can travel by private vehicle     No  Equipment Recommendations  Wheelchair (measurements PT);Hospital bed    Recommendations for Other Services       Precautions / Restrictions Precautions Precautions: Fall Recall of Precautions/Restrictions: Impaired Restrictions Weight Bearing Restrictions Per Provider Order: No     Mobility  Bed Mobility Overal bed mobility: Needs Assistance Bed Mobility: Supine to Sit, Sit to Supine     Supine to sit: Min assist, HOB elevated, Used rails Sit to supine: Min assist   General bed mobility comments: Cues and assist for BLE advancement to EOB and for trunk elevation. Assist for RLE elevation back to EOB and supine scoot to EOB    Transfers Overall transfer level: Needs assistance Equipment used: Rolling  walker (2 wheels) Transfers: Sit to/from Stand Sit to Stand: Min assist, Mod assist           General transfer comment: STS from EOB x3 with mod A progressing to min A with improved power up. Posterior bias despite multimodal cues for anterior lean. Pt able to lateral step towards [facility] with assist for RW management and increased cues for initiation    Ambulation/Gait               General Gait Details: unable to progress due to posterior lean and no +2 for chair follow   Stairs             Wheelchair Mobility     Tilt Bed    Modified Rankin (Stroke Patients Only)       Balance Overall balance assessment: Needs assistance Sitting-balance support: No upper extremity supported, Feet supported Sitting balance-Leahy Scale: Good Sitting balance - Comments: sitting EOB   Standing balance support: Bilateral upper extremity supported, Reliant on assistive device for balance, During functional activity Standing balance-Leahy Scale: Poor Standing balance comment: posterior lean despite multimodal cues. Min-mod A to prevent posterior LOB                            Communication Communication Communication: No apparent difficulties Factors Affecting Communication: Reduced clarity of speech;Difficulty expressing self  Cognition Arousal: Alert Behavior During Therapy: Flat affect   PT - Cognitive impairments: History of cognitive impairments                       PT - Cognition Comments:  Increased cues to follow command and for initiation Following commands: Impaired Following commands impaired: Follows one step commands with increased time, Follows multi-step commands inconsistently    Cueing Cueing Techniques: Verbal cues, Tactile cues, Visual cues  Exercises Other Exercises Other Exercises: BLE static standing marches x5    General Comments General comments (skin integrity, edema, etc.): HR up to 117 with mobility      Pertinent  Vitals/Pain Pain Assessment Pain Assessment: No/denies pain     PT Goals (current goals can now be found in the care plan section) Acute Rehab PT Goals Patient Stated Goal: regain strength PT Goal Formulation: With family Time For Goal Achievement: 12/22/23 Progress towards PT goals: Progressing toward goals    Frequency    Min 1X/week       AM-PAC PT 6 Clicks Mobility   Outcome Measure  Help needed turning from your back to your side while in a flat bed without using bedrails?: A Little Help needed moving from lying on your back to sitting on the side of a flat bed without using bedrails?: A Little Help needed moving to and from a bed to a chair (including a wheelchair)?: A Little Help needed standing up from a chair using your arms (e.g., wheelchair or bedside chair)?: A Lot Help needed to walk in hospital room?: A Lot Help needed climbing 3-5 steps with a railing? : Total 6 Click Score: 14    End of Session Equipment Utilized During Treatment: Gait belt Activity Tolerance: Patient tolerated treatment well Patient left: with call bell/phone within reach;in bed;with bed alarm set Nurse Communication: Mobility status PT Visit Diagnosis: History of falling (Z91.81);Muscle weakness (generalized) (M62.81);Other abnormalities of gait and mobility (R26.89) Pain - Right/Left: Right Pain - part of body: Arm;Leg     Time: 1425-1445 PT Time Calculation (min) (ACUTE ONLY): 20 min  Charges:    $Therapeutic Activity: 8-22 mins PT General Charges $$ ACUTE PT VISIT: 1 Visit                    Darryle George, PTA Acute Rehabilitation Services Secure Chat Preferred  [phone]    Darryle George 12/16/2023, 3:30 PM

## 2023-12-16 NOTE — Progress Notes (Addendum)
 Progress Note   Patient: Christine [name] FMW:[id number] DOB: November 23, 1933 DOA: 12/07/2023     0 DOS: the patient was seen and examined on 12/16/2023   Brief hospital course: 89 y.o. adult with medical history significant for dementia with behavioral disturbance, coronary artery disease, atrial fibrillation on Xarelto , recurrent UTIs, recurrent falls, and recent admission with urinary retention and bilateral hydronephrosis who now returns with inability to ambulate and increased confusion after another fall.  Patient was recent hospitalized, found to have urinary retention with bilateral hydronephrosis. She had Foley catheter placed, failed a voiding trial, and was discharged with Foley. PT had recommended that she be discharged to an SNF but she returned to her ALF at the daughter's request. She fell again shortly after discharge, PT recommending SNF-R  Assessment and Plan: Acute metabolic encephalopathy with underlying dementia Presented with with worsening confusion.  Currently appears to be back at baseline.  Likely multifactorial etiology given underlying dementia, urinary retention. Off sitter now.  Continue Namenda , Seroquel , Depakote . Delirium precautions.   Acute urinary retention with bilateral hydronephrosis Noted on CT scan.  continue Flomax  initiated today. Will do voiding trial. If fails will replace foley. Need follow-up with urology in the outpatient setting.   Prolonged QT interval Avoid QT prolonging medications.   Paroxysmal atrial fibrillation Continue Xarelto  and diltiazem .   Physical debilitation muscle weakness SNF declined. Discussed with daughter, she wishes her to return to memory care unit with hospice and rehab therapy. TOC working on [place].  [facility] is going to be the hospice provider.      Out of bed to chair. Incentive spirometry. Nursing supportive care. Fall, aspiration precautions. Diet:  Diet Orders (From admission, onward)      Start     Ordered   12/07/23 2143  Diet regular Room service appropriate? Yes; Fluid consistency: Thin  Diet effective now       Question Answer Comment  Room service appropriate? Yes   Fluid consistency: Thin      12/07/23 2149           DVT prophylaxis:  Rivaroxaban  (XARELTO ) tablet 15 mg  Level of care: Telemetry   Code Status: Limited: Do not attempt resuscitation (DNR) -DNR-LIMITED -Do Not Intubate/DNI   Subjective: Patient is seen and examined today morning. She is sitting in chair able to answer me slowly, daughter at bedside asked about voiding trial and return to memory unit.  Physical Exam: Vitals:   12/15/23 1953 12/16/23 0354 12/16/23 0751 12/16/23 1153  BP: (!) 119/52 132/60 128/72 (!) 121/54  Pulse: 68 88 88 84  Resp: 18 18 18 18   Temp: 98.4 F (36.9 C) 98.1 F (36.7 C) 97.8 F (36.6 C) (!) 97.5 F (36.4 C)  TempSrc: Oral Oral Oral Oral  SpO2: 98% 99% 100% 97%  Weight:      Height:        General - Elderly Caucasian ill female, no apparent distress HEENT - PERRLA, EOMI, atraumatic head, non tender sinuses. Lung - Clear, no rales, rhonchi, wheezes. Heart - S1, S2 heard, no murmurs, rubs, no pedal edema. Abdomen - Soft, non tender, bowel sounds good, foley intact Neuro - Alert, awake and follows simple commands, non focal exam. Skin - Warm and dry.  Data Reviewed:      Latest Ref Rng & Units 12/16/2023    5:16 AM 12/11/2023    6:04 AM 12/10/2023    5:46 AM  CBC  WBC 4.0 - 10.5 K/uL 4.4  3.4  4.1   Hemoglobin 12.0 - 15.0 g/dL 89.6  9.0  8.5   Hematocrit 36.0 - 46.0 % 31.4  27.8  26.4   Platelets 150 - 400 K/uL 280  226  233       Latest Ref Rng & Units 12/16/2023    5:16 AM 12/11/2023    6:04 AM 12/10/2023    5:46 AM  BMP  Glucose 70 - 99 mg/dL 96  92  898   BUN 8 - 23 mg/dL 19  16  17    Creatinine 0.44 - 1.00 mg/dL 9.18  9.30  9.23   Sodium 135 - 145 mmol/L 137  139  141   Potassium 3.5 - 5.1 mmol/L 3.5  4.0  3.7   Chloride 98 - 111 mmol/L  105  109  106   CO2 22 - 32 mmol/L 23  23  24    Calcium  8.9 - 10.3 mg/dL 8.8  8.1  8.3    No results found.  Family Communication: Discussed with patient's daughter, understand and agree. All questions answered.  Disposition: Status is: Observation The patient remains OBS appropriate and will d/c before 2 midnights.  Planned Discharge Destination: memory care unit     Time spent: 44 minutes  Author: Concepcion Riser, MD 12/16/2023 3:23 PM Secure chat 7am to 7pm For on call review [email or web address].

## 2023-12-16 NOTE — [place] FL2 (Signed)
 [place]  MEDICAID FL2 LEVEL OF CARE FORM     IDENTIFICATION  Patient Name: Christine [name] Birthdate: Oct 28, 1933 Sex: adult Admission Date (Current Location): 12/07/2023  [facility] and [place] Number:  Producer, Television/film/video and Address:  The . [facility], [address] [place], [place] [address]      Provider Number: [id number]  Attending Physician Name and Address:  Darci Pore, MD  Relative Name and Phone Number:  [name] Clause (daughter) Ph: [phone]    Current Level of Care: Hospital Recommended Level of Care: Memory Care Prior Approval Number:    Date Approved/Denied:   PASRR Number: [id number]  Discharge Plan: Other (Comment) (Memory care unit)    Current Diagnoses: Patient Active Problem List   Diagnosis Date Noted   Nodule of apex of left lung 12/07/2023   Prolonged QT interval 12/07/2023   Acute urinary retention 11/24/2023   Protein-calorie malnutrition, severe 11/08/2023   Syncope 01/30/2022   Syncope and collapse 01/30/2022   Malnutrition of moderate degree 03/15/2021   Vitamin B12 deficiency 03/14/2021   Dyslipidemia 03/14/2021   Recurrent falls 03/14/2021   NSTEMI (non-ST elevated myocardial infarction) (HCC) 03/13/2021   History of kidney stones 10/23/2020   Dementia without behavioral disturbance (HCC) 08/30/2020   Pain in right knee 08/30/2020   Vitamin D  deficiency 08/30/2020   Bilateral impacted cerumen 08/22/2019   Thrombophilia 03/02/2019   Increased frequency of urination 10/14/2017   Impacted cerumen 08/04/2016   Age-related osteoporosis without current pathological fracture 07/06/2016   Hip fracture (HCC) 05/29/2016   Closed left hip fracture (HCC) 05/27/2016   Insomnia 12/17/2015   Memory loss 12/17/2015   Dysuria 12/04/2015   AKI (acute kidney injury) 10/01/2015   Acute encephalopathy 10/01/2015   Interstitial cystitis 10/01/2015   Essential hypertension 10/01/2015   Rhabdomyolysis 09/30/2015    Major depression, single episode 07/10/2015   Non-thrombocytopenic purpura 07/10/2015   Encounter for general adult medical examination without abnormal findings 11/30/2014   Fall 08/07/2013   Pain in left leg 08/07/2013   Lower urinary tract infectious disease 07/16/2013   Paroxysmal atrial fibrillation (HCC) 07/16/2013   Chest pain 07/16/2013   Transient ischemic attack 07/04/2012   Chronic pain syndrome 03/28/2012   Residual hemorrhoidal skin tags 01/18/2012   Abdominal pain 02/03/2010   Pain in limb 10/07/2009   Anemia 03/26/2009   Allergic rhinitis 02/27/2009   Anxiety disorder 02/27/2009   Hyperlipidemia 02/27/2009   Low compliance bladder 02/27/2009   DYSPEPSIA 01/18/2008   ADENOMATOUS COLONIC POLYP 01/17/2008   HEMORRHOIDS, INTERNAL 01/17/2008   ESOPHAGITIS 01/17/2008   DIVERTICULOSIS, COLON 01/17/2008   HEMORRHOIDS-EXTERNAL 12/07/2007   GERD 12/07/2007   Constipation 12/07/2007   RECTAL BLEEDING 12/07/2007   Loss of weight 12/07/2007   Nausea alone 12/07/2007   HEARTBURN 12/07/2007   ABDOMINAL BLOATING 12/07/2007   History of colonic polyps 12/07/2007    Orientation RESPIRATION BLADDER Height & Weight     Self, Situation  Normal Incontinent Weight: 54.6 kg Height:  5' 4 (162.6 cm)  BEHAVIORAL SYMPTOMS/MOOD NEUROLOGICAL BOWEL NUTRITION STATUS      Incontinent Diet  AMBULATORY STATUS COMMUNICATION OF NEEDS Skin   Extensive Assist Verbally Other (Comment) (Erythema: buttocks, sacrum; Ecchymosis: buttocks)                       Personal Care Assistance Level of Assistance  Bathing, Feeding, Dressing Bathing Assistance: Maximum assistance Feeding assistance: Limited assistance Dressing Assistance: Limited assistance     Functional  Limitations Info    Sight Info: Impaired        SPECIAL CARE FACTORS FREQUENCY  PT (By licensed PT), OT (By licensed OT)     PT Frequency: 5x/week OT Frequency: 5x/week            Contractures Contractures Info:  Not present    Additional Factors Info  Code Status, Allergies Code Status Info: DNR Allergies Info: Ambien  (Zolpidem  Tartrate); Celexa (Citalopram); Cymbalta (Duloxetine Hcl); Ditropan Xl (Oxybutynin Chloride); Lescol (Fluvastatin); Lipitor (Pregabalin ); Neurontin (Gabapentin); Pravastatin; Tape; Zetia (Ezetimibe); Zocor (Simvastatin)           Current Medications (12/16/2023):  This is the current hospital active medication list Current Facility-Administered Medications  Medication Dose Route Frequency Provider Last Rate Last Admin   acetaminophen  (TYLENOL ) tablet 500 mg  500 mg Oral Q6H Opyd, Timothy S, MD   500 mg at 12/16/23 0941   bisacodyl  (DULCOLAX) EC tablet 5 mg  5 mg Oral Daily PRN Opyd, Timothy S, MD       Chlorhexidine  Gluconate Cloth 2 % PADS 6 each  6 each Topical Daily Dibia, Pauline E, MD   6 each at 12/16/23 1000   collagenase  (SANTYL ) ointment 1 Application  1 Application Topical Daily Opyd, Timothy S, MD   1 Application at 12/16/23 1000   diltiazem  (CARDIZEM  CD) 24 hr capsule 120 mg  120 mg Oral Daily Opyd, Timothy S, MD   120 mg at 12/16/23 9057   divalproex  (DEPAKOTE  SPRINKLE) capsule 125 mg  125 mg Oral BID PRN Opyd, Timothy S, MD       feeding supplement (ENSURE PLUS HIGH PROTEIN) liquid 237 mL  237 mL Oral BID BM Mdala-Gausi, Masiku Agatha, MD   237 mL at 12/16/23 1124   liver oil-zinc  oxide (DESITIN) 40 % ointment   Topical BID Opyd, Timothy S, MD   Given at 12/16/23 1000   LORazepam  (ATIVAN ) tablet 0.5-1 mg  0.5-1 mg Oral BID PRN Opyd, Timothy S, MD   1 mg at 12/15/23 1212   memantine  (NAMENDA ) tablet 5 mg  5 mg Oral Daily Opyd, Timothy S, MD   5 mg at 12/16/23 9057   pantoprazole  (PROTONIX ) EC tablet 40 mg  40 mg Oral Daily Opyd, Timothy S, MD   40 mg at 12/16/23 0942   polyethylene glycol (MIRALAX  / GLYCOLAX ) packet 17 g  17 g Oral Daily PRN Opyd, Timothy S, MD       potassium chloride  (KLOR-CON  M) CR tablet 10 mEq  10 mEq Oral Once Opyd, Timothy S, MD        QUEtiapine  (SEROQUEL ) tablet 25 mg  25 mg Oral QHS Opyd, Timothy S, MD   25 mg at 12/15/23 2125   Rivaroxaban  (XARELTO ) tablet 15 mg  15 mg Oral QPM Opyd, Timothy S, MD   15 mg at 12/15/23 1706   sodium chloride  flush (NS) 0.9 % injection 3 mL  3 mL Intravenous Q12H Opyd, Timothy S, MD   3 mL at 12/15/23 2127   tamsulosin  (FLOMAX ) capsule 0.4 mg  0.4 mg Oral Daily Arlon Honey W, DO   0.4 mg at 12/16/23 9057   traMADol  (ULTRAM ) tablet 50 mg  50 mg Oral Q8H PRN Opyd, Timothy S, MD   50 mg at 12/15/23 1029   trimethobenzamide  (TIGAN ) injection 200 mg  200 mg Intramuscular Q6H PRN Opyd, Timothy S, MD         Discharge Medications: Please see discharge summary for a list of discharge medications.  Relevant Imaging Results:  Relevant Lab Results:   Additional Information SSN: [id number]  Rosaline JONELLE Joe, RN

## 2023-12-16 NOTE — Plan of Care (Signed)

## 2023-12-16 NOTE — TOC Progression Note (Addendum)
 Transition of Care [facility]) - Progression Note    Patient Details  Name: Christine [name] MRN: [id number] Date of Birth: Aug 16, 1933  Transition of Care [facility]) CM/SW Contact  Sherline Clack, [place] Phone Number: 12/16/2023, 2:38 PM  Clinical Narrative:     Update 3:40 PM: patient's ambulance transport was approved, auth ID: [id number]. Family does not want to appeal at this time and prefers to return to [facility] due to improved mobility. [facility] made aware of patient's will to return. Anticipated discharge back to [facility] tomorrow 12/12. CSW will continue to follow and update DC plan.   CSW received phone call from [employer] informing CSW insurance auth had been denied. CSW provided fax number to have denial letter sent. Will present denial letter to family and talk about next steps. CSW will continue to follow.   Expected Discharge Plan: Skilled Nursing Facility Barriers to Discharge: Insurance Authorization, SNF Pending bed offer               Expected Discharge Plan and Services                                               Social Drivers of Health (SDOH) Interventions SDOH Screenings   Food Insecurity: No Food Insecurity (12/08/2023)  Housing: Unknown (12/08/2023)  Transportation Needs: Unmet Transportation Needs (12/08/2023)  Utilities: Not At Risk (12/08/2023)  Social Connections: Unknown (12/08/2023)  Tobacco Use: Low Risk (12/07/2023)    Readmission Risk Interventions    11/26/2023    2:47 PM  Readmission Risk Prevention Plan  Transportation Screening Complete  HRI or Home Care Consult Complete  Social Work Consult for Recovery Care Planning/Counseling Complete  Palliative Care Screening Not Applicable  Medication Review Oceanographer) Complete

## 2023-12-17 DIAGNOSIS — G9340 Encephalopathy, unspecified: Secondary | ICD-10-CM | POA: Diagnosis not present

## 2023-12-17 MED ORDER — ENSURE PLUS HIGH PROTEIN PO LIQD: 237.0000 mL | Freq: Two times a day (BID) | ORAL | Status: AC

## 2023-12-17 MED ORDER — TAMSULOSIN HCL 0.4 MG PO CAPS: 0.4000 mg | ORAL_CAPSULE | Freq: Every day | ORAL | Status: AC

## 2023-12-17 MED ORDER — LORAZEPAM 0.5 MG PO TABS: 0.5000 mg | ORAL_TABLET | Freq: Two times a day (BID) | ORAL | Status: AC | PRN

## 2023-12-17 NOTE — Evaluation (Signed)
 Occupational Therapy Evaluation Patient Details Name: Christine [name] MRN: [id number] DOB: Apr 25, 1933 Today's Date: 12/17/2023   History of Present Illness   89 y.o. female admitted from ALF on 12/07/23 with acute encephalopathy, dehydration, increased falls. Of note, 2x recent admissions 11/2023 with similar concerns, bilateral hydronephrosis and d/c with foley. PMH includes dementia, CAD, afib, TIA, recurrent UTIs.     Clinical Impressions At baseline, pt receives assistance for ADLs, IADLs, and functional mobility and with hx of dementia. However, pt friend report, pt currently below baseline cognition and functional level. Pt currently requiring Set up to Total assist with ADLs and Min assist with bed mobility with cues for sequencing and safety. Pt participated well in session, but was limited by signs of anxiety, lability, and current cognitive level. VSS on RA. Pt will benefit from acute skilled OT services to address deficits and increase safety and independence with functional tasks. Post acute discharge, pt will benefit from return to Memory Care with skilled OT services to maximize rehab potential.      If plan is discharge home, recommend the following:   Two people to help with walking and/or transfers;A lot of help with bathing/dressing/bathroom;Assistance with cooking/housework;Assistance with feeding;Direct supervision/assist for medications management;Direct supervision/assist for financial management;Assist for transportation;Supervision due to cognitive status;Help with stairs or ramp for entrance     Functional Status Assessment   Patient has had a recent decline in their functional status and demonstrates the ability to make significant improvements in function in a reasonable and predictable amount of time.     Equipment Recommendations   Other (comment) (defer to next level of care)     Recommendations for Other Services          Precautions/Restrictions   Precautions Precautions: Fall Recall of Precautions/Restrictions: Impaired Restrictions Weight Bearing Restrictions Per Provider Order: No     Mobility Bed Mobility Overal bed mobility: Needs Assistance Bed Mobility: Rolling Rolling: Min assist         General bed mobility comments: requires step-by-step cues    Transfers Overall transfer level: Needs assistance                 General transfer comment: deferred this session due to pt with increased signs of anxiety and lability with bed mobility      Balance                                           ADL either performed or assessed with clinical judgement   ADL Overall ADL's : Needs assistance/impaired Eating/Feeding: Supervision/ safety;Set up;Minimal assistance   Grooming: Moderate assistance;Maximal assistance;Bed level;Cueing for sequencing   Upper Body Bathing: Maximal assistance;Bed level;Cueing for sequencing   Lower Body Bathing: Total assistance;Bed level   Upper Body Dressing : Maximal assistance;Bed level;Cueing for sequencing   Lower Body Dressing: Total assistance;Bed level     Toilet Transfer Details (indicate cue type and reason): deferred this session due to pt with increased signs of anxiety and lability with bed mobility Toileting- Clothing Manipulation and Hygiene: Total assistance               Vision Baseline Vision/History: 6 Macular Degeneration Patient Visual Report: Other (comment) (Pt unable to report) Additional Comments: Decreased vision at baseline; vision largely [facility] for tasks assessed this session; not formally screened or evaluated     Perception  Praxis         Pertinent Vitals/Pain Pain Assessment Pain Assessment: Faces Faces Pain Scale: Hurts little more Pain Location: sacral area Pain Descriptors / Indicators: Discomfort, Grimacing, Guarding, Restless Pain Intervention(s): Limited activity  within patient's tolerance, Monitored during session, Repositioned     Extremity/Trunk Assessment Upper Extremity Assessment Upper Extremity Assessment: Right hand dominant;Generalized weakness;Difficult to assess due to impaired cognition (Pt inconsitently following 1-step commands to assess AROM and tearing up when AAROM of UE was attempted. AROM appears largely [facility] through clinical observation. Pt able to follow instruction to squeeze OT's hands and to reach hands above head.)   Lower Extremity Assessment Lower Extremity Assessment: Defer to PT evaluation   Cervical / Trunk Assessment Cervical / Trunk Assessment: Kyphotic Cervical / Trunk Exceptions: Forward head posture with rounded shoulders   Communication Communication Communication: Impaired Factors Affecting Communication: Difficulty expressing self (Requires increased time for word finding and processing)   Cognition Arousal: Alert Behavior During Therapy: Anxious, Restless, Lability (Pt anxious, restless and tearing up several times during session. Pt able to be comforted by presence of her friend and reassurance that she would be going home soon.) Cognition: History of cognitive impairments             OT - Cognition Comments: baseline dementia; per friend at bedside, pt is currently more confused and showing more signs of anxiety and lability than pt's baseline                 Following commands: Impaired Following commands impaired: Follows one step commands with increased time, Follows multi-step commands inconsistently, Follows one step commands inconsistently     Cueing  General Comments   Cueing Techniques: Verbal cues;Tactile cues;Visual cues  VSS. Christine [name], present and supportive throughout session   Exercises     Shoulder Instructions      Home Living Family/patient expects to be discharged to:: Skilled nursing facility                                 Additional Comments:  Memory care      Prior Functioning/Environment Prior Level of Function : Needs assist             Mobility Comments: Per friend present at bedside, pt has been requiring +1 to +2 assist for transfers/mobility since hospitalization in 09/2023. ADLs Comments: Pt friend present at bedside, pt requiring Set up to Max assist for ADLs at baseline with functional level sometimes fluctuating from day to day.    OT Problem List: Decreased strength;Decreased activity tolerance;Decreased range of motion;Impaired balance (sitting and/or standing);Decreased cognition;Decreased safety awareness;Decreased knowledge of use of DME or AE   OT Treatment/Interventions: Self-care/ADL training;Therapeutic exercise;DME and/or AE instruction;Therapeutic activities;Cognitive remediation/compensation;Patient/family education;Balance training      OT Goals(Current goals can be found in the care plan section)   Acute Rehab OT Goals Patient Stated Goal: to go home OT Goal Formulation: Patient unable to participate in goal setting Time For Goal Achievement: 12/31/23 Potential to Achieve Goals: Fair ADL Goals Pt Will Perform Grooming: with set-up;with supervision;sitting (sitting EOB with Fair balance for 3 or more minutes) Pt Will Perform Upper Body Bathing: with min assist;sitting Pt Will Perform Upper Body Dressing: with min assist;sitting Pt Will Transfer to Toilet: with mod assist;bedside commode (step-pivot transfer with least restrictive AD) Pt Will Perform Toileting - Clothing Manipulation and hygiene: with max assist;sit to/from stand;sitting/lateral leans  OT Frequency:  Min 1X/week    Co-evaluation              AM-PAC OT 6 Clicks Daily Activity     Outcome Measure Help from another person eating meals?: A Little Help from another person taking care of personal grooming?: A Lot Help from another person toileting, which includes using toliet, bedpan, or urinal?: Total Help from another  person bathing (including washing, rinsing, drying)?: A Lot Help from another person to put on and taking off regular upper body clothing?: A Lot Help from another person to put on and taking off regular lower body clothing?: Total 6 Click Score: 11   End of Session Nurse Communication: Mobility status;Other (comment) (Family/friend has a question regarding pt urine output earlier this day)  Activity Tolerance: Other (comment);Patient tolerated treatment well (Pt limited by anxiety, restlessness, and cognitive level) Patient left: in bed;with call bell/phone within reach;with bed alarm set;with family/visitor present  OT Visit Diagnosis: Muscle weakness (generalized) (M62.81);History of falling (Z91.81);Other symptoms and signs involving cognitive function;Pain                Time: 8889-8871 OT Time Calculation (min): 18 min Charges:  OT General Charges $OT Visit: 1 Visit OT Evaluation $OT Eval Low Complexity: 1 Low  Christine Rockey HERO., OTR/L, MA Acute Rehab [phone]   Christine [name] 12/17/2023, 11:46 AM

## 2023-12-17 NOTE — Plan of Care (Signed)

## 2023-12-17 NOTE — TOC Transition Note (Signed)
 Transition of Care [facility]) - Discharge Note   Patient Details  Name: Christine [name] MRN: [id number] Date of Birth: 1933/04/13  Transition of Care [facility]) CM/SW Contact:  Christine JONELLE Joe, RN Phone Number: 12/17/2023, 12:07 PM   Clinical Narrative:    CM called and spoke with Christine [name], CM with [facility] and they plan to start services for home hospice at the facility today.  I called and spoke with Christine [name], CM at [facility] care and they are aware that patient is being discharged home today by [facility].  Christine [name], CM is aware that foley was removed yesterday and patient has voided after foley was removed and that patient is returning to the facility without a foley.  Discharge summary was completed by the MD.  Discharge summary and updated FL2 was sent to [facility], CM at [facility] at secure email [name].  I called the daughter and she is aware that patient will discharge back home by [facility]-   [facility] was provided authorization number for transportation - auth [id number].  Bedside nursing asked to call report to [place], CM at [facility] at [phone].   Final next level of care: Memory Care Barriers to Discharge: Barriers Resolved   Patient Goals and CMS Choice Patient states their goals for this hospitalization and ongoing recovery are:: Return to [facility]   Choice offered to / list presented to : Adult Children      Discharge Placement              Patient chooses bed at: Other - please specify in the comment section below: Patient to be transferred to facility by: [facility] to [facility] Name of family member notified: Christine [name]/daughter Patient and family notified of of transfer: 12/17/23  Discharge Plan and Services Additional resources added to the After Visit Summary for                                       Social Drivers of Health (SDOH) Interventions SDOH Screenings   Food Insecurity: No Food Insecurity (12/08/2023)   Housing: Unknown (12/08/2023)  Transportation Needs: Unmet Transportation Needs (12/08/2023)  Utilities: Not At Risk (12/08/2023)  Social Connections: Unknown (12/08/2023)  Tobacco Use: Low Risk (12/07/2023)     Readmission Risk Interventions    11/26/2023    2:47 PM  Readmission Risk Prevention Plan  Transportation Screening Complete  HRI or Home Care Consult Complete  Social Work Consult for Recovery Care Planning/Counseling Complete  Palliative Care Screening Not Applicable  Medication Review Oceanographer) Complete

## 2023-12-17 NOTE — Discharge Summary (Addendum)
 Physician Discharge Summary   Patient: Christine [name] MRN: [id number] DOB: 18-May-1933  Admit date:     12/07/2023  Discharge date: 12/17/2023  Discharge Physician: Deliliah Room   PCP: Shayne Anes, MD   Recommendations at discharge:    F/u with your PCP and urology in one week. Hospice care  Discharge Diagnoses: Principal Problem:   Acute encephalopathy Active Problems:   Paroxysmal atrial fibrillation (HCC)   Essential hypertension   Anemia   Dementia without behavioral disturbance (HCC)   Acute urinary retention   Nodule of apex of left lung   Prolonged QT interval    Hospital Course:   89 y.o. adult with medical history significant for dementia with behavioral disturbance, coronary artery disease, atrial fibrillation on Xarelto , recurrent UTIs, recurrent falls, and recent admission with urinary retention and bilateral hydronephrosis who now returns with inability to ambulate and increased confusion after another fall.  Patient was recent hospitalized, found to have urinary retention with bilateral hydronephrosis. She had Foley catheter placed, failed a voiding trial, and was discharged with Foley. PT had recommended that she be discharged to an SNF but she returned to her ALF at the daughter's request. She fell again shortly after discharge.  Acute metabolic encephalopathy with underlying dementia Presented with with worsening confusion.  Currently, appears to be back at baseline.  Likely multifactorial etiology given underlying dementia, urinary retention. Off sitter now.   Continue Namenda , Seroquel , Depakote . Delirium precautions.   Acute urinary retention with bilateral hydronephrosis Noted on CT scan.  continue Flomax   Need follow-up with urology in the outpatient setting.   Prolonged QT interval Avoid QT prolonging medications.   Paroxysmal atrial fibrillation Continue Xarelto  and diltiazem .   Physical debilitation/ muscle weakness SNF declined. Discussed  with daughter, she wishes her to return to memory care unit with hospice and rehab therapy.  [facility] is going to be the hospice provider.  Disposition: [facility] with hospice         Consultants: None Procedures performed: None  Disposition: Hospice care Diet recommendation:  Regular diet DISCHARGE MEDICATION: Allergies as of 12/17/2023       Reactions   Ambien  [zolpidem  Tartrate] Other (See Comments)    Hallucinations. Allergy not listed on MAR    Celexa [citalopram] Other (See Comments)   Per patient, made the inside and outside of skin burn. Allergy not listed on MAR    Cymbalta [duloxetine Hcl] Other (See Comments)   Allergy not listed on MAR    Ditropan Xl [oxybutynin Chloride] Nausea Only, Swelling, Other (See Comments)   Throat swelling, Burning eyelids. Allergy not listed on MAR    Lescol [fluvastatin] Other (See Comments)   Myalgias. Allergy not listed on MAR    Lipitor [atorvastatin] Other (See Comments)   Constipation, Hot flashes, Left-leaning gait. Allergy not listed on MAR    Lyrica  [pregabalin ] Other (See Comments)   Made patient feel funny. Allergy not listed on MAR    Neurontin [gabapentin] Other (See Comments)   Hallucinations. Allergy not listed on MAR    Pravastatin Other (See Comments)   Myalgias. Allergy not listed on MAR    Tape Other (See Comments)   Skin sensitive - tears and bruises easily. Allergy not listed on MAR    Zetia [ezetimibe] Other (See Comments)   Myalgias. Allergy not listed on MAR    Zocor [simvastatin] Other (See Comments)   Myalgias. Allergy not listed on [employer]         Medication  List     STOP taking these medications    amoxicillin  500 MG capsule Commonly known as: AMOXIL    losartan  25 MG tablet Commonly known as: COZAAR        TAKE these medications    acetaminophen  325 MG tablet Commonly known as: TYLENOL  Take 2 tablets (650 mg total) by mouth every 6 (six) hours as needed for  mild pain (pain score 1-3) (or Fever >/= 101).   diltiazem  120 MG 24 hr capsule Commonly known as: CARDIZEM  CD Take 1 capsule (120 mg total) by mouth daily.   divalproex  125 MG capsule Commonly known as: DEPAKOTE  SPRINKLE Take 125 mg by mouth 2 (two) times daily as needed (agitation).   donepezil  10 MG tablet Commonly known as: ARICEPT  Take 10 mg by mouth every evening.   feeding supplement Liqd Take 237 mLs by mouth 2 (two) times daily between meals.   hydrOXYzine  25 MG tablet Commonly known as: ATARAX  Take 1 tablet (25 mg total) by mouth at bedtime as needed. What changed: when to take this   LORazepam  0.5 MG tablet Commonly known as: ATIVAN  Take 1-2 tablets (0.5-1 mg total) by mouth 2 (two) times daily as needed for anxiety or sedation. What changed:  reasons to take this Another medication with the same name was removed. Continue taking this medication, and follow the directions you see here.   memantine  5 MG tablet Commonly known as: NAMENDA  Take 5 mg by mouth daily.   Myrbetriq  50 MG Tb24 tablet Generic drug: mirabegron  ER Take 50 mg by mouth daily.   pantoprazole  40 MG tablet Commonly known as: PROTONIX  Take 40 mg by mouth daily.   pentosan polysulfate 100 MG capsule Commonly known as: ELMIRON  Take 100 mg by mouth daily.   polyethylene glycol 17 g packet Commonly known as: MIRALAX  / GLYCOLAX  Take 17 g by mouth daily.   QUEtiapine  25 MG tablet Commonly known as: SEROQUEL  Take 25 mg by mouth at bedtime.   Rivaroxaban  15 MG Tabs tablet Commonly known as: XARELTO  Take 1 tablet (15 mg total) by mouth daily with supper. What changed: when to take this   tamsulosin  0.4 MG Caps capsule Commonly known as: FLOMAX  Take 1 capsule (0.4 mg total) by mouth daily. Start taking on: December 18, 2023   traMADol  50 MG tablet Commonly known as: ULTRAM  Take 50 mg by mouth 3 (three) times daily as needed for moderate pain (pain score 4-6) or severe pain (pain score  7-10).   Vitamin D3 25 MCG (1000 UT) Caps Take 1,000 Units by mouth daily.        Follow-up Information     [facility] Follow up.   Why: [facility] with provide hospice at the facility.        Shayne Anes, MD. Schedule an appointment as soon as possible for a visit in 1 week(s).   Specialty: Internal Medicine Contact information: [address] [place] [address] [phone]                Discharge Exam: Fredricka Weights   12/12/23 0009  Weight: 54.6 kg   General - Elderly Caucasian female, no apparent distress HEENT - PERRLA, EOMI, atraumatic head, non tender sinuses. Lung - Clear, no rales, rhonchi, wheezes. Heart - S1, S2 heard, no murmurs, rubs, no pedal edema. Abdomen - Soft, non tender, bowel sounds good, foley intact Neuro - Alert, awake and follows simple commands, non focal exam. Skin - Warm and dry.  Condition at  discharge: good  The results of significant diagnostics from this hospitalization (including imaging, microbiology, ancillary and laboratory) are listed below for reference.   Imaging Studies: DG Hand 2 View Right Result Date: 12/07/2023 CLINICAL DATA:  Fall.  Right hand pain. EXAM: RIGHT HAND - 2 VIEW COMPARISON:  None Available. FINDINGS: There is no evidence of acute fracture or dislocation. Severe osteoarthritis of the interphalangeal joints of the second through fifth fingers. No bony lesions or destruction. Soft tissues are unremarkable. IMPRESSION: No acute findings. Severe osteoarthritis of the interphalangeal joints of the second through fifth fingers. Electronically Signed   By: Marcey Moan M.D.   On: 12/07/2023 18:05   CT CHEST ABDOMEN PELVIS W CONTRAST Result Date: 12/07/2023 CLINICAL DATA:  Blunt trauma. EXAM: CT CHEST, ABDOMEN, AND PELVIS WITH CONTRAST TECHNIQUE: Multidetector CT imaging of the chest, abdomen and pelvis was performed following the standard protocol during bolus administration of  intravenous contrast. RADIATION DOSE REDUCTION: This exam was performed according to the departmental dose-optimization program which includes automated exposure control, adjustment of the mA and/or kV according to patient size and/or use of iterative reconstruction technique. CONTRAST:  75mL OMNIPAQUE  IOHEXOL  350 MG/ML SOLN COMPARISON:  Abdominopelvic CT 11/24/2023 FINDINGS: CT CHEST FINDINGS Cardiovascular: No evidence of acute aortic or vascular injury. Aortic atherosclerosis. Upper normal heart size. There are coronary artery calcifications. No pericardial effusion. Mediastinum/Nodes: No mediastinal hemorrhage. No adenopathy. Unremarkable esophagus. No visible thyroid  nodule. Lungs/Pleura: No pneumothorax. Dependent atelectasis in both lower lobes, left greater than right. Trace left pleural thickening. Benign calcified granuloma in the left lower lobe. Irregular part solid left apical pulmonary nodule with ill-defined margins. Solid component measures approximately 7 x 5 x 10 mm, series 4, image 35. Breathing motion artifact. Musculoskeletal: No acute fracture of the ribs, included clavicles or shoulder girdles. Mild scoliotic curvature of the thoracic spine, no acute fracture. CT ABDOMEN PELVIS FINDINGS Hepatobiliary: No hepatic injury or perihepatic hematoma. Scattered hepatic cysts, stable from prior exam. Gallbladder is distended without inflammation. Pancreas: No evidence of injury. No ductal dilatation or inflammation. Spleen: No splenic injury or perisplenic hematoma. Adrenals/Urinary Tract: No adrenal hemorrhage or renal injury identified. Improved hydronephrosis from prior exam with persistent dilatation of the renal collecting systems. Improved hydroureter, near completely resolved. Redemonstrated right renal cyst. Nonobstructing left renal calculi. No further follow-up imaging is recommended. Foley catheter within the urinary bladder which is only minimally distended. Air in the bladder typically  related to Foley. Stomach/Bowel: No evidence of bowel injury. No mesenteric hematoma. No bowel wall thickening or inflammation. Colonic diverticulosis without diverticulitis. Moderate colonic stool burden. Normal appendix. Vascular/Lymphatic: No vascular injury. No retroperitoneal fluid. Abdominal aortic atherosclerosis. No suspicious lymphadenopathy. Reproductive: Status post hysterectomy. No adnexal masses. Other: No free fluid or free air. Musculoskeletal: Left hip arthroplasty creates regional streak artifact. Degenerative change in the lumbar spine without acute fracture. No pelvic fracture, with special attention to the left pubic bone with questionable lucency on radiograph. IMPRESSION: 1. No evidence of acute traumatic injury to the chest, abdomen, or pelvis. 2. Irregular part solid left apical pulmonary nodule with ill-defined margins. Solid component measures approximately 7 x 5 x 10 mm. Differential considerations include scarring or neoplasm. Recommend elective pulmonary consultation. 3. Improved hydronephrosis from prior exam with persistent dilatation of the renal collecting systems. Improved hydroureter, near completely resolved. 4. Colonic diverticulosis without diverticulitis. Aortic Atherosclerosis (ICD10-I70.0). Electronically Signed   By: Andrea Gasman M.D.   On: 12/07/2023 16:09   DG Pelvis 1-2 Views Result Date:  12/07/2023 EXAM: 1 or 2 VIEW(S) XRAY OF THE PELVIS 12/07/2023 12:21:00 PM COMPARISON: None available. CLINICAL HISTORY: 809823 Fall [id number] FINDINGS: BONES AND JOINTS: Left hip arthroplasty noted. Pubic symphysis degenerative spurring. A vertical lucency projects over the left pubic bone. If the patient has point tenderness along the pubis slightly eccentric to the left, then consider dedicated CT pelvis to further assess for pubic bone or pelvic fracture. No joint dislocation. SOFT TISSUES: The soft tissues are unremarkable. IMPRESSION: 1. Vertical lucency over the left pubic  bone; if there is point tenderness to palpation along the pubis slightly eccentric to the left, consider dedicated CT pelvis to further assess for pubic or pelvic fracture. 2. Pubic symphysis degenerative spurring. 3. Left hip arthroplasty. Electronically signed by: Ryan Salvage MD 12/07/2023 01:04 PM EST RP Workstation: [id number]   DG Shoulder Right Result Date: 12/07/2023 EXAM: 1 VIEW(S) XRAY OF THE RIGHT SHOULDER 12/07/2023 12:21:00 PM COMPARISON: None available. CLINICAL HISTORY: fall, right arm pain. FINDINGS: BONES AND JOINTS: Glenohumeral joint is normally aligned. No acute fracture or dislocation. The [facility] joint is unremarkable in appearance. SOFT TISSUES: No abnormal calcifications. Visualized lung is unremarkable. IMPRESSION: 1. No acute fracture or dislocation. Electronically signed by: Ryan Salvage MD 12/07/2023 01:01 PM EST RP Workstation: [id number]   DG Foot Complete Left Result Date: 12/07/2023 EXAM: 3 OR MORE VIEW(S) XRAY OF THE LEFT FOOT 12/07/2023 12:21:00 PM COMPARISON: None available. CLINICAL HISTORY: fall FINDINGS: BONES AND JOINTS: No acute fracture. No focal osseous lesion. No joint dislocation. Mild hallux valgus deformity. Osteoarthritis. SOFT TISSUES: Mild soft tissue swelling dorsally along the distal forefoot. IMPRESSION: 1. No acute fracture or dislocation. 2. Mild hallux valgus deformity and osteoarthritis. 3. Mild soft tissue swelling dorsally along the distal forefoot. Electronically signed by: Ryan Salvage MD 12/07/2023 01:00 PM EST RP Workstation: [id number]   DG Elbow Complete Right Result Date: 12/07/2023 EXAM: 3 VIEW(S) XRAY OF THE ELBOW COMPARISON: None available. CLINICAL HISTORY: fall FINDINGS: BONES AND JOINTS: No acute fracture. No focal osseous lesion. No joint dislocation. No joint effusion. SOFT TISSUES: The soft tissues are unremarkable. IMPRESSION: 1. No acute abnormality. Electronically signed by: Ryan Salvage MD 12/07/2023 12:59 PM EST RP  Workstation: [id number]   DG Chest 1 View Result Date: 12/07/2023 EXAM: 1 VIEW(S) XRAY OF THE CHEST 12/07/2023 12:21:00 PM COMPARISON: 11/24/2023 CLINICAL HISTORY: Fall [id number] FINDINGS: LUNGS AND PLEURA: Leftward rotated frontal projection. Trace left pleural effusion. Left retrocardiac basilar airspace opacity could reflect atelectasis or pneumonia, follow up chest radiography recommended to ensure clearance after treatment. No pneumothorax. HEART AND MEDIASTINUM: Atherosclerotic calcifications. BONES AND SOFT TISSUES: No acute osseous abnormality. IMPRESSION: 1. Left retrocardiac basilar airspace opacity, possibly atelectasis or pneumonia, with follow-up chest radiography recommended to ensure clearance after treatment. 2. Trace left pleural effusion. Electronically signed by: Ryan Salvage MD 12/07/2023 12:58 PM EST RP Workstation: [id number]   CT Head Wo Contrast Result Date: 12/07/2023 CLINICAL DATA:  Mental status change.  Fall. EXAM: CT HEAD WITHOUT CONTRAST CT CERVICAL SPINE WITHOUT CONTRAST TECHNIQUE: Multidetector CT imaging of the head and cervical spine was performed following the standard protocol without intravenous contrast. Multiplanar CT image reconstructions of the cervical spine were also generated. RADIATION DOSE REDUCTION: This exam was performed according to the departmental dose-optimization program which includes automated exposure control, adjustment of the mA and/or kV according to patient size and/or use of iterative reconstruction technique. COMPARISON:  11/24/2023, 11/07/2023 FINDINGS: CT HEAD FINDINGS Brain: There is mild age related atrophic changes the ventricles, cisterns and other  CSF spaces are otherwise unchanged. Mild chronic ischemic microvascular disease is present. No mass, mass effect, shift of midline structures or acute hemorrhage. Vascular: No hyperdense vessel or unexpected calcification. Skull: Normal. Negative for fracture or focal lesion. Sinuses/Orbits:  Orbits are normal. Paranasal sinuses are well developed and are clear. Mastoid air cells are clear. Other: None. CT CERVICAL SPINE FINDINGS Alignment: No posttraumatic subluxation. Skull base and vertebrae: Mild spondylosis of the cervical spine to include uncovertebral joint spurring and facet arthropathy. Vertebral body heights within the cervical spine are maintained. Minimal left-sided neural foraminal narrowing at the C5-6 level mild bilateral neural foraminal narrowing at the C6-7 level. No acute fracture. Soft tissues and spinal canal: Soft tissues are normal. Borderline canal stenosis at the C6-7 level. Disc levels: Disc space narrowing from the C4-5 level to the C6-7 level. Upper chest: No acute findings. Stable 1.2 cm subsolid nodule over the left apex. Other: None. IMPRESSION: 1. No acute brain injury. 2. Mild age related atrophic changes and chronic ischemic microvascular disease. 3. No acute cervical spine injury. 4. Mild spondylosis of the cervical spine with disc disease from the C4-5 level to the C6-7 level. Minimal left-sided neural foraminal narrowing at the C5-6 level and mild bilateral neural foraminal narrowing at the C6-7 level. Borderline canal stenosis at the C6-7 level. 5. Stable 1.2 cm subsolid nodule over the left apex. Continues to recommend further evaluation with noncontrast chest CT as previously recommended. Electronically Signed   By: Toribio Agreste M.D.   On: 12/07/2023 12:01   CT Cervical Spine Wo Contrast Result Date: 12/07/2023 CLINICAL DATA:  Mental status change.  Fall. EXAM: CT HEAD WITHOUT CONTRAST CT CERVICAL SPINE WITHOUT CONTRAST TECHNIQUE: Multidetector CT imaging of the head and cervical spine was performed following the standard protocol without intravenous contrast. Multiplanar CT image reconstructions of the cervical spine were also generated. RADIATION DOSE REDUCTION: This exam was performed according to the departmental dose-optimization program which includes  automated exposure control, adjustment of the mA and/or kV according to patient size and/or use of iterative reconstruction technique. COMPARISON:  11/24/2023, 11/07/2023 FINDINGS: CT HEAD FINDINGS Brain: There is mild age related atrophic changes the ventricles, cisterns and other CSF spaces are otherwise unchanged. Mild chronic ischemic microvascular disease is present. No mass, mass effect, shift of midline structures or acute hemorrhage. Vascular: No hyperdense vessel or unexpected calcification. Skull: Normal. Negative for fracture or focal lesion. Sinuses/Orbits: Orbits are normal. Paranasal sinuses are well developed and are clear. Mastoid air cells are clear. Other: None. CT CERVICAL SPINE FINDINGS Alignment: No posttraumatic subluxation. Skull base and vertebrae: Mild spondylosis of the cervical spine to include uncovertebral joint spurring and facet arthropathy. Vertebral body heights within the cervical spine are maintained. Minimal left-sided neural foraminal narrowing at the C5-6 level mild bilateral neural foraminal narrowing at the C6-7 level. No acute fracture. Soft tissues and spinal canal: Soft tissues are normal. Borderline canal stenosis at the C6-7 level. Disc levels: Disc space narrowing from the C4-5 level to the C6-7 level. Upper chest: No acute findings. Stable 1.2 cm subsolid nodule over the left apex. Other: None. IMPRESSION: 1. No acute brain injury. 2. Mild age related atrophic changes and chronic ischemic microvascular disease. 3. No acute cervical spine injury. 4. Mild spondylosis of the cervical spine with disc disease from the C4-5 level to the C6-7 level. Minimal left-sided neural foraminal narrowing at the C5-6 level and mild bilateral neural foraminal narrowing at the C6-7 level. Borderline canal stenosis at the C6-7  level. 5. Stable 1.2 cm subsolid nodule over the left apex. Continues to recommend further evaluation with noncontrast chest CT as previously recommended.  Electronically Signed   By: Toribio Agreste M.D.   On: 12/07/2023 12:01   DG CHEST PORT 1 VIEW Result Date: 11/24/2023 EXAM: 1 VIEW(S) XRAY OF THE CHEST 11/24/2023 08:46:00 PM COMPARISON: 11/07/2023 CLINICAL HISTORY: Confusion FINDINGS: LUNGS AND PLEURA: Left basilar patchy opacity. Trace left pleural effusion. No pneumothorax. HEART AND MEDIASTINUM: Atherosclerotic calcifications. No acute abnormality of the cardiac and mediastinal silhouettes. BONES AND SOFT TISSUES: No acute osseous abnormality. IMPRESSION: 1. Left basilar patchy opacity, which may reflect infection or atelectasis. 2. Trace left pleural effusion. Electronically signed by: Morgane Naveau MD 11/24/2023 08:54 PM EST RP Workstation: [id number]   CT ABDOMEN PELVIS W CONTRAST Result Date: 11/24/2023 EXAM: CT ABDOMEN AND PELVIS WITH CONTRAST 11/24/2023 04:26:51 PM TECHNIQUE: CT of the abdomen and pelvis was performed with the administration of 80 mL iohexol  (OMNIPAQUE ) 300 MG/ML solution. Multiplanar reformatted images are provided for review. Automated exposure control, iterative reconstruction, and/or weight-based adjustment of the mA/kV was utilized to reduce the radiation dose to as low as reasonably achievable. COMPARISON: None available. CLINICAL HISTORY: Abdominal pain, confusion; fall; inability to walk FINDINGS: LOWER CHEST: Coronary and descending thoracic aortic atherosclerotic vascular calcifications. Calcified granuloma posteriorly in the left lower lobe. Mild dependent atelectasis in both lower lobes. Mild cardiomegaly. LIVER: Stable hypodense liver lesions, likely cysts, warranting no further imaging workup. GALLBLADDER AND BILE DUCTS: Gallbladder is unremarkable. No biliary ductal dilatation. SPLEEN: No acute abnormality. PANCREAS: No acute abnormality. ADRENAL GLANDS: No acute abnormality. KIDNEYS, URETERS AND BLADDER: 2.7 cm simple right mid kidney cyst warrants no further imaging workup. Prominent bilateral hydronephrosis and  moderate bilateral hydroureter extending down to the distended urinary bladder. The urinary bladder measures 17.2 x 10.8 x 11.4 cm, with a calculated volume of 1109 ml. Streak artifact in the patient's left hip implant partially obscures the vicinity of the urethra. The appearance raises concern for possible bladder outlet or urethral obstruction. No urinary tract calculi identified. No perinephric or periureteral stranding. GI AND BOWEL: Stomach demonstrates no acute abnormality. There is no bowel obstruction. No current findings of bowel distress such as pneumatosis, bowel wall thickening, or mesenteric edema. Low position of the anorectal junction suggesting pelvic floor laxity. PERITONEUM AND RETROPERITONEUM: No ascites. No free air. VASCULATURE: Considerable abdominal aortic atheromatous vascular calcification noted. Substantial atheromatous plaque severely narrowing the proximal superior mesenteric artery; poor visualization of the proximal celiac trunk which is likely occluded, with reconstitution via collaterals from the superior mesenteric artery. This predisposes the patient to mesenteric ischemia including chronic mesenteric ischemia. LYMPH NODES: No lymphadenopathy. REPRODUCTIVE ORGANS: Uterus absent. The possibility of mucosal thickening along the vaginal vestibule is not excluded. BONES AND SOFT TISSUES: Loss of disc height at L4-L5 with lower lumbar spondylosis. 3 mm grade 1 degenerative anterolisthesis at L5-S1. Left total hip prosthesis. No focal soft tissue abnormality. IMPRESSION: 1. Prominent bilateral hydronephrosis and moderate bilateral hydroureter to a markedly distended urinary bladder, concerning for bladder outlet or urethral obstruction; no urinary tract calculi identified. 2. 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 3.  Loss of disc height at L4-L5 with lower lumbar spondylosis. 4. Grade 1 degenerative anterolisthesis at L5-S1. Electronically signed by: Ryan Salvage MD 11/24/2023 05:03 PM EST RP Workstation: [id number]   CT Head Wo Contrast Result Date:  11/24/2023 EXAM: CT HEAD WITHOUT CONTRAST 11/24/2023 04:26:51 PM TECHNIQUE: CT of the head was performed without the administration of intravenous contrast. Automated exposure control, iterative reconstruction, and/or weight based adjustment of the mA/kV was utilized to reduce the radiation dose to as low as reasonably achievable. COMPARISON: CT head without contrast 11/07/2023. MR head without contrast 11/07/2023. CLINICAL HISTORY: Delirium FINDINGS: BRAIN AND VENTRICLES: Moderate generalized cerebral and cerebellar volume loss. Moderate periventricular and deep cerebral white matter disease. Atherosclerotic calcifications in cavernous internal carotid arteries. No acute hemorrhage. No evidence of acute infarct. No hydrocephalus. No extra-axial collection. No mass effect or midline shift. ORBITS: Bilateral lens replacement. SINUSES: Mucosal thickening in bilateral ethmoid sinuses. Trace left mastoid effusion. SOFT TISSUES AND SKULL: No acute soft tissue abnormality. No skull fracture. IMPRESSION: 1. No acute intracranial abnormality. 2. Stable moderate  generalized cerebral and cerebellar volume loss. 3. Stable moderate periventricular and deep cerebral white matter disease. Electronically signed by: Lonni Necessary MD 11/24/2023 04:44 PM EST RP Workstation: [id number]   CT Cervical Spine Wo Contrast Result Date: 11/24/2023 EXAM: CT CERVICAL SPINE WITH CONTRAST 11/24/2023 04:26:51 PM TECHNIQUE: CT of the cervical spine was performed with the administration of 80 mL of iohexol  (OMNIPAQUE ) 300 MG/ML solution. Multiplanar reformatted images are provided for review. Automated exposure control, iterative reconstruction, and/or weight based adjustment of the mA/kV was  utilized to reduce the radiation dose to as low as reasonably achievable. COMPARISON: CT cervical spine 11/30/2023. CLINICAL HISTORY: Neck trauma (Age >= 65y); reports leg pain. Fall. Increased confusion. FINDINGS: CERVICAL SPINE: BONES AND ALIGNMENT: No acute fracture or traumatic malalignment. Ankylosis of the cervical spine is present at C2-C3 and C4-C5. Slight degenerative anterolisthesis present at C4-C5. Straightening of the normal cervical lordosis is present. DEGENERATIVE CHANGES: Ankylosis of the cervical spine is present at C2-C3 and C4-C5. Slight degenerative anterolisthesis present at C4-C5. Straightening of the normal cervical lordosis is present. SOFT TISSUES: No prevertebral soft tissue swelling. Atherosclerotic calcifications are present at the carotid bifurcations bilaterally without significant stenosis. IMPRESSION: 1. No acute abnormality of the cervical spine related to the reported neck trauma. 2. Ankylosis of the cervical spine at C2-3 and C4-5 with slight degenerative anterolisthesis at C4-5. 3. Straightening of the normal cervical lordosis. Electronically signed by: Lonni Necessary MD 11/24/2023 04:42 PM EST RP Workstation: [id number]   VAS US  LOWER EXTREMITY VENOUS (DVT) (ONLY [facility] & [facility]) Result Date: 11/24/2023  Lower Venous DVT Study Patient Name:  NOVIE MAGGIO Rey  Date of Exam:   11/24/2023 Medical Rec #: [id number]        Accession #:    [id number] Date of Birth: [birth year]        Patient Gender: F Patient Age:   73 years Exam Location:  [facility] Procedure:      VAS US  LOWER EXTREMITY VENOUS (DVT) Referring Phys: ROCKY MASSY --------------------------------------------------------------------------------  Indications: Pain.  Risk Factors: None identified. Limitations: Poor ultrasound/tissue interface and patient positioning, patient immobility, patient pain tolerance. Comparison Study: No prior studies. Performing Technologist: Cordella Collet RVT  Examination  Guidelines: A complete evaluation includes B-mode imaging, spectral Doppler, color Doppler, and power Doppler as needed of all accessible portions of each vessel. Bilateral testing is considered an integral part of a complete examination. Limited examinations for reoccurring indications may be performed as noted. The reflux portion of the exam is performed with the patient in reverse Trendelenburg.  +---------+---------------+---------+-----------+----------+--------------+ RIGHT    CompressibilityPhasicitySpontaneityPropertiesThrombus Aging +---------+---------------+---------+-----------+----------+--------------+ CFV      Full  Yes      Yes                                 +---------+---------------+---------+-----------+----------+--------------+ SFJ      Full                                                        +---------+---------------+---------+-----------+----------+--------------+ FV Prox  Full                                                        +---------+---------------+---------+-----------+----------+--------------+ FV Mid   Full                                                        +---------+---------------+---------+-----------+----------+--------------+ FV Distal               Yes      Yes                                 +---------+---------------+---------+-----------+----------+--------------+ PFV      Full                                                        +---------+---------------+---------+-----------+----------+--------------+ POP      Full           Yes      Yes                                 +---------+---------------+---------+-----------+----------+--------------+ PTV      Full                                                        +---------+---------------+---------+-----------+----------+--------------+ PERO     Full                                                         +---------+---------------+---------+-----------+----------+--------------+   +---------+---------------+---------+-----------+----------+-------------------+ LEFT     CompressibilityPhasicitySpontaneityPropertiesThrombus Aging      +---------+---------------+---------+-----------+----------+-------------------+ CFV      Full           Yes      Yes                                      +---------+---------------+---------+-----------+----------+-------------------+  SFJ      Full                                                             +---------+---------------+---------+-----------+----------+-------------------+ FV Prox  Full                                                             +---------+---------------+---------+-----------+----------+-------------------+ FV Mid   Full                                                             +---------+---------------+---------+-----------+----------+-------------------+ FV Distal               Yes      Yes                                      +---------+---------------+---------+-----------+----------+-------------------+ PFV      Full                                                             +---------+---------------+---------+-----------+----------+-------------------+ POP      Full           Yes      Yes                                      +---------+---------------+---------+-----------+----------+-------------------+ PTV      Full                                                             +---------+---------------+---------+-----------+----------+-------------------+ PERO                                                  Not well visualized +---------+---------------+---------+-----------+----------+-------------------+     Summary: RIGHT: - There is no evidence of deep vein thrombosis in the lower extremity. However, portions of this examination were limited- see technologist comments  above.  - No cystic structure found in the popliteal fossa.  LEFT: - There is no evidence of deep vein thrombosis in the lower extremity. However, portions of this examination were limited- see technologist comments above.  - No cystic structure found in the popliteal fossa.  *See table(s) above for measurements and observations. Electronically signed by  Lonni Gaskins MD on 11/24/2023 at 4:29:06 PM.    Final     Microbiology: Results for orders placed or performed during the hospital encounter of 12/07/23  Resp panel by RT-PCR (RSV, Flu A&B, Covid) Anterior Nasal Swab     Status: None   Collection Time: 12/07/23 11:11 AM   Specimen: Anterior Nasal Swab  Result Value Ref Range Status   SARS Coronavirus 2 by RT PCR NEGATIVE NEGATIVE Final   Influenza A by PCR NEGATIVE NEGATIVE Final   Influenza B by PCR NEGATIVE NEGATIVE Final    Comment: (NOTE) The Xpert Xpress SARS-CoV-2/FLU/RSV plus assay is intended as an aid in the diagnosis of influenza from Nasopharyngeal swab specimens and should not be used as a sole basis for treatment. Nasal washings and aspirates are unacceptable for Xpert Xpress SARS-CoV-2/FLU/RSV testing.  Fact Sheet for Patients: bloggercourse.com  Fact Sheet for Healthcare Providers: seriousbroker.it  This test is not yet approved or cleared by the [place]  FDA and has been authorized for detection and/or diagnosis of SARS-CoV-2 by FDA under an Emergency Use Authorization (EUA). This EUA will remain in effect (meaning this test can be used) for the duration of the COVID-19 declaration under Section 564(b)(1) of the Act, 21 U.S.C. section 360bbb-3(b)(1), unless the authorization is terminated or revoked.     Resp Syncytial Virus by PCR NEGATIVE NEGATIVE Final    Comment: (NOTE) Fact Sheet for Patients: bloggercourse.com  Fact Sheet for Healthcare  Providers: seriousbroker.it  This test is not yet approved or cleared by the [place]  FDA and has been authorized for detection and/or diagnosis of SARS-CoV-2 by FDA under an Emergency Use Authorization (EUA). This EUA will remain in effect (meaning this test can be used) for the duration of the COVID-19 declaration under Section 564(b)(1) of the Act, 21 U.S.C. section 360bbb-3(b)(1), unless the authorization is terminated or revoked.  Performed at [facility], [address] [place], [place] [address]   Culture, blood (routine x 2)     Status: None   Collection Time: 12/07/23  1:28 PM   Specimen: BLOOD RIGHT HAND  Result Value Ref Range Status   Specimen Description BLOOD RIGHT HAND  Final   Special Requests   Final    BOTTLES DRAWN AEROBIC ONLY Blood Culture results may not be optimal due to an inadequate volume of blood received in culture bottles   Culture   Final    NO GROWTH 5 DAYS Performed at [facility], [address], [place], [place] [address]    Report Status 12/12/2023 FINAL  Final  Culture, blood (routine x 2)     Status: None   Collection Time: 12/07/23  1:28 PM   Specimen: BLOOD LEFT ARM  Result Value Ref Range Status   Specimen Description BLOOD LEFT ARM  Final   Special Requests   Final    BOTTLES DRAWN AEROBIC AND ANAEROBIC Blood Culture adequate volume   Culture   Final    NO GROWTH 5 DAYS Performed at [facility], [address] [place], [place] [address]    Report Status 12/12/2023 FINAL  Final    Labs: CBC: Recent Labs  Lab 12/11/23 0604 12/16/23 0516  WBC 3.4* 4.4  HGB 9.0* 10.3*  HCT 27.8* 31.4*  MCV 87.1 86.5  PLT 226 280   Basic Metabolic Panel: Recent Labs  Lab 12/11/23 0604 12/16/23 0516  NA 139 137  K 4.0 3.5  CL 109 105  CO2 23  23  GLUCOSE 92 96  BUN 16 19  CREATININE 0.69 0.81  CALCIUM  8.1* 8.8*   Liver Function Tests: No results for input(s): AST, ALT,  ALKPHOS, BILITOT, PROT, ALBUMIN in the last 168 hours. CBG: No results for input(s): GLUCAP in the last 168 hours.  Discharge time spent: 40 minutes.  Signed: Deliliah Room, MD [facility] 12/17/2023

## 2023-12-17 NOTE — TOC Transition Note (Addendum)
 Transition of Care [facility]) - Discharge Note   Patient Details  Name: Christine [name] MRN: [id number] Date of Birth: January 30, 1933  [facility] CM/SW Contact:  Sherline Clack, LCSWA Phone Number: 12/17/2023, 12:03 PM   Clinical Narrative:     Patient will DC to: [facility] Anticipated DC date: 12/17/2023  Family notified: [name] Adams/daughter Transport by: ROME barrows ID: [id number]   Per MD patient ready for DC to [facility]. RN, patient, patient's family, and facility notified of DC. Discharge Summary and FL2 sent to facility. DC packet on chart. Ambulance transport requested for patient.   CSW will sign off for now as social work intervention is no longer needed. Please consult us  again if new needs arise.    Final next level of care: Memory Care Barriers to Discharge: Barriers Resolved   Patient Goals and CMS Choice Patient states their goals for this hospitalization and ongoing recovery are:: Return to [facility]   Choice offered to / list presented to : Adult Children      Discharge Placement              Patient chooses bed at: Other - please specify in the comment section below: Patient to be transferred to facility by: [facility] to [facility] Name of family member notified: [name] Adams/daughter Patient and family notified of of transfer: 12/17/23  Discharge Plan and Services Additional resources added to the After Visit Summary for                                       Social Drivers of Health (SDOH) Interventions SDOH Screenings   Food Insecurity: No Food Insecurity (12/08/2023)  Housing: Unknown (12/08/2023)  Transportation Needs: Unmet Transportation Needs (12/08/2023)  Utilities: Not At Risk (12/08/2023)  Social Connections: Unknown (12/08/2023)  Tobacco Use: Low Risk (12/07/2023)     Readmission Risk Interventions    11/26/2023    2:47 PM  Readmission Risk Prevention Plan  Transportation Screening Complete  HRI or Home  Care Consult Complete  Social Work Consult for Recovery Care Planning/Counseling Complete  Palliative Care Screening Not Applicable  Medication Review Oceanographer) Complete

## 2024-01-06 DEATH — deceased

## 2024-04-03 ENCOUNTER — Inpatient Hospital Stay (HOSPITAL_COMMUNITY): Admission: RE | Admit: 2024-04-03 | Source: Ambulatory Visit
